# Patient Record
Sex: Male | Born: 1955 | Race: White | Hispanic: No | Smoking: Never smoker
Health system: Southern US, Community
[De-identification: ages and names within clinical notes are randomized; demographics above are authoritative.]

## PROBLEM LIST (undated history)

## (undated) DIAGNOSIS — E119 Type 2 diabetes mellitus without complications: Secondary | ICD-10-CM

## (undated) DIAGNOSIS — I10 Essential (primary) hypertension: Secondary | ICD-10-CM

## (undated) DIAGNOSIS — Z9989 Dependence on other enabling machines and devices: Secondary | ICD-10-CM

## (undated) DIAGNOSIS — G4733 Obstructive sleep apnea (adult) (pediatric): Secondary | ICD-10-CM

## (undated) DIAGNOSIS — E876 Hypokalemia: Secondary | ICD-10-CM

## (undated) DIAGNOSIS — N182 Chronic kidney disease, stage 2 (mild): Secondary | ICD-10-CM

## (undated) DIAGNOSIS — K219 Gastro-esophageal reflux disease without esophagitis: Secondary | ICD-10-CM

## (undated) DIAGNOSIS — E559 Vitamin D deficiency, unspecified: Secondary | ICD-10-CM

## (undated) DIAGNOSIS — E785 Hyperlipidemia, unspecified: Secondary | ICD-10-CM

## (undated) HISTORY — DX: Hypomagnesemia: E83.42

## (undated) HISTORY — DX: Chronic kidney disease, stage 2 (mild): N18.2

## (undated) HISTORY — DX: Type 2 diabetes mellitus without complications: E11.9

## (undated) HISTORY — DX: Dependence on other enabling machines and devices: Z99.89

## (undated) HISTORY — DX: Vitamin D deficiency, unspecified: E55.9

## (undated) HISTORY — DX: Hypokalemia: E87.6

## (undated) HISTORY — DX: Essential (primary) hypertension: I10

## (undated) HISTORY — PX: TONSILLECTOMY: SUR1361

## (undated) HISTORY — DX: Obstructive sleep apnea (adult) (pediatric): G47.33

## (undated) HISTORY — DX: Hyperlipidemia, unspecified: E78.5

## (undated) HISTORY — DX: Gastro-esophageal reflux disease without esophagitis: K21.9

---

## 1985-03-03 DIAGNOSIS — E119 Type 2 diabetes mellitus without complications: Secondary | ICD-10-CM

## 1985-03-03 HISTORY — DX: Type 2 diabetes mellitus without complications: E11.9

## 2000-05-28 ENCOUNTER — Ambulatory Visit (HOSPITAL_BASED_OUTPATIENT_CLINIC_OR_DEPARTMENT_OTHER): Admission: RE | Admit: 2000-05-28 | Discharge: 2000-05-28 | Payer: Self-pay | Admitting: Otolaryngology

## 2000-10-15 ENCOUNTER — Encounter: Payer: Self-pay | Admitting: General Surgery

## 2000-10-16 ENCOUNTER — Observation Stay (HOSPITAL_COMMUNITY): Admission: RE | Admit: 2000-10-16 | Discharge: 2000-10-17 | Payer: Self-pay | Admitting: General Surgery

## 2005-03-03 HISTORY — PX: ANAL FISSURE REPAIR: SHX2312

## 2005-03-10 ENCOUNTER — Encounter: Admission: RE | Admit: 2005-03-10 | Discharge: 2005-03-10 | Payer: Self-pay | Admitting: Endocrinology

## 2011-11-18 ENCOUNTER — Ambulatory Visit (HOSPITAL_COMMUNITY)
Admission: RE | Admit: 2011-11-18 | Discharge: 2011-11-18 | Disposition: A | Discharge status: Home / Self Care | Payer: PRIVATE HEALTH INSURANCE | Source: Ambulatory Visit | Attending: Internal Medicine | Admitting: Internal Medicine

## 2011-11-18 ENCOUNTER — Other Ambulatory Visit (HOSPITAL_COMMUNITY): Payer: Self-pay | Admitting: Internal Medicine

## 2011-11-18 DIAGNOSIS — IMO0002 Reserved for concepts with insufficient information to code with codable children: Secondary | ICD-10-CM

## 2011-11-18 DIAGNOSIS — R109 Unspecified abdominal pain: Secondary | ICD-10-CM | POA: Insufficient documentation

## 2011-11-18 DIAGNOSIS — M25559 Pain in unspecified hip: Secondary | ICD-10-CM | POA: Insufficient documentation

## 2011-12-29 ENCOUNTER — Ambulatory Visit (INDEPENDENT_AMBULATORY_CARE_PROVIDER_SITE_OTHER): Payer: PRIVATE HEALTH INSURANCE | Admitting: Surgery

## 2012-02-04 ENCOUNTER — Ambulatory Visit (INDEPENDENT_AMBULATORY_CARE_PROVIDER_SITE_OTHER): Payer: PRIVATE HEALTH INSURANCE | Admitting: Surgery

## 2012-02-04 ENCOUNTER — Encounter (INDEPENDENT_AMBULATORY_CARE_PROVIDER_SITE_OTHER): Payer: Self-pay | Admitting: Surgery

## 2012-02-04 VITALS — BP 124/82 | HR 61 | Temp 97.6°F | Ht 73.5 in | Wt 263.4 lb

## 2012-02-04 DIAGNOSIS — M6208 Separation of muscle (nontraumatic), other site: Secondary | ICD-10-CM

## 2012-02-04 DIAGNOSIS — E669 Obesity, unspecified: Secondary | ICD-10-CM

## 2012-02-04 DIAGNOSIS — K439 Ventral hernia without obstruction or gangrene: Secondary | ICD-10-CM

## 2012-02-04 NOTE — Patient Instructions (Signed)
See the Handout(s) we gave you.  Consider surgery.  Please call our office at [phone] if you wish to schedule surgery or if you have further questions / concerns.   Hernia A hernia occurs when an internal organ pushes out through a weak spot in the abdominal wall. Hernias most commonly occur in the groin and around the navel. Hernias often can be pushed back into place (reduced). Most hernias tend to get worse over time. Some abdominal hernias can get stuck in the opening (irreducible or incarcerated hernia) and cannot be reduced. An irreducible abdominal hernia which is tightly squeezed into the opening is at risk for impaired blood supply (strangulated hernia). A strangulated hernia is a medical emergency. Because of the risk for an irreducible or strangulated hernia, surgery may be recommended to repair a hernia. CAUSES   Heavy lifting.  Prolonged coughing.  Straining to have a bowel movement.  A cut (incision) made during an abdominal surgery. HOME CARE INSTRUCTIONS   Bed rest is not required. You may continue your normal activities.  Avoid lifting more than 10 pounds (4.5 kg) or straining.  Cough gently. If you are a smoker it is best to stop. Even the best hernia repair can break down with the continual strain of coughing. Even if you do not have your hernia repaired, a cough will continue to aggravate the problem.  Do not wear anything tight over your hernia. Do not try to keep it in with an outside bandage or truss. These can damage abdominal contents if they are trapped within the hernia sac.  Eat a normal diet.  Avoid constipation. Straining over long periods of time will increase hernia size and encourage breakdown of repairs. If you cannot do this with diet alone, stool softeners may be used. SEEK IMMEDIATE MEDICAL CARE IF:   You have a fever.  You develop increasing abdominal pain.  You feel nauseous or vomit.  Your hernia is stuck outside the abdomen, looks  discolored, feels hard, or is tender.  You have any changes in your bowel habits or in the hernia that are unusual for you.  You have increased pain or swelling around the hernia.  You cannot push the hernia back in place by applying gentle pressure while lying down. MAKE SURE YOU:   Understand these instructions.  Will watch your condition.  Will get help right away if you are not doing well or get worse. Document Released: 02/17/2005 Document Revised: 05/12/2011 Document Reviewed: 10/07/2007 [employer] Patient Information 2013 [employer].  

## 2012-02-04 NOTE — Progress Notes (Signed)
Subjective:     Patient ID: Harry [name], male   DOB: [birth year], 56 y.o.   MRN: [id number]  HPI  ILYAS LIPSITZ  03/01/1956 [id number]  Patient Care Team: Lucky Cowboy, MD as PCP - General (Internal Medicine)  This patient is a 56 y.o.male who presents today for surgical evaluation at the request of Dr. Oneta Rack.   Reason for visit: Periumbilical swelling.  Probable hernia.  Chronic left groin pain.  Pleasant obese insulin requiring diabetic male.  Has noticed a lump near his bellybutton for the past four years.  It has gradually gotten larger.  Occasionally uncomfortable and sensitive.  Normally has a bowel movement every day.  Can walk over a half-hour without much difficulty.  No prior abdominal surgeries are hernia surgeries.  Because it was getting larger and more symptomatic, he brought it up to his primary care physician.  Dr. Oneta Rack requested surgical evaluation.  Patient also has some left scrotal pain ever since his vasectomy 20 years ago.  Usually worse after sexual activity.  Then it calms down.  Not horribly severe.  Denies any right groin pain.  No personal nor family history of GI/colon cancer, inflammatory bowel disease, irritable bowel syndrome, allergy such as Celiac Sprue, dietary/dairy problems, colitis, ulcers nor gastritis.  No recent sick contacts/gastroenteritis.  No travel outside the country.  No changes in diet.    Patient Active Problem List  Diagnosis  . Ventral hernia - periumbilical 2-3cm  . Diastasis recti  . Obesity (BMI 30-39.9)  . Diabetes mellitus without complication    Past Medical History  Diagnosis Date  . Diabetes mellitus without complication     insulin requiring  . Hyperlipidemia   . Hypertension     Past Surgical History  Procedure Date  . Anal fissure repair 2007    History   Social History  . Marital Status: Married    Spouse Name: N/A    Number of Children: N/A  . Years of Education: N/A   Occupational History  .  Not on file.   Social History Main Topics  . Smoking status: Never Smoker   . Smokeless tobacco: Not on file  . Alcohol Use: 0.0 oz/week    2-5 Cans of beer per week     Comment: weekly  . Drug Use: No  . Sexually Active:    Other Topics Concern  . Not on file   Social History Narrative  . No narrative on file    Family History  Problem Relation Age of Onset  . Cancer Father     kidney    Current Outpatient Prescriptions  Medication Sig Dispense Refill  . aspirin 81 MG tablet Take 81 mg by mouth daily.      Marland Kitchen atorvastatin (LIPITOR) 40 MG tablet Take 40 mg by mouth daily.      . bisoprolol-hydrochlorothiazide (ZIAC) 5-6.25 MG per tablet Take 1 tablet by mouth daily.      . insulin glargine (LANTUS) 100 UNIT/ML injection Inject into the skin at bedtime.      . Insulin Lispro, Human, (HUMALOG PEN [place]) Inject into the skin.      Marland Kitchen losartan-hydrochlorothiazide (HYZAAR) 100-25 MG per tablet Take 1 tablet by mouth daily.      . meloxicam (MOBIC) 15 MG tablet Take 15 mg by mouth daily.      . metFORMIN (GLUCOPHAGE) 500 MG tablet Take 500 mg by mouth 3 (three) times daily.      Marland Kitchen omeprazole (PRILOSEC) 20  MG capsule Take 20 mg by mouth daily.      . tadalafil (CIALIS) 5 MG tablet Take 5 mg by mouth daily as needed.         No Known Allergies  BP 124/82  Pulse 61  Temp 97.6 F (36.4 C) (Temporal)  Ht 6' 1.5" (1.867 m)  Wt 263 lb 6.4 oz (119.477 kg)  BMI 34.28 kg/m2  SpO2 98%  No results found.   Review of Systems  Constitutional: Negative for fever, chills and diaphoresis.  HENT: Negative for nosebleeds, sore throat, facial swelling, mouth sores, trouble swallowing and ear discharge.   Eyes: Negative for photophobia, discharge and visual disturbance.  Respiratory: Negative for choking, chest tightness, shortness of breath and stridor.   Cardiovascular: Negative for chest pain and palpitations.  Gastrointestinal: Negative for nausea, vomiting, abdominal pain, diarrhea,  constipation, blood in stool, abdominal distention, anal bleeding and rectal pain.  Genitourinary: Negative for dysuria, urgency, difficulty urinating and testicular pain.  Musculoskeletal: Negative for myalgias, back pain, arthralgias and gait problem.  Skin: Negative for color change, pallor, rash and wound.  Neurological: Negative for dizziness, speech difficulty, weakness, numbness and headaches.  Hematological: Negative for adenopathy. Does not bruise/bleed easily.  Psychiatric/Behavioral: Negative for hallucinations, confusion and agitation.       Objective:   Physical Exam  Constitutional: He is oriented to person, place, and time. He appears well-developed and well-nourished. No distress.  HENT:  Head: Normocephalic.  Mouth/Throat: Oropharynx is clear and moist. No oropharyngeal exudate.  Eyes: Conjunctivae normal and EOM are normal. Pupils are equal, round, and reactive to light. No scleral icterus.  Neck: Normal range of motion. Neck supple. No tracheal deviation present.  Cardiovascular: Normal rate, regular rhythm and intact distal pulses.   Pulmonary/Chest: Effort normal and breath sounds normal. No respiratory distress.  Abdominal: Soft. He exhibits no distension. There is no tenderness. There is no rigidity, no guarding, no CVA tenderness, no tenderness at McBurney's point and negative Murphy's sign. A hernia is present. Hernia confirmed positive in the ventral area. Hernia confirmed negative in the right inguinal area and confirmed negative in the left inguinal area.         OBESE  Genitourinary:     Musculoskeletal: Normal range of motion. He exhibits no tenderness.  Lymphadenopathy:    He has no cervical adenopathy.       Right: No inguinal adenopathy present.       Left: No inguinal adenopathy present.  Neurological: He is alert and oriented to person, place, and time. No cranial nerve deficit. He exhibits normal muscle tone. Coordination normal.  Skin: Skin is  warm and dry. No rash noted. He is not diaphoretic. No erythema. No pallor.  Psychiatric: He has a normal mood and affect. His behavior is normal. Judgment and thought content normal.       Assessment:     Periumbilical VWH in setting of diastasis recti    Plan:     I think given his obesity, diabetes, and diastases recti, he requires mesh reinforcement of this repair.  Prefer to do a laparoscopic technique to better evaluate and allow the mesh to lay well.  I doubt a large sheet will be needed.  Small enough that can be done in an outpatient setting.  He is interested in proceeding.  I discussed the procedure with him:  The anatomy & physiology of the abdominal wall was discussed.  The pathophysiology of hernias was discussed.  Natural history risks without  surgery including progeressive enlargement, pain, incarceration & strangulation was discussed.   Contributors to complications such as smoking, obesity, diabetes, prior surgery, etc were discussed.   I feel the risks of no intervention will lead to serious problems that outweigh the operative risks; therefore, I recommended surgery to reduce and repair the hernia.  I explained laparoscopic techniques with possible need for an open approach.  I noted the probable use of mesh to patch and/or buttress the hernia repair  Risks such as bleeding, infection, abscess, need for further treatment, heart attack, death, and other risks were discussed.  I noted a good likelihood this will help address the problem.   Goals of post-operative recovery were discussed as well.  Possibility that this will not correct all symptoms was explained.  I stressed the importance of low-impact activity, aggressive pain control, avoiding constipation, & not pushing through pain to minimize risk of post-operative chronic pain or injury. Possibility of reherniation especially with smoking, obesity, diabetes, immunosuppression, and other health conditions was discussed.  We  will work to minimize complications.     An educational handout further explaining the pathology & treatment options was given as well.  Questions were answered.  The patient expresses understanding & wishes to proceed with surgery.

## 2012-02-23 ENCOUNTER — Encounter (INDEPENDENT_AMBULATORY_CARE_PROVIDER_SITE_OTHER): Payer: Self-pay

## 2012-03-22 ENCOUNTER — Encounter (INDEPENDENT_AMBULATORY_CARE_PROVIDER_SITE_OTHER): Payer: Self-pay

## 2012-04-13 DIAGNOSIS — K429 Umbilical hernia without obstruction or gangrene: Secondary | ICD-10-CM

## 2012-04-13 HISTORY — PX: VENTRAL HERNIA REPAIR: SHX424

## 2012-04-14 ENCOUNTER — Telehealth (INDEPENDENT_AMBULATORY_CARE_PROVIDER_SITE_OTHER): Payer: Self-pay | Admitting: General Surgery

## 2012-04-14 NOTE — Telephone Encounter (Signed)
Spoke with Mrs Chick Cousins was asleep i gave his follow up appt for 05/11/12 . Wife states he is doing great .

## 2012-04-20 ENCOUNTER — Telehealth (INDEPENDENT_AMBULATORY_CARE_PROVIDER_SITE_OTHER): Payer: Self-pay | Admitting: *Deleted

## 2012-04-20 NOTE — Telephone Encounter (Signed)
Wife called to ask for refill of pain medication.  This RN called [facility] @ [phone]; Norco 5/325mg  1-2tabs every 4-6 hours as needed for pain #30 no refills.  Wife made aware prescription was called in and will pick up.

## 2012-05-11 ENCOUNTER — Encounter (INDEPENDENT_AMBULATORY_CARE_PROVIDER_SITE_OTHER): Payer: Self-pay | Admitting: Surgery

## 2012-05-11 ENCOUNTER — Ambulatory Visit (INDEPENDENT_AMBULATORY_CARE_PROVIDER_SITE_OTHER): Payer: PRIVATE HEALTH INSURANCE | Admitting: Surgery

## 2012-05-11 VITALS — BP 106/70 | HR 60 | Temp 97.2°F | Resp 18 | Ht 74.0 in | Wt 262.6 lb

## 2012-05-11 DIAGNOSIS — K439 Ventral hernia without obstruction or gangrene: Secondary | ICD-10-CM

## 2012-05-11 DIAGNOSIS — M6200 Separation of muscle (nontraumatic), unspecified site: Secondary | ICD-10-CM

## 2012-05-11 DIAGNOSIS — M6208 Separation of muscle (nontraumatic), other site: Secondary | ICD-10-CM

## 2012-05-11 DIAGNOSIS — E669 Obesity, unspecified: Secondary | ICD-10-CM

## 2012-05-11 NOTE — Progress Notes (Signed)
Subjective:     Patient ID: Harry [name], male   DOB: 1956/01/13, 57 y.o.   MRN: [id number]  HPI  Harry [name]  1955-04-21 [id number]  Patient Care Team: Lucky Cowboy, MD as PCP - General (Internal Medicine)  This patient is a 57 y.o.male who presents today for surgical evaluation Status post laparoscopic repair periumbilical ventral hernia 04/22/2012  The patient comes in today feeling well.  Off pain medications.  Little sensitivity around his bellybutton but not severe.  In good spirits.  No fevers or chills.  Getting out and doing more.  Patient Active Problem List  Diagnosis  . Ventral hernia - periumbilical 2-3cm  . Diastasis recti  . Obesity (BMI 30-39.9)  . Diabetes mellitus without complication    Past Medical History  Diagnosis Date  . Diabetes mellitus without complication     insulin requiring  . Hyperlipidemia   . Hypertension     Past Surgical History  Procedure Laterality Date  . Anal fissure repair  2007  . Ventral hernia repair  04/13/12    Dr. Michaell Cowing, ventral hernia repair w/ mesh    History   Social History  . Marital Status: Married    Spouse Name: N/A    Number of Children: N/A  . Years of Education: N/A   Occupational History  . Not on file.   Social History Main Topics  . Smoking status: Never Smoker   . Smokeless tobacco: Not on file  . Alcohol Use: 0.0 oz/week    2-5 Cans of beer per week     Comment: weekly  . Drug Use: No  . Sexually Active: Not on file   Other Topics Concern  . Not on file   Social History Narrative  . No narrative on file    Family History  Problem Relation Age of Onset  . Cancer Father     kidney    Current Outpatient Prescriptions  Medication Sig Dispense Refill  . aspirin 81 MG tablet Take 81 mg by mouth daily.      Marland Kitchen atorvastatin (LIPITOR) 40 MG tablet Take 40 mg by mouth daily.      . B-D INS SYRINGE 0.5CC/31GX5/16 31G X 5/16" 0.5 ML MISC       . bisoprolol-hydrochlorothiazide (ZIAC)  5-6.25 MG per tablet Take 1 tablet by mouth daily.      . insulin glargine (LANTUS) 100 UNIT/ML injection Inject into the skin at bedtime.      . Insulin Lispro, Human, (HUMALOG PEN [place]) Inject into the skin.      Marland Kitchen losartan-hydrochlorothiazide (HYZAAR) 100-25 MG per tablet Take 1 tablet by mouth daily.      . meloxicam (MOBIC) 15 MG tablet Take 15 mg by mouth daily.      . metFORMIN (GLUCOPHAGE) 500 MG tablet Take 500 mg by mouth 3 (three) times daily.      Marland Kitchen omeprazole (PRILOSEC) 20 MG capsule Take 20 mg by mouth daily.      . ONE TOUCH ULTRA TEST test strip       . tadalafil (CIALIS) 5 MG tablet Take 5 mg by mouth daily as needed.       No current facility-administered medications for this visit.     No Known Allergies  BP 106/70  Pulse 60  Temp(Src) 97.2 F (36.2 C) (Oral)  Resp 18  Ht 6\' 2"  (1.88 m)  Wt 262 lb 9.6 oz (119.115 kg)  BMI 33.7 kg/m2  No results found.  Review of Systems  Constitutional: Negative for fever, chills and diaphoresis.  HENT: Negative for sore throat, trouble swallowing and neck pain.   Eyes: Negative for photophobia and visual disturbance.  Respiratory: Negative for choking and shortness of breath.   Cardiovascular: Negative for chest pain and palpitations.  Gastrointestinal: Negative for nausea, vomiting, abdominal distention, anal bleeding and rectal pain.  Genitourinary: Negative for dysuria, urgency, difficulty urinating and testicular pain.  Musculoskeletal: Negative for myalgias, arthralgias and gait problem.  Skin: Negative for color change and rash.  Neurological: Negative for dizziness, speech difficulty, weakness and numbness.  Hematological: Negative for adenopathy.  Psychiatric/Behavioral: Negative for hallucinations, confusion and agitation.       Objective:   Physical Exam  Constitutional: He is oriented to person, place, and time. He appears well-developed and well-nourished. No distress.  HENT:  Head: Normocephalic.   Mouth/Throat: Oropharynx is clear and moist. No oropharyngeal exudate.  Eyes: Conjunctivae and EOM are normal. Pupils are equal, round, and reactive to light. No scleral icterus.  Neck: Normal range of motion. No tracheal deviation present.  Cardiovascular: Normal rate, normal heart sounds and intact distal pulses.   Pulmonary/Chest: Effort normal. No respiratory distress.  Abdominal: Soft. He exhibits no distension. There is no tenderness. Hernia confirmed negative in the right inguinal area and confirmed negative in the left inguinal area.  Incisions clean with normal healing ridges.  No hernias  Musculoskeletal: Normal range of motion. He exhibits no tenderness.  Neurological: He is alert and oriented to person, place, and time. No cranial nerve deficit. He exhibits normal muscle tone. Coordination normal.  Skin: Skin is warm and dry. No rash noted. He is not diaphoretic.  Psychiatric: He has a normal mood and affect. His behavior is normal.       Assessment:     Three weeks status post repair of ventral hernia recovering well.     Plan:     Increase activity as tolerated to regular activity.  Do not push through pain.  Diet as tolerated. Bowel regimen to avoid problems.  Return to clinic p.r.n.   Instructions discussed.  Followup with primary care physician for other health issues as would normally be done.  Questions answered.  The patient expressed understanding and appreciation

## 2012-05-11 NOTE — Patient Instructions (Addendum)
HERNIA REPAIR: POST OP INSTRUCTIONS  1. DIET: Follow a light bland diet the first 24 hours after arrival home, such as soup, liquids, crackers, etc.  Be sure to include lots of fluids daily.  Avoid fast food or heavy meals as your are more likely to get nauseated.  Eat a low fat the next few days after surgery. 2. Take your usually prescribed home medications unless otherwise directed. 3. PAIN CONTROL: a. Pain is best controlled by a usual combination of three different methods TOGETHER: i. Ice/Heat ii. Over the counter pain medication iii. Prescription pain medication b. Most patients will experience some swelling and bruising around the hernia(s) such as the bellybutton, groins, or old incisions.  Ice packs or heating pads (30-60 minutes up to 6 times a day) will help. Use ice for the first few days to help decrease swelling and bruising, then switch to heat to help relax tight/sore spots and speed recovery.  Some people prefer to use ice alone, heat alone, alternating between ice & heat.  Experiment to what works for you.  Swelling and bruising can take several weeks to resolve.   c. It is helpful to take an over-the-counter pain medication regularly for the first few weeks.  Choose one of the following that works best for you: i. Naproxen (Aleve, etc)  Two 220mg tabs twice a day ii. Ibuprofen (Advil, etc) Three 200mg tabs four times a day (every meal & bedtime) iii. Acetaminophen (Tylenol, etc) 325-650mg four times a day (every meal & bedtime) d. A  prescription for pain medication should be given to you upon discharge.  Take your pain medication as prescribed.  i. If you are having problems/concerns with the prescription medicine (does not control pain, nausea, vomiting, rash, itching, etc), please call us [phone] to see if we need to switch you to a different pain medicine that will work better for you and/or control your side effect better. ii. If you need a refill on your pain  medication, please contact your pharmacy.  They will contact our office to request authorization. Prescriptions will not be filled after 5 pm or on week-ends. 4. Avoid getting constipated.  Between the surgery and the pain medications, it is common to experience some constipation.  Increasing fluid intake and taking a fiber supplement (such as Metamucil, Citrucel, FiberCon, MiraLax, etc) 1-2 times a day regularly will usually help prevent this problem from occurring.  A mild laxative (prune juice, Milk of Magnesia, MiraLax, etc) should be taken according to package directions if there are no bowel movements after 48 hours.   5. Wash / shower every day.  You may shower over the dressings as they are waterproof.   6. Remove your waterproof bandages 5 days after surgery.  You may leave the incision open to air.  You may replace a dressing/Band-Aid to cover the incision for comfort if you wish.  Continue to shower over incision(s) after the dressing is off.    7. ACTIVITIES as tolerated:   a. You may resume regular (light) daily activities beginning the next day-such as daily self-care, walking, climbing stairs-gradually increasing activities as tolerated.  If you can walk 30 minutes without difficulty, it is safe to try more intense activity such as jogging, treadmill, bicycling, low-impact aerobics, swimming, etc. b. Save the most intensive and strenuous activity for last such as sit-ups, heavy lifting, contact sports, etc  Refrain from any heavy lifting or straining until you are off narcotics for pain control.     c. DO NOT PUSH THROUGH PAIN.  Let pain be your guide: If it hurts to do something, don't do it.  Pain is your body warning you to avoid that activity for another week until the pain goes down. d. You may drive when you are no longer taking prescription pain medication, you can comfortably wear a seatbelt, and you can safely maneuver your car and apply brakes. e. You may have sexual intercourse  when it is comfortable.  8. FOLLOW UP in our office a. Please call [facility] at [phone] to set up an appointment to see your surgeon in the office for a follow-up appointment approximately 2-3 weeks after your surgery. b. Make sure that you call for this appointment the day you arrive home to insure a convenient appointment time. 9.  IF YOU HAVE DISABILITY OR FAMILY LEAVE FORMS, BRING THEM TO THE OFFICE FOR PROCESSING.  DO NOT GIVE THEM TO YOUR DOCTOR.  WHEN TO CALL US [phone]: 1. Poor pain control 2. Reactions / problems with new medications (rash/itching, nausea, etc)  3. Fever over 101.5 F (38.5 C) 4. Inability to urinate 5. Nausea and/or vomiting 6. Worsening swelling or bruising 7. Continued bleeding from incision. 8. Increased pain, redness, or drainage from the incision   The clinic staff is available to answer your questions during regular business hours (8:30am-5pm).  Please don't hesitate to call and ask to speak to one of our nurses for clinical concerns.   If you have a medical emergency, go to the nearest emergency room or call 911.  A surgeon from [facility] is always on call at the hospitals in [place]  [facility] [address], [address], [place], [place]  [address] ?  [address] [address], [place], [place]   [address] MAIN: [phone] ? TOLL FREE: [phone] ? FAX: [phone] [email or web address]  Managing Pain  Pain after surgery or related to activity is often due to strain/injury to muscle, tendon, nerves and/or incisions.  This pain is usually short-term and will improve in a few months.   Many people find it helpful to do the following things TOGETHER to help speed the process of healing and to get back to regular activity more quickly:  1. Avoid heavy physical activity a.  no lifting greater than 20 pounds b. Do not "push through" the pain.  Listen to your body and avoid positions and maneuvers than  reproduce the pain c. Walking is okay as tolerated, but go slowly and stop when getting sore.  d. Remember: If it hurts to do it, then don't do it! 2. Take Anti-inflammatory medication  a. Take with food/snack around the clock for 1-2 weeks i. This helps the muscle and nerve tissues become less irritable and calm down faster b. Choose ONE of the following over-the-counter medications: i. Naproxen 220mg tabs (ex. Aleve) 1-2 pills twice a day  ii. Ibuprofen 200mg tabs (ex. Advil, Motrin) 3-4 pills with every meal and just before bedtime iii. Acetaminophen 500mg tabs (Tylenol) 1-2 pills with every meal and just before bedtime 3. Use a Heating pad or Ice/Cold Pack a. 4-6 times a day b. May use warm bath/hottub  or showers 4. Try Gentle Massage and/or Stretching  a. at the area of pain many times a day b. stop if you feel pain - do not overdo it  Try these steps together to help you body heal faster and avoid making things get worse.  Doing just one of these   things may not be enough.    If you are not getting better after two weeks or are noticing you are getting worse, contact our office for further advice; we may need to re-evaluate you & see what other things we can do to help.   

## 2012-12-29 ENCOUNTER — Encounter: Payer: Self-pay | Admitting: Internal Medicine

## 2013-01-06 ENCOUNTER — Encounter (INDEPENDENT_AMBULATORY_CARE_PROVIDER_SITE_OTHER): Payer: 59 | Admitting: General Surgery

## 2013-01-07 ENCOUNTER — Ambulatory Visit: Payer: 59 | Admitting: Internal Medicine

## 2013-01-07 ENCOUNTER — Encounter: Payer: Self-pay | Admitting: Internal Medicine

## 2013-01-07 VITALS — BP 124/76 | HR 64 | Temp 98.2°F | Resp 18 | Ht 74.0 in | Wt 255.2 lb

## 2013-01-07 DIAGNOSIS — E109 Type 1 diabetes mellitus without complications: Secondary | ICD-10-CM

## 2013-01-07 DIAGNOSIS — I10 Essential (primary) hypertension: Secondary | ICD-10-CM

## 2013-01-07 DIAGNOSIS — E782 Mixed hyperlipidemia: Secondary | ICD-10-CM

## 2013-01-07 DIAGNOSIS — Z79899 Other long term (current) drug therapy: Secondary | ICD-10-CM

## 2013-01-07 DIAGNOSIS — E559 Vitamin D deficiency, unspecified: Secondary | ICD-10-CM

## 2013-01-07 LAB — CBC WITH DIFFERENTIAL/PLATELET
Eosinophils Absolute: 0.1 10*3/uL (ref 0.0–0.7)
Eosinophils Relative: 3 % (ref 0–5)
HCT: 44.2 % (ref 39.0–52.0)
Hemoglobin: 15.5 g/dL (ref 13.0–17.0)
Lymphocytes Relative: 24 % (ref 12–46)
Lymphs Abs: 1.3 10*3/uL (ref 0.7–4.0)
MCH: 28.4 pg (ref 26.0–34.0)
MCV: 81.1 fL (ref 78.0–100.0)
Monocytes Absolute: 0.5 10*3/uL (ref 0.1–1.0)
Monocytes Relative: 10 % (ref 3–12)
RBC: 5.45 MIL/uL (ref 4.22–5.81)

## 2013-01-07 LAB — LIPID PANEL
Cholesterol: 166 mg/dL (ref 0–200)
LDL Cholesterol: 94 mg/dL (ref 0–99)
VLDL: 27 mg/dL (ref 0–40)

## 2013-01-07 LAB — BASIC METABOLIC PANEL WITH GFR
BUN: 16 mg/dL (ref 6–23)
CO2: 36 mEq/L — ABNORMAL HIGH (ref 19–32)
Calcium: 9.6 mg/dL (ref 8.4–10.5)
Creat: 1.01 mg/dL (ref 0.50–1.35)

## 2013-01-07 LAB — HEMOGLOBIN A1C
Hgb A1c MFr Bld: 8.9 % — ABNORMAL HIGH (ref <5.7)
Mean Plasma Glucose: 209 mg/dL — ABNORMAL HIGH (ref <117)

## 2013-01-07 LAB — HEPATIC FUNCTION PANEL
ALT: 28 U/L (ref 0–53)
Bilirubin, Direct: 0.2 mg/dL (ref 0.0–0.3)
Indirect Bilirubin: 0.7 mg/dL (ref 0.0–0.9)
Total Bilirubin: 0.9 mg/dL (ref 0.3–1.2)

## 2013-01-07 LAB — TSH: TSH: 2.474 u[IU]/mL (ref 0.350–4.500)

## 2013-01-07 MED ORDER — TAMSULOSIN HCL 0.4 MG PO CAPS: 0.4000 mg | ORAL_CAPSULE | Freq: Every day | ORAL | Status: DC

## 2013-01-07 NOTE — Progress Notes (Signed)
Patient ID: Harry [name], male   DOB: 1955/12/04, 57 y.o.   MRN: [id number] HPI: Patient presents for 3 month follow up with hypertension, hyperlipidemia, pre-diabetes and vitamin D deficiency.   BP has been controlled at home. Today's BP is 124/76 and home BP's have all been normal. Patient denies any cardiac type chest pain, palpitations, dyspnea/orthopnea/PND, dizziness, claudication, or dependent edema.  Hyperlipidemia is controlled with diet & meds. Patient denies myalgias or other med SE's. Last cholesterol was  158, trig 158, and LDL 82 @ goal.  Also, patient has history of IDDM with last A1c of  7.9%.  FBG's range 125-175 mg% and afternoon glucoses range 90-200 mg%.Patient denies any visual blurring, hypoglycemia, diabetic polys or paresthesias.   Further, Patient has history of vitamin D deficiency (last Vit D = 52) with pt supplementing same at 6-8,000 u daily   Lastly, patient reports symptoms of prostatism especially in early am about 4-5 am with hesistancy and desires to try medication for benefit.    Current outpatient prescriptions:aspirin 81 MG tablet, Take 81 mg by mouth daily., Disp: , Rfl: ;  atorvastatin (LIPITOR) 40 MG tablet, Take 40 mg by mouth daily., Disp: , Rfl: ;  B-D INS SYRINGE 0.5CC/31GX5/16 31G X 5/16" 0.5 ML MISC, , Disp: , Rfl: ;  bisoprolol-hydrochlorothiazide (ZIAC) 5-6.25 MG per tablet, Take 1 tablet by mouth daily., Disp: , Rfl:  insulin glargine (LANTUS) 100 UNIT/ML injection, Inject 60 Units into the skin at bedtime. , Disp: , Rfl: ;  Insulin Lispro, Human, (HUMALOG PEN [place]), Inject 50 Units into the skin daily. , Disp: , Rfl: ;  losartan-hydrochlorothiazide (HYZAAR) 100-25 MG per tablet, Take 1 tablet by mouth daily., Disp: , Rfl: ;  meloxicam (MOBIC) 15 MG tablet, Take 15 mg by mouth daily., Disp: , Rfl:  metFORMIN (GLUCOPHAGE) 500 MG tablet, Take 500 mg by mouth 3 (three) times daily., Disp: , Rfl: ;  omeprazole (PRILOSEC) 20 MG capsule, Take 20 mg by mouth  daily., Disp: , Rfl: ;  ONE TOUCH ULTRA TEST test strip, , Disp: , Rfl: ;  tadalafil (CIALIS) 5 MG tablet, Take 5 mg by mouth daily as needed., Disp: , Rfl: ;  tamsulosin (FLOMAX) 0.4 MG CAPS capsule, Take 1 capsule (0.4 mg total) by mouth daily. For prostate, Disp: 30 capsule, Rfl: 99  Crestor Systems Review: Constitutional: Denies fever, chills, wt changes, headaches, insomnia, fatigue, night sweats, change in appetite. Eyes: Denies redness, blurred vision, diplopia, discharge, itchy, watery eyes.  ENT: Denies discharge, congestion, post nasal drip, epistaxis, sore throat, earache, hearing loss, dental pain, Tinnitus, Vertigo, Sinus pain, snoring.  CV: Denies chest pain, palpitations, irregular heartbeat, syncope, dyspnea, diaphoresis, orthopnea, PND, claudication, edema Respiratory: denies cough, dyspnea, DOE, pleurisy, hoarseness, laryngitis, wheezing.  Gastrointestinal: Denies dysphagia, odynophagia, heartburn, reflux, water brash, pain, cramps, nausea, vomiting, bloating, diarrhea, constipation, hematemesis, melena, hematochezia, jaundice, hemorrhoids Genitourinary: Denies dysuria, frequency, urgency, nocturia, hesitancy, discharge, hematuria, flank pain Musculoskeletal: Denies arthralgias, myalgias, stiffness, Jt. Swelling, pain, limp, strain/sprain.  Skin: Denies pruritus, rash, hives, warts, acne, eczema, change in skin lesion(s) Neuro: No weakness, tremor, incoordination, spasms, paresthesia, pain Psychiatric: Denies confusion, memory loss, sensory loss Endo: Denies change in weight, skin, hair change, nocturia, diabetic polys, paresthesias, visual blurring, hyper/hypo-glycemic episodes.  Heme/Lymph: Excessive bleeding, bruising, enlarged lymph nodes PMHx: FHx: Reviewed / unchanged SHx: Reviewed / unchanged Filed Vitals:   01/07/13 0908  BP: 124/76  Pulse: 64  Temp: 98.2 F (36.8 C)  Resp: 18    On  Exam: Appears well nourished - in NAD. Eyes: PERRLA, EOMs, conjunctiva no  swelling or erythema, normal fundi and vessels. Sinuses: No Frontal/maxillary tenderness ENT/Mouth: EAC's clear, TM's nl w/o erythema, bulging. Nares clear w/o erythema, swelling, exudates. Oropharynx clear. No ulcers, cracking, on lips. Dentition - unremarkable. No erythema. Tongue normal, non obstructing. Hearing intact.  Neck: Supple. Car 2+/2+. Thyroid nl. No LN, bruits or JVD. Chest: Respirations nl, clear  equal BS w/o  rales, rhonchi, wheezing or stridor.  Cardio: Heart sounds normal w/ regular rate and rhythm without sig. murmurs, gallops,clicks, or rubs. Peripheral pulses normal and equal  without edema.  Abdomen: Soft, flat & bowel sounds normal. Non-tender w/o guarding, rebound, hernias, masses, or organomegaly. (last month Dr Matthias Hughs did DRE and advise prostate was OK) Musculoskeletal: Full ROM all peripheral extremities, joint stability, 5/5 strength, and normal gait.  Skin: Warm, dry without rashes, lesions, ecchymosis.  Neuro: Cranial nerves intact, reflexes equal bilaterally. Sensory-motor testing grossly intact. Tendon reflexes grossly intact.  Pysch: Alert & oriented x 3. Insight and judgement nl & appropriate. No ideations.  Assessment and Plan:  1. Hypertension - Continue medication, monitor blood pressure at home. Continue diet/meds.  2. Hyperlipidemia - Continue diet/meds, exercise,& lifestyle modifications. Continue monitor cholesterol/LFT's.   3. Pre-diabetes/Insulin Resistance - Continue diet, exercise, lifestyle modifications. Monitor appropriate labs.   4. Vitamin D Deficiency - Continue supplementation  5. Prostatism

## 2013-01-07 NOTE — Patient Instructions (Signed)
Diet and meds as discussed  Vitamin D Deficiency Vitamin D is an important vitamin that your body needs. Having too little of it in your body is called a deficiency. A very bad deficiency can make your bones soft and can cause a condition called rickets.  Vitamin D is important to your body for different reasons, such as:   It helps your body absorb 2 minerals called calcium and phosphorus.  It helps make your bones healthy.  It may prevent some diseases, such as diabetes and multiple sclerosis.  It helps your muscles and heart. You can get vitamin D in several ways. It is a natural part of some foods. The vitamin is also added to some dairy products and cereals. Some people take vitamin D supplements. Also, your body makes vitamin D when you are in the sun. It changes the sun's rays into a form of the vitamin that your body can use. CAUSES   Not eating enough foods that contain vitamin D.  Not getting enough sunlight.  Having certain digestive system diseases that make it hard to absorb vitamin D. These diseases include Crohn's disease, chronic pancreatitis, and cystic fibrosis.  Having a surgery in which part of the stomach or small intestine is removed.  Being obese. Fat cells pull vitamin D out of your blood. That means that obese people may not have enough vitamin D left in their blood and in other body tissues.  Having chronic kidney or liver disease. RISK FACTORS Risk factors are things that make you more likely to develop a vitamin D deficiency. They include:  Being older.  Not being able to get outside very much.  Living in a nursing home.  Having had broken bones.  Having weak or thin bones (osteoporosis).  Having a disease or condition that changes how your body absorbs vitamin D.  Having dark skin.  Some medicines such as seizure medicines or steroids.  Being overweight or obese. SYMPTOMS Mild cases of vitamin D deficiency may not have any symptoms. If you  have a very bad case, symptoms may include:  Bone pain.  Muscle pain.  Falling often.  Broken bones caused by a minor injury, due to osteoporosis. DIAGNOSIS A blood test is the best way to tell if you have a vitamin D deficiency. TREATMENT Vitamin D deficiency can be treated in different ways. Treatment for vitamin D deficiency depends on what is causing it. Options include:  Taking vitamin D supplements.  Taking a calcium supplement. Your caregiver will suggest what dose is best for you. HOME CARE INSTRUCTIONS  Take any supplements that your caregiver prescribes. Follow the directions carefully. Take only the suggested amount.  Have your blood tested 2 months after you start taking supplements.  Eat foods that contain vitamin D. Healthy choices include:  Fortified dairy products, cereals, or juices. Fortified means vitamin D has been added to the food. Check the label on the package to be sure.  Fatty fish like salmon or trout.  Eggs.  Oysters.  Do not use a tanning bed.  Keep your weight at a healthy level. Lose weight if you need to.  Keep all follow-up appointments. Your caregiver will need to perform blood tests to make sure your vitamin D deficiency is going away. SEEK MEDICAL CARE IF:  You have any questions about your treatment.  You continue to have symptoms of vitamin D deficiency.  You have nausea or vomiting.  You are constipated.  You feel confused.  You have  severe abdominal or back pain. MAKE SURE YOU:  Understand these instructions.  Will watch your condition.  Will get help right away if you are not doing well or get worse. Document Released: 05/12/2011 Document Revised: 06/14/2012 Document Reviewed: 05/12/2011 [facility] Patient Information 2014 [place], [place]. Hypertension As your heart beats, it forces blood through your arteries. This force is your blood pressure. If the pressure is too high, it is called hypertension (HTN) or high  blood pressure. HTN is dangerous because you may have it and not know it. High blood pressure may mean that your heart has to work harder to pump blood. Your arteries may be narrow or stiff. The extra work puts you at risk for heart disease, stroke, and other problems.  Blood pressure consists of two numbers, a higher number over a lower, 110/72, for example. It is stated as "110 over 72." The ideal is below 120 for the top number (systolic) and under 80 for the bottom (diastolic). Write down your blood pressure today. You should pay close attention to your blood pressure if you have certain conditions such as:  Heart failure.  Prior heart attack.  Diabetes  Chronic kidney disease.  Prior stroke.  Multiple risk factors for heart disease. To see if you have HTN, your blood pressure should be measured while you are seated with your arm held at the level of the heart. It should be measured at least twice. A one-time elevated blood pressure reading (especially in the Emergency Department) does not mean that you need treatment. There may be conditions in which the blood pressure is different between your right and left arms. It is important to see your caregiver soon for a recheck. Most people have essential hypertension which means that there is not a specific cause. This type of high blood pressure may be lowered by changing lifestyle factors such as:  Stress.  Smoking.  Lack of exercise.  Excessive weight.  Drug/tobacco/alcohol use.  Eating less salt. Most people do not have symptoms from high blood pressure until it has caused damage to the body. Effective treatment can often prevent, delay or reduce that damage. TREATMENT  When a cause has been identified, treatment for high blood pressure is directed at the cause. There are a large number of medications to treat HTN. These fall into several categories, and your caregiver will help you select the medicines that are best for you.  Medications may have side effects. You should review side effects with your caregiver. If your blood pressure stays high after you have made lifestyle changes or started on medicines,   Your medication(s) may need to be changed.  Other problems may need to be addressed.  Be certain you understand your prescriptions, and know how and when to take your medicine.  Be sure to follow up with your caregiver within the time frame advised (usually within two weeks) to have your blood pressure rechecked and to review your medications.  If you are taking more than one medicine to lower your blood pressure, make sure you know how and at what times they should be taken. Taking two medicines at the same time can result in blood pressure that is too low. SEEK IMMEDIATE MEDICAL CARE IF:  You develop a severe headache, blurred or changing vision, or confusion.  You have unusual weakness or numbness, or a faint feeling.  You have severe chest or abdominal pain, vomiting, or breathing problems. MAKE SURE YOU:   Understand these instructions.  Will watch  your condition.  Will get help right away if you are not doing well or get worse. Document Released: 02/17/2005 Document Revised: 05/12/2011 Document Reviewed: 10/08/2007 [facility] Patient Information 2014 [place], [place]. Diabetes and Exercise Exercising regularly is important. It is not just about losing weight. It has many health benefits, such as:  Improving your overall fitness, flexibility, and endurance.  Increasing your bone density.  Helping with weight control.  Decreasing your body fat.  Increasing your muscle strength.  Reducing stress and tension.  Improving your overall health. People with diabetes who exercise gain additional benefits because exercise:  Reduces appetite.  Improves the body's use of blood sugar (glucose).  Helps lower or control blood glucose.  Decreases blood pressure.  Helps control blood lipids  (such as cholesterol and triglycerides).  Improves the body's use of the hormone insulin by:  Increasing the body's insulin sensitivity.  Reducing the body's insulin needs.  Decreases the risk for heart disease because exercising:  Lowers cholesterol and triglycerides levels.  Increases the levels of good cholesterol (such as high-density lipoproteins [HDL]) in the body.  Lowers blood glucose levels. YOUR ACTIVITY PLAN  Choose an activity that you enjoy and set realistic goals. Your health care provider or diabetes educator can help you make an activity plan that works for you. You can break activities into 2 or 3 sessions throughout the day. Doing so is as good as one long session. Exercise ideas include:  Taking the dog for a walk.  Taking the stairs instead of the elevator.  Dancing to your favorite song.  Doing your favorite exercise with a friend. RECOMMENDATIONS FOR EXERCISING WITH TYPE 1 OR TYPE 2 DIABETES   Check your blood glucose before exercising. If blood glucose levels are greater than 240 mg/dL, check for urine ketones. Do not exercise if ketones are present.  Avoid injecting insulin into areas of the body that are going to be exercised. For example, avoid injecting insulin into:  The arms when playing tennis.  The legs when jogging.  Keep a record of:  Food intake before and after you exercise.  Expected peak times of insulin action.  Blood glucose levels before and after you exercise.  The type and amount of exercise you have done.  Review your records with your health care provider. Your health care provider will help you to develop guidelines for adjusting food intake and insulin amounts before and after exercising.  If you take insulin or oral hypoglycemic agents, watch for signs and symptoms of hypoglycemia. They include:  Dizziness.  Shaking.  Sweating.  Chills.  Confusion.  Drink plenty of water while you exercise to prevent dehydration  or heat stroke. Body water is lost during exercise and must be replaced.  Talk to your health care provider before starting an exercise program to make sure it is safe for you. Remember, almost any type of activity is better than none. Document Released: 05/10/2003 Document Revised: 10/20/2012 Document Reviewed: 07/27/2012 [facility] Patient Information 2014 [place], [place]. Cholesterol Cholesterol is a white, waxy, fat-like protein needed by your body in small amounts. The liver makes all the cholesterol you need. It is carried from the liver by the blood through the blood vessels. Deposits (plaque) may build up on blood vessel walls. This makes the arteries narrower and stiffer. Plaque increases the risk for heart attack and stroke. You cannot feel your cholesterol level even if it is very high. The only way to know is by a blood test to check your  lipid (fats) levels. Once you know your cholesterol levels, you should keep a record of the test results. Work with your caregiver to to keep your levels in the desired range. WHAT THE RESULTS MEAN:  Total cholesterol is a rough measure of all the cholesterol in your blood.  LDL is the so-called bad cholesterol. This is the type that deposits cholesterol in the walls of the arteries. You want this level to be low.  HDL is the good cholesterol because it cleans the arteries and carries the LDL away. You want this level to be high.  Triglycerides are fat that the body can either burn for energy or store. High levels are closely linked to heart disease. DESIRED LEVELS:  Total cholesterol below 200.  LDL below 100 for people at risk, below 70 for very high risk.  HDL above 50 is good, above 60 is best.  Triglycerides below 150. HOW TO LOWER YOUR CHOLESTEROL:  Diet.  Choose fish or white meat chicken and [place], roasted or baked. Limit fatty cuts of red meat, fried foods, and processed meats, such as sausage and lunch meat.  Eat lots of fresh  fruits and vegetables. Choose whole grains, beans, pasta, potatoes and cereals.  Use only small amounts of olive, corn or canola oils. Avoid butter, mayonnaise, shortening or palm kernel oils. Avoid foods with trans-fats.  Use skim/nonfat milk and low-fat/nonfat yogurt and cheeses. Avoid whole milk, cream, ice cream, egg yolks and cheeses. Healthy desserts include angel food cake, ginger snaps, animal crackers, hard candy, popsicles, and low-fat/nonfat frozen yogurt. Avoid pastries, cakes, pies and cookies.  Exercise.  A regular program helps decrease LDL and raises HDL.  Helps with weight control.  Do things that increase your activity level like gardening, walking, or taking the stairs.  Medication.  May be prescribed by your caregiver to help lowering cholesterol and the risk for heart disease.  You may need medicine even if your levels are normal if you have several risk factors. HOME CARE INSTRUCTIONS   Follow your diet and exercise programs as suggested by your caregiver.  Take medications as directed.  Have blood work done when your caregiver feels it is necessary. MAKE SURE YOU:   Understand these instructions.  Will watch your condition.  Will get help right away if you are not doing well or get worse. Document Released: 11/12/2000 Document Revised: 05/12/2011 Document Reviewed: 05/05/2007 [facility] Patient Information 2014 [place], [place].

## 2013-01-09 MED ORDER — POTASSIUM CHLORIDE CRYS ER 20 MEQ PO TBCR: 20.0000 meq | EXTENDED_RELEASE_TABLET | Freq: Two times a day (BID) | ORAL | Status: DC

## 2013-01-09 NOTE — Addendum Note (Signed)
Addended by: Lucky Cowboy on: 01/09/2013 06:05 PM   Modules accepted: Orders

## 2013-01-10 ENCOUNTER — Telehealth: Payer: Self-pay | Admitting: *Deleted

## 2013-01-10 NOTE — Telephone Encounter (Signed)
Message copied by Reggy Eye on Mon Jan 10, 2013 12:35 PM ------      Message from: Lucky Cowboy      Created: Sun Jan 09, 2013  6:13 PM       Call Pt Chol 166 - LDL 94 OK - lower LDL would be better to reduce chance of Ht Attack - avoid meat esp red meat and dairy - esp cheese      A.c 8.9 % very high - need stricter diet and wt loss - avoid candy/sweets - white stuff and lose weight      Magnesium is low - recc OTC mag 500 mg 1-2 x/da      K+/ potassium also very low Rx sent to [facility] - Rx says bid - take bid x2 wks -  then cut back to qd      All else nl & OK ------

## 2013-01-14 ENCOUNTER — Encounter (INDEPENDENT_AMBULATORY_CARE_PROVIDER_SITE_OTHER): Payer: Self-pay | Admitting: General Surgery

## 2013-04-06 ENCOUNTER — Ambulatory Visit (HOSPITAL_COMMUNITY)
Admission: RE | Admit: 2013-04-06 | Discharge: 2013-04-06 | Disposition: A | Discharge status: Home / Self Care | Payer: 59 | Source: Ambulatory Visit | Attending: Physician Assistant | Admitting: Physician Assistant

## 2013-04-06 ENCOUNTER — Encounter: Payer: Self-pay | Admitting: Physician Assistant

## 2013-04-06 ENCOUNTER — Ambulatory Visit (INDEPENDENT_AMBULATORY_CARE_PROVIDER_SITE_OTHER): Payer: 59 | Admitting: Physician Assistant

## 2013-04-06 VITALS — BP 138/78 | HR 76 | Temp 98.1°F | Resp 16 | Ht 74.0 in | Wt 255.0 lb

## 2013-04-06 DIAGNOSIS — K219 Gastro-esophageal reflux disease without esophagitis: Secondary | ICD-10-CM | POA: Insufficient documentation

## 2013-04-06 DIAGNOSIS — Z79899 Other long term (current) drug therapy: Secondary | ICD-10-CM

## 2013-04-06 DIAGNOSIS — E559 Vitamin D deficiency, unspecified: Secondary | ICD-10-CM | POA: Insufficient documentation

## 2013-04-06 DIAGNOSIS — E1169 Type 2 diabetes mellitus with other specified complication: Secondary | ICD-10-CM | POA: Insufficient documentation

## 2013-04-06 DIAGNOSIS — E785 Hyperlipidemia, unspecified: Secondary | ICD-10-CM | POA: Insufficient documentation

## 2013-04-06 DIAGNOSIS — E119 Type 2 diabetes mellitus without complications: Secondary | ICD-10-CM | POA: Insufficient documentation

## 2013-04-06 DIAGNOSIS — I10 Essential (primary) hypertension: Secondary | ICD-10-CM

## 2013-04-06 DIAGNOSIS — E782 Mixed hyperlipidemia: Secondary | ICD-10-CM

## 2013-04-06 DIAGNOSIS — Z1159 Encounter for screening for other viral diseases: Secondary | ICD-10-CM

## 2013-04-06 DIAGNOSIS — Z0000 Encounter for general adult medical examination without abnormal findings: Secondary | ICD-10-CM

## 2013-04-06 DIAGNOSIS — IMO0002 Reserved for concepts with insufficient information to code with codable children: Secondary | ICD-10-CM | POA: Insufficient documentation

## 2013-04-06 DIAGNOSIS — Z125 Encounter for screening for malignant neoplasm of prostate: Secondary | ICD-10-CM

## 2013-04-06 LAB — LIPID PANEL
Cholesterol: 214 mg/dL — ABNORMAL HIGH (ref 0–200)
HDL: 46 mg/dL (ref >39)
LDL CALC: 135 mg/dL — AB (ref 0–99)
Total CHOL/HDL Ratio: 4.7 Ratio
Triglycerides: 166 mg/dL — ABNORMAL HIGH (ref <150)
VLDL: 33 mg/dL (ref 0–40)

## 2013-04-06 LAB — CBC WITH DIFFERENTIAL/PLATELET
Basophils Absolute: 0.1 10*3/uL (ref 0.0–0.1)
Basophils Relative: 2 % — ABNORMAL HIGH (ref 0–1)
Eosinophils Absolute: 0.1 10*3/uL (ref 0.0–0.7)
Eosinophils Relative: 2 % (ref 0–5)
HEMATOCRIT: 46.7 % (ref 39.0–52.0)
Hemoglobin: 16.3 g/dL (ref 13.0–17.0)
LYMPHS PCT: 23 % (ref 12–46)
Lymphs Abs: 1.1 10*3/uL (ref 0.7–4.0)
MCH: 28.2 pg (ref 26.0–34.0)
MCHC: 34.9 g/dL (ref 30.0–36.0)
MCV: 80.7 fL (ref 78.0–100.0)
MONOS PCT: 8 % (ref 3–12)
Monocytes Absolute: 0.4 10*3/uL (ref 0.1–1.0)
NEUTROS ABS: 3.2 10*3/uL (ref 1.7–7.7)
Neutrophils Relative %: 65 % (ref 43–77)
Platelets: 197 10*3/uL (ref 150–400)
RBC: 5.79 MIL/uL (ref 4.22–5.81)
RDW: 13.7 % (ref 11.5–15.5)
WBC: 4.9 10*3/uL (ref 4.0–10.5)

## 2013-04-06 LAB — VITAMIN B12: Vitamin B-12: 454 pg/mL (ref 211–911)

## 2013-04-06 LAB — IRON AND TIBC
%SAT: 28 % (ref 20–55)
Iron: 84 ug/dL (ref 42–165)
TIBC: 305 ug/dL (ref 215–435)
UIBC: 221 ug/dL (ref 125–400)

## 2013-04-06 LAB — BASIC METABOLIC PANEL WITH GFR
BUN: 17 mg/dL (ref 6–23)
CHLORIDE: 98 meq/L (ref 96–112)
CO2: 30 meq/L (ref 19–32)
CREATININE: 1.05 mg/dL (ref 0.50–1.35)
Calcium: 9.6 mg/dL (ref 8.4–10.5)
GFR, Est African American: 89 mL/min
GFR, Est Non African American: 77 mL/min
GLUCOSE: 146 mg/dL — AB (ref 70–99)
POTASSIUM: 3.4 meq/L — AB (ref 3.5–5.3)
Sodium: 139 mEq/L (ref 135–145)

## 2013-04-06 LAB — TSH: TSH: 1.94 u[IU]/mL (ref 0.350–4.500)

## 2013-04-06 LAB — HEPATIC FUNCTION PANEL
ALBUMIN: 4 g/dL (ref 3.5–5.2)
ALK PHOS: 73 U/L (ref 39–117)
ALT: 19 U/L (ref 0–53)
AST: 16 U/L (ref 0–37)
BILIRUBIN INDIRECT: 0.7 mg/dL (ref 0.2–1.2)
Bilirubin, Direct: 0.1 mg/dL (ref 0.0–0.3)
TOTAL PROTEIN: 6.6 g/dL (ref 6.0–8.3)
Total Bilirubin: 0.8 mg/dL (ref 0.2–1.2)

## 2013-04-06 LAB — URIC ACID: Uric Acid, Serum: 3 mg/dL — ABNORMAL LOW (ref 4.0–7.8)

## 2013-04-06 LAB — HEMOGLOBIN A1C
Hgb A1c MFr Bld: 8.3 % — ABNORMAL HIGH (ref <5.7)
MEAN PLASMA GLUCOSE: 192 mg/dL — AB (ref <117)

## 2013-04-06 LAB — MAGNESIUM: Magnesium: 1.8 mg/dL (ref 1.5–2.5)

## 2013-04-06 NOTE — Progress Notes (Signed)
Complete Physical HPI Patient presents for complete physical.   Patient's blood pressure has been controlled at home. Patient denies chest pain, shortness of breath, dizziness. BP: 138/78 mmHg  Patient's cholesterol is diet controlled. They are on Lipitor but has not taken it in 3 weeks and denies myalgias. His cholesterol is not at goal. The patient's cholesterol last visit was:  Lab Results  Component Value Date   CHOL 166 01/07/2013   HDL 45 01/07/2013   LDLCALC 94 01/07/2013   TRIG 137 01/07/2013   CHOLHDL 3.7 01/07/2013   The patient is insulin dependent, he has been working on diet and exercise for diabetes, denies changes in vision, polys, and paresthesias. His A1C is not at goal.  Last A1C in office was:  Hemoglobin A1C  Date Value Range Status  01/07/2013 8.9* <5.7 % Final   Last visit he had hypokalemia and was put on KCL but never picked up the prescription. Denies muscle cramping.  Lab Results  Component Value Date   CREATININE 1.01 01/07/2013   BUN 16 01/07/2013   NA 140 01/07/2013   K 3.2* 01/07/2013   CL 99 01/07/2013   CO2 36* 01/07/2013   No on flomax and states cialis is not helping much.   Current Medications:  Current Outpatient Prescriptions on File Prior to Visit  Medication Sig Dispense Refill  . aspirin 81 MG tablet Take 81 mg by mouth daily.      Marland Kitchen atorvastatin (LIPITOR) 40 MG tablet Take 40 mg by mouth daily.      . B-D INS SYRINGE 0.5CC/31GX5/16 31G X 5/16" 0.5 ML MISC       . bisoprolol-hydrochlorothiazide (ZIAC) 5-6.25 MG per tablet Take 1 tablet by mouth daily.      . insulin glargine (LANTUS) 100 UNIT/ML injection Inject 60 Units into the skin at bedtime.       . Insulin Lispro, Human, (HUMALOG PEN [place]) Inject 50 Units into the skin daily.       Marland Kitchen losartan-hydrochlorothiazide (HYZAAR) 100-25 MG per tablet Take 1 tablet by mouth daily.      . metFORMIN (GLUCOPHAGE) 500 MG tablet Take 500 mg by mouth 3 (three) times daily.      Marland Kitchen omeprazole (PRILOSEC) 20 MG  capsule Take 20 mg by mouth daily.      . ONE TOUCH ULTRA TEST test strip       . potassium chloride SA (K-DUR,KLOR-CON) 20 MEQ tablet Take 1 tablet (20 mEq total) by mouth 2 (two) times daily.  60 tablet  99  . tadalafil (CIALIS) 5 MG tablet Take 5 mg by mouth daily as needed.      . tamsulosin (FLOMAX) 0.4 MG CAPS capsule Take 1 capsule (0.4 mg total) by mouth daily. For prostate  30 capsule  99   No current facility-administered medications on file prior to visit.   Health Maintenance:  Tetanus: 2010 Pneumovax: 2008 Flu vaccine: 12/2012 Zostavax: 2012 DEXA: N/A Colonoscopy: 2010 due 2015 (Dr. Cristina Gong) Will get done in June EGD: N/A  Allergies:  Allergies  Allergen Reactions  . Crestor [Rosuvastatin]    Medical History:  Past Medical History  Diagnosis Date  . Diabetes mellitus without complication 9381    insulin requiring  . Hyperlipidemia   . Hypertension   . OSA on CPAP     10-12 years ago  . Vitamin D deficiency   . GERD (gastroesophageal reflux disease)    Surgical History:  Past Surgical History  Procedure Laterality Date  .  Anal fissure repair  2007  . Ventral hernia repair  04/13/12    Dr. Johney [place], ventral hernia repair w/ mesh   Family History:  Family History  Problem Relation Age of Onset  . Cancer Father     kidney   Social History:  History   Social History  . Marital Status: Married    Spouse Name: N/A    Number of Children: N/A  . Years of Education: N/A   Occupational History  . Not on file.   Social History Main Topics  . Smoking status: Never Smoker   . Smokeless tobacco: Not on file  . Alcohol Use: 0.0 oz/week    2-5 Cans of beer per week     Comment: weekly  . Drug Use: No  . Sexual Activity: Not on file   Other Topics Concern  . Not on file   Social History Narrative  . No narrative on file   ROS Constitutional: Denies weight loss/gain, headaches, insomnia, fatigue, night sweats, and change in appetite. Eyes: DEE 10+  years ago Denies redness, blurred vision, diplopia, discharge, itchy, watery eyes.  ENT: Denies discharge, congestion, post nasal drip, sore throat, earache, hearing loss, dental pain, Tinnitus, Vertigo, Sinus pain, snoring.  Cardio: Denies chest pain, palpitations, irregular heartbeat, dyspnea, diaphoresis, orthopnea, PND, claudication, edema Respiratory: denies cough, dyspnea, pleurisy, hoarseness, wheezing.  Gastrointestinal: (Buccini) Denies dysphagia, heartburn, pain, cramps, nausea, vomiting, bloating, diarrhea, constipation, hematemesis, melena, hematochezia, hemorrhoids Genitourinary: + ED Denies dysuria, frequency, urgency, nocturia, hesitancy, discharge, hematuria, flank pain Musculoskeletal: Denies arthralgia, myalgia, stiffness, Jt. Swelling, pain, Skin: Denies pruritis, rash, hives,  acne, eczema, changing in skin lesion Neuro: Denies Weakness, tremor, incoordination, spasms, paresthesia, pain Psychiatric: Denies confusion, memory loss, sensory loss Endocrine: Denies change in weight, skin, hair change, nocturia, and paresthesia, Diabetic Denies Polys, visual blurring, hyper /hypo glycemic episodes.  Heme/Lymph: Denies Excessive bleeding, bruising, enlarged lymph nodes  Physical Exam: Estimated body mass index is 32.73 kg/(m^2) as calculated from the following:   Height as of this encounter: 6\' 2"  (1.88 m).   Weight as of this encounter: 255 lb (115.667 kg). Filed Vitals:   04/06/13 0906  BP: 138/78  Pulse: 76  Temp: 98.1 F (36.7 C)  Resp: 16   General Appearance: Well nourished, in no apparent distress. Eyes: PERRLA, EOMs, conjunctiva no swelling or erythema, normal fundi and vessels. Sinuses: No Frontal/maxillary tenderness ENT/Mouth: Ext aud canals clear, normal light reflex with TMs without erythema, bulging. Good dentition. No erythema, swelling, or exudate on post pharynx. Tonsils not swollen or erythematous. Hearing normal.  Neck: Supple, thyroid normal. No  bruits Respiratory: Respiratory effort normal, BS equal bilaterally without rales, rhonchi, wheezing or stridor. Cardio: RRR without murmurs, rubs or gallops. Brisk peripheral pulses without edema.  Chest: symmetric, with normal excursions and percussion. Abdomen: Soft, obese +BS. Non tender, mild LLQ guarding from hernia repair but no pain, no rebound, hernias, masses, or organomegaly. .  Lymphatics: Non tender without lymphadenopathy.  Genitourinary: defer Musculoskeletal: Full ROM all peripheral extremities,5/5 strength, and normal gait. Skin: Warm, dry without rashes, lesions, ecchymosis.  Neuro: Cranial nerves intact, reflexes equal bilaterally. Normal muscle tone, no cerebellar symptoms. Sensation intact.  Psych: Awake and oriented X 3, normal affect, Insight and Judgment appropriate.   EKG: WNL no changes.  Assessment and Plan: Diabetes mellitus insulin dependent- continue meds, check A1C  Hyperlipidemia- get back on lipitor, and check lipids  Hypertension- continue meds, DASH diet and monitor at home  OSA on CPAP-  cont CPAP  Vitamin D deficiency- check level  GERD (gastroesophageal reflux disease)- controlled on med  CKD secondary DM- check GFR, patient counseled ED secondary DM- will try samples of Viagra given Eye appointment recommended Colonoscopy this year Health Maintenance  Discussed med's effects and SE's. Screening labs and tests as requested with regular follow-up as recommended.  Vicie Mutters 9:21 AM

## 2013-04-06 NOTE — Patient Instructions (Addendum)
Bad carbs also include fruit juice, alcohol, and sweet tea. These are empty calories that do not signal to your brain that you are full.   Please remember the good carbs are still carbs which convert into sugar. So please measure them out no more than 1/2-1 cup of rice, oatmeal, pasta, and beans.  Veggies are however free foods! Pile them on.   I like lean protein at every meal such as chicken, [place], pork chops, cottage cheese, etc. Just do not fry these meats and please center your meal around vegetable, the meats should be a side dish.   No all fruit is created equal. Please see the list below, the fruit at the bottom is higher in sugars than the fruit at the top   Please get the white salt substitute, 1/4 teaspoon of this is equivalent to 42meq potassium.   Potassium Content of Foods Potassium is a mineral found in many foods and drinks. It helps keep fluids and minerals balanced in your body and also affects how steadily your heart beats. The body needs potassium to control blood pressure and to keep the muscles and nervous system healthy. However, certain health conditions and medicine may require you to eat more or less potassium-rich foods and drinks. Your caregiver or dietitian will tell you how much potassium you should have each day. COMMON SERVING SIZES The list below tells you how big or small common portion sizes are:  1 oz.........4 stacked dice.  3 oz........Marland KitchenDeck of cards.  1 tsp.......Marland KitchenTip of little finger.  1 tbsp....Marland KitchenMarland KitchenThumb.  2 tbsp....Marland KitchenMarland KitchenGolf ball.   c..........Marland KitchenHalf of a fist.  1 c...........Marland KitchenA fist. FOODS AND DRINKS HIGH IN POTASSIUM More than 200 mg of potassium per serving. A serving size is  c (120 mL or noted gram weight) unless otherwise stated. While all the items on this list are high in potassium, some items are higher in potassium than others. Fruits  Avocado (cubed),  c / 50 g.  Banana (sliced), 75 g.  Cantaloupe (cubed), 80 g.  Guava, c /  55 g.  Honeydew, 1 wedge / 85 g.  Kiwi (sliced), 90 g.  Nectarine, 1 small / 129 g.  Orange, 1 medium / 131 g.  Pomegranate seeds, 87 g.  Pomegranate juice.  Prunes (pitted), 3 whole / 30 g.  Prune juice, 3 oz / 90 mL.  Seedless raisins, 3 tbsp / 27 g. Vegetables  Artichoke,  of a medium / 64 g.  Asparagus (boiled), 90 g.  Baked beans,  c / 63 g.  Bamboo shoots,  c / 38 g.  Beets (cooked slices), 85 g.  Broccoli (boiled), 78 g.  Brussels sprout (boiled), 78 g.  Butternut squash (baked), 103 g.  Chickpea (cooked), 82 g.  Green peas (cooked), 80 g.  Hubbard squash (baked cubes),  c / 68 g.  Kidney beans (cooked), 5 tbsp / 55 g.  Lima beans (cooked),  c / 43 g.  Navy beans (cooked),  c / 61 g.  Potato (baked), 61 g.  Potato (boiled), 78 g.  Pumpkin (boiled), 123 g.  Refried beans,  c / 79 g.  Spinach (cooked),  c / 45 g.  Split peas (cooked),  c / 65 g.  Sun-dried tomatoes, 2 tbsp / 7 g.  Sweet potato (baked),  c / 50 g.  Tomato (chopped or sliced), 90 g.  Tomato juice.  Tomato paste, 4 tsp / 21 g.  Tomato sauce,  c / 61 g.  Vegetable  juice.  White mushrooms (cooked), 78 g.  Yam (cooked or baked),  c / 34 g.  Zucchini squash (boiled), 90 g. Other Foods and Drinks  Almonds (whole),  c / 36 g.  Cashews (oil roasted),  c / 32 g.  Clams (steamed), 1.5 oz / 43 g.  Dark chocolate, 1.5 oz / 42 g.  Fish, 3 oz / 85 g.  King crab (steamed), 3 oz / 85 g.  Lobster (steamed), 4 oz / 113 g.  Milk (skim, 1%, 2%, whole), 1 c / 240 mL.  Nonfat fruit variety yogurt, 123 g.  Peanuts (oil roasted), 1 oz / 28 g.  Peanut butter, 2 tbsp / 32 g.  Pistachio nuts, 1 oz / 28 g.  Pumpkin seeds, 1 oz / 28 g.  Red meat (broiled, cooked, grilled), 3 oz / 85 g.  Scallops (steamed), 3 oz / 85 g.  Shredded wheat cereal (dry), 3 oblong biscuits / 75 g.  Spaghetti sauce,  c / 66 g.  Sunflower seeds (dry roasted), 1 oz / 28  g.  Veggie burger, 1 patty / 70 g. FOODS MODERATE IN POTASSIUM Between 150 mg and 200 mg per serving. A serving is  c (120 mL or noted gram weight) unless otherwise stated. Fruits  Pineapple juice.  Plums (sliced), 83 g.  Tangerine, 1 large / 120 g. Vegetables  Carrots (boiled), 78 g.  Carrots (sliced), 61 g.  Rhubarb (cooked with sugar), 120 g.  R[place]abaga (cooked), 120 g.  Yellow snap beans (cooked), 63 g. Other Foods and Drinks   Chicken breast (roasted and chopped),  c / 70 g.  Shrimp (steamed), 4 oz / 113 g.  Swiss cheese (diced), 70 g.   High potassium foods from natural food sources like beans, dark leafy greens, potatoes, squash, yogurt, fish, avocados, mushrooms, and bananas, are considered safe and healthy.  GET EYE APPOINTMENT!!!  Cholesterol Cholesterol is a white, waxy, fat-like protein needed by your body in small amounts. The liver makes all the cholesterol you need. It is carried from the liver by the blood through the blood vessels. Deposits (plaque) may build up on blood vessel walls. This makes the arteries narrower and stiffer. Plaque increases the risk for heart attack and stroke. You cannot feel your cholesterol level even if it is very high. The only way to know is by a blood test to check your lipid (fats) levels. Once you know your cholesterol levels, you should keep a record of the test results. Work with your caregiver to to keep your levels in the desired range. WHAT THE RESULTS MEAN:  Total cholesterol is a rough measure of all the cholesterol in your blood.  LDL is the so-called bad cholesterol. This is the type that deposits cholesterol in the walls of the arteries. You want this level to be low.  HDL is the good cholesterol because it cleans the arteries and carries the LDL away. You want this level to be high.  Triglycerides are fat that the body can either burn for energy or store. High levels are closely linked to heart  disease. DESIRED LEVELS:  Total cholesterol below 200.  LDL below 100 for people at risk, below 70 for very high risk.  HDL above 50 is good, above 60 is best.  Triglycerides below 150. HOW TO LOWER YOUR CHOLESTEROL:  Diet.  Choose fish or white meat chicken and [place], roasted or baked. Limit fatty cuts of red meat, fried foods, and processed meats, such  as sausage and lunch meat.  Eat lots of fresh fruits and vegetables. Choose whole grains, beans, pasta, potatoes and cereals.  Use only small amounts of olive, corn or canola oils. Avoid butter, mayonnaise, shortening or palm kernel oils. Avoid foods with trans-fats.  Use skim/nonfat milk and low-fat/nonfat yogurt and cheeses. Avoid whole milk, cream, ice cream, egg yolks and cheeses. Healthy desserts include angel food cake, ginger snaps, animal crackers, hard candy, popsicles, and low-fat/nonfat frozen yogurt. Avoid pastries, cakes, pies and cookies.  Exercise.  A regular program helps decrease LDL and raises HDL.  Helps with weight control.  Do things that increase your activity level like gardening, walking, or taking the stairs.  Medication.  May be prescribed by your caregiver to help lowering cholesterol and the risk for heart disease.  You may need medicine even if your levels are normal if you have several risk factors. HOME CARE INSTRUCTIONS   Follow your diet and exercise programs as suggested by your caregiver.  Take medications as directed.  Have blood work done when your caregiver feels it is necessary. MAKE SURE YOU:   Understand these instructions.  Will watch your condition.  Will get help right away if you are not doing well or get worse. Document Released: 11/12/2000 Document Revised: 05/12/2011 Document Reviewed: 12/01/2012 [facility] Patient Information 2014 [place], [place].

## 2013-04-07 LAB — HEPATITIS C ANTIBODY: HCV Ab: NEGATIVE

## 2013-04-07 LAB — URINALYSIS, ROUTINE W REFLEX MICROSCOPIC
Bilirubin Urine: NEGATIVE
Glucose, UA: 100 mg/dL — AB
Hgb urine dipstick: NEGATIVE
KETONES UR: NEGATIVE mg/dL
Leukocytes, UA: NEGATIVE
NITRITE: NEGATIVE
PH: 7 (ref 5.0–8.0)
PROTEIN: NEGATIVE mg/dL
Specific Gravity, Urine: 1.023 (ref 1.005–1.030)
Urobilinogen, UA: 0.2 mg/dL (ref 0.0–1.0)

## 2013-04-07 LAB — TESTOSTERONE: Testosterone: 435 ng/dL (ref 300–890)

## 2013-04-07 LAB — MICROALBUMIN / CREATININE URINE RATIO
Creatinine, Urine: 151.4 mg/dL
MICROALB/CREAT RATIO: 3.3 mg/g (ref 0.0–30.0)
Microalb, Ur: 0.5 mg/dL (ref 0.00–1.89)

## 2013-04-07 LAB — HEPATITIS B SURFACE ANTIBODY,QUALITATIVE: Hep B S Ab: NEGATIVE

## 2013-04-07 LAB — HEPATITIS B CORE ANTIBODY, TOTAL: HEP B C TOTAL AB: NONREACTIVE

## 2013-04-07 LAB — VITAMIN D 25 HYDROXY (VIT D DEFICIENCY, FRACTURES): Vit D, 25-Hydroxy: 59 ng/mL (ref 30–89)

## 2013-04-07 LAB — PSA: PSA: 2.18 ng/mL (ref <=4.00)

## 2013-04-07 LAB — FOLATE RBC: RBC FOLATE: 517 ng/mL (ref >280)

## 2013-04-07 LAB — HEPATITIS A ANTIBODY, TOTAL: HEP A TOTAL AB: NONREACTIVE

## 2013-04-07 LAB — HEPATITIS B E ANTIBODY: Hepatitis Be Antibody: NEGATIVE

## 2013-09-17 ENCOUNTER — Other Ambulatory Visit: Payer: Self-pay | Admitting: Internal Medicine

## 2013-09-17 ENCOUNTER — Other Ambulatory Visit: Payer: Self-pay | Admitting: Physician Assistant

## 2013-09-18 ENCOUNTER — Other Ambulatory Visit: Payer: Self-pay | Admitting: Internal Medicine

## 2013-10-11 ENCOUNTER — Other Ambulatory Visit: Payer: Self-pay | Admitting: Physician Assistant

## 2013-10-11 ENCOUNTER — Other Ambulatory Visit: Payer: Self-pay | Admitting: Internal Medicine

## 2013-11-12 ENCOUNTER — Other Ambulatory Visit: Payer: Self-pay | Admitting: Internal Medicine

## 2013-11-28 ENCOUNTER — Encounter: Payer: Self-pay | Admitting: Physician Assistant

## 2013-11-28 ENCOUNTER — Ambulatory Visit (INDEPENDENT_AMBULATORY_CARE_PROVIDER_SITE_OTHER): Payer: 59 | Admitting: Physician Assistant

## 2013-11-28 VITALS — BP 110/70 | HR 76 | Temp 97.7°F | Resp 16 | Ht 74.0 in | Wt 262.0 lb

## 2013-11-28 DIAGNOSIS — E876 Hypokalemia: Secondary | ICD-10-CM

## 2013-11-28 DIAGNOSIS — E559 Vitamin D deficiency, unspecified: Secondary | ICD-10-CM

## 2013-11-28 DIAGNOSIS — Z23 Encounter for immunization: Secondary | ICD-10-CM

## 2013-11-28 DIAGNOSIS — K2100 Gastro-esophageal reflux disease with esophagitis, without bleeding: Secondary | ICD-10-CM

## 2013-11-28 DIAGNOSIS — E785 Hyperlipidemia, unspecified: Secondary | ICD-10-CM

## 2013-11-28 DIAGNOSIS — I10 Essential (primary) hypertension: Secondary | ICD-10-CM

## 2013-11-28 DIAGNOSIS — N182 Chronic kidney disease, stage 2 (mild): Secondary | ICD-10-CM

## 2013-11-28 DIAGNOSIS — E1129 Type 2 diabetes mellitus with other diabetic kidney complication: Secondary | ICD-10-CM

## 2013-11-28 DIAGNOSIS — E669 Obesity, unspecified: Secondary | ICD-10-CM

## 2013-11-28 MED ORDER — INSULIN LISPRO 100 UNIT/ML ~~LOC~~ SOLN: SUBCUTANEOUS | Status: DC

## 2013-11-28 MED ORDER — LOSARTAN POTASSIUM-HCTZ 100-12.5 MG PO TABS: 1.0000 | ORAL_TABLET | Freq: Every day | ORAL | Status: DC

## 2013-11-28 NOTE — Patient Instructions (Addendum)
   Bad carbs also include fruit juice, alcohol, and sweet tea. These are empty calories that do not signal to your brain that you are full.   Please remember the good carbs are still carbs which convert into sugar. So please measure them out no more than 1/2-1 cup of rice, oatmeal, pasta, and beans.  Veggies are however free foods! Pile them on.   I like lean protein at every meal such as chicken, [place], pork chops, cottage cheese, etc. Just do not fry these meats and please center your meal around vegetable, the meats should be a side dish.   No all fruit is created equal. Please see the list below, the fruit at the bottom is higher in sugars than the fruit at the top   Your LDL is not in range. Your LDL is the bad cholesterol that can lead to heart attack and stroke. To lower your number you can decrease your fatty foods, red meat, cheese, milk and increase fiber like whole grains and veggies. You can also add a fiber supplement like Metamucil or Benefiber.  Benefiber is good for constipation/diarrhea/irritable bowel syndrome, it helps with weight loss and can help lower your bad cholesterol. Please do 1-2 TBSP in the morning in water, coffee, or tea. It can take up to a month before you can see a difference with your bowel movements. It is cheapest from [employer], [employer], [employer].

## 2013-11-28 NOTE — Progress Notes (Signed)
Assessment and Plan:  Essential hypertension - Plan: CBC with Differential, BASIC METABOLIC PANEL WITH GFR, Hepatic function panel, TSH - continue medications, DASH diet, exercise and monitor at home. Call if greater than 130/80.   CKD (chronic kidney disease) stage 2, GFR 60-89 ml/min - Plan: BASIC METABOLIC PANEL WITH GFR Control HTN, glucose, increase water, we will also decrease HCTZ to 12.5mg  due to orthostatic hypotension  Hyperlipidemia - Plan: Lipid panel -continue medications, check lipids, decrease fatty foods, increase activity.   Hypomagnesemia - Plan: Magnesium  Hypokalemia - Plan: BASIC METABOLIC PANEL WITH GFR  Obesity (BMI 30-39.9) Obesity with co morbidities- long discussion about weight loss, diet, and exercise  Vitamin D deficiency - Plan: Vit D  25 hydroxy (rtn osteoporosis monitoring)  Type II or unspecified type diabetes mellitus with renal manifestations, not stated as uncontrolled - Plan: Hemoglobin A1c Discussed general issues about diabetes pathophysiology and management., Educational material distributed., Suggested low cholesterol diet., Encouraged aerobic exercise., Discussed foot care., Reminded to get yearly retinal exam.  Gastroesophageal reflux disease with esophagitis- controlled with meds  Need for prophylactic vaccination and inoculation against influenza - Plan: Flu vaccine greater than or equal to 3yo with preservative IM, CANCELED: Flu Vaccine QUAD with presevative (Fluzone Quad)   Continue diet and meds as discussed. Further disposition pending results of labs. Discussed med's effects and SE's.    HPI 58 y.o. male  presents for 3 month follow up with hypertension, hyperlipidemia, diabetes and vitamin D. His blood pressure has been controlled at home, today their BP is BP: 110/70 mmHg He does not workout but [place] and is very [place]. He denies chest pain, shortness of breath. He does have dizziness with standing, no edema.  He is  on cholesterol medication and denies myalgias. His cholesterol is not at goal. The cholesterol last visit was:   Lab Results  Component Value Date   CHOL 214* 04/06/2013   HDL 46 04/06/2013   LDLCALC 135* 04/06/2013   TRIG 166* 04/06/2013   CHOLHDL 4.7 04/06/2013   He has been working on diet and exercise for Diabetes, he has CKD II, he is on lantus 60 units and humolog 60-80 units TID sliding scale, he is on ARB, and denies hypoglycemia , paresthesia of the feet, polydipsia, polyuria and visual disturbances. Up to 2-3 weeks ago his sugars were running 60-200's but it has been better since he increased his insuling to 25 units, he is also on the metformin tablet 4 a day which is helping. Last A1C in the office was:  Lab Results  Component Value Date   HGBA1C 8.3* 04/06/2013   Patient is on Vitamin D supplement. Lab Results  Component Value Date   VD25OH 59 04/06/2013     BMI is Body mass index is 33.62 kg/(m^2). Wt Readings from Last 3 Encounters:  11/28/13 262 lb (118.842 kg)  04/06/13 255 lb (115.667 kg)  01/07/13 255 lb 3.2 oz (115.758 kg)   Current Medications:    Medication List       This list is accurate as of: 11/28/13  9:32 AM.  Always use your most recent med list.               aspirin 81 MG tablet  Take 81 mg by mouth daily.     atorvastatin 40 MG tablet  Commonly known as:  LIPITOR  TAKE 1 TABLET DAILY FOR CHOLESTEROL     BD INSULIN SYRINGE ULTRAFINE 31G X 5/16" 0.5  ML Misc  Generic drug:  Insulin Syringe-Needle U-100  USE TWICE A DAY AS DIRECTED     bisoprolol-hydrochlorothiazide 5-6.25 MG per tablet  Commonly known as:  ZIAC  Take 1 tablet by mouth daily.     CIALIS 20 MG tablet  Generic drug:  tadalafil  TAKE ONE-HALF (1/2) TABLET TO 1 TABLET EVERY 2 TO 3 DAYS AS NEEDED     HUMALOG 100 UNIT/ML injection  Generic drug:  insulin lispro  INJECT 60 -80  UNITS THREE TIMES A DAY BEFORE MEALS OR AS DIRECTED     insulin glargine 100 UNIT/ML injection  Commonly  known as:  LANTUS  Inject 60 Units into the skin at bedtime.     losartan-hydrochlorothiazide 100-25 MG per tablet  Commonly known as:  HYZAAR  TAKE 1 TABLET EVERY MORNING FOR BLOOD PRESSURE     metFORMIN 500 MG tablet  Commonly known as:  GLUCOPHAGE  Take 500 mg by mouth 3 (three) times daily.     omeprazole 20 MG capsule  Commonly known as:  PRILOSEC  TAKE 1 CAPSULE DAILY FOR ACID REFLUX     ONE TOUCH ULTRA TEST test strip  Generic drug:  glucose blood  USE TO TAKE GLUCOSE THREE TO FOUR TIMES A DAY FOR FLUCTUATING BLOOD SUGARS     potassium chloride SA 20 MEQ tablet  Commonly known as:  K-DUR,KLOR-CON  Take 1 tablet (20 mEq total) by mouth 2 (two) times daily.        Medical History:  Past Medical History  Diagnosis Date  . Diabetes mellitus without complication 7169    insulin requiring  . Hyperlipidemia   . Hypertension   . OSA on CPAP     10-12 years ago  . Vitamin D deficiency   . GERD (gastroesophageal reflux disease)   . Hypokalemia   . 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 Hypomagnesemia    Allergies:  Allergies  Allergen Reactions  . Crestor [Rosuvastatin]      Review of Systems: [X]  = complains of  [ ]  = denies  General: Fatigue [ ]  Fever [ ]  Chills [ ]  Weakness [ ]   Insomnia [ ]  Eyes: Redness [ ]  Blurred vision [ ]  Diplopia [ ]   ENT: Congestion [ ]  Sinus Pain [ ]  Post Nasal Drip [ ]  Sore Throat [ ]  Earache [ ]   Cardiac: Chest pain/pressure [ ]  SOB [ ]  Orthopnea [ ]   Palpitations [ ]   Paroxysmal nocturnal dyspnea[ ]  Claudication [ ]  Edema [ ]   Pulmonary: Cough [ ]  Wheezing[ ]   SOB [ ]   Snoring [ ]   GI: Nausea [ ]  Vomiting[ ]  Dysphagia[ ]  Heartburn[ ]  Abdominal pain [ ]  Constipation [ ] ; Diarrhea [ ] ; BRBPR [ ]  Melena[ ]  GU: Hematuria[ ]  Dysuria [ ]  Nocturia[ ]  Urgency [ ]   Hesitancy [ ]  Discharge [ ]  Neuro: Headaches[ ]  Vertigo[ ]  Paresthesias[ ]  Spasm [ ]  Speech changes [ ]  Incoordination [ ]   Ortho:  Arthritis [ ]  Joint pain [ ]  Muscle pain [ ]  Joint swelling [ ]  Back Pain [ ]  Skin:  Rash [ ]   Pruritis [ ]  Change in skin lesion [ ]   Psych: Depression[ ]  Anxiety[ ]  Confusion [ ]  Memory loss [ ]   Heme/Lypmh: Bleeding [ ]  Bruising [ ]  Enlarged lymph nodes [ ]   Endocrine: Visual blurring [ ]  Paresthesia [ ]  Polyuria [ ]  Polydypsea [ ]    Heat/cold  intolerance [ ]  Hypoglycemia [ ]   Family history- Review and unchanged Social history- Review and unchanged Physical Exam: BP 110/70  Pulse 76  Temp(Src) 97.7 F (36.5 C)  Resp 16  Ht 6\' 2"  (1.88 m)  Wt 262 lb (118.842 kg)  BMI 33.62 kg/m2 Wt Readings from Last 3 Encounters:  11/28/13 262 lb (118.842 kg)  04/06/13 255 lb (115.667 kg)  01/07/13 255 lb 3.2 oz (115.758 kg)   General Appearance: Well nourished, in no apparent distress. Eyes: PERRLA, EOMs, conjunctiva no swelling or erythema Sinuses: No Frontal/maxillary tenderness ENT/Mouth: Ext aud canals clear, TMs without erythema, bulging. No erythema, swelling, or exudate on post pharynx.  Tonsils not swollen or erythematous. Hearing normal.  Neck: Supple, thyroid normal.  Respiratory: Respiratory effort normal, BS equal bilaterally without rales, rhonchi, wheezing or stridor.  Cardio: RRR with no MRGs. Brisk peripheral pulses without edema.  Abdomen: Soft, + BS.  Non tender, no guarding, rebound, hernias, masses. Lymphatics: Non tender without lymphadenopathy.  Musculoskeletal: Full ROM, 5/5 strength, normal gait.  Skin: Warm, dry without rashes, lesions, ecchymosis.  Neuro: Cranial nerves intact. No cerebellar symptoms. Sensation intact.  Psych: Awake and oriented X 3, normal affect, Insight and Judgment appropriate.    Vicie Mutters 9:30 AM

## 2013-11-29 LAB — BASIC METABOLIC PANEL WITH GFR
BUN: 14 mg/dL (ref 6–23)
CO2: 31 meq/L (ref 19–32)
Calcium: 9.9 mg/dL (ref 8.4–10.5)
Chloride: 96 mEq/L (ref 96–112)
Creat: 1.06 mg/dL (ref 0.50–1.35)
GFR, Est African American: 88 mL/min
GFR, Est Non African American: 76 mL/min
GLUCOSE: 62 mg/dL — AB (ref 70–99)
Potassium: 3.1 mEq/L — ABNORMAL LOW (ref 3.5–5.3)
SODIUM: 139 meq/L (ref 135–145)

## 2013-11-29 LAB — CBC WITH DIFFERENTIAL/PLATELET
Basophils Absolute: 0.1 10*3/uL (ref 0.0–0.1)
Basophils Relative: 1 % (ref 0–1)
Eosinophils Absolute: 0.2 10*3/uL (ref 0.0–0.7)
Eosinophils Relative: 2 % (ref 0–5)
HCT: 43.8 % (ref 39.0–52.0)
HEMOGLOBIN: 15.3 g/dL (ref 13.0–17.0)
LYMPHS ABS: 1.3 10*3/uL (ref 0.7–4.0)
LYMPHS PCT: 16 % (ref 12–46)
MCH: 28.1 pg (ref 26.0–34.0)
MCHC: 34.9 g/dL (ref 30.0–36.0)
MCV: 80.4 fL (ref 78.0–100.0)
MONOS PCT: 8 % (ref 3–12)
Monocytes Absolute: 0.6 10*3/uL (ref 0.1–1.0)
NEUTROS PCT: 73 % (ref 43–77)
Neutro Abs: 5.8 10*3/uL (ref 1.7–7.7)
PLATELETS: 221 10*3/uL (ref 150–400)
RBC: 5.45 MIL/uL (ref 4.22–5.81)
RDW: 14.3 % (ref 11.5–15.5)
WBC: 7.9 10*3/uL (ref 4.0–10.5)

## 2013-11-29 LAB — MAGNESIUM: Magnesium: 1.8 mg/dL (ref 1.5–2.5)

## 2013-11-29 LAB — VITAMIN D 25 HYDROXY (VIT D DEFICIENCY, FRACTURES): Vit D, 25-Hydroxy: 61 ng/mL (ref 30–89)

## 2013-11-29 LAB — HEMOGLOBIN A1C
HEMOGLOBIN A1C: 7.9 % — AB (ref <5.7)
Mean Plasma Glucose: 180 mg/dL — ABNORMAL HIGH (ref <117)

## 2013-11-29 LAB — TSH: TSH: 3.21 u[IU]/mL (ref 0.350–4.500)

## 2013-11-29 LAB — HEPATIC FUNCTION PANEL
ALK PHOS: 73 U/L (ref 39–117)
ALT: 21 U/L (ref 0–53)
AST: 22 U/L (ref 0–37)
Albumin: 4 g/dL (ref 3.5–5.2)
BILIRUBIN DIRECT: 0.2 mg/dL (ref 0.0–0.3)
BILIRUBIN TOTAL: 0.9 mg/dL (ref 0.2–1.2)
Indirect Bilirubin: 0.7 mg/dL (ref 0.2–1.2)
Total Protein: 6.4 g/dL (ref 6.0–8.3)

## 2013-11-29 LAB — LIPID PANEL
Cholesterol: 153 mg/dL (ref 0–200)
HDL: 43 mg/dL (ref >39)
LDL Cholesterol: 54 mg/dL (ref 0–99)
Total CHOL/HDL Ratio: 3.6 Ratio
Triglycerides: 280 mg/dL — ABNORMAL HIGH (ref <150)
VLDL: 56 mg/dL — AB (ref 0–40)

## 2013-12-05 ENCOUNTER — Other Ambulatory Visit: Payer: Self-pay | Admitting: Internal Medicine

## 2013-12-05 MED ORDER — POTASSIUM CHLORIDE CRYS ER 20 MEQ PO TBCR: 20.0000 meq | EXTENDED_RELEASE_TABLET | Freq: Two times a day (BID) | ORAL | Status: DC

## 2013-12-21 ENCOUNTER — Other Ambulatory Visit: Payer: Self-pay | Admitting: Internal Medicine

## 2013-12-30 ENCOUNTER — Other Ambulatory Visit: Payer: Self-pay

## 2014-01-02 ENCOUNTER — Other Ambulatory Visit: Payer: Self-pay | Admitting: Internal Medicine

## 2014-01-02 ENCOUNTER — Other Ambulatory Visit (INDEPENDENT_AMBULATORY_CARE_PROVIDER_SITE_OTHER): Payer: 59

## 2014-01-02 DIAGNOSIS — E876 Hypokalemia: Secondary | ICD-10-CM

## 2014-01-02 LAB — BASIC METABOLIC PANEL WITH GFR
BUN: 16 mg/dL (ref 6–23)
CHLORIDE: 98 meq/L (ref 96–112)
CO2: 30 mEq/L (ref 19–32)
Calcium: 8.9 mg/dL (ref 8.4–10.5)
Creat: 1.09 mg/dL (ref 0.50–1.35)
GFR, EST NON AFRICAN AMERICAN: 74 mL/min
GFR, Est African American: 85 mL/min
Glucose, Bld: 102 mg/dL — ABNORMAL HIGH (ref 70–99)
POTASSIUM: 3.8 meq/L (ref 3.5–5.3)
Sodium: 138 mEq/L (ref 135–145)

## 2014-01-19 ENCOUNTER — Other Ambulatory Visit: Payer: Self-pay

## 2014-01-19 MED ORDER — INSULIN GLARGINE 100 UNIT/ML ~~LOC~~ SOLN: 60.0000 [IU] | Freq: Every day | SUBCUTANEOUS | Status: DC

## 2014-02-17 ENCOUNTER — Ambulatory Visit (INDEPENDENT_AMBULATORY_CARE_PROVIDER_SITE_OTHER): Payer: 59 | Admitting: Internal Medicine

## 2014-02-17 ENCOUNTER — Encounter: Payer: Self-pay | Admitting: Internal Medicine

## 2014-02-17 VITALS — BP 126/74 | HR 64 | Temp 98.2°F | Resp 16 | Ht 74.0 in | Wt 257.4 lb

## 2014-02-17 DIAGNOSIS — R079 Chest pain, unspecified: Secondary | ICD-10-CM

## 2014-02-17 DIAGNOSIS — E1029 Type 1 diabetes mellitus with other diabetic kidney complication: Secondary | ICD-10-CM

## 2014-02-17 DIAGNOSIS — R9431 Abnormal electrocardiogram [ECG] [EKG]: Secondary | ICD-10-CM

## 2014-02-17 DIAGNOSIS — I10 Essential (primary) hypertension: Secondary | ICD-10-CM

## 2014-02-17 DIAGNOSIS — Z9989 Dependence on other enabling machines and devices: Secondary | ICD-10-CM

## 2014-02-17 DIAGNOSIS — E1122 Type 2 diabetes mellitus with diabetic chronic kidney disease: Secondary | ICD-10-CM | POA: Insufficient documentation

## 2014-02-17 DIAGNOSIS — G4733 Obstructive sleep apnea (adult) (pediatric): Secondary | ICD-10-CM

## 2014-02-17 DIAGNOSIS — N182 Chronic kidney disease, stage 2 (mild): Secondary | ICD-10-CM | POA: Insufficient documentation

## 2014-02-17 DIAGNOSIS — E662 Morbid (severe) obesity with alveolar hypoventilation: Secondary | ICD-10-CM | POA: Insufficient documentation

## 2014-02-17 NOTE — Progress Notes (Signed)
Patient ID: Harry [name], male   DOB: 1955/05/29, 58 y.o.   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 Patient has a sedentary non physical  job & admits he has ben advised 2-3 x by Vicie Mutters, PA-C to have a screening stress test. He does admit increased fatigue or more specifically increased sleepiness over the last 6-8 weeks. Does not exercise & does little to no strenuous physical activity.    Today's BP is  126/74 mmHg. Patient has had no complaints of any cardiac type chest pain, palpitations, dyspnea/orthopnea/PND, dizziness, claudication, or dependent edema.   Hyperlipidemia is controlled with diet & meds. Patient denies myalgias or other med SE's. Last Lipids were at goal - Total Chol 153; HDL 43; LDL  54; with elevated Triglycerides 280 on 11/28/2013.   Patient has Morbid Obesity (BMI 33.03) and consequent T1DM. Also, regarding patient's T1_IDDM control, he alleges FBG's average <100 mg% and he denies symptoms of reactive hypoglycemia, diabetic polys, paresthesias or visual blurring.  Last A1c was  7.9% (Ave 180 mg%) on 11/28/2013.   Further, the patient also has history of Vitamin D Deficiency  (Vit D 32 in 2012) and he supplements vitamin D without any suspected side-effects. Last Vitamin D was  61 on  11/28/2013.   Medication List   LIPITOR  /  atorvastatin 40 MG tablet  TAKE 1 TABLET DAILY FOR CHOLESTEROL      ZIAC  /  bisoprolol-hctz 5-6.25 MG   TAKE 1 TABLET DAILY FOR BLOOD PRESSURE     CIALIS 20 MG tablet  TAKE  1/2 - 1 TAB EVERY 2 TO 3 DAYS AS NEEDED     LANTUS  /  insulin glargine 100 UNIT/ML injection  Inject 0.6 mLs (60 Units total) at bedtime.     HUMALOG  /  insulin lispro 100 UNIT/ML injection  INJECT 60 -80  UNITS THREE TIMES A DAY BEFORE MEALS OR AS DIRECTED     losartan-hctz  100-25 MG  daily     metFORMIN 500 MG tablet  Take 500 mg  3  times daily.     K-DUR / potassium chloride SA 20 MEQ tablet  Take 1 tab  2 times daily.     Allergies  Allergen Reactions  . Crestor [Rosuvastatin]    PMHx:   Past Medical History  Diagnosis Date  . Diabetes mellitus without complication 4010    insulin requiring  . Hyperlipidemia   . Hypertension   . OSA on CPAP     10-12 years ago  . Vitamin D deficiency   . GERD (gastroesophageal reflux disease)   . Hypokalemia   . 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 Hypomagnesemia    Immunization History  Administered Date(s) Administered  . Influenza Split 11/28/2013  . Influenza-Unspecified 12/19/2011, 01/04/2013  . Pneumococcal Polysaccharide-23 03/03/2006  . Td 03/03/2008  . Zoster 02/07/2011   Past Surgical History  Procedure Laterality Date  . Anal fissure repair  2007  . Ventral hernia repair  04/13/12    Dr. Johney Maine, ventral hernia repair w/ mesh   FHx:    Reviewed / unchanged  SHx:    Reviewed / unchanged  Systems Review:  Constitutional: Denies fever, chills, wt changes, headaches, insomnia, fatigue, night sweats, change in appetite. Eyes: Denies  redness, blurred vision, diplopia, discharge, itchy, watery eyes.  ENT: Denies discharge, congestion, post nasal drip, epistaxis, sore throat, earache, hearing loss, dental pain, tinnitus, vertigo, sinus pain, snoring.  CV: Denies chest pain, palpitations, irregular heartbeat, syncope, dyspnea, diaphoresis, orthopnea, PND, claudication or edema. Respiratory: denies cough, dyspnea, DOE, pleurisy, hoarseness, laryngitis, wheezing.  Gastrointestinal: Denies dysphagia, odynophagia, heartburn, reflux, water brash, abdominal pain or cramps, nausea, vomiting, bloating, diarrhea, constipation, hematemesis, melena, hematochezia  or hemorrhoids. Genitourinary: Denies dysuria, frequency, urgency, nocturia, hesitancy, discharge,  hematuria or flank pain. Musculoskeletal: Denies arthralgias, myalgias, stiffness, jt. swelling, pain, limping or strain/sprain.  Skin: Denies pruritus, rash, hives, warts, acne, eczema or change in skin lesion(s). Neuro: No weakness, tremor, incoordination, spasms, paresthesia or pain. Psychiatric: Denies confusion, memory loss or sensory loss. Endo: Denies change in weight, skin or hair change.  Heme/Lymph: No excessive bleeding, bruising or enlarged lymph nodes.  Physical Exam  BP 126/74   Pulse 64  Temp 98.2 F   Resp 16  Ht 6\' 2"   Wt 257 lb 6.4 oz         BMI 33.03   Appears well nourished and in no distress. Eyes: PERRLA, EOMs, conjunctiva no swelling or erythema. Sinuses: No frontal/maxillary tenderness ENT/Mouth: EAC's clear, TM's nl w/o erythema, bulging. Nares clear w/o erythema, swelling, exudates. Oropharynx clear without erythema or exudates. Oral hygiene is good. Tongue normal, non obstructing. Hearing intact.  Neck: Supple. Thyroid nl. Car 2+/2+ without bruits, nodes or JVD. Chest: Respirations nl with BS clear & equal w/o rales, rhonchi, wheezing or stridor.  Cor: Heart sounds normal w/ regular rate and rhythm without sig. murmurs, gallops, clicks, or rubs. Peripheral pulses normal and equal  without edema.  Abdomen: Soft & bowel sounds normal. Non-tender w/o guarding, rebound, hernias, masses, or organomegaly.  Lymphatics: Unremarkable.  Musculoskeletal: Full ROM all peripheral extremities, joint stability, 5/5 strength, and normal gait.  Skin: Warm, dry without exposed rashes, lesions or ecchymosis apparent.  Neuro: Cranial nerves intact, reflexes equal bilaterally. Sensory-motor testing grossly intact. Tendon reflexes grossly intact.  Pysch: Alert & oriented x 3.  Insight and judgement nl & appropriate. No ideations.  Assessment and Plan:  1. Hypertension - Continue monitor blood pressure at home. Continue diet/meds same.  2. Hyperlipidemia - Continue diet/meds,  exercise,& lifestyle modifications. Continue monitor periodic cholesterol/liver & renal functions   3. 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 Monitor appropriate labs.  4. Vitamin D Deficiency - Continue supplementation.   Recommended regular exercise, BP monitoring, weight control, and discussed med and SE's. Recommended labs to assess and monitor clinical status. Further disposition pending results of labs.   EKG from work reviewed & compared to last 2 EKG's done in this office and show no significant change with the difference(s)  felt related to chest lead placement. Patient is agreeable to Cardiology consult for nuclear stress testing & EKG's were returned to patient to take to his appointment. Next appointment her is with Estill Bamberg for his annual exam.

## 2014-03-16 ENCOUNTER — Other Ambulatory Visit: Payer: Self-pay | Admitting: Physician Assistant

## 2014-04-06 ENCOUNTER — Ambulatory Visit (INDEPENDENT_AMBULATORY_CARE_PROVIDER_SITE_OTHER): Payer: 59 | Admitting: Physician Assistant

## 2014-04-06 ENCOUNTER — Encounter: Payer: Self-pay | Admitting: Physician Assistant

## 2014-04-06 ENCOUNTER — Other Ambulatory Visit: Payer: Self-pay | Admitting: Physician Assistant

## 2014-04-06 VITALS — BP 140/82 | HR 68 | Temp 97.7°F | Resp 16 | Ht 74.0 in | Wt 262.0 lb

## 2014-04-06 DIAGNOSIS — E1029 Type 1 diabetes mellitus with other diabetic kidney complication: Secondary | ICD-10-CM

## 2014-04-06 DIAGNOSIS — E876 Hypokalemia: Secondary | ICD-10-CM

## 2014-04-06 DIAGNOSIS — Z9989 Dependence on other enabling machines and devices: Secondary | ICD-10-CM

## 2014-04-06 DIAGNOSIS — K2100 Gastro-esophageal reflux disease with esophagitis, without bleeding: Secondary | ICD-10-CM

## 2014-04-06 DIAGNOSIS — N400 Enlarged prostate without lower urinary tract symptoms: Secondary | ICD-10-CM

## 2014-04-06 DIAGNOSIS — N182 Chronic kidney disease, stage 2 (mild): Secondary | ICD-10-CM

## 2014-04-06 DIAGNOSIS — R6889 Other general symptoms and signs: Secondary | ICD-10-CM

## 2014-04-06 DIAGNOSIS — E291 Testicular hypofunction: Secondary | ICD-10-CM

## 2014-04-06 DIAGNOSIS — G4733 Obstructive sleep apnea (adult) (pediatric): Secondary | ICD-10-CM

## 2014-04-06 DIAGNOSIS — N521 Erectile dysfunction due to diseases classified elsewhere: Secondary | ICD-10-CM

## 2014-04-06 DIAGNOSIS — Z125 Encounter for screening for malignant neoplasm of prostate: Secondary | ICD-10-CM

## 2014-04-06 DIAGNOSIS — E669 Obesity, unspecified: Secondary | ICD-10-CM

## 2014-04-06 DIAGNOSIS — K439 Ventral hernia without obstruction or gangrene: Secondary | ICD-10-CM

## 2014-04-06 DIAGNOSIS — E785 Hyperlipidemia, unspecified: Secondary | ICD-10-CM

## 2014-04-06 DIAGNOSIS — R9431 Abnormal electrocardiogram [ECG] [EKG]: Secondary | ICD-10-CM

## 2014-04-06 DIAGNOSIS — I10 Essential (primary) hypertension: Secondary | ICD-10-CM

## 2014-04-06 DIAGNOSIS — E559 Vitamin D deficiency, unspecified: Secondary | ICD-10-CM

## 2014-04-06 DIAGNOSIS — E1169 Type 2 diabetes mellitus with other specified complication: Secondary | ICD-10-CM | POA: Insufficient documentation

## 2014-04-06 DIAGNOSIS — Z0001 Encounter for general adult medical examination with abnormal findings: Secondary | ICD-10-CM

## 2014-04-06 LAB — CBC WITH DIFFERENTIAL/PLATELET
BASOS PCT: 1 % (ref 0–1)
Basophils Absolute: 0.1 10*3/uL (ref 0.0–0.1)
EOS PCT: 4 % (ref 0–5)
Eosinophils Absolute: 0.2 10*3/uL (ref 0.0–0.7)
HCT: 45.2 % (ref 39.0–52.0)
Hemoglobin: 15.7 g/dL (ref 13.0–17.0)
LYMPHS ABS: 1.3 10*3/uL (ref 0.7–4.0)
Lymphocytes Relative: 24 % (ref 12–46)
MCH: 28.5 pg (ref 26.0–34.0)
MCHC: 34.7 g/dL (ref 30.0–36.0)
MCV: 82.2 fL (ref 78.0–100.0)
MPV: 10.2 fL (ref 8.6–12.4)
Monocytes Absolute: 0.4 10*3/uL (ref 0.1–1.0)
Monocytes Relative: 8 % (ref 3–12)
NEUTROS ABS: 3.5 10*3/uL (ref 1.7–7.7)
NEUTROS PCT: 63 % (ref 43–77)
Platelets: 200 10*3/uL (ref 150–400)
RBC: 5.5 MIL/uL (ref 4.22–5.81)
RDW: 13.7 % (ref 11.5–15.5)
WBC: 5.5 10*3/uL (ref 4.0–10.5)

## 2014-04-06 LAB — HEMOGLOBIN A1C
HEMOGLOBIN A1C: 7.9 % — AB (ref <5.7)
Mean Plasma Glucose: 180 mg/dL — ABNORMAL HIGH (ref <117)

## 2014-04-06 NOTE — Patient Instructions (Addendum)
Ph[facility] [phone]- can get a prescription and call this number to see if you can get cheaper medications from here. Call about Cialis.    Diabetes is a very complicated disease...lets simplify it.  An easy way to look at it to understand the complications is if you think of the extra sugar floating in your blood stream as glass shards floating through your blood stream.    Diabetes affects your small vessels first: 1) The glass shards (sugar) scraps down the tiny blood vessels in your eyes and lead to diabetic retinopathy, the leading cause of blindness in the [place]. Diabetes is the leading cause of newly diagnosed adult (59 to 60 years of age) blindness in the [place].  2) The glass shards scratches down the tiny vessels of your legs leading to nerve damage called neuropathy and can lead to amputations of your feet. More than 60% of all non-traumatic amputations of lower limbs occur in people with diabetes.  3) Over time the small vessels in your brain are shredded and closed off, individually this does not cause any problems but over a long period of time many of the small vessels being blocked can lead to Vascular Dementia.   4) Your kidney's are a filter system and have a "net" that keeps certain things in the body and lets bad things out. Sugar shreds this net and leads to kidney damage and eventually failure. Decreasing the sugar that is destroying the net and certain blood pressure medications can help stop or decrease progression of kidney disease. Diabetes was the primary cause of kidney failure in 44 percent of all new cases in 2011.  5) Diabetes also destroys the small vessels in your penis that lead to erectile dysfunction. Eventually the vessels are so damaged that you may not be responsive to cialis or viagra.   Diabetes and your large vessels: Your larger vessels consist of your coronary arteries in your heart and the carotid vessels to your brain. Diabetes or even  increased sugars put you at 300% increased risk of heart attack and stroke and this is why.. The sugar scrapes down your large blood vessels and your body sees this as an internal injury and tries to repair itself. Just like you get a scab on your skin, your platelets will stick to the blood vessel wall trying to heal it. This is why we have diabetics on low dose aspirin daily, this prevents the platelets from sticking and can prevent plaque formation. In addition, your body takes cholesterol and tries to shove it into the open wound. This is why we want your LDL, or bad cholesterol, below 70.   The combination of platelets and cholesterol over 5-10 years forms plaque that can break off and cause a heart attack or stroke.   PLEASE REMEMBER:  Diabetes is preventable! Up to 9 percent of complications and morbidities among individuals with type 2 diabetes can be prevented, delayed, or effectively treated and minimized with regular visits to a health professional, appropriate monitoring and medication, and a healthy diet and lifestyle.  Before you even begin to attack a weight-loss plan, it pays to remember this: You are not fat. You have fat. Losing weight isn't about blame or shame; it's simply another achievement to accomplish. Dieting is like any other skill-you have to buckle down and work at it. As long as you act in a smart, reasonable way, you'll ultimately get where you want to be. Here are some weight loss pearls  for you.  1. It's Not a Diet. It's a Lifestyle Thinking of a diet as something you're on and suffering through only for the short term doesn't work. To shed weight and keep it off, you need to make permanent changes to the way you eat. It's OK to indulge occasionally, of course, but if you cut calories temporarily and then revert to your old way of eating, you'll gain back the weight quicker than you can say yo-yo. Use it to lose it. Research shows that one of the best predictors of  long-term weight loss is how many pounds you drop in the first month. For that reason, nutritionists often suggest being stricter for the first two weeks of your new eating strategy to build momentum. Cut out added sugar and alcohol and avoid unrefined carbs. After that, figure out how you can reincorporate them in a way that's healthy and maintainable.  2. There's a Right Way to Exercise Working out burns calories and fat and boosts your metabolism by building muscle. But those trying to lose weight are notorious for overestimating the number of calories they burn and underestimating the amount they take in. Unfortunately, your system is biologically programmed to hold on to extra pounds and that means when you start exercising, your body senses the deficit and ramps up its hunger signals. If you're not diligent, you'll eat everything you burn and then some. Use it to lose it. Cardio gets all the exercise glory, but strength and interval training are the real heroes. They help you build lean muscle, which in turn increases your metabolism and calorie-burning ability 3. Don't Overreact to Mild Hunger Some people have a hard time losing weight because of hunger anxiety. To them, being hungry is bad-something to be avoided at all costs-so they carry snacks with them and eat when they don't need to. Others eat because they're stressed out or bored. While you never want to get to the point of being ravenous (that's when bingeing is likely to happen), a hunger pang, a craving, or the fact that it's 3:00 p.m. should not send you racing for the vending machine or obsessing about the energy bar in your purse. Ideally, you should put off eating until your stomach is growling and it's difficult to concentrate.  Use it to lose it. When you feel the urge to eat, use the HALT method. Ask yourself, Am I really hungry? Or am I angry or anxious, lonely or bored, or tired? If you're still not certain, try the apple test. If  you're truly hungry, an apple should seem delicious; if it doesn't, something else is going on. Or you can try drinking water and making yourself busy, if you are still hungry try a healthy snack.  4. Not All Calories Are Created Equal The mechanics of weight loss are pretty simple: Take in fewer calories than you use for energy. But the kind of food you eat makes all the difference. Processed food that's high in saturated fat and refined starch or sugar can cause inflammation that disrupts the hormone signals that tell your brain you're full. The result: You eat a lot more.  Use it to lose it. Clean up your diet. Swap in whole, unprocessed foods, including vegetables, lean protein, and healthy fats that will fill you up and give you the biggest nutritional bang for your calorie buck. In a few weeks, as your brain starts receiving regular hunger and fullness signals once again, you'll notice that you feel less  hungry overall and naturally start cutting back on the amount you eat.  5. Protein, Produce, and Plant-Based Fats Are Your Weight-Loss Trinity Here's why eating the three Ps regularly will help you drop pounds. Protein fills you up. You need it to build lean muscle, which keeps your metabolism humming so that you can torch more fat. People in a weight-loss program who ate double the recommended daily allowance for protein (about 110 grams for a 150-pound woman) lost 70 percent of their weight from fat, while people who ate the RDA lost only about 40 percent, one study found. Produce is packed with filling fiber. "It's very difficult to consume too many calories if you're eating a lot of vegetables. Example: Three cups of broccoli is a lot of food, yet only 93 calories. (Fruit is another story. It can be easy to overeat and can contain a lot of calories from sugar, so be sure to monitor your intake.) Plant-based fats like olive oil and those in avocados and nuts are healthy and extra satiating.  Use  it to lose it. Aim to incorporate each of the three Ps into every meal and snack. People who eat protein throughout the day are able to keep weight off, according to a study in the [place] of Clinical Nutrition. In addition to meat, poultry and seafood, good sources are beans, lentils, eggs, tofu, and yogurt. As for fat, keep portion sizes in check by measuring out salad dressing, oil, and nut butters (shoot for one to two tablespoons). Finally, eat veggies or a little fruit at every meal. People who did that consumed 308 fewer calories but didn't feel any hungrier than when they didn't eat more produce.  7. How You Eat Is As Important As What You Eat In order for your brain to register that you're full, you need to focus on what you're eating. Sit down whenever you eat, preferably at a table. Turn off the TV or computer, put down your phone, and look at your food. Smell it. Chew slowly, and don't put another bite on your fork until you swallow. When women ate lunch this attentively, they consumed 30 percent less when snacking later than those who listened to an audiobook at lunchtime, according to a study in the [place] of Nutrition. 8. Weighing Yourself Really Works The scale provides the best evidence about whether your efforts are paying off. Seeing the numbers tick up or down or stagnate is motivation to keep going-or to rethink your approach. A 2015 study at [facility] found that daily weigh-ins helped people lose more weight, keep it off, and maintain that loss, even after two years. Use it to lose it. Step on the scale at the same time every day for the best results. If your weight shoots up several pounds from one weigh-in to the next, don't freak out. Eating a lot of salt the night before or having your period is the likely culprit. The number should return to normal in a day or two. It's a steady climb that you need to do something about. 9. Too Much Stress and Too Little  Sleep Are Your Enemies When you're tired and frazzled, your body cranks up the production of cortisol, the stress hormone that can cause carb cravings. Not getting enough sleep also boosts your levels of ghrelin, a hormone associated with hunger, while suppressing leptin, a hormone that signals fullness and satiety. People on a diet who slept only five and a half hours a night  for two weeks lost 55 percent less fat and were hungrier than those who slept eight and a half hours, according to a study in the [place]. Use it to lose it. Prioritize sleep, aiming for seven hours or more a night, which research shows helps lower stress. And make sure you're getting quality zzz's. If a snoring spouse or a fidgety cat wakes you up frequently throughout the night, you may end up getting the equivalent of just four hours of sleep, according to a study from [facility]. Keep pets out of the bedroom, and use a white-noise app to drown out snoring. 10. You Will Hit a plateau-And You Can Bust Through It As you slim down, your body releases much less leptin, the fullness hormone.  If you're not strength training, start right now. Building muscle can raise your metabolism to help you overcome a plateau. To keep your body challenged and burning calories, incorporate new moves and more intense intervals into your workouts or add another sweat session to your weekly routine. Alternatively, cut an extra 100 calories or so a day from your diet. Now that you've lost weight, your body simply doesn't need as much fuel.    If you are traveling you can take these medications to be more prepared. If you get chest pain, shortness of breath or abdominal pain please go to the hospital wherever you may be.   Ciprofloxacin is good for travelers diarrhea, you can take 2 pills a day for 7 days. Or it is also good for urinary tract infections, you can take 2 a day for 7 days.  Zpak- is good for sinus  infections please finish as prescribed Phenergran is for nausea but it sedating so plan on eating and sleeping.  Levsin is good for nausea, diarrhea, or abdominal cramping- it can constipate you so don't take too much. This dissolves under your tongue.  Prednisone is good for joint pain or rashes or spider bites- you can take it as prescribed but if you are feeling better you can stop it early.  Diflucan is for a yeast infection.

## 2014-04-06 NOTE — Progress Notes (Signed)
Complete Physical  Assessment and Plan: 1. Essential hypertension - continue medications, DASH diet, exercise and monitor at home. Call if greater than 130/80.  - CBC with Differential/Platelet - Hepatic function panel - TSH - Urinalysis, Routine w reflex microscopic - Microalbumin / creatinine urine ratio - EKG 12-Lead - [place], RETROPERITNL ABD,  LTD  2. 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 - Hemoglobin A1c - Insulin, fasting - HM DIABETES FOOT EXAM  3. CKD (chronic kidney disease) stage 2, GFR 60-89 ml/min Increase fluids, avoid NSAIDS, monitor sugars, will monitor - BASIC METABOLIC PANEL WITH GFR  4. Obesity (BMI 33) Obesity with co morbidities- long discussion about weight loss, diet, and exercise - Testosterone  5. Hyperlipidemia -continue medications, check lipids, decrease fatty foods, increase activity.  - Lipid panel  6. Vitamin D deficiency - Vit D  25 hydroxy (rtn osteoporosis monitoring)  7. Nonspecific abnormal electrocardiogram (ECG) (EKG) Nonspecific ST changes, high risk,  Some SOB with exertion but possible from obesity/deconditioning, getting stress test in 3 weeks, Will go to ER if any CP, SOB, nausea, dizziness, severe HA, changes vision/speech  8. Gastroesophageal reflux disease with esophagitis Continue PPI/H2 blocker, diet discussed  9. OSA on CPAP Sleep apnea- continue CPAP, weight loss advised.   10. Ventral hernia without obstruction or gangrene monitor  11. BPH (benign prostatic hypertrophy) Okay for now, will monitor  12. Erectile dysfunction associated with type 2 diabetes mellitus Cialis PRN, warned about this medication and heart risks  13. Hypokalemia Check potassium - Magnesium  14. Prostate cancer  screening - PSA  Discussed med's effects and SE's. Screening labs and tests as requested with regular follow-up as recommended.  HPI Patient presents for a complete physical.   His blood pressure has not been checked at home, today their BP is BP: 140/82 mmHg He does not workout, told not to workout so has been doing minimal activity. He denies chest pain, shortness of breath, dizziness.  States about 2-3 months ago, when on a site walk and walked down/up a hill without only SOB. He admits to more fatigue in the evenings for past 2-3 months. Denies any CP, nausea, dizziness, diaphoresis.  He had a CPE at work and had possible abnormal EKG, patient was sent here and evaluated and the EKG was thought to be normal. He is high risk for CAD due to DM. He is scheduled for a stress test in cardiology in 3 weeks, [name].  He is on cholesterol medication, lipitor 40mg  and denies myalgias. His cholesterol is at goal. The cholesterol last visit was:   Lab Results  Component Value Date   CHOL 153 11/28/2013   HDL 43 11/28/2013   LDLCALC 54 11/28/2013   TRIG 280* 11/28/2013   CHOLHDL 3.6 11/28/2013   He has been working on diet and exercise for insulin dependent diabetes with CKD stage 2, he has had DM x 30 years, he is on bASA, he is on ACE/ARB, he is checking his sugars at night, they run higher at night then in the morning, he will occ miss lunch/night time insulin and denies paresthesia of the feet, polydipsia, polyuria and visual disturbances. Last A1C in the office was:  Lab Results  Component Value Date   HGBA1C 7.9* 11/28/2013  Patient is on Vitamin D supplement.   Lab Results  Component  Value Date   VD25OH 103 11/28/2013    Last PSA was: Lab Results  Component Value Date   PSA 2.18 04/06/2013  He has BPH, but is not on anything for this.  He has ED,  and he is on cialis PRN.  He has GERD, is on prilosec.  Has history of hypokalemia, he is on Kdur 20 BID. Lab Results  Component  Value Date   CREATININE 1.09 01/02/2014   BUN 16 01/02/2014   NA 138 01/02/2014   K 3.8 01/02/2014   CL 98 01/02/2014   CO2 30 01/02/2014   BMI is Body mass index is 33.62 kg/(m^2)., he is working on diet and exercise. Due to his obesity he has OSA and is on CPAP.  Wt Readings from Last 3 Encounters:  04/06/14 262 lb (118.842 kg)  02/17/14 257 lb 6.4 oz (116.756 kg)  11/28/13 262 lb (118.842 kg)     Current Medications:  Current Outpatient Prescriptions on File Prior to Visit  Medication Sig Dispense Refill  . aspirin 81 MG tablet Take 81 mg by mouth daily.    Marland Kitchen atorvastatin (LIPITOR) 40 MG tablet TAKE 1 TABLET DAILY FOR CHOLESTEROL 90 tablet 0  . BD INSULIN SYRINGE ULTRAFINE 31G X 5/16" 0.5 ML MISC USE TWICE A DAY AS DIRECTED 200 each 2  . bisoprolol-hydrochlorothiazide (ZIAC) 5-6.25 MG per tablet TAKE 1 TABLET DAILY FOR BLOOD PRESSURE 90 tablet 1  . CIALIS 20 MG tablet TAKE ONE-HALF (1/2) TABLET TO 1 TABLET EVERY 2 TO 3 DAYS AS NEEDED 30 tablet 1  . insulin glargine (LANTUS) 100 UNIT/ML injection Inject 0.6 mLs (60 Units total) into the skin at bedtime. 7 vial 3  . insulin lispro (HUMALOG) 100 UNIT/ML injection INJECT 60 -80  UNITS THREE TIMES A DAY BEFORE MEALS OR AS DIRECTED 60 mL 3  . losartan-hydrochlorothiazide (HYZAAR) 100-25 MG per tablet     . metFORMIN (GLUCOPHAGE) 500 MG tablet Take 500 mg by mouth 3 (three) times daily.    Marland Kitchen omeprazole (PRILOSEC) 20 MG capsule TAKE 1 CAPSULE DAILY FOR ACID REFLUX 90 capsule 0  . ONE TOUCH ULTRA TEST test strip USE TO TAKE GLUCOSE THREE TO FOUR TIMES A DAY FOR FLUCTUATING BLOOD SUGARS 100 each 4  . potassium chloride SA (K-DUR,KLOR-CON) 20 MEQ tablet Take 1 tablet (20 mEq total) by mouth 2 (two) times daily. 180 tablet 1   No current facility-administered medications on file prior to visit.   Health Maintenance:  Immunization History  Administered Date(s) Administered  . Influenza Split 11/28/2013  . Influenza-Unspecified 12/19/2011,  01/04/2013  . Pneumococcal Polysaccharide-23 03/03/2006  . Td 03/03/2008  . Zoster 02/07/2011   Tetanus: 2010 Pneumovax: 2008 Flu vaccine: 11/2013 Zostavax: 2012 DEXA: N/A Colonoscopy: 12/2013 (Dr. Cristina Gong) EGD: N/A Eye Exam: Dr. Gershon Crane q yearly Dentist: Dr. Ronita Hipps, [place]  Patient Care Team: Unk Pinto, MD as PCP - General (Internal Medicine) Eulis Manly. Gershon Crane, MD as Consulting Physician (Ophthalmology) Cleotis Nipper, MD as Consulting Physician (Gastroenterology) Larey Dresser, MD as Consulting Physician (Cardiology)  Allergies:  Allergies  Allergen Reactions  . Crestor [Rosuvastatin]    Medical History:  Past Medical History  Diagnosis Date  . Diabetes mellitus without complication 3875    insulin requiring  . Hyperlipidemia   . Hypertension   . OSA on CPAP     10-12 years ago  . Vitamin D deficiency   . GERD (gastroesophageal reflux disease)   . Hypokalemia   . CKD (chronic kidney disease)  stage 2, GFR 60-89 ml/min     secondary to DM  . Hypomagnesemia    Surgical History:  Past Surgical History  Procedure Laterality Date  . Anal fissure repair  2007  . Ventral hernia repair  04/13/12    Dr. Johney Maine, ventral hernia repair w/ mesh   Family History:  Family History  Problem Relation Age of Onset  . Cancer Father     kidney   Social History:   History  Substance Use Topics  . Smoking status: Never Smoker   . Smokeless tobacco: Not on file  . Alcohol Use: 0.0 oz/week    2-5 Cans of beer per week     Comment: weekly   Review of Systems:  Review of Systems  Constitutional: Negative.   HENT: Negative.   Eyes: Negative.   Respiratory: Positive for shortness of breath (with exertion). Negative for cough, hemoptysis, sputum production and wheezing.   Cardiovascular: Negative.   Gastrointestinal: Negative.  Heartburn: better with PPI.  Genitourinary: Negative.   Musculoskeletal: Negative.   Skin: Negative.   Neurological: Negative.    Endo/Heme/Allergies: Negative.   Psychiatric/Behavioral: Negative.     Physical Exam: Estimated body mass index is 33.62 kg/(m^2) as calculated from the following:   Height as of this encounter: 6\' 2"  (1.88 m).   Weight as of this encounter: 262 lb (118.842 kg). BP 140/82 mmHg  Pulse 68  Temp(Src) 97.7 F (36.5 C)  Resp 16  Ht 6\' 2"  (1.88 m)  Wt 262 lb (118.842 kg)  BMI 33.62 kg/m2 General Appearance: Well nourished, in no apparent distress.  Eyes: PERRLA, EOMs, conjunctiva no swelling or erythema, normal fundi and vessels.  Sinuses: No Frontal/maxillary tenderness  ENT/Mouth: Ext aud canals clear, normal light reflex with TMs without erythema, bulging. Good dentition. No erythema, swelling, or exudate on post pharynx. Tonsils not swollen or erythematous. Hearing normal.  Neck: Supple, thyroid normal. No bruits  Respiratory: Respiratory effort normal, BS equal bilaterally without rales, rhonchi, wheezing or stridor.  Cardio: RRR without murmurs, rubs or gallops, decreased HS due to body habitus. Brisk peripheral pulses without edema.  Chest: symmetric, with normal excursions and percussion.  Abdomen: Soft, nontender, obese, + ventral hernia, no guarding, rebound, masses, or organomegaly. .  Lymphatics: Non tender without lymphadenopathy.  Genitourinary: defer Musculoskeletal: Full ROM all peripheral extremities,5/5 strength, and normal gait.  Skin: Warm, dry without rashes, lesions, ecchymosis. Neuro: Cranial nerves intact, reflexes equal bilaterally. Normal muscle tone, no cerebellar symptoms. Sensation intact.  Psych: Awake and oriented X 3, normal affect, Insight and Judgment appropriate.   EKG: IRBBB, PRWP, non specific T wave inversion V5,V6, questionable LAE, no concerning ST changes.  AORTA SCAN: WNL  Vicie Mutters 9:04 AM [facility]

## 2014-04-07 ENCOUNTER — Encounter: Payer: Self-pay | Admitting: Physician Assistant

## 2014-04-07 LAB — TSH: TSH: 2.292 u[IU]/mL (ref 0.350–4.500)

## 2014-04-07 LAB — LIPID PANEL
CHOL/HDL RATIO: 3 ratio
Cholesterol: 147 mg/dL (ref 0–200)
HDL: 49 mg/dL (ref >39)
LDL CALC: 75 mg/dL (ref 0–99)
Triglycerides: 114 mg/dL (ref <150)
VLDL: 23 mg/dL (ref 0–40)

## 2014-04-07 LAB — BASIC METABOLIC PANEL WITH GFR
BUN: 18 mg/dL (ref 6–23)
CHLORIDE: 99 meq/L (ref 96–112)
CO2: 24 mEq/L (ref 19–32)
CREATININE: 1.03 mg/dL (ref 0.50–1.35)
Calcium: 9.6 mg/dL (ref 8.4–10.5)
GFR, Est African American: >89 mL/min
GFR, Est Non African American: 79 mL/min
Glucose, Bld: 142 mg/dL — ABNORMAL HIGH (ref 70–99)
Potassium: 3.6 mEq/L (ref 3.5–5.3)
Sodium: 138 mEq/L (ref 135–145)

## 2014-04-07 LAB — HEPATIC FUNCTION PANEL
ALBUMIN: 3.9 g/dL (ref 3.5–5.2)
ALT: 17 U/L (ref 0–53)
AST: 16 U/L (ref 0–37)
Alkaline Phosphatase: 83 U/L (ref 39–117)
BILIRUBIN DIRECT: 0.2 mg/dL (ref 0.0–0.3)
BILIRUBIN INDIRECT: 0.6 mg/dL (ref 0.2–1.2)
BILIRUBIN TOTAL: 0.8 mg/dL (ref 0.2–1.2)
Total Protein: 6.5 g/dL (ref 6.0–8.3)

## 2014-04-07 LAB — URINALYSIS, ROUTINE W REFLEX MICROSCOPIC
Bilirubin Urine: NEGATIVE
GLUCOSE, UA: 100 mg/dL — AB
Hgb urine dipstick: NEGATIVE
Ketones, ur: NEGATIVE mg/dL
Leukocytes, UA: NEGATIVE
Nitrite: NEGATIVE
PH: 6 (ref 5.0–8.0)
Protein, ur: NEGATIVE mg/dL
SPECIFIC GRAVITY, URINE: 1.017 (ref 1.005–1.030)
Urobilinogen, UA: 0.2 mg/dL (ref 0.0–1.0)

## 2014-04-07 LAB — MICROALBUMIN / CREATININE URINE RATIO
CREATININE, URINE: 109.2 mg/dL
Microalb Creat Ratio: 2.7 mg/g (ref 0.0–30.0)
Microalb, Ur: 0.3 mg/dL (ref <2.0)

## 2014-04-07 LAB — PSA: PSA: 2.81 ng/mL (ref <=4.00)

## 2014-04-07 LAB — INSULIN, FASTING: Insulin fasting, serum: 60.2 u[IU]/mL — ABNORMAL HIGH (ref 2.0–19.6)

## 2014-04-07 LAB — MAGNESIUM: MAGNESIUM: 1.9 mg/dL (ref 1.5–2.5)

## 2014-04-07 LAB — TESTOSTERONE: TESTOSTERONE: 288 ng/dL — AB (ref 300–890)

## 2014-04-07 LAB — VITAMIN D 25 HYDROXY (VIT D DEFICIENCY, FRACTURES): Vit D, 25-Hydroxy: 46 ng/mL (ref 30–100)

## 2014-04-26 ENCOUNTER — Encounter: Payer: Self-pay | Admitting: *Deleted

## 2014-04-26 ENCOUNTER — Institutional Professional Consult (permissible substitution): Payer: PRIVATE HEALTH INSURANCE | Admitting: Cardiology

## 2014-04-26 ENCOUNTER — Ambulatory Visit (INDEPENDENT_AMBULATORY_CARE_PROVIDER_SITE_OTHER): Payer: 59 | Admitting: Cardiology

## 2014-04-26 ENCOUNTER — Encounter: Payer: Self-pay | Admitting: Cardiology

## 2014-04-26 VITALS — BP 112/62 | HR 56 | Ht 74.0 in | Wt 261.0 lb

## 2014-04-26 DIAGNOSIS — R0609 Other forms of dyspnea: Secondary | ICD-10-CM

## 2014-04-26 DIAGNOSIS — E785 Hyperlipidemia, unspecified: Secondary | ICD-10-CM

## 2014-04-26 DIAGNOSIS — R0602 Shortness of breath: Secondary | ICD-10-CM

## 2014-04-26 DIAGNOSIS — I10 Essential (primary) hypertension: Secondary | ICD-10-CM

## 2014-04-26 NOTE — Patient Instructions (Signed)
Your physician has requested that you have en exercise stress myoview. For further information please visit [email or web address]. Please follow instruction sheet, as given.  Your physician wants you to follow-up in: 1 year with Dr Aundra Dubin. (February 2017).  You will receive a reminder letter in the mail two months in advance. If you don't receive a letter, please call our office to schedule the follow-up appointment.

## 2014-04-26 NOTE — Progress Notes (Signed)
Patient ID: Harry [name], male   DOB: 23-Nov-1955, 59 y.o.   MRN: [id number] PCP: Dr. Melford Aase  59 y.o. with history of type II diabetes x years, HTN, and hyperlipidemia presents for cardiology evaluation.  At his company physical a couple of months ago, he was told that he had an abnormal ECG and needs a stress test.  He is symptomatically stable.  No chest pain.  He is short of breath walking up hills/inclines.  Mild dyspnea with stairs.  No problems walking on flat ground.  No orthopnea/PND.  No lightheadedness or palpitations.  Generally, he is more fatigued than in the past.  He has had no prior cardiac issues.    ECG: NSR, anterolateral nonspecific T wave flattening (similar to ECG at PCP's office recently).   Labs (2/16): K 3.6, creatinine 1.03, HCT 45.2, LDL 75, HDL 49, TSH normal  PMH: 1. HTN 2. Type II diabetes: x 25 yrs 3. CKD stage II: Mild diabetic nephropathy.  4. Obesity 5. Hyperlipidemia 6. GERD 7. OSA: On CPAP.  8. Ventral hernia 9. BPH 10. Erectile dysfunction .  SH: Lives in [place], married, works for [employer], no smoking.   FH: No heart disease.  Multiple family members with cancer.   ROS: All systems reviewed and negative except as per HPI.  Current Outpatient Prescriptions  Medication Sig Dispense Refill  . aspirin 81 MG tablet Take 81 mg by mouth daily.    Marland Kitchen atorvastatin (LIPITOR) 40 MG tablet TAKE 1 TABLET DAILY FOR CHOLESTEROL 90 tablet 1  . BD INSULIN SYRINGE ULTRAFINE 31G X 5/16" 0.5 ML MISC USE TWICE A DAY AS DIRECTED 200 each 2  . bisoprolol-hydrochlorothiazide (ZIAC) 5-6.25 MG per tablet TAKE 1 TABLET DAILY FOR BLOOD PRESSURE 90 tablet 1  . CIALIS 20 MG tablet TAKE ONE-HALF (1/2) TABLET TO 1 TABLET EVERY 2 TO 3 DAYS AS NEEDED 30 tablet 1  . insulin glargine (LANTUS) 100 UNIT/ML injection Inject 0.6 mLs (60 Units total) into the skin at bedtime. 7 vial 3  . insulin lispro (HUMALOG) 100 UNIT/ML injection INJECT 60 -80  UNITS THREE TIMES A DAY  BEFORE MEALS OR AS DIRECTED 60 mL 3  . losartan-hydrochlorothiazide (HYZAAR) 100-25 MG per tablet     . metFORMIN (GLUCOPHAGE) 500 MG tablet Take 500 mg by mouth 3 (three) times daily.    Marland Kitchen omeprazole (PRILOSEC) 20 MG capsule TAKE 1 CAPSULE DAILY FOR ACID REFLUX 90 capsule 1  . ONE TOUCH ULTRA TEST test strip USE TO TAKE GLUCOSE THREE TO FOUR TIMES A DAY FOR FLUCTUATING BLOOD SUGARS 100 each 4  . potassium chloride SA (K-DUR,KLOR-CON) 20 MEQ tablet Take 1 tablet (20 mEq total) by mouth 2 (two) times daily. 180 tablet 1   No current facility-administered medications for this visit.   BP 112/62 mmHg  Pulse 56  Ht 6\' 2"  (1.88 m)  Wt 261 lb (118.389 kg)  BMI 33.50 kg/m2 General: NAD Neck: No JVD, no thyromegaly or thyroid nodule.  Lungs: Clear to auscultation bilaterally with normal respiratory effort. CV: Nondisplaced PMI.  Heart regular S1/S2, no S3/S4, no murmur.  No peripheral edema.  No carotid bruit.  Normal pedal pulses.  Abdomen: Soft, nontender, no hepatosplenomegaly, no distention.  Skin: Intact without lesions or rashes.  Neurologic: Alert and oriented x 3.  Psych: Normal affect. Extremities: No clubbing or cyanosis.  HEENT: Normal.   Assessment/Plan: 1. CAD risk: Patient has a number of risk factors for CAD, including type II diabetes, HTN, and  hyperlipidemia.  His ECG is abnormal but not markedly so.  He has no chest pain but does have dyspnea with moderate exertion.  - He should continue ASA 81 and statin.  - I will arrange ETT-Cardiolite to assess for evidence of obstructive CAD.  2. Hyperlipidemia: Good lipids in 2/16.  Given diabetes, I would aim for LDL < 70 if possible.  3. HTN: BP controlled.   Loralie Champagne 04/26/2014

## 2014-05-05 ENCOUNTER — Institutional Professional Consult (permissible substitution): Payer: PRIVATE HEALTH INSURANCE | Admitting: Cardiology

## 2014-05-06 ENCOUNTER — Other Ambulatory Visit: Payer: Self-pay | Admitting: Internal Medicine

## 2014-05-08 ENCOUNTER — Ambulatory Visit (HOSPITAL_COMMUNITY): Payer: 59 | Attending: Cardiology | Admitting: Radiology

## 2014-05-08 VITALS — BP 124/72 | Ht 74.0 in | Wt 261.0 lb

## 2014-05-08 DIAGNOSIS — R0602 Shortness of breath: Secondary | ICD-10-CM | POA: Insufficient documentation

## 2014-05-08 DIAGNOSIS — I493 Ventricular premature depolarization: Secondary | ICD-10-CM | POA: Diagnosis present

## 2014-05-08 MED ORDER — TECHNETIUM TC 99M SESTAMIBI GENERIC - CARDIOLITE
30.0000 | Freq: Once | INTRAVENOUS | Status: AC | PRN
  Administered 2014-05-08: 30 via INTRAVENOUS

## 2014-05-08 MED ORDER — TECHNETIUM TC 99M SESTAMIBI GENERIC - CARDIOLITE
10.0000 | Freq: Once | INTRAVENOUS | Status: AC | PRN
  Administered 2014-05-08: 10 via INTRAVENOUS

## 2014-05-08 NOTE — Progress Notes (Addendum)
Harry [name] [address] [place], Harry [name] [address] [phone]    Cardiology Nuclear Med Study  Harry [name] is a 59 y.o. male     MRN : [id number]     DOB: 1955-04-28  Procedure Date: 05/08/2014  Nuclear Med Background Indication for Stress Test:  Evaluation for Ischemia and Abnormal EKG History:  n/a Cardiac Risk Factors: Hypertension and IDDM  Symptoms:  DOE and Fatigue   Nuclear Pre-Procedure Caffeine/Decaff Intake:  None> 12 hrs NPO After: 7:30am   Lungs:  clear O2 Sat: 97% on room air. IV 0.9% NS with Angio Cath:  20g  IV Site: L Antecubital x 1, tolerated well IV Started by:  Irven Baltimore, RN  Chest Size (in):  48 Cup Size: n/a  Height: 6\' 2"  (1.88 m)  Weight:  261 lb (118.389 kg)  BMI:  Body mass index is 33.5 kg/(m^2). Tech Comments:  1/2 dose Lantus Insulin  last night. Fasting CBG was 94 at 7:00am today.No insulin or metformin this am. Patient held Ziac x 36 hrs. Irven Baltimore, RN.    Nuclear Med Study 1 or 2 day study: 1 day  Stress Test Type:  Stress  Reading MD: N/A  Order Authorizing Provider:  Loralie Champagne, MD  Resting Radionuclide: Technetium 48m Sestamibi  Resting Radionuclide Dose: 10.8 mCi   Stress Radionuclide:  Technetium 84m Sestamibi  Stress Radionuclide Dose: 33.0 mCi           Stress Protocol Rest HR: 60 Stress HR: 139  Rest BP: 124/72 Stress BP: 200/76  Exercise Time (min): 9:16 METS: 10.10   Predicted Max HR: 160 bpm % Max HR: 86.88 bpm Rate Pressure Product: 27800   Dose of Adenosine (mg):  n/a Dose of Lexiscan: n/a mg  Dose of Atropine (mg): n/a Dose of Dobutamine: n/a mcg/kg/min (at max HR)  Stress Test Technologist: Perrin Maltese, EMT-P  Nuclear Technologist:  Earl Many, CNMT     Rest Procedure:  Myocardial perfusion imaging was performed at rest 45 minutes following the intravenous administration of Technetium 73m Sestamibi. Rest ECG: SR, negative T waves in the inferolateral  leads.  Stress Procedure:  The patient exercised on the treadmill utilizing the Bruce Protocol for 9:16 minutes. The patient stopped due to fatigue, sob, and chest tightness.  Technetium 64m Sestamibi was injected at peak exercise and myocardial perfusion imaging was performed after a brief delay. Stress ECG: 0.5 horizontal STD in the inferolateral leads - nondiagnostic due to baseline changes. Frequent PVCs and nsVTs with the longest run 5 beats.  QPS Raw Data Images:  Normal; no motion artifact; normal heart/lung ratio. Stress Images:  Normal homogeneous uptake in all areas of the myocardium. Rest Images:  Normal homogeneous uptake in all areas of the myocardium. Subtraction (SDS):  No evidence of ischemia. Transient Ischemic Dilatation (Normal <1.22):  0.91 Lung/Heart Ratio (Normal <0.45):  0.36  Quantitative Gated Spect Images QGS EDV:  121 ml QGS ESV:  42 ml  Impression Exercise Capacity:  Excellent exercise capacity. BP Response:  Hypertensive blood pressure response. Clinical Symptoms:  Chest pain and dyspnea. ECG Impression:  Nondiagnostic ST changes due to baseline abnormalities. Frequent PVCs and short runs of VT with the longest run lasting 5 beats. Comparison with Prior Nuclear Study: No images to compare  Overall Impression:  Low risk stress nuclear study with no evidence of prior scar or ischemia..  LV Ejection Fraction: 65%.  LV Wall Motion:  NL LV Function; NL Wall  Motion  Harry [name] 05/08/2014   No scar or ischemia.  Low risk study over all.  He does have PVCs and very short NSVT runs.  I do not think cath needed at this time, will see back in followup to discuss PVCs/NSVT.  Would like echocardiogram done to make sure no regional WMAs or structural abnormalities.   Loralie Champagne 05/09/2014 9:37 AM

## 2014-05-09 NOTE — Progress Notes (Signed)
Notified of stress test results.  Will set up Echo and then follow up with [name].  He verbalizes understanding.

## 2014-05-09 NOTE — Addendum Note (Signed)
Addended by: Derek Mound E on: 05/09/2014 10:19 AM   Modules accepted: Orders

## 2014-05-11 ENCOUNTER — Other Ambulatory Visit: Payer: Self-pay | Admitting: Internal Medicine

## 2014-05-19 ENCOUNTER — Ambulatory Visit (HOSPITAL_COMMUNITY): Payer: 59 | Attending: Cardiology | Admitting: Cardiology

## 2014-05-19 DIAGNOSIS — I493 Ventricular premature depolarization: Secondary | ICD-10-CM | POA: Insufficient documentation

## 2014-05-19 DIAGNOSIS — R0602 Shortness of breath: Secondary | ICD-10-CM

## 2014-05-19 NOTE — Progress Notes (Signed)
Echo performed. 

## 2014-05-22 ENCOUNTER — Encounter: Payer: Self-pay | Admitting: Cardiology

## 2014-05-22 ENCOUNTER — Ambulatory Visit (INDEPENDENT_AMBULATORY_CARE_PROVIDER_SITE_OTHER): Payer: 59 | Admitting: Cardiology

## 2014-05-22 VITALS — BP 124/68 | HR 63 | Ht 74.0 in | Wt 260.0 lb

## 2014-05-22 DIAGNOSIS — R0609 Other forms of dyspnea: Secondary | ICD-10-CM

## 2014-05-22 DIAGNOSIS — I493 Ventricular premature depolarization: Secondary | ICD-10-CM

## 2014-05-22 DIAGNOSIS — I7121 Aneurysm of the ascending aorta, without rupture: Secondary | ICD-10-CM | POA: Insufficient documentation

## 2014-05-22 DIAGNOSIS — I77810 Thoracic aortic ectasia: Secondary | ICD-10-CM

## 2014-05-22 DIAGNOSIS — I712 Thoracic aortic aneurysm, without rupture: Secondary | ICD-10-CM

## 2014-05-22 LAB — BASIC METABOLIC PANEL
BUN: 19 mg/dL (ref 6–23)
CO2: 29 meq/L (ref 19–32)
Calcium: 9.4 mg/dL (ref 8.4–10.5)
Chloride: 101 mEq/L (ref 96–112)
Creatinine, Ser: 1.14 mg/dL (ref 0.40–1.50)
GFR: 69.6 mL/min (ref >60.00)
Glucose, Bld: 139 mg/dL — ABNORMAL HIGH (ref 70–99)
Potassium: 3.8 mEq/L (ref 3.5–5.1)
Sodium: 138 mEq/L (ref 135–145)

## 2014-05-22 NOTE — Progress Notes (Signed)
Patient ID: Harry [name], male   DOB: 1955/08/16, 59 y.o.   MRN: [id number] PCP: Dr. Melford Aase  59 yo with history of type II diabetes x years, HTN, and hyperlipidemia returns for cardiology followup.  At his company physical a couple of months ago, he was told that he had an abnormal ECG and needs a stress test.  He is symptomatically stable.  No chest pain.  He is short of breath walking up hills/inclines.  Mild dyspnea with stairs.  No problems walking on flat ground.  No orthopnea/PND.  No lightheadedness or palpitations.  Generally, he is more fatigued than in the past.  He has had no prior cardiac issues.    ETT-Cardiolite was done in 3/16.  This showed no ischemia or infarction but he had multiple PVCs and short runs NSVT.  Good exercise tolerance.  Given the PVCs on Cardiolite, echo was done to make sure heart was structurally normal.  This showed normal LV and RV systolic function, no significant valvular abnormalities.  The ascending aorta was mildly dilated to 4.2 cm.  Labs (2/16): K 3.6, creatinine 1.03, HCT 45.2, LDL 75, HDL 49, TSH normal  PMH: 1. HTN 2. Type II diabetes: x 25 yrs 3. CKD stage II: Mild diabetic nephropathy.  4. Obesity 5. Hyperlipidemia 6. GERD 7. OSA: On CPAP.  8. Ventral hernia 9. BPH 10. Erectile dysfunction.  11. PVCs: Echo (3/16) with EF 60-65%, normal RV size and systolic function, trileaflet aortic valve, ascending aorta 4.2 cm.   12. ETT-Cardiolite (3/16) with 9:16 exercise, PVCs/short run NSVT, nondiagnostic ST changes due to baseline abnormalities, normal perfusion images (no ischemic or infarction).   SH: Lives in [place], married, works for [employer], no smoking.   FH: No heart disease.  Multiple family members with cancer.   ROS: All systems reviewed and negative except as per HPI.  Current Outpatient Prescriptions  Medication Sig Dispense Refill  . aspirin 81 MG tablet Take 81 mg by mouth daily.    Marland Kitchen atorvastatin (LIPITOR) 40 MG  tablet TAKE 1 TABLET DAILY FOR CHOLESTEROL 90 tablet 1  . BD INSULIN SYRINGE ULTRAFINE 31G X 5/16" 0.5 ML MISC USE TWICE A DAY AS DIRECTED 20 each 1  . bisoprolol-hydrochlorothiazide (ZIAC) 5-6.25 MG per tablet TAKE 1 TABLET DAILY FOR BLOOD PRESSURE 90 tablet 0  . CIALIS 20 MG tablet TAKE ONE-HALF (1/2) TABLET TO 1 TABLET EVERY 2 TO 3 DAYS AS NEEDED 30 tablet 1  . insulin glargine (LANTUS) 100 UNIT/ML injection Inject 0.6 mLs (60 Units total) into the skin at bedtime. 7 vial 3  . insulin lispro (HUMALOG) 100 UNIT/ML injection INJECT 60 -80  UNITS THREE TIMES A DAY BEFORE MEALS OR AS DIRECTED 60 mL 3  . losartan-hydrochlorothiazide (HYZAAR) 100-25 MG per tablet     . metFORMIN (GLUCOPHAGE) 500 MG tablet Take 500 mg by mouth 3 (three) times daily.    Marland Kitchen omeprazole (PRILOSEC) 20 MG capsule TAKE 1 CAPSULE DAILY FOR ACID REFLUX 90 capsule 1  . ONE TOUCH ULTRA TEST test strip USE TO TAKE GLUCOSE THREE TO FOUR TIMES A DAY FOR FLUCTUATING BLOOD SUGARS 100 each 4  . potassium chloride SA (K-DUR,KLOR-CON) 20 MEQ tablet Take 1 tablet (20 mEq total) by mouth 2 (two) times daily. 180 tablet 1   No current facility-administered medications for this visit.   BP 124/68 mmHg  Pulse 63  Ht 6\' 2"  (1.88 m)  Wt 260 lb (117.935 kg)  BMI 33.37 kg/m2  SpO2 97%  General: NAD Neck: No JVD, no thyromegaly or thyroid nodule.  Lungs: Clear to auscultation bilaterally with normal respiratory effort. CV: Nondisplaced PMI.  Heart regular S1/S2, no S3/S4, no murmur.  No peripheral edema.  No carotid bruit.  Normal pedal pulses.  Abdomen: Soft, nontender, no hepatosplenomegaly, no distention.  Skin: Intact without lesions or rashes.  Neurologic: Alert and oriented x 3.  Psych: Normal affect. Extremities: No clubbing or cyanosis.   Assessment/Plan: 1. CAD risk: Patient has a number of risk factors for CAD, including type II diabetes, HTN, and hyperlipidemia.  His ECG is abnormal but not markedly so.  He has no chest pain  but does have dyspnea with moderate exertion.  ETT-Cardiolite showed no evidence for ischemia or infarction and exercise tolerance was good.  He did have PVCs and a short NSVT run.  - He should continue ASA 81 and statin.   2. Hyperlipidemia: Good lipids in 2/16.  Given diabetes, I would aim for LDL < 70 if possible.  3. HTN: BP controlled.  4. PVCs/short run NSVT: He says that he has been told in the past that he has PVCs.  He really does not feel them. He had an echo done that showed normal LV and RV size/systolic function with no evidence for valvular disease.   5. Dilated ascending aorta: The visualized portion of ascending aorta by echo was 4.2 cm. I will get MRA chest to assess the full thoracic aorta.  I suspect the dilation is related to HTN, he has a trileaflet aortic valve.   Loralie Champagne 05/22/2014

## 2014-05-22 NOTE — Patient Instructions (Signed)
Your physician recommends that you have  lab work today--BMET.  Schedule an appointment for an MRA of your chest.   Your physician wants you to follow-up in: 1 year with Dr Aundra Dubin. (March 2017). You will receive a reminder letter in the mail two months in advance. If you don't receive a letter, please call our office to schedule the follow-up appointment.

## 2014-05-24 ENCOUNTER — Other Ambulatory Visit: Payer: Self-pay

## 2014-05-24 MED ORDER — INSULIN SYRINGE-NEEDLE U-100 31G X 5/16" 0.5 ML MISC: Status: DC

## 2014-05-24 MED ORDER — "INSULIN SYRINGE-NEEDLE U-100 31G X 5/16"" 0.5 ML MISC": Status: DC

## 2014-06-14 ENCOUNTER — Inpatient Hospital Stay: Admission: RE | Admit: 2014-06-14 | Payer: 59 | Source: Ambulatory Visit

## 2014-06-14 ENCOUNTER — Other Ambulatory Visit: Payer: Self-pay | Admitting: Internal Medicine

## 2014-06-26 ENCOUNTER — Other Ambulatory Visit: Payer: Self-pay | Admitting: Physician Assistant

## 2014-06-26 ENCOUNTER — Encounter: Payer: Self-pay | Admitting: Physician Assistant

## 2014-06-26 MED ORDER — AZITHROMYCIN 250 MG PO TABS: ORAL_TABLET | ORAL | Status: DC

## 2014-06-26 MED ORDER — HYOSCYAMINE SULFATE 0.125 MG SL SUBL: 0.1250 mg | SUBLINGUAL_TABLET | SUBLINGUAL | Status: DC | PRN

## 2014-06-26 MED ORDER — PROMETHAZINE HCL 25 MG PO TABS: 25.0000 mg | ORAL_TABLET | Freq: Four times a day (QID) | ORAL | Status: DC | PRN

## 2014-06-26 MED ORDER — CIPROFLOXACIN HCL 500 MG PO TABS: 500.0000 mg | ORAL_TABLET | Freq: Two times a day (BID) | ORAL | Status: DC

## 2014-07-14 ENCOUNTER — Ambulatory Visit
Admission: RE | Admit: 2014-07-14 | Discharge: 2014-07-14 | Disposition: A | Discharge status: Home / Self Care | Payer: 59 | Source: Ambulatory Visit | Attending: Cardiology | Admitting: Cardiology

## 2014-07-14 DIAGNOSIS — I77810 Thoracic aortic ectasia: Secondary | ICD-10-CM

## 2014-07-14 MED ORDER — GADOBENATE DIMEGLUMINE 529 MG/ML IV SOLN
20.0000 mL | Freq: Once | INTRAVENOUS | Status: AC | PRN
  Administered 2014-07-14: 20 mL via INTRAVENOUS

## 2014-07-18 ENCOUNTER — Telehealth: Payer: Self-pay | Admitting: Cardiology

## 2014-07-18 NOTE — Telephone Encounter (Signed)
New problem    Pt returning your call and stating to call [phone] and ask for Harry [name] that's his nickname.

## 2014-07-18 NOTE — Telephone Encounter (Signed)
Spoke with patient about results of recent MRA chest.

## 2014-09-05 ENCOUNTER — Other Ambulatory Visit: Payer: Self-pay | Admitting: Internal Medicine

## 2014-09-29 ENCOUNTER — Other Ambulatory Visit: Payer: Self-pay | Admitting: Internal Medicine

## 2014-09-29 ENCOUNTER — Other Ambulatory Visit: Payer: Self-pay | Admitting: Physician Assistant

## 2014-10-26 ENCOUNTER — Other Ambulatory Visit: Payer: Self-pay | Admitting: *Deleted

## 2014-10-26 MED ORDER — INSULIN LISPRO 100 UNIT/ML ~~LOC~~ SOLN: SUBCUTANEOUS | Status: DC

## 2014-10-26 MED ORDER — INSULIN GLARGINE 100 UNIT/ML ~~LOC~~ SOLN: 60.0000 [IU] | Freq: Every day | SUBCUTANEOUS | Status: DC

## 2014-11-01 ENCOUNTER — Other Ambulatory Visit: Payer: Self-pay | Admitting: Physician Assistant

## 2014-11-02 ENCOUNTER — Other Ambulatory Visit: Payer: Self-pay | Admitting: Physician Assistant

## 2014-11-02 MED ORDER — INSULIN LISPRO 100 UNIT/ML ~~LOC~~ SOLN: SUBCUTANEOUS | Status: DC

## 2014-11-02 MED ORDER — INSULIN GLARGINE 100 UNIT/ML ~~LOC~~ SOLN: 60.0000 [IU] | Freq: Every day | SUBCUTANEOUS | Status: DC

## 2014-11-09 ENCOUNTER — Other Ambulatory Visit: Payer: Self-pay | Admitting: Physician Assistant

## 2014-11-13 ENCOUNTER — Other Ambulatory Visit: Payer: Self-pay | Admitting: *Deleted

## 2014-11-21 ENCOUNTER — Other Ambulatory Visit: Payer: Self-pay | Admitting: Internal Medicine

## 2014-12-06 ENCOUNTER — Ambulatory Visit (INDEPENDENT_AMBULATORY_CARE_PROVIDER_SITE_OTHER): Payer: 59 | Admitting: Physician Assistant

## 2014-12-06 ENCOUNTER — Encounter: Payer: Self-pay | Admitting: Physician Assistant

## 2014-12-06 VITALS — BP 140/60 | HR 65 | Temp 97.7°F | Resp 14 | Ht 74.0 in | Wt 264.0 lb

## 2014-12-06 DIAGNOSIS — Z79899 Other long term (current) drug therapy: Secondary | ICD-10-CM

## 2014-12-06 DIAGNOSIS — N521 Erectile dysfunction due to diseases classified elsewhere: Secondary | ICD-10-CM

## 2014-12-06 DIAGNOSIS — E876 Hypokalemia: Secondary | ICD-10-CM | POA: Diagnosis not present

## 2014-12-06 DIAGNOSIS — E1169 Type 2 diabetes mellitus with other specified complication: Secondary | ICD-10-CM

## 2014-12-06 DIAGNOSIS — Z23 Encounter for immunization: Secondary | ICD-10-CM

## 2014-12-06 DIAGNOSIS — E785 Hyperlipidemia, unspecified: Secondary | ICD-10-CM

## 2014-12-06 DIAGNOSIS — E669 Obesity, unspecified: Secondary | ICD-10-CM | POA: Diagnosis not present

## 2014-12-06 DIAGNOSIS — I10 Essential (primary) hypertension: Secondary | ICD-10-CM | POA: Diagnosis not present

## 2014-12-06 DIAGNOSIS — N182 Chronic kidney disease, stage 2 (mild): Secondary | ICD-10-CM

## 2014-12-06 DIAGNOSIS — E662 Morbid (severe) obesity with alveolar hypoventilation: Secondary | ICD-10-CM

## 2014-12-06 DIAGNOSIS — E1022 Type 1 diabetes mellitus with diabetic chronic kidney disease: Secondary | ICD-10-CM | POA: Diagnosis not present

## 2014-12-06 DIAGNOSIS — I712 Thoracic aortic aneurysm, without rupture: Secondary | ICD-10-CM | POA: Diagnosis not present

## 2014-12-06 DIAGNOSIS — E559 Vitamin D deficiency, unspecified: Secondary | ICD-10-CM | POA: Diagnosis not present

## 2014-12-06 DIAGNOSIS — I7121 Aneurysm of the ascending aorta, without rupture: Secondary | ICD-10-CM

## 2014-12-06 LAB — CBC WITH DIFFERENTIAL/PLATELET
Basophils Absolute: 0.1 10*3/uL (ref 0.0–0.1)
Basophils Relative: 1 % (ref 0–1)
Eosinophils Absolute: 0.1 10*3/uL (ref 0.0–0.7)
Eosinophils Relative: 2 % (ref 0–5)
HCT: 45.4 % (ref 39.0–52.0)
HEMOGLOBIN: 16 g/dL (ref 13.0–17.0)
Lymphocytes Relative: 16 % (ref 12–46)
Lymphs Abs: 1.2 10*3/uL (ref 0.7–4.0)
MCH: 28.7 pg (ref 26.0–34.0)
MCHC: 35.2 g/dL (ref 30.0–36.0)
MCV: 81.5 fL (ref 78.0–100.0)
MONO ABS: 0.4 10*3/uL (ref 0.1–1.0)
MONOS PCT: 6 % (ref 3–12)
MPV: 10.7 fL (ref 8.6–12.4)
NEUTROS ABS: 5.5 10*3/uL (ref 1.7–7.7)
Neutrophils Relative %: 75 % (ref 43–77)
Platelets: 215 10*3/uL (ref 150–400)
RBC: 5.57 MIL/uL (ref 4.22–5.81)
RDW: 13.7 % (ref 11.5–15.5)
WBC: 7.3 10*3/uL (ref 4.0–10.5)

## 2014-12-06 LAB — HEPATIC FUNCTION PANEL
ALK PHOS: 79 U/L (ref 40–115)
ALT: 18 U/L (ref 9–46)
AST: 16 U/L (ref 10–35)
Albumin: 4.1 g/dL (ref 3.6–5.1)
BILIRUBIN INDIRECT: 0.8 mg/dL (ref 0.2–1.2)
Bilirubin, Direct: 0.2 mg/dL (ref <=0.2)
TOTAL PROTEIN: 6.3 g/dL (ref 6.1–8.1)
Total Bilirubin: 1 mg/dL (ref 0.2–1.2)

## 2014-12-06 LAB — BASIC METABOLIC PANEL WITH GFR
BUN: 17 mg/dL (ref 7–25)
CALCIUM: 8.9 mg/dL (ref 8.6–10.3)
CO2: 30 mmol/L (ref 20–31)
CREATININE: 1.2 mg/dL (ref 0.70–1.25)
Chloride: 99 mmol/L (ref 98–110)
GFR, EST AFRICAN AMERICAN: 76 mL/min (ref >=60)
GFR, Est Non African American: 65 mL/min (ref >=60)
GLUCOSE: 159 mg/dL — AB (ref 65–99)
Potassium: 3.6 mmol/L (ref 3.5–5.3)
SODIUM: 138 mmol/L (ref 135–146)

## 2014-12-06 LAB — LIPID PANEL
Cholesterol: 153 mg/dL (ref 125–200)
HDL: 50 mg/dL (ref >=40)
LDL CALC: 72 mg/dL (ref <130)
Total CHOL/HDL Ratio: 3.1 Ratio (ref <=5.0)
Triglycerides: 154 mg/dL — ABNORMAL HIGH (ref <150)
VLDL: 31 mg/dL — AB (ref <30)

## 2014-12-06 LAB — TSH: TSH: 2.07 u[IU]/mL (ref 0.350–4.500)

## 2014-12-06 LAB — MAGNESIUM: MAGNESIUM: 1.8 mg/dL (ref 1.5–2.5)

## 2014-12-06 NOTE — Patient Instructions (Addendum)
Somogyi effect The brain needs two things: oxygen and sugar. If the blood sugar level drops too low in the early morning hours, hormones (such as growth hormone, cortisol, and catecholamines) are released to make sure you brain can still function. These help reverse the low blood sugar level but may lead to blood sugar levels that are higher than normal in the morning. This is common for patient that take insulin at night or do not ear regular snacks.  Please schedule to get up in the middle of the night to check your blood sugar.   This may be happening to you. Please eat a high protein night time snack and we will be decreasing your night time insulin as follows:   Diabetes is a very complicated disease...lets simplify it.  An easy way to look at it to understand the complications is if you think of the extra sugar floating in your blood stream as glass shards floating through your blood stream.    Diabetes affects your small vessels first: 1) The glass shards (sugar) scraps down the tiny blood vessels in your eyes and lead to diabetic retinopathy, the leading cause of blindness in the [place]. Diabetes is the leading cause of newly diagnosed adult (74 to 60 years of age) blindness in the [place].  2) The glass shards scratches down the tiny vessels of your legs leading to nerve damage called neuropathy and can lead to amputations of your feet. More than 60% of all non-traumatic amputations of lower limbs occur in people with diabetes.  3) Over time the small vessels in your brain are shredded and closed off, individually this does not cause any problems but over a long period of time many of the small vessels being blocked can lead to Vascular Dementia.   4) Your kidney's are a filter system and have a "net" that keeps certain things in the body and lets bad things out. Sugar shreds this net and leads to kidney damage and eventually failure. Decreasing the sugar that is destroying the net and  certain blood pressure medications can help stop or decrease progression of kidney disease. Diabetes was the primary cause of kidney failure in 44 percent of all new cases in 2011.  5) Diabetes also destroys the small vessels in your penis that lead to erectile dysfunction. Eventually the vessels are so damaged that you may not be responsive to cialis or viagra.   Diabetes and your large vessels: Your larger vessels consist of your coronary arteries in your heart and the carotid vessels to your brain. Diabetes or even increased sugars put you at 300% increased risk of heart attack and stroke and this is why.. The sugar scrapes down your large blood vessels and your body sees this as an internal injury and tries to repair itself. Just like you get a scab on your skin, your platelets will stick to the blood vessel wall trying to heal it. This is why we have diabetics on low dose aspirin daily, this prevents the platelets from sticking and can prevent plaque formation. In addition, your body takes cholesterol and tries to shove it into the open wound. This is why we want your LDL, or bad cholesterol, below 70.   The combination of platelets and cholesterol over 5-10 years forms plaque that can break off and cause a heart attack or stroke.   PLEASE REMEMBER:  Diabetes is preventable! Up to 89 percent of complications and morbidities among individuals with type 2 diabetes can  be prevented, delayed, or effectively treated and minimized with regular visits to a health professional, appropriate monitoring and medication, and a healthy diet and lifestyle.   Your A1C is a measure of your sugar over the past 3 months and is not affected by what you have eaten over the past few days. Diabetes increases your chances of stroke and heart attack over 300 % and is the leading cause of blindness and kidney failure in the [place]. Please make sure you decrease bad carbs like white bread, white rice, potatoes, corn,  soft drinks, pasta, cereals, refined sugars, sweet tea, dried fruits, and fruit juice. Good carbs are okay to eat in moderation like sweet potatoes, brown rice, whole grain pasta/bread, most fruit (except dried fruit) and you can eat as many veggies as you want.   Greater than 6.5 is considered diabetic. Between 6.4 and 5.7 is prediabetic If your A1C is less than 5.7 you are NOT diabetic.  Targets for Glucose Readings: Time of Check Target for patients WITHOUT Diabetes Target for DIABETICS  Before Meals Less than 100  less than 150  Two hours after meals Less than 200  Less than 250   Recommendations For Diabetic/Prediabetic Patients:   -  Take medications as prescribed  -  Recommend Dr Fara Olden Fuhrman's book "The End of Diabetes "  And "The End of Dieting"- Can get at  [email or web address] and encourage also get the Audio CD book  - AVOID Animal products, ie. Meat - red/white, Poultry and Dairy/especially cheese - Exercise at least 5 times a week for 30 minutes or preferably daily.  - No Smoking - Drink less than 2 drinks a day.  - Monitor your feet for sores - Have yearly Eye Exams - Recommend annual Flu vaccine  - Recommend Pneumovax and Prevnar vaccines - Shingles Vaccine (Zostavax) if over 66 y.o.  Goals:   - BMI less than 24 - Fasting sugar less than 130 or less than 150 if tapering medicines to lose weight  - Systolic BP less than 188  - Diastolic BP less than 80 - Bad LDL Cholesterol less than 70 - Triglycerides less than 150

## 2014-12-06 NOTE — Progress Notes (Signed)
Assessment and Plan:  1. Hypertension -Continue medication, monitor blood pressure at home. Continue DASH diet.  Reminder to go to the ER if any CP, SOB, nausea, dizziness, severe HA, changes vision/speech, left arm numbness and tingling and jaw pain.  2. Cholesterol -Continue diet and exercise. Check cholesterol.   3. Diabetes with diabetic chronic kidney disease -Continue diet and exercise. Check A1C  4. Vitamin D Def - check level and continue medications.   5. Obesity with co morbidities-  long discussion about weight loss, diet, and exercise  Continue diet and meds as discussed. Further disposition pending results of labs. Discussed med's effects and SE's.    Over 30 minutes of exam, counseling, chart review, and critical decision making was performed   HPI 59 y.o. male  presents for 3 month follow up on hypertension, cholesterol, diabetes and vitamin D deficiency.   His blood pressure has not been controlled at home, today his BP is BP: 140/60 mmHg.  He does not workout. He denies chest pain, shortness of breath, dizziness.  He is on cholesterol medication and denies myalgias. His cholesterol is at goal. 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 Last A1C was:  Lab Results  Component Value Date   HGBA1C 7.9* 04/06/2014   Lab Results  Component Value Date   GFRNONAA 79 04/06/2014    Patient is on Vitamin D supplement. Lab Results  Component Value Date   VD25OH 46 04/06/2014   BMI is Body mass  index is 33.88 kg/(m^2)., he is working on diet and exercise. Wt Readings from Last 3 Encounters:  12/06/14 264 lb (119.75 kg)  05/22/14 260 lb (117.935 kg)  05/08/14 261 lb (118.389 kg)    Current Medications:  Current Outpatient Prescriptions on File Prior to Visit  Medication Sig Dispense Refill  . aspirin 81 MG tablet Take 81 mg by mouth daily.    Marland Kitchen atorvastatin (LIPITOR) 40 MG tablet TAKE 1 TABLET DAILY FOR CHOLESTEROL 90 tablet 0  . bisoprolol-hydrochlorothiazide (ZIAC) 5-6.25 MG per tablet TAKE 1 TABLET DAILY FOR BLOOD PRESSURE 90 tablet 0  . CIALIS 20 MG tablet TAKE ONE-HALF (1/2) TABLET TO ONE TABLET EVERY 2 TO 3 DAYS AS NEEDED. 30 tablet 99  . HUMALOG 100 UNIT/ML injection USE AS DIRECTED 10 mL 1  . hyoscyamine (LEVSIN/SL) 0.125 MG SL tablet Place 1 tablet (0.125 mg total) under the tongue every 4 (four) hours as needed. 50 tablet 0  . Insulin Syringe-Needle U-100 (BD INSULIN SYRINGE ULTRAFINE) 31G X 5/16" 0.5 ML MISC USE TWICE A DAY AS DIRECTED 200 each PRN  . LANTUS 100 UNIT/ML injection USE AS DIRECTED 10 mL 1  . losartan-hydrochlorothiazide (HYZAAR) 100-12.5 MG per tablet TAKE 1 TABLET DAILY 90 tablet 0  . losartan-hydrochlorothiazide (HYZAAR) 100-25 MG per tablet     . metFORMIN (GLUCOPHAGE) 500 MG tablet Take 500 mg by mouth 3 (three) times daily.    Marland Kitchen omeprazole (PRILOSEC) 20 MG capsule TAKE  1 CAPSULE DAILY FOR ACID REFLUX 90 capsule 0  . ONE TOUCH ULTRA TEST test strip USE TO TAKE GLUCOSE THREE TO FOUR TIMES DAILY FOR FLUCTUATING BLOOD SUGARS 400 each 3  . potassium chloride SA (K-DUR,KLOR-CON) 20 MEQ tablet TAKE 1 TABLET TWICE A DAY 180 tablet 1  . promethazine (PHENERGAN) 25 MG tablet Take 1 tablet (25 mg total) by mouth every 6 (six) hours as needed for nausea or vomiting. Max: 4 tablets per day 30 tablet 0   No current facility-administered medications on file prior to visit.   Medical History:  Past Medical History  Diagnosis Date  . Diabetes mellitus without  complication ([place]) 3790    insulin requiring  . Hyperlipidemia   . Hypertension   . OSA on CPAP     10-12 years ago  . Vitamin D deficiency   . GERD (gastroesophageal reflux disease)   . Hypokalemia   . 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 Hypomagnesemia    Allergies:  Allergies  Allergen Reactions  . Crestor [Rosuvastatin]      Review of Systems:  Review of Systems  Constitutional: Negative.   HENT: Negative.   Eyes: Negative.   Respiratory: Positive for shortness of breath (with exertion). Negative for cough, hemoptysis, sputum production and wheezing.   Cardiovascular: Negative.   Gastrointestinal: Negative.  Heartburn: better with PPI.  Genitourinary: Negative.   Musculoskeletal: Negative.   Skin: Negative.   Neurological: Negative.   Endo/Heme/Allergies: Negative.   Psychiatric/Behavioral: Negative.     Family history- Review and unchanged Social history- Review and unchanged Physical Exam: BP 140/60 mmHg  Pulse 65  Temp(Src) 97.7 F (36.5 C) (Temporal)  Resp 14  Ht 6\' 2"  (1.88 m)  Wt 264 lb (119.75 kg)  BMI 33.88 kg/m2  SpO2 96% Wt Readings from Last 3 Encounters:  12/06/14 264 lb (119.75 kg)  05/22/14 260 lb (117.935 kg)  05/08/14 261 lb (118.389 kg)   General Appearance: Well nourished, in no apparent distress. Eyes: PERRLA, EOMs, conjunctiva no swelling or erythema Sinuses: No Frontal/maxillary tenderness ENT/Mouth: Ext aud canals clear, TMs without erythema, bulging. No erythema, swelling, or exudate on post pharynx.  Tonsils not swollen or erythematous. Hearing normal.  Neck: Supple, thyroid normal.  Respiratory: Respiratory effort normal, BS equal bilaterally without rales, rhonchi, wheezing or stridor.  Cardio: RRR with no MRGs. Brisk peripheral pulses without edema.  Abdomen: Soft, + BS.  Non tender, no guarding, rebound, hernias, masses. Lymphatics: Non tender without lymphadenopathy.   Musculoskeletal: Full ROM, 5/5 strength, Normal gait Skin: Warm, dry without rashes, lesions, ecchymosis.  Neuro: Cranial nerves intact. No cerebellar symptoms.  Psych: Awake and oriented X 3, normal affect, Insight and Judgment appropriate.    Vicie Mutters, PA-C 2:45 PM [facility]

## 2014-12-07 LAB — HEMOGLOBIN A1C
Hgb A1c MFr Bld: 8 % — ABNORMAL HIGH (ref <5.7)
MEAN PLASMA GLUCOSE: 183 mg/dL — AB (ref <117)

## 2014-12-07 LAB — VITAMIN D 25 HYDROXY (VIT D DEFICIENCY, FRACTURES): Vit D, 25-Hydroxy: 43 ng/mL (ref 30–100)

## 2014-12-11 ENCOUNTER — Other Ambulatory Visit: Payer: Self-pay | Admitting: Internal Medicine

## 2014-12-16 ENCOUNTER — Other Ambulatory Visit: Payer: Self-pay | Admitting: Internal Medicine

## 2014-12-31 ENCOUNTER — Other Ambulatory Visit: Payer: Self-pay | Admitting: Internal Medicine

## 2015-01-05 ENCOUNTER — Other Ambulatory Visit: Payer: Self-pay | Admitting: Internal Medicine

## 2015-01-08 ENCOUNTER — Other Ambulatory Visit: Payer: Self-pay | Admitting: Internal Medicine

## 2015-01-23 ENCOUNTER — Other Ambulatory Visit: Payer: Self-pay | Admitting: Internal Medicine

## 2015-04-10 ENCOUNTER — Encounter: Payer: Self-pay | Admitting: Physician Assistant

## 2015-04-10 ENCOUNTER — Other Ambulatory Visit: Payer: Self-pay | Admitting: Internal Medicine

## 2015-04-18 ENCOUNTER — Encounter: Payer: Self-pay | Admitting: Physician Assistant

## 2015-04-18 ENCOUNTER — Ambulatory Visit (INDEPENDENT_AMBULATORY_CARE_PROVIDER_SITE_OTHER): Payer: 59 | Admitting: Physician Assistant

## 2015-04-18 VITALS — BP 114/80 | HR 60 | Temp 97.9°F | Resp 16 | Ht 74.5 in | Wt 263.0 lb

## 2015-04-18 DIAGNOSIS — Z125 Encounter for screening for malignant neoplasm of prostate: Secondary | ICD-10-CM

## 2015-04-18 DIAGNOSIS — E785 Hyperlipidemia, unspecified: Secondary | ICD-10-CM

## 2015-04-18 DIAGNOSIS — E559 Vitamin D deficiency, unspecified: Secondary | ICD-10-CM | POA: Diagnosis not present

## 2015-04-18 DIAGNOSIS — K439 Ventral hernia without obstruction or gangrene: Secondary | ICD-10-CM | POA: Diagnosis not present

## 2015-04-18 DIAGNOSIS — N400 Enlarged prostate without lower urinary tract symptoms: Secondary | ICD-10-CM

## 2015-04-18 DIAGNOSIS — I712 Thoracic aortic aneurysm, without rupture: Secondary | ICD-10-CM | POA: Diagnosis not present

## 2015-04-18 DIAGNOSIS — K2100 Gastro-esophageal reflux disease with esophagitis, without bleeding: Secondary | ICD-10-CM

## 2015-04-18 DIAGNOSIS — N521 Erectile dysfunction due to diseases classified elsewhere: Secondary | ICD-10-CM

## 2015-04-18 DIAGNOSIS — Z79899 Other long term (current) drug therapy: Secondary | ICD-10-CM | POA: Diagnosis not present

## 2015-04-18 DIAGNOSIS — E1122 Type 2 diabetes mellitus with diabetic chronic kidney disease: Secondary | ICD-10-CM

## 2015-04-18 DIAGNOSIS — E1169 Type 2 diabetes mellitus with other specified complication: Secondary | ICD-10-CM

## 2015-04-18 DIAGNOSIS — I493 Ventricular premature depolarization: Secondary | ICD-10-CM

## 2015-04-18 DIAGNOSIS — R6889 Other general symptoms and signs: Secondary | ICD-10-CM

## 2015-04-18 DIAGNOSIS — N182 Chronic kidney disease, stage 2 (mild): Secondary | ICD-10-CM | POA: Diagnosis not present

## 2015-04-18 DIAGNOSIS — E876 Hypokalemia: Secondary | ICD-10-CM

## 2015-04-18 DIAGNOSIS — E662 Morbid (severe) obesity with alveolar hypoventilation: Secondary | ICD-10-CM | POA: Diagnosis not present

## 2015-04-18 DIAGNOSIS — E669 Obesity, unspecified: Secondary | ICD-10-CM | POA: Diagnosis not present

## 2015-04-18 DIAGNOSIS — Z0001 Encounter for general adult medical examination with abnormal findings: Secondary | ICD-10-CM

## 2015-04-18 DIAGNOSIS — I7121 Aneurysm of the ascending aorta, without rupture: Secondary | ICD-10-CM

## 2015-04-18 DIAGNOSIS — I10 Essential (primary) hypertension: Secondary | ICD-10-CM

## 2015-04-18 LAB — URINALYSIS, ROUTINE W REFLEX MICROSCOPIC
Bilirubin Urine: NEGATIVE
HGB URINE DIPSTICK: NEGATIVE
KETONES UR: NEGATIVE
LEUKOCYTES UA: NEGATIVE
Nitrite: NEGATIVE
PROTEIN: NEGATIVE
SPECIFIC GRAVITY, URINE: 1.023 (ref 1.001–1.035)
pH: 7.5 (ref 5.0–8.0)

## 2015-04-18 LAB — HEPATIC FUNCTION PANEL
ALT: 19 U/L (ref 9–46)
AST: 17 U/L (ref 10–35)
Albumin: 3.8 g/dL (ref 3.6–5.1)
Alkaline Phosphatase: 87 U/L (ref 40–115)
Bilirubin, Direct: 0.2 mg/dL (ref <=0.2)
Indirect Bilirubin: 0.9 mg/dL (ref 0.2–1.2)
TOTAL PROTEIN: 6.7 g/dL (ref 6.1–8.1)
Total Bilirubin: 1.1 mg/dL (ref 0.2–1.2)

## 2015-04-18 LAB — BASIC METABOLIC PANEL WITH GFR
BUN: 17 mg/dL (ref 7–25)
CALCIUM: 9.6 mg/dL (ref 8.6–10.3)
CHLORIDE: 100 mmol/L (ref 98–110)
CO2: 28 mmol/L (ref 20–31)
CREATININE: 0.97 mg/dL (ref 0.70–1.25)
GFR, Est African American: >89 mL/min (ref >=60)
GFR, Est Non African American: 84 mL/min (ref >=60)
GLUCOSE: 71 mg/dL (ref 65–99)
Potassium: 3.5 mmol/L (ref 3.5–5.3)
Sodium: 138 mmol/L (ref 135–146)

## 2015-04-18 LAB — CBC WITH DIFFERENTIAL/PLATELET
BASOS PCT: 2 % — AB (ref 0–1)
Basophils Absolute: 0.1 10*3/uL (ref 0.0–0.1)
Eosinophils Absolute: 0.1 10*3/uL (ref 0.0–0.7)
Eosinophils Relative: 3 % (ref 0–5)
HEMATOCRIT: 47.2 % (ref 39.0–52.0)
HEMOGLOBIN: 15.7 g/dL (ref 13.0–17.0)
LYMPHS ABS: 1.2 10*3/uL (ref 0.7–4.0)
LYMPHS PCT: 24 % (ref 12–46)
MCH: 27.7 pg (ref 26.0–34.0)
MCHC: 33.3 g/dL (ref 30.0–36.0)
MCV: 83.4 fL (ref 78.0–100.0)
MONO ABS: 0.4 10*3/uL (ref 0.1–1.0)
MONOS PCT: 8 % (ref 3–12)
MPV: 10.4 fL (ref 8.6–12.4)
NEUTROS ABS: 3 10*3/uL (ref 1.7–7.7)
Neutrophils Relative %: 63 % (ref 43–77)
Platelets: 203 10*3/uL (ref 150–400)
RBC: 5.66 MIL/uL (ref 4.22–5.81)
RDW: 14.2 % (ref 11.5–15.5)
WBC: 4.8 10*3/uL (ref 4.0–10.5)

## 2015-04-18 LAB — LIPID PANEL
CHOLESTEROL: 162 mg/dL (ref 125–200)
HDL: 49 mg/dL (ref >=40)
LDL Cholesterol: 97 mg/dL (ref <130)
Total CHOL/HDL Ratio: 3.3 Ratio (ref <=5.0)
Triglycerides: 81 mg/dL (ref <150)
VLDL: 16 mg/dL (ref <30)

## 2015-04-18 LAB — URIC ACID: URIC ACID, SERUM: 2.9 mg/dL — AB (ref 4.0–7.8)

## 2015-04-18 LAB — MAGNESIUM: MAGNESIUM: 2 mg/dL (ref 1.5–2.5)

## 2015-04-18 LAB — HEMOGLOBIN A1C
HEMOGLOBIN A1C: 8.1 % — AB (ref <5.7)
MEAN PLASMA GLUCOSE: 186 mg/dL — AB (ref <117)

## 2015-04-18 LAB — TSH: TSH: 1.63 m[IU]/L (ref 0.40–4.50)

## 2015-04-18 MED ORDER — TADALAFIL 20 MG PO TABS: 20.0000 mg | ORAL_TABLET | Freq: Every day | ORAL | Status: DC | PRN

## 2015-04-18 NOTE — Patient Instructions (Addendum)
Check with your insurance about which GLP it will cover Examples are Victoza once a day, has more weight loss with it.  Bydrueon, tanzeum, and trulicity are once a week injections.   Diabetes is a very complicated disease...lets simplify it.  An easy way to look at it to understand the complications is if you think of the extra sugar floating in your blood stream as glass shards floating through your blood stream.    Diabetes affects your small vessels first: 1) The glass shards (sugar) scraps down the tiny blood vessels in your eyes and lead to diabetic retinopathy, the leading cause of blindness in the [place]. Diabetes is the leading cause of newly diagnosed adult (20 to 61 years of age) blindness in the [place].  2) The glass shards scratches down the tiny vessels of your legs leading to nerve damage called neuropathy and can lead to amputations of your feet. More than 60% of all non-traumatic amputations of lower limbs occur in people with diabetes.  3) Over time the small vessels in your brain are shredded and closed off, individually this does not cause any problems but over a long period of time many of the small vessels being blocked can lead to Vascular Dementia.   4) Your kidney's are a filter system and have a "net" that keeps certain things in the body and lets bad things out. Sugar shreds this net and leads to kidney damage and eventually failure. Decreasing the sugar that is destroying the net and certain blood pressure medications can help stop or decrease progression of kidney disease. Diabetes was the primary cause of kidney failure in 44 percent of all new cases in 2011.  5) Diabetes also destroys the small vessels in your penis that lead to erectile dysfunction. Eventually the vessels are so damaged that you may not be responsive to cialis or viagra.   Diabetes and your large vessels: Your larger vessels consist of your coronary arteries in your heart and the carotid vessels  to your brain. Diabetes or even increased sugars put you at 300% increased risk of heart attack and stroke and this is why.. The sugar scrapes down your large blood vessels and your body sees this as an internal injury and tries to repair itself. Just like you get a scab on your skin, your platelets will stick to the blood vessel wall trying to heal it. This is why we have diabetics on low dose aspirin daily, this prevents the platelets from sticking and can prevent plaque formation. In addition, your body takes cholesterol and tries to shove it into the open wound. This is why we want your LDL, or bad cholesterol, below 70.   The combination of platelets and cholesterol over 5-10 years forms plaque that can break off and cause a heart attack or stroke.   PLEASE REMEMBER:  Diabetes is preventable! Up to 43 percent of complications and morbidities among individuals with type 2 diabetes can be prevented, delayed, or effectively treated and minimized with regular visits to a health professional, appropriate monitoring and medication, and a healthy diet and lifestyle.     Bad carbs also include fruit juice, alcohol, and sweet tea. These are empty calories that do not signal to your brain that you are full.   Please remember the good carbs are still carbs which convert into sugar. So please measure them out no more than 1/2-1 cup of rice, oatmeal, pasta, and beans  Veggies are however free foods! Pile them  on.   Not all fruit is created equal. Please see the list below, the fruit at the bottom is higher in sugars than the fruit at the top. Please avoid all dried fruits.

## 2015-04-18 NOTE — Progress Notes (Signed)
Complete Physical  Assessment and Plan: 1. Essential hypertension - continue medications, DASH diet, exercise and monitor at home. Call if greater than 130/80.  - CBC with Differential/Platelet - Hepatic function panel - TSH - Urinalysis, Routine w reflex microscopic - Microalbumin / creatinine urine ratio  2. 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 - Hemoglobin A1c - Insulin, fasting - HM DIABETES FOOT EXAM  3. CKD (chronic kidney disease) stage 2, GFR 60-89 ml/min Increase fluids, avoid NSAIDS, monitor sugars, will monitor - BASIC METABOLIC PANEL WITH GFR  4. Obesity (BMI 33) Obesity with co morbidities- long discussion about weight loss, diet, and exercise - Testosterone  5. Hyperlipidemia -continue medications, check lipids, decrease fatty foods, increase activity.  - Lipid panel  6. Vitamin D deficiency - Vit D  25 hydroxy (rtn osteoporosis monitoring)  7.Prostate cancer screening - PSA  8. Gastroesophageal reflux disease with esophagitis Continue PPI/H2 blocker, diet discussed  9. OSA on CPAP Sleep apnea- continue CPAP, weight loss advised.   10. Ventral hernia without obstruction or gangrene monitor  11. BPH (benign prostatic hypertrophy) cialis 20 mg prescribed, normal DRE, check urine  12. Erectile dysfunction associated with type 2 diabetes mellitus Cialis PRN  13. Hypokalemia Check potassium - Magnesium  14. Ascending aorta aneurysm Control blood pressure, cholesterol, glucose, increase exercise.  Continue cardio follow up  Discussed med's effects and SE's. Screening labs and tests as requested with regular follow-up as recommended.  HPI Patient presents for a complete physical.   His blood pressure has not been checked  at home, today their BP is BP: 114/80 mmHg He does not workout. He denies chest pain, shortness of breath, dizziness, shortness breath.  Had normal stress test and echo with Dr. 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   He is on cholesterol medication, lipitor 40mg  and denies myalgias. His cholesterol is at goal. The cholesterol last visit was:   Lab Results  Component Value Date   CHOL 153 12/06/2014   HDL 50 12/06/2014   LDLCALC 72 12/06/2014   TRIG 154* 12/06/2014   CHOLHDL 3.1 12/06/2014   He has been working on diet and exercise for insulin dependent diabetes with CKD stage 2, he has had DM x 30 years, he is on bASA, he is on ACE/ARB, he is checking his sugars at night, he is lantus and humulog TID but admits to forgetting lunch time dose, and denies paresthesia of the feet, polydipsia, polyuria and visual disturbances. Last A1C in the office was:  Lab Results  Component Value Date   HGBA1C 8.0* 12/06/2014  Patient is on Vitamin D supplement.   Lab Results  Component Value Date   VD25OH 42 12/06/2014    Last PSA was: Lab Results  Component Value Date   PSA 2.81 04/06/2014  He has BPH, but is not on anything for this but states the last few months it has gotten worse.   He has ED,  and he is on cialis PRN 20mg .  He has GERD, is on prilosec.  Has history of hypokalemia, he is on Kdur 20 BID. Lab Results  Component Value Date   CREATININE 1.20 12/06/2014   BUN 17 12/06/2014   NA 138 12/06/2014   K 3.6 12/06/2014   CL 99 12/06/2014   CO2 30 12/06/2014   BMI  is Body mass index is 33.33 kg/(m^2)., he is working on diet and exercise. Due to his obesity he has OSA and is on CPAP.  Wt Readings from Last 3 Encounters:  04/18/15 263 lb (119.296 kg)  12/06/14 264 lb (119.75 kg)  05/22/14 260 lb (117.935 kg)     Current Medications:  Current Outpatient Prescriptions on File Prior to Visit  Medication Sig Dispense Refill  . aspirin 81 MG tablet Take  81 mg by mouth daily.    Marland Kitchen atorvastatin (LIPITOR) 40 MG tablet TAKE 1 TABLET DAILY FOR CHOLESTEROL 90 tablet 1  . bisoprolol-hydrochlorothiazide (ZIAC) 5-6.25 MG tablet TAKE 1 TABLET DAILY FOR BLOOD PRESSURE (NEED OFFICE VISIT BEFORE ANY ADDITIONAL REFILLS AUTHORIZED) 90 tablet 1  . CIALIS 20 MG tablet TAKE ONE-HALF (1/2) TABLET TO ONE TABLET EVERY 2 TO 3 DAYS AS NEEDED. 30 tablet 99  . HUMALOG 100 UNIT/ML injection USE AS DIRECTED 10 mL 99  . Insulin Syringe-Needle U-100 (BD INSULIN SYRINGE ULTRAFINE) 31G X 5/16" 0.5 ML MISC USE TWICE A DAY AS DIRECTED 200 each PRN  . LANTUS 100 UNIT/ML injection USE AS DIRECTED 10 mL 1  . losartan-hydrochlorothiazide (HYZAAR) 100-12.5 MG tablet TAKE 1 TABLET DAILY 90 tablet 1  . losartan-hydrochlorothiazide (HYZAAR) 100-25 MG per tablet     . metFORMIN (GLUCOPHAGE) 500 MG tablet Take 500 mg by mouth 3 (three) times daily.    Marland Kitchen omeprazole (PRILOSEC) 20 MG capsule TAKE 1 CAPSULE DAILY FOR ACID REFLUX 90 capsule 1  . ONE TOUCH ULTRA TEST test strip USE TO TAKE GLUCOSE THREE TO FOUR TIMES DAILY FOR FLUCTUATING BLOOD SUGARS 400 each 3  . potassium chloride SA (K-DUR,KLOR-CON) 20 MEQ tablet TAKE 1 TABLET TWICE A DAY 180 tablet 1   No current facility-administered medications on file prior to visit.   Health Maintenance:  Immunization History  Administered Date(s) Administered  . Influenza Split 11/28/2013, 12/06/2014  . Influenza-Unspecified 12/19/2011, 01/04/2013  . Pneumococcal Polysaccharide-23 03/03/2006  . Td 03/03/2008  . Zoster 02/07/2011   Tetanus: 2010 Pneumovax: 2008 Prevnar 13: N/A Flu vaccine: 12/2014 Zostavax: 2012 DEXA: N/A Colonoscopy: 12/2013 (Dr. Cristina Gong) EGD: N/A Eye Exam: Dr. Gershon Crane q yearly Dentist: Dr. Ronita Hipps, [place]  Patient Care Team: Unk Pinto, MD as PCP - General (Internal Medicine) Rutherford Guys, MD as Consulting Physician (Ophthalmology) Ronald Lobo, MD as Consulting Physician (Gastroenterology) Larey Dresser, MD as Consulting Physician (Cardiology)  Allergies:  Allergies  Allergen Reactions  . Crestor [Rosuvastatin]    Medical History:  Past Medical History  Diagnosis Date  . Diabetes mellitus without complication ([place]) [id number]    insulin requiring  . Hyperlipidemia   . Hypertension   . OSA on CPAP     10-12 years ago  . Vitamin D deficiency   . GERD (gastroesophageal reflux disease)   . Hypokalemia   . 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 Hypomagnesemia    Surgical History:  Past Surgical History  Procedure Laterality Date  . Anal fissure repair  2007  . Ventral hernia repair  04/13/12    Dr. Johney Maine, ventral hernia repair w/ mesh   Family History:  Family History  Problem Relation Age of Onset  . Cancer Father     kidney   Social History:   Social History  Substance Use Topics  . Smoking status: Never Smoker   . Smokeless tobacco: None  . Alcohol Use: 0.0 oz/week    2-5  Cans of beer per week     Comment: weekly   Review of Systems:  Review of Systems  Constitutional: Negative.   HENT: Negative.   Eyes: Negative.   Respiratory: Positive for shortness of breath (with exertion). Negative for cough, hemoptysis, sputum production and wheezing.   Cardiovascular: Negative.   Gastrointestinal: Negative.  Heartburn: better with PPI.  Genitourinary: Negative.   Musculoskeletal: Negative.   Skin: Negative.   Neurological: Negative.   Endo/Heme/Allergies: Negative.   Psychiatric/Behavioral: Negative.     Physical Exam: Estimated body mass index is 33.33 kg/(m^2) as calculated from the following:   Height as of this encounter: 6' 2.5" (1.892 m).   Weight as of this encounter: 263 lb (119.296 kg). BP 114/80 mmHg  Pulse 60  Temp(Src) 97.9 F (36.6 C)  Resp 16  Ht 6' 2.5" (1.892 m)  Wt 263 lb (119.296 kg)  BMI 33.33 kg/m2  SpO2 95% General Appearance: Well nourished, in no apparent distress.  Eyes: PERRLA, EOMs,  conjunctiva no swelling or erythema, normal fundi and vessels.  Sinuses: No Frontal/maxillary tenderness  ENT/Mouth: Ext aud canals clear, normal light reflex with TMs without erythema, bulging. Good dentition. No erythema, swelling, or exudate on post pharynx. Tonsils not swollen or erythematous. Hearing normal.  Neck: Supple, thyroid normal. No bruits  Respiratory: Respiratory effort normal, BS equal bilaterally without rales, rhonchi, wheezing or stridor.  Cardio: RRR without murmurs, rubs or gallops, decreased HS due to body habitus. Brisk peripheral pulses without edema.  Chest: symmetric, with normal excursions and percussion.  Abdomen: Soft, nontender, obese, + ventral hernia, no guarding, rebound, masses, or organomegaly. .  Lymphatics: Non tender without lymphadenopathy.  Genitourinary: prostate smooth, no masses, nontender, neg hemoccult Musculoskeletal: Full ROM all peripheral extremities,5/5 strength, and normal gait.  Skin: Warm, dry without rashes, lesions, ecchymosis. Neuro: Cranial nerves intact, reflexes equal bilaterally. Normal muscle tone, no cerebellar symptoms. Sensation intact.  Psych: Awake and oriented X 3, normal affect, Insight and Judgment appropriate.   EKG: defer cardio AORTA SCAN: defer cardio  Vicie Mutters 9:35 AM [facility]

## 2015-04-19 LAB — PSA: PSA: 2.64 ng/mL (ref <=4.00)

## 2015-04-19 LAB — TESTOSTERONE: Testosterone: 397 ng/dL (ref 250–827)

## 2015-04-19 LAB — VITAMIN D 25 HYDROXY (VIT D DEFICIENCY, FRACTURES): Vit D, 25-Hydroxy: 44 ng/mL (ref 30–100)

## 2015-04-19 LAB — MICROALBUMIN / CREATININE URINE RATIO
CREATININE, URINE: 142 mg/dL (ref 20–370)
MICROALB UR: 0.4 mg/dL
MICROALB/CREAT RATIO: 3 ug/mg{creat} (ref <30)

## 2015-04-25 ENCOUNTER — Telehealth: Payer: Self-pay | Admitting: Physician Assistant

## 2015-04-25 MED ORDER — HYOSCYAMINE SULFATE 0.125 MG PO TABS: 0.1250 mg | ORAL_TABLET | ORAL | Status: DC | PRN

## 2015-04-25 MED ORDER — LEVOFLOXACIN 500 MG PO TABS
500.0000 mg | ORAL_TABLET | Freq: Every day | ORAL | Status: DC
Start: 2015-04-25 — End: 2015-05-30

## 2015-04-25 MED ORDER — PREDNISONE 20 MG PO TABS: ORAL_TABLET | ORAL | Status: DC

## 2015-04-25 NOTE — Telephone Encounter (Signed)
Patient calling and complaining about diarrhea x 1 week, normal colonoscopy 2015, has had flu symptoms with fever, cough, mucus x 1 week.  Will send in prednisone, levaquin as well as levsin, needs follow up appointment in the office if not getting better.  Needs to go to ER if any AB pain, worsening SOB, can't keep food down, dizziness, CP.

## 2015-04-25 NOTE — Telephone Encounter (Signed)
Spoke with pt about about Rx that he could pick up & pt voiced understanding & stated that if he was not getting any better he would come in for an OV.

## 2015-05-30 ENCOUNTER — Ambulatory Visit (INDEPENDENT_AMBULATORY_CARE_PROVIDER_SITE_OTHER): Payer: 59 | Admitting: Internal Medicine

## 2015-05-30 ENCOUNTER — Encounter: Payer: Self-pay | Admitting: Internal Medicine

## 2015-05-30 VITALS — BP 132/64 | HR 72 | Temp 98.2°F | Resp 18 | Ht 74.5 in | Wt 262.0 lb

## 2015-05-30 DIAGNOSIS — J069 Acute upper respiratory infection, unspecified: Secondary | ICD-10-CM

## 2015-05-30 MED ORDER — AZITHROMYCIN 250 MG PO TABS: ORAL_TABLET | ORAL | Status: DC

## 2015-05-30 MED ORDER — PROMETHAZINE-DM 6.25-15 MG/5ML PO SYRP: ORAL_SOLUTION | ORAL | Status: DC

## 2015-05-30 MED ORDER — PSEUDOEPH-BROMPHEN-DM 30-2-10 MG/5ML PO SYRP: 5.0000 mL | ORAL_SOLUTION | Freq: Three times a day (TID) | ORAL | Status: DC | PRN

## 2015-05-30 MED ORDER — PREDNISONE 20 MG PO TABS: ORAL_TABLET | ORAL | Status: DC

## 2015-05-30 MED ORDER — FLUTICASONE PROPIONATE 50 MCG/ACT NA SUSP: 2.0000 | Freq: Every day | NASAL | Status: DC

## 2015-05-30 NOTE — Progress Notes (Signed)
HPI  Patient presents to the office for evaluation of cough.  It has been going on for 1 weeks.  Patient reports night > day, dry, worse with lying down.  They also endorse change in voice, chills, night sweats, postnasal drip, sputum production and nasal congestion, sneezing, itchy watery eyes.  .  They have tried airborne.  They report that nothing has worked.  They admits to other sick contacts.  Review of Systems  Constitutional: Positive for fever, chills and malaise/fatigue.  HENT: Positive for congestion and sore throat. Negative for ear pain.   Respiratory: Positive for cough and sputum production. Negative for shortness of breath and wheezing.   Cardiovascular: Negative for chest pain, palpitations, orthopnea and leg swelling.  Neurological: Negative for headaches.    PE:  Filed Vitals:   05/30/15 1507  BP: 132/64  Pulse: 72  Temp: 98.2 F (36.8 C)  Resp: 18    General:  Alert and non-toxic, WDWN, NAD HEENT: NCAT, PERLA, EOM normal, no occular discharge or erythema.  Nasal mucosal edema with sinus tenderness to palpation.  Oropharynx clear with minimal oropharyngeal edema and erythema.  Mucous membranes moist and pink. Neck:  Cervical adenopathy Chest:  RRR no MRGs.  Lungs clear to auscultation A&P with no wheezes rhonchi or rales.   Abdomen: +BS x 4 quadrants, soft, non-tender, no guarding, rigidity, or rebound. Skin: warm and dry no rash Neuro: A&Ox4, CN II-XII grossly intact  Assessment and Plan:   1. Acute URI  - predniSONE (DELTASONE) 20 MG tablet; 3 tabs po day one, then 2 tabs daily x 4 days  Dispense: 11 tablet; Refill: 0 - fluticasone (FLONASE) 50 MCG/ACT nasal spray; Place 2 sprays into both nostrils daily.  Dispense: 16 g; Refill: 0 - promethazine-dextromethorphan (PROMETHAZINE-DM) 6.25-15 MG/5ML syrup; Take 5-10 mL PO q8hrs prn for cough symptoms  Dispense: 360 mL; Refill: 1 - azithromycin (ZITHROMAX Z-PAK) 250 MG tablet; 2 po day one, then 1 daily x 4 days   Dispense: 6 tablet; Refill: 0

## 2015-05-30 NOTE — Patient Instructions (Signed)
Please use saline in your nose as often as you can tolerate.  Please use flonase 2 sprays per nostril right before you go to bed.  Please take claritin, allegra, or zyrtec daily until this resolves.  Please take prednisone until it is gone.  Use cough syrup up to three times daily as needed for cough and congestion.  Please hold on to zpak.  Take only if you have fevers greater than 100.5, colored mucous from your nose or your cough, or any other concerning symptoms start.

## 2015-06-21 ENCOUNTER — Other Ambulatory Visit: Payer: Self-pay | Admitting: Internal Medicine

## 2015-07-16 ENCOUNTER — Other Ambulatory Visit: Payer: Self-pay | Admitting: Internal Medicine

## 2015-07-23 ENCOUNTER — Other Ambulatory Visit: Payer: Self-pay | Admitting: Internal Medicine

## 2015-08-08 ENCOUNTER — Encounter: Payer: Self-pay | Admitting: Physician Assistant

## 2015-08-08 ENCOUNTER — Ambulatory Visit (INDEPENDENT_AMBULATORY_CARE_PROVIDER_SITE_OTHER): Payer: 59 | Admitting: Physician Assistant

## 2015-08-08 VITALS — BP 120/70 | HR 58 | Temp 97.3°F | Resp 16 | Ht 74.5 in | Wt 264.0 lb

## 2015-08-08 DIAGNOSIS — N182 Chronic kidney disease, stage 2 (mild): Secondary | ICD-10-CM | POA: Diagnosis not present

## 2015-08-08 DIAGNOSIS — I712 Thoracic aortic aneurysm, without rupture: Secondary | ICD-10-CM

## 2015-08-08 DIAGNOSIS — E559 Vitamin D deficiency, unspecified: Secondary | ICD-10-CM

## 2015-08-08 DIAGNOSIS — I7121 Aneurysm of the ascending aorta, without rupture: Secondary | ICD-10-CM

## 2015-08-08 DIAGNOSIS — E1122 Type 2 diabetes mellitus with diabetic chronic kidney disease: Secondary | ICD-10-CM | POA: Diagnosis not present

## 2015-08-08 DIAGNOSIS — I10 Essential (primary) hypertension: Secondary | ICD-10-CM | POA: Diagnosis not present

## 2015-08-08 DIAGNOSIS — E669 Obesity, unspecified: Secondary | ICD-10-CM

## 2015-08-08 DIAGNOSIS — Z79899 Other long term (current) drug therapy: Secondary | ICD-10-CM | POA: Diagnosis not present

## 2015-08-08 DIAGNOSIS — E785 Hyperlipidemia, unspecified: Secondary | ICD-10-CM

## 2015-08-08 LAB — HEPATIC FUNCTION PANEL
ALT: 17 U/L (ref 9–46)
AST: 17 U/L (ref 10–35)
Albumin: 4 g/dL (ref 3.6–5.1)
Alkaline Phosphatase: 70 U/L (ref 40–115)
BILIRUBIN DIRECT: 0.2 mg/dL (ref <=0.2)
BILIRUBIN INDIRECT: 0.8 mg/dL (ref 0.2–1.2)
Total Bilirubin: 1 mg/dL (ref 0.2–1.2)
Total Protein: 6.4 g/dL (ref 6.1–8.1)

## 2015-08-08 LAB — LIPID PANEL
CHOLESTEROL: 139 mg/dL (ref 125–200)
HDL: 57 mg/dL (ref >=40)
LDL Cholesterol: 68 mg/dL (ref <130)
TRIGLYCERIDES: 69 mg/dL (ref <150)
Total CHOL/HDL Ratio: 2.4 Ratio (ref <=5.0)
VLDL: 14 mg/dL (ref <30)

## 2015-08-08 LAB — CBC WITH DIFFERENTIAL/PLATELET
BASOS PCT: 2 %
Basophils Absolute: 102 cells/uL (ref 0–200)
EOS PCT: 2 %
Eosinophils Absolute: 102 cells/uL (ref 15–500)
HCT: 46.2 % (ref 38.5–50.0)
HEMOGLOBIN: 15.7 g/dL (ref 13.2–17.1)
LYMPHS ABS: 1326 {cells}/uL (ref 850–3900)
Lymphocytes Relative: 26 %
MCH: 28.2 pg (ref 27.0–33.0)
MCHC: 34 g/dL (ref 32.0–36.0)
MCV: 82.9 fL (ref 80.0–100.0)
MPV: 10.6 fL (ref 7.5–12.5)
Monocytes Absolute: 561 cells/uL (ref 200–950)
Monocytes Relative: 11 %
NEUTROS PCT: 59 %
Neutro Abs: 3009 cells/uL (ref 1500–7800)
Platelets: 197 10*3/uL (ref 140–400)
RBC: 5.57 MIL/uL (ref 4.20–5.80)
RDW: 13.8 % (ref 11.0–15.0)
WBC: 5.1 10*3/uL (ref 3.8–10.8)

## 2015-08-08 LAB — HEMOGLOBIN A1C
HEMOGLOBIN A1C: 7.7 % — AB (ref <5.7)
Mean Plasma Glucose: 174 mg/dL

## 2015-08-08 LAB — BASIC METABOLIC PANEL WITH GFR
BUN: 12 mg/dL (ref 7–25)
CALCIUM: 9.1 mg/dL (ref 8.6–10.3)
CO2: 29 mmol/L (ref 20–31)
Chloride: 100 mmol/L (ref 98–110)
Creat: 1 mg/dL (ref 0.70–1.25)
GFR, EST NON AFRICAN AMERICAN: 81 mL/min (ref >=60)
Glucose, Bld: 44 mg/dL — ABNORMAL LOW (ref 65–99)
POTASSIUM: 3.7 mmol/L (ref 3.5–5.3)
SODIUM: 140 mmol/L (ref 135–146)

## 2015-08-08 LAB — MAGNESIUM: Magnesium: 1.8 mg/dL (ref 1.5–2.5)

## 2015-08-08 LAB — TSH: TSH: 2.57 m[IU]/L (ref 0.40–4.50)

## 2015-08-08 MED ORDER — AZITHROMYCIN 250 MG PO TABS
ORAL_TABLET | ORAL | Status: DC
Start: 2015-08-08 — End: 2015-09-26

## 2015-08-08 MED ORDER — HYOSCYAMINE SULFATE 0.125 MG SL SUBL: 0.1250 mg | SUBLINGUAL_TABLET | SUBLINGUAL | Status: DC | PRN

## 2015-08-08 MED ORDER — PROMETHAZINE HCL 25 MG PO TABS: 25.0000 mg | ORAL_TABLET | Freq: Four times a day (QID) | ORAL | Status: DC | PRN

## 2015-08-08 NOTE — Patient Instructions (Signed)
Diabetes is a very complicated disease...lets simplify it.  An easy way to look at it to understand the complications is if you think of the extra sugar floating in your blood stream as glass shards floating through your blood stream.    Diabetes affects your small vessels first: 1) The glass shards (sugar) scraps down the tiny blood vessels in your eyes and lead to diabetic retinopathy, the leading cause of blindness in the [place]. Diabetes is the leading cause of newly diagnosed adult (20 to 61 years of age) blindness in the [place].  2) The glass shards scratches down the tiny vessels of your legs leading to nerve damage called neuropathy and can lead to amputations of your feet. More than 60% of all non-traumatic amputations of lower limbs occur in people with diabetes.  3) Over time the small vessels in your brain are shredded and closed off, individually this does not cause any problems but over a long period of time many of the small vessels being blocked can lead to Vascular Dementia.   4) Your kidney's are a filter system and have a "net" that keeps certain things in the body and lets bad things out. Sugar shreds this net and leads to kidney damage and eventually failure. Decreasing the sugar that is destroying the net and certain blood pressure medications can help stop or decrease progression of kidney disease. Diabetes was the primary cause of kidney failure in 44 percent of all new cases in 2011.  5) Diabetes also destroys the small vessels in your penis that lead to erectile dysfunction. Eventually the vessels are so damaged that you may not be responsive to cialis or viagra.   Diabetes and your large vessels: Your larger vessels consist of your coronary arteries in your heart and the carotid vessels to your brain. Diabetes or even increased sugars put you at 300% increased risk of heart attack and stroke and this is why.. The sugar scrapes down your large blood vessels and your body  sees this as an internal injury and tries to repair itself. Just like you get a scab on your skin, your platelets will stick to the blood vessel wall trying to heal it. This is why we have diabetics on low dose aspirin daily, this prevents the platelets from sticking and can prevent plaque formation. In addition, your body takes cholesterol and tries to shove it into the open wound. This is why we want your LDL, or bad cholesterol, below 70.   The combination of platelets and cholesterol over 5-10 years forms plaque that can break off and cause a heart attack or stroke.   PLEASE REMEMBER:  Diabetes is preventable! Up to 85 percent of complications and morbidities among individuals with type 2 diabetes can be prevented, delayed, or effectively treated and minimized with regular visits to a health professional, appropriate monitoring and medication, and a healthy diet and lifestyle.     Bad carbs also include fruit juice, alcohol, and sweet tea. These are empty calories that do not signal to your brain that you are full.   Please remember the good carbs are still carbs which convert into sugar. So please measure them out no more than 1/2-1 cup of rice, oatmeal, pasta, and beans  Veggies are however free foods! Pile them on.   Not all fruit is created equal. Please see the list below, the fruit at the bottom is higher in sugars than the fruit at the top. Please avoid all dried fruits.     

## 2015-08-08 NOTE — Progress Notes (Signed)
3 MONTH FOLLOW UP   Assessment and Plan: 1. Essential hypertension - continue medications, DASH diet, exercise and monitor at home. Call if greater than 130/80.  - CBC with Differential/Platelet - Hepatic function panel - TSH - Urinalysis, Routine w reflex microscopic - Microalbumin / creatinine urine ratio  2. 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 - GIVEN SAMPLES OF HUMOLOG PEN TO HELP WITH LUNCH TIME INSULIN - Hemoglobin A1c - Insulin, fasting  3. CKD (chronic kidney disease) stage 2, GFR 60-89 ml/min Increase fluids, avoid NSAIDS, monitor sugars, will monitor - BASIC METABOLIC PANEL WITH GFR  4. Obesity (BMI 33) Obesity with co morbidities- long discussion about weight loss, diet, and exercise - Testosterone  5. Hyperlipidemia -continue medications, check lipids, decrease fatty foods, increase activity.  - Lipid panel  6. Vitamin D deficiency - Vit D  25 hydroxy (rtn osteoporosis monitoring)  7. Ascending aorta aneurysm Control blood pressure, cholesterol, glucose, increase exercise.  Continue cardio follow up  8. Travel medicine Going to [place] for 10 days and would like some medications.   Discussed med's effects and SE's. Screening labs and tests as requested with regular follow-up as recommended.  HPI Patient presents for 3 month follow up for HTN, chol, DM with CKD, AAA.   His blood pressure has not been checked at home, today their BP is BP: 120/70 mmHg He does not workout, but has been building a fence that is strenuous work. He denies chest pain, shortness of breath, dizziness, shortness breath.  Had normal stress test and echo with Dr. 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   He is on cholesterol  medication, lipitor 40mg  and denies myalgias. His cholesterol is at goal. The cholesterol last visit was:   Lab Results  Component Value Date   CHOL 162 04/18/2015   HDL 49 04/18/2015   LDLCALC 97 04/18/2015   TRIG 81 04/18/2015   CHOLHDL 3.3 04/18/2015   He has been working on diet and exercise for insulin dependent diabetes with CKD stage 2, he has had DM x 30 + years, he is on bASA, he is on ACE/ARB, he is checking his sugars at night, he is lantus 35 units AM and 25 PM and humulog 12-20 units TID but admits to forgetting lunch time dose, and denies paresthesia of the feet, polydipsia, polyuria and visual disturbances. Last A1C in the office was:  Lab Results  Component Value Date   HGBA1C 8.1* 04/18/2015  Patient is on Vitamin D supplement.   Lab Results  Component Value Date   VD25OH 44 04/18/2015   BMI is Body mass index is 33.45 kg/(m^2)., he is working on diet and exercise. Due to his obesity he has OSA and is on CPAP.  Wt Readings from Last 3 Encounters:  08/08/15 264 lb (119.75 kg)  05/30/15 262 lb (118.842 kg)  04/18/15 263 lb (119.296 kg)     Current Medications:  Current Outpatient Prescriptions on File Prior to Visit  Medication Sig Dispense Refill  . aspirin 81 MG tablet Take 81 mg by mouth daily.    Marland Kitchen atorvastatin (LIPITOR) 40 MG tablet TAKE 1 TABLET DAILY FOR CHOLESTEROL 90 tablet 0  . BD INSULIN SYRINGE ULTRAFINE 31G X 5/16" 0.5 ML MISC USE TWICE A DAY AS DIRECTED 200 each 49  . bisoprolol-hydrochlorothiazide (ZIAC)  5-6.25 MG tablet TAKE 1 TABLET DAILY FOR BLOOD PRESSURE (NEED OFFICE VISIT BEFORE ANY ADDITIONAL REFILLS AUTHORIZED) 90 tablet 1  . HUMALOG 100 UNIT/ML injection USE AS DIRECTED 10 mL 99  . LANTUS 100 UNIT/ML injection USE AS DIRECTED 10 mL 1  . losartan-hydrochlorothiazide (HYZAAR) 100-12.5 MG tablet TAKE 1 TABLET DAILY 90 tablet 1  . metFORMIN (GLUCOPHAGE) 500 MG tablet Take 500 mg by mouth 3 (three) times daily.    Marland Kitchen omeprazole (PRILOSEC) 20 MG  capsule TAKE 1 CAPSULE DAILY FOR ACID REFLUX 90 capsule 0  . ONE TOUCH ULTRA TEST test strip USE TO TAKE GLUCOSE THREE TO FOUR TIMES DAILY FOR FLUCTUATING BLOOD SUGARS 400 each 3  . potassium chloride SA (K-DUR,KLOR-CON) 20 MEQ tablet TAKE 1 TABLET TWICE A DAY 180 tablet 1  . tadalafil (CIALIS) 20 MG tablet Take 1 tablet (20 mg total) by mouth daily as needed for erectile dysfunction. 60 tablet 3   No current facility-administered medications on file prior to visit.    Allergies:  Allergies  Allergen Reactions  . Crestor [Rosuvastatin]    Medical History:  Past Medical History  Diagnosis Date  . Diabetes mellitus without complication ([place]) A999333    insulin requiring  . Hyperlipidemia   . Hypertension   . OSA on CPAP     10-12 years ago  . Vitamin D deficiency   . GERD (gastroesophageal reflux disease)   . Hypokalemia   . 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 Hypomagnesemia    Surgical History:  Past Surgical History  Procedure Laterality Date  . Anal fissure repair  2007  . Ventral hernia repair  04/13/12    Dr. Johney Maine, ventral hernia repair w/ mesh   Family History:  Family History  Problem Relation Age of Onset  . Cancer Father     kidney   Social History:   Social History  Substance Use Topics  . Smoking status: Never Smoker   . Smokeless tobacco: None  . Alcohol Use: 0.0 oz/week    2-5 Cans of beer per week     Comment: weekly   Review of Systems:  Review of Systems  Constitutional: Negative.   HENT: Negative.   Eyes: Negative.   Respiratory: Positive for shortness of breath (with exertion). Negative for cough, hemoptysis, sputum production and wheezing.   Cardiovascular: Negative.   Gastrointestinal: Negative.  Heartburn: better with PPI.  Genitourinary: Negative.   Musculoskeletal: Negative.   Skin: Negative.   Neurological: Negative.   Endo/Heme/Allergies: Negative.   Psychiatric/Behavioral: Negative.      Physical Exam: Estimated body mass index is 33.45 kg/(m^2) as calculated from the following:   Height as of this encounter: 6' 2.5" (1.892 m).   Weight as of this encounter: 264 lb (119.75 kg). BP 120/70 mmHg  Pulse 58  Temp(Src) 97.3 F (36.3 C) (Temporal)  Resp 16  Ht 6' 2.5" (1.892 m)  Wt 264 lb (119.75 kg)  BMI 33.45 kg/m2  SpO2 97% General Appearance: Well nourished, in no apparent distress.  Eyes: PERRLA, EOMs, conjunctiva no swelling or erythema, normal fundi and vessels.  Sinuses: No Frontal/maxillary tenderness  ENT/Mouth: Ext aud canals clear, normal light reflex with TMs without erythema, bulging. Good dentition. No erythema, swelling, or exudate on post pharynx. Tonsils not swollen or erythematous. Hearing normal.  Neck: Supple, thyroid normal. No bruits  Respiratory: Respiratory effort normal, BS equal bilaterally without rales, rhonchi, wheezing or stridor.  Cardio: RRR without murmurs, rubs or gallops, decreased HS due to body habitus. Brisk peripheral pulses without edema.  Chest: symmetric, with normal excursions and percussion.  Abdomen: Soft, nontender, obese, + ventral hernia, no guarding, rebound, masses, or organomegaly. .  Lymphatics: Non tender without lymphadenopathy.  Musculoskeletal: Full ROM all peripheral extremities,5/5 strength, and normal gait.  Skin: Warm, dry without rashes, lesions, ecchymosis. Neuro: Cranial nerves intact, reflexes equal bilaterally. Normal muscle tone, no cerebellar symptoms. Sensation intact.  Psych: Awake and oriented X 3, normal affect, Insight and Judgment appropriate.   Vicie Mutters 11:32 AM [facility]

## 2015-08-09 ENCOUNTER — Other Ambulatory Visit: Payer: Self-pay | Admitting: Physician Assistant

## 2015-08-09 LAB — VITAMIN D 25 HYDROXY (VIT D DEFICIENCY, FRACTURES): VIT D 25 HYDROXY: 44 ng/mL (ref 30–100)

## 2015-08-09 MED ORDER — GLUCOSE 4 G PO CHEW: 1.0000 | CHEWABLE_TABLET | ORAL | Status: AC | PRN

## 2015-09-26 ENCOUNTER — Ambulatory Visit (INDEPENDENT_AMBULATORY_CARE_PROVIDER_SITE_OTHER): Payer: 59 | Admitting: Physician Assistant

## 2015-09-26 VITALS — BP 118/60 | HR 56 | Temp 97.5°F | Resp 14 | Ht 74.5 in | Wt 267.2 lb

## 2015-09-26 DIAGNOSIS — I712 Thoracic aortic aneurysm, without rupture: Secondary | ICD-10-CM

## 2015-09-26 DIAGNOSIS — E669 Obesity, unspecified: Secondary | ICD-10-CM

## 2015-09-26 DIAGNOSIS — N182 Chronic kidney disease, stage 2 (mild): Secondary | ICD-10-CM | POA: Diagnosis not present

## 2015-09-26 DIAGNOSIS — I10 Essential (primary) hypertension: Secondary | ICD-10-CM | POA: Diagnosis not present

## 2015-09-26 DIAGNOSIS — I7121 Aneurysm of the ascending aorta, without rupture: Secondary | ICD-10-CM

## 2015-09-26 DIAGNOSIS — N521 Erectile dysfunction due to diseases classified elsewhere: Secondary | ICD-10-CM

## 2015-09-26 DIAGNOSIS — E1169 Type 2 diabetes mellitus with other specified complication: Secondary | ICD-10-CM | POA: Diagnosis not present

## 2015-09-26 DIAGNOSIS — E1122 Type 2 diabetes mellitus with diabetic chronic kidney disease: Secondary | ICD-10-CM | POA: Diagnosis not present

## 2015-09-26 DIAGNOSIS — E785 Hyperlipidemia, unspecified: Secondary | ICD-10-CM

## 2015-09-26 NOTE — Progress Notes (Signed)
Diabetes Education and Follow-Up Visit  60 y.o.male presents for diabetic education, he is insulin dependent DM with CKD stage 2, obesity, chol, AAA. He is on bASA, ARB, lipitor.   His A1C improved last visit but his sugar was 44 at the visit and he did not have any hypoglycemic episodes.   Will take 10 units if sugar is at 100, none if below 100 and then eat glucerna shake and banana  Always takes 30 lantus in the AM and night Only checks sugar AM and night time.  Will not check sugar at lunch but will give 10-20 dose depending on what he is eating but admits that he is bad at remembering the dose. Was given a Humalog pen to try to help but states that he is still not remember that.   Lowest sugars are in the morning when he first wakes up was 45 and he did have symptoms at that time, around 3 months ago.   Problem List has Ventral hernia - periumbilical A999333; Obesity with body mass index of 30.0 - 39.9; Hyperlipidemia; Hypertension; Vitamin D deficiency; GERD (gastroesophageal reflux disease); CKD (chronic kidney disease) stage 2, GFR 60-89 ml/min; CKD stage 2 due to type 2 diabetes mellitus ([facility]); Obesity hypoventilation syndrome (HCC); BPH (benign prostatic hypertrophy); Erectile dysfunction associated with type 2 diabetes mellitus ([place]); Hypokalemia; PVC's (premature ventricular contractions); and Aneurysm, ascending aorta (HCC) on his problem list.  Medications Mr. Grinder had no medications administered during this visit.  ROS: no polyuria or polydipsia, no chest pain, dyspnea or TIA's, no numbness, tingling or pain in extremities  Physical Exam: Blood pressure 118/60, pulse (!) 56, temperature 97.5 F (36.4 C), temperature source Temporal, resp. rate 14, height 6' 2.5" (1.892 m), weight 267 lb 3.2 oz (121.2 kg), SpO2 98 %. Body mass index is 33.85 kg/m. General Appearance: Well nourished, in no apparent distress.  Eyes: PERRLA, EOMs, conjunctiva no swelling or erythema, normal fundi  and vessels.  Sinuses: No Frontal/maxillary tenderness  ENT/Mouth: Ext aud canals clear, normal light reflex with TMs without erythema, bulging. Good dentition. No erythema, swelling, or exudate on post pharynx. Tonsils not swollen or erythematous. Hearing normal.  Neck: Supple, thyroid normal. No bruits  Respiratory: Respiratory effort normal, BS equal bilaterally without rales, rhonchi, wheezing or stridor.  Cardio: RRR without murmurs, rubs or gallops, decreased HS due to body habitus. Brisk peripheral pulses without edema.  Chest: symmetric, with normal excursions and percussion.  Abdomen: Soft, nontender, obese, + ventral hernia, no guarding, rebound, masses, or organomegaly. .  Lymphatics: Non tender without lymphadenopathy.  Musculoskeletal: Full ROM all peripheral extremities,5/5 strength, and normal gait.  Skin: Warm, dry without rashes, lesions, ecchymosis. Neuro: Cranial nerves intact, reflexes equal bilaterally. Normal muscle tone, no cerebellar symptoms. Sensation intact.  Psych: Awake and oriented X 3, normal affect, Insight and Judgment appropriate.   Labs: Lab Results  Component Value Date   HGBA1C 7.7 (H) 08/08/2015    Lab Results  Component Value Date   MICROALBUR 0.4 04/18/2015    Lab Results  Component Value Date   CHOL 139 08/08/2015   HDL 57 08/08/2015   LDLCALC 68 08/08/2015   TRIG 69 08/08/2015   CHOLHDL 2.4 08/08/2015     Plan and Assessment: Diabetes Mellitus type 2:  with CKD stage 2 GFR 60-89 Education: Reviewed 'ABCs' of diabetes management (respective goals in parentheses):  A1C (<7), blood pressure (<130/80), and cholesterol (LDL <100) Eye Exam yearly and Dental Exam every 6 months. Dietary recommendations  Physical Activity recommendations He does have glucose tabs and keeps those with him Discussed hypoglycemic symptoms Will refer to endocrinology for possible insulin pump or to discuss this Poorly controlled due to forgetting lunch time  insulin may benefit from pump Start checking lunch time insulin Last eye 12/2014   Blood pressure: normal blood pressure.   An ACE/ARB is currently part of their treatment regimen.  LDL goal of < 100, HDL > 40 and TG < 150. Dyslipidemia under good control. A statin is currently part of their treatment regimen.  Future Appointments Date Time Provider [place]  11/09/2015 10:30 AM Vicie Mutters, PA-C [facility] None  04/17/2016 9:00 AM Vicie Mutters, PA-C [facility] None

## 2015-09-26 NOTE — Patient Instructions (Addendum)
Check sugar 3 x a day, keep record Set alarm on your phone    Bad carbs also include fruit juice, alcohol, and sweet tea. These are empty calories that do not signal to your brain that you are full.   Please remember the good carbs are still carbs which convert into sugar. So please measure them out no more than 1/2-1 cup of rice, oatmeal, pasta, and beans  Veggies are however free foods! Pile them on.   Not all fruit is created equal. Please see the list below, the fruit at the bottom is higher in sugars than the fruit at the top. Please avoid all dried fruits.      Blood Glucose Monitoring, Adult Monitoring your blood glucose (also know as blood sugar) helps you to manage your diabetes. It also helps you and your health care provider monitor your diabetes and determine how well your treatment plan is working. WHY SHOULD YOU MONITOR YOUR BLOOD GLUCOSE?  It can help you understand how food, exercise, and medicine affect your blood glucose.  It allows you to know what your blood glucose is at any given moment. You can quickly tell if you are having low blood glucose (hypoglycemia) or high blood glucose (hyperglycemia).  It can help you and your health care provider know how to adjust your medicines.  It can help you understand how to manage an illness or adjust medicine for exercise. WHEN SHOULD YOU TEST? Your health care provider will help you decide how often you should check your blood glucose. This may depend on the type of diabetes you have, your diabetes control, or the types of medicines you are taking. Be sure to write down all of your blood glucose readings so that this information can be reviewed with your health care provider. See below for examples of testing times that your health care provider may suggest. Type 1 Diabetes  Test at least 2 times per day if your diabetes is well controlled, if you are using an insulin pump, or if you perform multiple daily injections.  If  your diabetes is not well controlled or if you are sick, you may need to test more often.  It is a good idea to also test:  Before every insulin injection.  Before and after exercise.  Between meals and 2 hours after a meal.  Occasionally between 2:00 a.m. and 3:00 a.m. Type 2 Diabetes  If you are taking insulin, test at least 2 times per day. However, it is best to test before every insulin injection.  If you take medicines by mouth (orally), test 2 times a day.  If you are on a controlled diet, test once a day.  If your diabetes is not well controlled or if you are sick, you may need to monitor more often. HOW TO MONITOR YOUR BLOOD GLUCOSE Supplies Needed  Blood glucose meter.  Test strips for your meter. Each meter has its own strips. You must use the strips that go with your own meter.  A pricking needle (lancet).  A device that holds the lancet (lancing device).  A journal or log book to write down your results. Procedure  Wash your hands with soap and water. Alcohol is not preferred.  Prick the side of your finger (not the tip) with the lancet.  Gently milk the finger until a small drop of blood appears.  Follow the instructions that come with your meter for inserting the test strip, applying blood to the strip, and using  your blood glucose meter. Other Areas to Get Blood for Testing Some meters allow you to use other areas of your body (other than your finger) to test your blood. These areas are called alternative sites. The most common alternative sites are:  The forearm.  The thigh.  The back area of the lower leg.  The palm of the hand. The blood flow in these areas is slower. Therefore, the blood glucose values you get may be delayed, and the numbers are different from what you would get from your fingers. Do not use alternative sites if you think you are having hypoglycemia. Your reading will not be accurate. Always use a finger if you are having  hypoglycemia. Also, if you cannot feel your lows (hypoglycemia unawareness), always use your fingers for your blood glucose checks. ADDITIONAL TIPS FOR GLUCOSE MONITORING  Do not reuse lancets.  Always carry your supplies with you.  All blood glucose meters have a 24-hour "hotline" number to call if you have questions or need help.  Adjust (calibrate) your blood glucose meter with a control solution after finishing a few boxes of strips. BLOOD GLUCOSE RECORD KEEPING It is a good idea to keep a daily record or log of your blood glucose readings. Most glucose meters, if not all, keep your glucose records stored in the meter. Some meters come with the ability to download your records to your home computer. Keeping a record of your blood glucose readings is especially helpful if you are wanting to look for patterns. Make notes to go along with the blood glucose readings because you might forget what happened at that exact time. Keeping good records helps you and your health care provider to work together to achieve good diabetes management.    This information is not intended to replace advice given to you by your health care provider. Make sure you discuss any questions you have with your health care provider.   Document Released: 02/20/2003 Document Revised: 03/10/2014 Document Reviewed: 07/12/2012 Elsevier Interactive Patient Education [employer].  Diabetes is a very complicated disease...lets simplify it.  An easy way to look at it to understand the complications is if you think of the extra sugar floating in your blood stream as glass shards floating through your blood stream.    Diabetes affects your small vessels first: 1) The glass shards (sugar) scraps down the tiny blood vessels in your eyes and lead to diabetic retinopathy, the leading cause of blindness in the [place]. Diabetes is the leading cause of newly diagnosed adult (85 to 61 years of age) blindness in the [place].  2) The glass shards scratches down the tiny vessels of your legs leading to nerve damage called neuropathy and can lead to amputations of your feet. More than 60% of all non-traumatic amputations of lower limbs occur in people with diabetes.  3) Over time the small vessels in your brain are shredded and closed off, individually this does not cause any problems but over a long period of time many of the small vessels being blocked can lead to Vascular Dementia.   4) Your kidney's are a filter system and have a "net" that keeps certain things in the body and lets bad things out. Sugar shreds this net and leads to kidney damage and eventually failure. Decreasing the sugar that is destroying the net and certain blood pressure medications can help stop or decrease progression of kidney disease. Diabetes was the primary cause of kidney failure in 64  percent of all new cases in 2011.  5) Diabetes also destroys the small vessels in your penis that lead to erectile dysfunction. Eventually the vessels are so damaged that you may not be responsive to cialis or viagra.   Diabetes and your large vessels: Your larger vessels consist of your coronary arteries in your heart and the carotid vessels to your brain. Diabetes or even increased sugars put you at 300% increased risk of heart attack and stroke and this is why.. The sugar scrapes down your large blood vessels and your body sees this as an internal injury and tries to repair itself. Just like you get a scab on your skin, your platelets will stick to the blood vessel wall trying to heal it. This is why we have diabetics on low dose aspirin daily, this prevents the platelets from sticking and can prevent plaque formation. In addition, your body takes cholesterol and tries to shove it into the open wound. This is why we want your LDL, or bad cholesterol, below 70.   The combination of platelets and cholesterol over 5-10 years forms plaque that can break  off and cause a heart attack or stroke.   PLEASE REMEMBER:  Diabetes is preventable! Up to 33 percent of complications and morbidities among individuals with type 2 diabetes can be prevented, delayed, or effectively treated and minimized with regular visits to a health professional, appropriate monitoring and medication, and a healthy diet and lifestyle.

## 2015-10-15 ENCOUNTER — Other Ambulatory Visit: Payer: Self-pay | Admitting: Internal Medicine

## 2015-10-16 ENCOUNTER — Other Ambulatory Visit: Payer: Self-pay | Admitting: *Deleted

## 2015-10-16 MED ORDER — OMEPRAZOLE 20 MG PO CPDR: DELAYED_RELEASE_CAPSULE | ORAL | 1 refills | Status: DC

## 2015-10-16 MED ORDER — ATORVASTATIN CALCIUM 40 MG PO TABS: 40.0000 mg | ORAL_TABLET | Freq: Every day | ORAL | 1 refills | Status: DC

## 2015-10-29 ENCOUNTER — Other Ambulatory Visit: Payer: Self-pay | Admitting: Internal Medicine

## 2015-11-09 ENCOUNTER — Ambulatory Visit: Payer: Self-pay | Admitting: Physician Assistant

## 2015-11-12 ENCOUNTER — Other Ambulatory Visit: Payer: Self-pay | Admitting: *Deleted

## 2015-11-12 ENCOUNTER — Ambulatory Visit (INDEPENDENT_AMBULATORY_CARE_PROVIDER_SITE_OTHER): Payer: 59 | Admitting: Internal Medicine

## 2015-11-12 ENCOUNTER — Telehealth: Payer: Self-pay | Admitting: Internal Medicine

## 2015-11-12 ENCOUNTER — Encounter: Payer: Self-pay | Admitting: Internal Medicine

## 2015-11-12 DIAGNOSIS — E1122 Type 2 diabetes mellitus with diabetic chronic kidney disease: Secondary | ICD-10-CM

## 2015-11-12 DIAGNOSIS — E1121 Type 2 diabetes mellitus with diabetic nephropathy: Secondary | ICD-10-CM

## 2015-11-12 DIAGNOSIS — E139 Other specified diabetes mellitus without complications: Secondary | ICD-10-CM | POA: Insufficient documentation

## 2015-11-12 DIAGNOSIS — N182 Chronic kidney disease, stage 2 (mild): Secondary | ICD-10-CM

## 2015-11-12 DIAGNOSIS — E1165 Type 2 diabetes mellitus with hyperglycemia: Secondary | ICD-10-CM | POA: Diagnosis not present

## 2015-11-12 DIAGNOSIS — Z794 Long term (current) use of insulin: Secondary | ICD-10-CM

## 2015-11-12 DIAGNOSIS — IMO0002 Reserved for concepts with insufficient information to code with codable children: Secondary | ICD-10-CM

## 2015-11-12 LAB — POCT GLYCOSYLATED HEMOGLOBIN (HGB A1C): HEMOGLOBIN A1C: 7.1

## 2015-11-12 MED ORDER — METFORMIN HCL 500 MG PO TABS: 1000.0000 mg | ORAL_TABLET | Freq: Two times a day (BID) | ORAL | 3 refills | Status: DC

## 2015-11-12 MED ORDER — INSULIN GLARGINE 100 UNIT/ML ~~LOC~~ SOLN: SUBCUTANEOUS | 2 refills | Status: DC

## 2015-11-12 MED ORDER — INSULIN LISPRO 100 UNIT/ML ~~LOC~~ SOLN: SUBCUTANEOUS | 5 refills | Status: DC

## 2015-11-12 NOTE — Telephone Encounter (Signed)
error 

## 2015-11-12 NOTE — Progress Notes (Signed)
Patient ID: Harry [name], male   DOB: 1955-08-18, 60 y.o.   MRN: [id number]   HPI: Harry [name] is a 60 y.o.-year-old male, referred by his PCP, Vicie Mutters PA (MCKEOWN,WILLIAM DAVID, MD), for management of DM2, dx in early 1990s, insulin-dependent since 1 year after dx, uncontrolled, with complications (CKD stage 2, ED). He saw Dr. Electa Sniff before   Last hemoglobin A1c was: Lab Results  Component Value Date   HGBA1C 7.7 (H) 08/08/2015   HGBA1C 8.1 (H) 04/18/2015   HGBA1C 8.0 (H) 12/06/2014   Pt is on a regimen of: - Metformin 500 mg 3x a day, with meals - Lantus 35 units 2x a day. - Humalog   - before b'fast: 10-25 units - before lunch: 15 units (almost never)  - before dinner: never  - before bedtime: 25-40 units (!) - if if <200: takes ~20 units; if >300: 35-40 units. He was on Metformin 500 mg 3x a day, with meals >> stopped.  Pt checks his sugars 2x a day and they are: - am: 47, 72-265 - 2h after b'fast: 180-313 - before lunch: n/c - 2h after lunch: n/c - before dinner: 268 - 2h after dinner: 117, 169-440 - bedtime: 122-423 - nighttime: n/c + rarely lows. Lowest sugar was 39 (at night; after exercise); he has hypoglycemia awareness at 60.  Highest sugar was in the 400.   Glucometer: one touch ultra 2  Pt's meals are: - Breakfast: Glucerna, banana - Lunch: eats out - salad with chicken; chicken burrito; sandwich; [employer] - Dinner: meat + veggie + starch - Snacks: potato chips, fruit, icecream No sodas.  - no CKD, last BUN/creatinine:  Lab Results  Component Value Date   BUN 12 08/08/2015   BUN 17 04/18/2015   CREATININE 1.00 08/08/2015   CREATININE 0.97 04/18/2015  On Losartan. - last set of lipids: Lab Results  Component Value Date   CHOL 139 08/08/2015   HDL 57 08/08/2015   LDLCALC 68 08/08/2015   TRIG 69 08/08/2015   CHOLHDL 2.4 08/08/2015  On Lipitor.  - last eye exam was 1 year - Dr. Gershon Crane. No DR.  - no numbness and tingling in his  feet.  Pt has no FH of DM.  He also has a history of HTN, HL, GERD.  ROS: Constitutional: no weight gain/loss, + fatigue, no subjective hyperthermia/hypothermia Eyes: no blurry vision, no xerophthalmia ENT: no sore throat, no nodules palpated in throat, no dysphagia/odynophagia, no hoarseness Cardiovascular: no CP/SOB/palpitations/leg swelling Respiratory: no cough/SOB Gastrointestinal: no N/V/D/C/+ heartburn Musculoskeletal: no muscle/joint aches Skin: no rashes Neurological: no tremors/numbness/tingling/dizziness Psychiatric: no depression/anxiety + Difficulty with erections.   Past Medical History:  Diagnosis Date  . CKD (chronic kidney disease) stage 2, GFR 60-89 ml/min    secondary to DM  . Diabetes mellitus without complication ([place]) A999333   insulin requiring  . GERD (gastroesophageal reflux disease)   . Hyperlipidemia   . Hypertension   . Hypokalemia   . Hypomagnesemia   . OSA on CPAP    10-12 years ago  . Vitamin D deficiency    Past Surgical History:  Procedure Laterality Date  . ANAL FISSURE REPAIR  2007  . VENTRAL HERNIA REPAIR  04/13/12   Dr. Johney Maine, ventral hernia repair w/ mesh   Social History   Social History  . Marital status: Married    Spouse name: N/A  . Number of children: 2   Occupational History  . estimator   Social History Main Topics  .  Smoking status: Never Smoker  . Smokeless tobacco: Not on file  . Alcohol use 0.0 oz/week    2 - 5 Cans of beer per week   Current Outpatient Prescriptions on File Prior to Visit  Medication Sig Dispense Refill  . aspirin 81 MG tablet Take 81 mg by mouth daily.    Marland Kitchen atorvastatin (LIPITOR) 40 MG tablet Take 1 tablet (40 mg total) by mouth daily. for cholesterol 90 tablet 1  . BD INSULIN SYRINGE ULTRAFINE 31G X 5/16" 0.5 ML MISC USE TWICE A DAY AS DIRECTED 200 each 49  . bisoprolol-hydrochlorothiazide (ZIAC) 5-6.25 MG tablet TAKE 1 TABLET DAILY FOR BLOOD PRESSURE (NEED OFFICE VISIT BEFORE ANY ADDITIONAL  REFILLS AUTHORIZED) 90 tablet 1  . glucose 4 GM chewable tablet Chew 1 tablet (4 g total) by mouth as needed for low blood sugar. 30 tablet 12  . losartan-hydrochlorothiazide (HYZAAR) 100-12.5 MG tablet TAKE 1 TABLET DAILY 90 tablet 1  . omeprazole (PRILOSEC) 20 MG capsule TAKE 1 CAPSULE DAILY FOR ACID REFLUX 90 capsule 1  . ONE TOUCH ULTRA TEST test strip USE TO TAKE GLUCOSE THREE TO FOUR TIMES DAILY FOR FLUCTUATING BLOOD SUGARS 400 each 3  . potassium chloride SA (K-DUR,KLOR-CON) 20 MEQ tablet TAKE 1 TABLET TWICE A DAY 180 tablet 1  . tadalafil (CIALIS) 20 MG tablet Take 1 tablet (20 mg total) by mouth daily as needed for erectile dysfunction. 60 tablet 3   No current facility-administered medications on file prior to visit.    Allergies  Allergen Reactions  . Crestor [Rosuvastatin]    Family History  Problem Relation Age of Onset  . Cancer Father     kidney    PE: BP 134/71   Pulse 84   Temp 98 F (36.7 C)   Resp 16   Ht 6\' 2"  (1.88 m)   Wt 269 lb 3.2 oz (122.1 kg)   SpO2 98%   BMI 34.56 kg/m  Wt Readings from Last 3 Encounters:  11/12/15 269 lb 3.2 oz (122.1 kg)  09/26/15 267 lb 3.2 oz (121.2 kg)  08/08/15 264 lb (119.7 kg)   Constitutional: overweight, in NAD Eyes: PERRLA, EOMI, no exophthalmos ENT: moist mucous membranes, no thyromegaly, no cervical lymphadenopathy Cardiovascular: RRR, + 1 SEM, noRG Respiratory: CTA B Gastrointestinal: abdomen soft, NT, ND, BS+ Musculoskeletal: no deformities, strength intact in all 4 Skin: moist, warm, no rashes Neurological: no tremor with outstretched hands, DTR normal in all 4  ASSESSMENT: 1. DM2, insulin-dependent, uncontrolled, with complications - CKD stage 2 - ED  PLAN:  1. Patient with long-standing, uncontrolled diabetes, on oral antidiabetic regimen, which became insufficient. His sugars are lower in a.m. and increase progressively throughout the day. Surprisingly, his HbA1c is only 7.1% today. - He is taking a  large amount of basal compared to rapid acting insulin. He is only taking Humalog in a.m., which renders his sugars to increase in a stepwise fashion. Because sugars at night are usually between 200-450, he takes a large amount of Humalog at bedtime. Subsequently, he may develop lows at night (lowest 39) and has variable blood sugars in a.m. he does not remember why his stop metformin. - We discussed about restarting metformin and buildup to target dose of 1000 mg twice a day. He agrees to start this. His GFR is over 80. We we'll continue Lantus at the current dose, but I gave him a consistent mealtime insulin regimen. He will inject this before every meal, depending on the size of  the meal. No sliding scale for now, but we can add this in the future.  - also tells me that he would be interested in insulin pump, and we briefly discussed about this. At next visit, we can check a C-peptide to see if his insulin deficient, but I highly suspect that he does have insulin deficiency. - We also discussed about diet and he will let me know when he is ready for a nutrition appointment - I suggested to start a consistent exercise regimen. He has an elliptical machine at home, which he is thinking about restarting using. - Iadvised him to  to:  Patient Instructions  Please start Metformin 500 mg with dinner x 4 days. If you tolerate this well, add another Metformin tablet (500 mg) with breakfast x 4 days. If you tolerate this well, add another metformin tablet with dinner (total 1000 mg) x 4 days. If you tolerate this well, add another metformin tablet with breakfast (total 1000 mg). Continue with 1000 mg of metformin 2x a day with breakfast and dinner.  Continue Lantus 35 units 2x a day.  Change Humalog: - 12 units before a smaller meal - 15 units before a regular meal - 20 units before a larger meal or if you have dessert.  Do not take any Humalog at bedtime.  Please return in 1.5 months with your sugar  log.   - Continue  checking sugars at different times of the day - check 3 times a day, rotating checks - given sugar log and advised how to fill it and to bring it at next appt  - given foot care handout and explained the principles  - given instructions for hypoglycemia management "15-15 rule"  - advised for yearly eye exams >>  he needs to schedule another appointment with Dr. Gershon Crane.  - Return to clinic in 1.5 mo with sugar log   [name], MD PhD [facility] Endocrinology

## 2015-11-12 NOTE — Patient Instructions (Addendum)
Please start Metformin 500 mg with dinner x 4 days. If you tolerate this well, add another Metformin tablet (500 mg) with breakfast x 4 days. If you tolerate this well, add another metformin tablet with dinner (total 1000 mg) x 4 days. If you tolerate this well, add another metformin tablet with breakfast (total 1000 mg). Continue with 1000 mg of metformin 2x a day with breakfast and dinner.  Continue Lantus 35 units 2x a day.  Change Humalog: - 12 units before a smaller meal - 15 units before a regular meal - 20 units before a larger meal or if you have dessert.  Do not take any Humalog at bedtime.  Please return in 1.5 months with your sugar log.   PATIENT INSTRUCTIONS FOR TYPE 2 DIABETES:  DIET AND EXERCISE Diet and exercise is an important part of diabetic treatment.  We recommended aerobic exercise in the form of brisk walking (working between 40-60% of maximal aerobic capacity, similar to brisk walking) for 150 minutes per week (such as 30 minutes five days per week) along with 3 times per week performing 'resistance' training (using various gauge rubber tubes with handles) 5-10 exercises involving the major muscle groups (upper body, lower body and core) performing 10-15 repetitions (or near fatigue) each exercise. Start at half the above goal but build slowly to reach the above goals. If limited by weight, joint pain, or disability, we recommend daily walking in a swimming pool with water up to waist to reduce pressure from joints while allow for adequate exercise.    BLOOD GLUCOSES Monitoring your blood glucoses is important for continued management of your diabetes. Please check your blood glucoses 2-4 times a day: fasting, before meals and at bedtime (you can rotate these measurements - e.g. one day check before the 3 meals, the next day check before 2 of the meals and before bedtime, etc.).   HYPOGLYCEMIA (low blood sugar) Hypoglycemia is usually a reaction to not eating,  exercising, or taking too much insulin/ other diabetes drugs.  Symptoms include tremors, sweating, hunger, confusion, headache, etc. Treat IMMEDIATELY with 15 grams of Carbs: . 4 glucose tablets .  cup regular juice/soda . 2 tablespoons raisins . 4 teaspoons sugar . 1 tablespoon honey Recheck blood glucose in 15 mins and repeat above if still symptomatic/blood glucose <100.  RECOMMENDATIONS TO REDUCE YOUR RISK OF DIABETIC COMPLICATIONS: * Take your prescribed MEDICATION(S) * Follow a DIABETIC diet: Complex carbs, fiber rich foods, (monounsaturated and polyunsaturated) fats * AVOID saturated/trans fats, high fat foods, >2,300 mg salt per day. * EXERCISE at least 5 times a week for 30 minutes or preferably daily.  * DO NOT SMOKE OR DRINK more than 1 drink a day. * Check your FEET every day. Do not wear tightfitting shoes. Contact us if you develop an ulcer * See your EYE doctor once a year or more if needed * Get a FLU shot once a year * Get a PNEUMONIA vaccine once before and once after age 39 years  GOALS:  * Your Hemoglobin A1c of <7%  * fasting sugars need to be <130 * after meals sugars need to be <180 (2h after you start eating) * Your Systolic BP should be [id number] or lower  * Your Diastolic BP should be 80 or lower  * Your HDL (Good Cholesterol) should be 40 or higher  * Your LDL (Bad Cholesterol) should be 100 or lower. * Your Triglycerides should be 150 or lower  * Your Urine microalbumin (  kidney function) should be <30 * Your Body Mass Index should be 25 or lower    Please consider the following ways to cut down carbs and fat and increase fiber and micronutrients in your diet: - substitute whole grain for white bread or pasta - substitute brown rice for white rice - substitute 90-calorie flat bread pieces for slices of bread when possible - substitute sweet potatoes or yams for white potatoes - substitute humus for margarine - substitute tofu for cheese when possible -  substitute almond or rice milk for regular milk (would not drink soy milk daily due to concern for soy estrogen influence on breast cancer risk) - substitute dark chocolate for other sweets when possible - substitute water - can add lemon or orange slices for taste - for diet sodas (artificial sweeteners will trick your body that you can eat sweets without getting calories and will lead you to overeating and weight gain in the long run) - do not skip breakfast or other meals (this will slow down the metabolism and will result in more weight gain over time)  - can try smoothies made from fruit and almond/rice milk in am instead of regular breakfast - can also try old-fashioned (not instant) oatmeal made with almond/rice milk in am - order the dressing on the side when eating salad at a restaurant (pour less than half of the dressing on the salad) - eat as little meat as possible - can try juicing, but should not forget that juicing will get rid of the fiber, so would alternate with eating raw veg./fruits or drinking smoothies - use as little oil as possible, even when using olive oil - can dress a salad with a mix of balsamic vinegar and lemon juice, for e.g. - use agave nectar, stevia sugar, or regular sugar rather than artificial sweateners - steam or broil/roast veggies  - snack on veggies/fruit/nuts (unsalted, preferably) when possible, rather than processed foods - reduce or eliminate aspartame in diet (it is in diet sodas, chewing gum, etc) Read the labels!  Try to read Dr. Janene Harvey book: "Program for Reversing Diabetes" for other ideas for healthy eating.

## 2015-11-13 ENCOUNTER — Other Ambulatory Visit: Payer: Self-pay | Admitting: Physician Assistant

## 2015-11-26 ENCOUNTER — Ambulatory Visit (INDEPENDENT_AMBULATORY_CARE_PROVIDER_SITE_OTHER): Payer: 59 | Admitting: Physician Assistant

## 2015-11-26 ENCOUNTER — Encounter: Payer: Self-pay | Admitting: Physician Assistant

## 2015-11-26 VITALS — BP 130/80 | HR 65 | Temp 97.5°F | Resp 16 | Ht 74.0 in | Wt 269.0 lb

## 2015-11-26 DIAGNOSIS — E1121 Type 2 diabetes mellitus with diabetic nephropathy: Secondary | ICD-10-CM

## 2015-11-26 DIAGNOSIS — E559 Vitamin D deficiency, unspecified: Secondary | ICD-10-CM | POA: Diagnosis not present

## 2015-11-26 DIAGNOSIS — I712 Thoracic aortic aneurysm, without rupture: Secondary | ICD-10-CM

## 2015-11-26 DIAGNOSIS — E1122 Type 2 diabetes mellitus with diabetic chronic kidney disease: Secondary | ICD-10-CM | POA: Diagnosis not present

## 2015-11-26 DIAGNOSIS — N182 Chronic kidney disease, stage 2 (mild): Secondary | ICD-10-CM | POA: Diagnosis not present

## 2015-11-26 DIAGNOSIS — I10 Essential (primary) hypertension: Secondary | ICD-10-CM | POA: Diagnosis not present

## 2015-11-26 DIAGNOSIS — Z79899 Other long term (current) drug therapy: Secondary | ICD-10-CM | POA: Diagnosis not present

## 2015-11-26 DIAGNOSIS — E1165 Type 2 diabetes mellitus with hyperglycemia: Secondary | ICD-10-CM

## 2015-11-26 DIAGNOSIS — N521 Erectile dysfunction due to diseases classified elsewhere: Secondary | ICD-10-CM | POA: Diagnosis not present

## 2015-11-26 DIAGNOSIS — I7121 Aneurysm of the ascending aorta, without rupture: Secondary | ICD-10-CM

## 2015-11-26 DIAGNOSIS — E1169 Type 2 diabetes mellitus with other specified complication: Secondary | ICD-10-CM | POA: Diagnosis not present

## 2015-11-26 DIAGNOSIS — E785 Hyperlipidemia, unspecified: Secondary | ICD-10-CM | POA: Diagnosis not present

## 2015-11-26 DIAGNOSIS — Z794 Long term (current) use of insulin: Secondary | ICD-10-CM

## 2015-11-26 DIAGNOSIS — IMO0002 Reserved for concepts with insufficient information to code with codable children: Secondary | ICD-10-CM

## 2015-11-26 LAB — BASIC METABOLIC PANEL WITH GFR
BUN: 19 mg/dL (ref 7–25)
CALCIUM: 9.3 mg/dL (ref 8.6–10.3)
CHLORIDE: 100 mmol/L (ref 98–110)
CO2: 30 mmol/L (ref 20–31)
Creat: 1.17 mg/dL (ref 0.70–1.25)
GFR, EST NON AFRICAN AMERICAN: 67 mL/min (ref >=60)
GFR, Est African American: 77 mL/min (ref >=60)
GLUCOSE: 80 mg/dL (ref 65–99)
Potassium: 3.5 mmol/L (ref 3.5–5.3)
Sodium: 138 mmol/L (ref 135–146)

## 2015-11-26 LAB — CBC WITH DIFFERENTIAL/PLATELET
Basophils Absolute: 66 cells/uL (ref 0–200)
Basophils Relative: 1 %
Eosinophils Absolute: 132 cells/uL (ref 15–500)
Eosinophils Relative: 2 %
HEMATOCRIT: 45.9 % (ref 38.5–50.0)
HEMOGLOBIN: 15.4 g/dL (ref 13.2–17.1)
LYMPHS PCT: 23 %
Lymphs Abs: 1518 cells/uL (ref 850–3900)
MCH: 28.1 pg (ref 27.0–33.0)
MCHC: 33.6 g/dL (ref 32.0–36.0)
MCV: 83.6 fL (ref 80.0–100.0)
MPV: 10.5 fL (ref 7.5–12.5)
Monocytes Absolute: 726 cells/uL (ref 200–950)
Monocytes Relative: 11 %
NEUTROS PCT: 63 %
Neutro Abs: 4158 cells/uL (ref 1500–7800)
Platelets: 205 10*3/uL (ref 140–400)
RBC: 5.49 MIL/uL (ref 4.20–5.80)
RDW: 13.9 % (ref 11.0–15.0)
WBC: 6.6 10*3/uL (ref 3.8–10.8)

## 2015-11-26 LAB — HEPATIC FUNCTION PANEL
ALT: 18 U/L (ref 9–46)
AST: 20 U/L (ref 10–35)
Albumin: 4 g/dL (ref 3.6–5.1)
Alkaline Phosphatase: 67 U/L (ref 40–115)
BILIRUBIN INDIRECT: 0.7 mg/dL (ref 0.2–1.2)
Bilirubin, Direct: 0.2 mg/dL (ref <=0.2)
TOTAL PROTEIN: 6.5 g/dL (ref 6.1–8.1)
Total Bilirubin: 0.9 mg/dL (ref 0.2–1.2)

## 2015-11-26 LAB — LIPID PANEL
CHOLESTEROL: 165 mg/dL (ref 125–200)
HDL: 48 mg/dL (ref >=40)
LDL CALC: 70 mg/dL (ref <130)
Total CHOL/HDL Ratio: 3.4 Ratio (ref <=5.0)
Triglycerides: 235 mg/dL — ABNORMAL HIGH (ref <150)
VLDL: 47 mg/dL — AB (ref <30)

## 2015-11-26 MED ORDER — BISOPROLOL-HYDROCHLOROTHIAZIDE 5-6.25 MG PO TABS: ORAL_TABLET | ORAL | 1 refills | Status: DC

## 2015-11-26 MED ORDER — INSULIN GLARGINE 100 UNIT/ML ~~LOC~~ SOLN: SUBCUTANEOUS | 2 refills | Status: DC

## 2015-11-26 MED ORDER — INSULIN LISPRO 100 UNIT/ML ~~LOC~~ SOLN: SUBCUTANEOUS | 4 refills | Status: DC

## 2015-11-26 NOTE — Patient Instructions (Signed)
We are starting you on Metformin to prevent or treat diabetes. Metformin does not cause low blood sugars. In order to create energy your cells need insulin and sugar but sometime your cells do not accept the insulin and this can cause increased sugars and decreased energy. The Metformin helps your cells accept insulin and the sugar to give you more energy.   The two most common side effects are nausea and diarrhea, follow these rules to avoid it! You can take imodium per box instructions when starting metformin if needed.   Rules of metformin: 1) start out slow with only one pill daily. Our goal for you is 4 pills a day or 2000mg  total.  2) take with your largest meal. 3) Take with least amount of carbs.   Call if you have any problems.    Your A1C is a measure of your sugar over the past 3 months and is not affected by what you have eaten over the past few days. Diabetes increases your chances of stroke and heart attack over 300 % and is the leading cause of blindness and kidney failure in the [place]. Please make sure you decrease bad carbs like white bread, white rice, potatoes, corn, soft drinks, pasta, cereals, refined sugars, sweet tea, dried fruits, and fruit juice. Good carbs are okay to eat in moderation like sweet potatoes, brown rice, whole grain pasta/bread, most fruit (except dried fruit) and you can eat as many veggies as you want.   Greater than 6.5 is considered diabetic. Between 6.4 and 5.7 is prediabetic If your A1C is less than 5.7 you are NOT diabetic.  Targets for Glucose Readings: Time of Check Target for patients WITHOUT Diabetes Target for DIABETICS  Before Meals Less than 100  less than 150  Two hours after meals Less than 200  Less than 250

## 2015-11-26 NOTE — Progress Notes (Signed)
3 MONTH FOLLOW UP   Assessment and Plan:  Essential hypertension - continue medications, DASH diet, exercise and monitor at home. Call if greater than 130/80.  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   CKD (chronic kidney disease) stage 2, GFR 60-89 ml/min Increase fluids, avoid NSAIDS, monitor sugars, will monitor - BASIC METABOLIC PANEL WITH GFR  Obesity (BMI 33) Obesity with co morbidities- long discussion about weight loss, diet, and exercise - Testosterone  Hyperlipidemia -continue medications, check lipids, decrease fatty foods, increase activity.  - Lipid panel   Vitamin D deficiency - Vit D  25 hydroxy (rtn osteoporosis monitoring)  Ascending aorta aneurysm Control blood pressure, cholesterol, glucose, increase exercise.  Continue cardio follow up   Discussed med's effects and SE's. Screening labs and tests as requested with regular follow-up as recommended. Future Appointments Date Time Provider [place]  01/04/2016 9:30 AM [name], MD [id number] None  04/17/2016 9:00 AM Vicie Mutters, PA-C [id number] None    HPI Patient presents for 3 month follow up for HTN, chol, DM with CKD, AAA.   His blood pressure has not been checked at home, today their BP is BP: 130/80 He does not workout, but has been building a fence that is strenuous work. He denies chest pain, shortness of breath, dizziness, shortness breath.  Had normal stress test and echo with Dr. 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   He is on cholesterol medication, lipitor 40mg  and denies  myalgias. His cholesterol is at goal. The cholesterol last visit was:   Lab Results  Component Value Date   CHOL 139 08/08/2015   HDL 57 08/08/2015   LDLCALC 68 08/08/2015   TRIG 69 08/08/2015   CHOLHDL 2.4 08/08/2015   He has been working on diet and exercise for insulin dependent diabetes with CKD stage 2, he has had DM x 30 + years, he is on bASA, he is on ACE/ARB, he is checking his sugars at night, he is now following with [name], he has started back on his metformin 2 a day but having diarrhea, and is still on latus 35 units BID, and on humolog 12-20 units before meals depending on size, denies any low blood sugars, lowest is 60, normally not taking any humolog at bed time and denies paresthesia of the feet, polydipsia, polyuria and visual disturbances. Last A1C in the office was:  Lab Results  Component Value Date   HGBA1C 7.1 11/12/2015  Patient is on Vitamin D supplement.   Lab Results  Component Value Date   VD25OH 44 08/08/2015   BMI is Body mass index is 34.54 kg/m., he is working on diet and exercise. Due to his obesity he has OSA and is on CPAP.  Wt Readings from Last 3 Encounters:  11/26/15 269 lb (122 kg)  11/12/15 269 lb 3.2 oz (122.1 kg)  09/26/15 267 lb 3.2 oz (121.2 kg)     Current Medications:  Current Outpatient Prescriptions on File Prior to Visit  Medication Sig Dispense Refill  . aspirin 81 MG tablet Take 81 mg by mouth daily.    Marland Kitchen atorvastatin (LIPITOR) 40 MG tablet Take 1 tablet (40 mg total) by mouth  daily. for cholesterol 90 tablet 1  . BD INSULIN SYRINGE ULTRAFINE 31G X 5/16" 0.5 ML MISC USE TWICE A DAY AS DIRECTED 200 each 49  . bisoprolol-hydrochlorothiazide (ZIAC) 5-6.25 MG tablet TAKE 1 TABLET DAILY FOR BLOOD PRESSURE (NEED OFFICE VISIT BEFORE ANY ADDITIONAL REFILLS AUTHORIZED) 90 tablet 1  . glucose 4 GM chewable tablet Chew 1 tablet (4 g total) by mouth as needed for low blood sugar. 30 tablet 12  . HUMALOG 100 UNIT/ML injection INJECT 60 TO  80 UNITS UNDER THE SKIN BEFORE EACH MEAL DAILY 210 mL 4  . insulin glargine (LANTUS) 100 UNIT/ML injection Inject 30-35 units under skin every day 20 mL 2  . losartan-hydrochlorothiazide (HYZAAR) 100-12.5 MG tablet TAKE 1 TABLET DAILY 90 tablet 1  . metFORMIN (GLUCOPHAGE) 500 MG tablet Take 2 tablets (1,000 mg total) by mouth 2 (two) times daily with a meal. 360 tablet 3  . omeprazole (PRILOSEC) 20 MG capsule TAKE 1 CAPSULE DAILY FOR ACID REFLUX 90 capsule 1  . ONE TOUCH ULTRA TEST test strip USE TO TAKE GLUCOSE THREE TO FOUR TIMES DAILY FOR FLUCTUATING BLOOD SUGARS 400 each 3  . potassium chloride SA (K-DUR,KLOR-CON) 20 MEQ tablet TAKE 1 TABLET TWICE A DAY 180 tablet 1  . tadalafil (CIALIS) 20 MG tablet Take 1 tablet (20 mg total) by mouth daily as needed for erectile dysfunction. 60 tablet 3   No current facility-administered medications on file prior to visit.     Allergies:  Allergies  Allergen Reactions  . Crestor [Rosuvastatin]    Medical History:  Past Medical History:  Diagnosis Date  . CKD (chronic kidney disease) stage 2, GFR 60-89 ml/min    secondary to DM  . Diabetes mellitus without complication ([place]) A999333   insulin requiring  . GERD (gastroesophageal reflux disease)   . Hyperlipidemia   . Hypertension   . Hypokalemia   . Hypomagnesemia   . OSA on CPAP    10-12 years ago  . Vitamin D deficiency    Surgical History:  Past Surgical History:  Procedure Laterality Date  . ANAL FISSURE REPAIR  2007  . VENTRAL HERNIA REPAIR  04/13/12   Dr. Johney Maine, ventral hernia repair w/ mesh   Family History:  Family History  Problem Relation Age of Onset  . Cancer Father     kidney   Social History:   Social History  Substance Use Topics  . Smoking status: Never Smoker  . Smokeless tobacco: Not on file  . Alcohol use 0.0 oz/week    2 - 5 Cans of beer per week     Comment: weekly   Review of Systems:  Review of Systems  Constitutional: Negative.   HENT: Negative.    Eyes: Negative.   Respiratory: Positive for shortness of breath (with exertion). Negative for cough, hemoptysis, sputum production and wheezing.   Cardiovascular: Negative.   Gastrointestinal: Negative.  Heartburn: better with PPI.  Genitourinary: Negative.   Musculoskeletal: Negative.   Skin: Negative.   Neurological: Negative.   Endo/Heme/Allergies: Negative.   Psychiatric/Behavioral: Negative.     Physical Exam: Estimated body mass index is 34.54 kg/m as calculated from the following:   Height as of this encounter: 6\' 2"  (1.88 m).   Weight as of this encounter: 269 lb (122 kg). BP 130/80   Pulse 65   Temp 97.5 F (36.4 C)   Resp 16   Ht 6\' 2"  (1.88 m)   Wt 269 lb (122 kg)   SpO2 97%  BMI 34.54 kg/m  General Appearance: Well nourished, in no apparent distress.  Eyes: PERRLA, EOMs, conjunctiva no swelling or erythema, normal fundi and vessels.  Sinuses: No Frontal/maxillary tenderness  ENT/Mouth: Ext aud canals clear, normal light reflex with TMs without erythema, bulging. Good dentition. No erythema, swelling, or exudate on post pharynx. Tonsils not swollen or erythematous. Hearing normal.  Neck: Supple, thyroid normal. No bruits  Respiratory: Respiratory effort normal, BS equal bilaterally without rales, rhonchi, wheezing or stridor.  Cardio: RRR without murmurs, rubs or gallops, decreased HS due to body habitus. Brisk peripheral pulses without edema.  Chest: symmetric, with normal excursions and percussion.  Abdomen: Soft, nontender, obese, + ventral hernia, no guarding, rebound, masses, or organomegaly.  Lymphatics: Non tender without lymphadenopathy.  Musculoskeletal: Full ROM all peripheral extremities,5/5 strength, and normal gait.  Skin: Warm, dry without rashes, lesions, ecchymosis. Neuro: Cranial nerves intact, reflexes equal bilaterally. Normal muscle tone, no cerebellar symptoms. Sensation intact.  Psych: Awake and oriented X 3, normal affect, Insight and  Judgment appropriate.   Vicie Mutters 4:44 PM [facility]

## 2015-11-27 LAB — TSH: TSH: 2.18 mIU/L (ref 0.40–4.50)

## 2016-01-04 ENCOUNTER — Ambulatory Visit: Payer: 59 | Admitting: Internal Medicine

## 2016-01-10 ENCOUNTER — Other Ambulatory Visit: Payer: Self-pay | Admitting: Physician Assistant

## 2016-01-11 ENCOUNTER — Ambulatory Visit (INDEPENDENT_AMBULATORY_CARE_PROVIDER_SITE_OTHER): Payer: 59 | Admitting: Internal Medicine

## 2016-01-11 ENCOUNTER — Encounter: Payer: Self-pay | Admitting: Internal Medicine

## 2016-01-11 VITALS — BP 136/78 | HR 58 | Ht 74.0 in | Wt 274.6 lb

## 2016-01-11 DIAGNOSIS — E1165 Type 2 diabetes mellitus with hyperglycemia: Secondary | ICD-10-CM

## 2016-01-11 DIAGNOSIS — E1121 Type 2 diabetes mellitus with diabetic nephropathy: Secondary | ICD-10-CM | POA: Diagnosis not present

## 2016-01-11 DIAGNOSIS — Z794 Long term (current) use of insulin: Secondary | ICD-10-CM | POA: Diagnosis not present

## 2016-01-11 DIAGNOSIS — IMO0002 Reserved for concepts with insufficient information to code with codable children: Secondary | ICD-10-CM

## 2016-01-11 NOTE — Progress Notes (Signed)
Patient ID: Harry [name], male   DOB: [birth year], 60 y.o.   MRN: [id number]   HPI: Harry [name] is a 60 y.o.-year-old male,  management of DM2, dx in early 1990s, insulin-dependent since 1 year after dx, uncontrolled, with complications (CKD stage 2, ED). Last visit 2 mo ago. He saw Dr. Electa Sniff before   Last hemoglobin A1c was: Lab Results  Component Value Date   HGBA1C 7.1 11/12/2015   HGBA1C 7.7 (H) 08/08/2015   HGBA1C 8.1 (H) 04/18/2015   Pt was on a regimen of: - Metformin 500 mg 3x a day, with meals - Lantus 35 units 2x a day. - Humalog   - before b'fast: 10-25 units - before lunch: 15 units (almost never)  - before dinner: never  - before bedtime: 25-40 units (!) - if if <200: takes ~20 units; if >300: 35-40 units. He was on Metformin 500 mg 3x a day, with meals >> stopped.  Now on:  Metformin1000 mg 2x a day with lunch and dinner.  Lantus 35 units 2x a day.  Humalog: - 12 units before a smaller meal - 15 units before a regular meal - 20 units before a larger meal or if you have dessert.  Pt checks his sugars 2x a day and they are: - am: 47, 72-265 >> 68, 91-181, 232, 254 - 2h after b'fast: 180-313 >> n/c - before lunch: n/c  - 2h after lunch: n/c - before dinner: 268 >> n/c - 2h after dinner: 117, 169-440 >>> n/c - bedtime: 122-423 >> 90, 122-338 - nighttime: n/c + rarely lows. Lowest sugar was 39 (at night; after exercise) >> 30s at night (took the Humalog dose for dinner, then 10 units at bedtime for a CBG 280s!!!!) ; he has hypoglycemia awareness at 60.  Highest sugar was in the 400 >> 400 - in [place].   Glucometer: one touch ultra 2  Pt's meals are: - Breakfast: Glucerna, banana - Lunch: eats out - salad with chicken; chicken burrito; sandwich; chick-fil-a - Dinner: meat + veggie + starch - Snacks: potato chips, fruit, icecream No sodas.  - no CKD, last BUN/creatinine:  Lab Results  Component Value Date   BUN 19 11/26/2015   BUN 12 08/08/2015    CREATININE 1.17 11/26/2015   CREATININE 1.00 08/08/2015  On Losartan. - last set of lipids: Lab Results  Component Value Date   CHOL 165 11/26/2015   HDL 48 11/26/2015   LDLCALC 70 11/26/2015   TRIG 235 (H) 11/26/2015   CHOLHDL 3.4 11/26/2015  On Lipitor.  - last eye exam was 06/2013 - Dr. Gershon Crane. No DR. Scheduled for this mo. - no numbness and tingling in his feet.  Pt has no FH of DM.  He also has a history of HTN, HL, GERD.  ROS: Constitutional: no weight gain/loss, + fatigue, no subjective hyperthermia/hypothermia Eyes: no blurry vision, no xerophthalmia ENT: + sore throat, no nodules palpated in throat, no dysphagia/odynophagia, no hoarseness Cardiovascular: no CP/SOB/palpitations/leg swelling Respiratory: + cough/no SOB Gastrointestinal: no N/V/D/C/+ heartburn Musculoskeletal: no muscle/joint aches Skin: no rashes Neurological: no tremors/numbness/tingling/dizziness  I reviewed pt's medications, allergies, PMH, social hx, family hx, and changes were documented in the history of present illness. Otherwise, unchanged from my initial visit note.   Past Medical History:  Diagnosis Date  . CKD (chronic kidney disease) stage 2, GFR 60-89 ml/min    secondary to DM  . Diabetes mellitus without complication ([place]) A999333   insulin requiring  . GERD (  gastroesophageal reflux disease)   . Hyperlipidemia   . Hypertension   . Hypokalemia   . Hypomagnesemia   . OSA on CPAP    10-12 years ago  . Vitamin D deficiency    Past Surgical History:  Procedure Laterality Date  . ANAL FISSURE REPAIR  2007  . VENTRAL HERNIA REPAIR  04/13/12   Dr. Johney Maine, ventral hernia repair w/ mesh   Social History   Social History  . Marital status: Married    Spouse name: N/A  . Number of children: 2   Occupational History  . estimator   Social History Main Topics  . Smoking status: Never Smoker  . Smokeless tobacco: Not on file  . Alcohol use 0.0 oz/week    2 - 5 Cans of beer per week    Current Outpatient Prescriptions on File Prior to Visit  Medication Sig Dispense Refill  . aspirin 81 MG tablet Take 81 mg by mouth daily.    Marland Kitchen atorvastatin (LIPITOR) 40 MG tablet Take 1 tablet (40 mg total) by mouth daily. for cholesterol 90 tablet 1  . BD INSULIN SYRINGE ULTRAFINE 31G X 5/16" 0.5 ML MISC USE TWICE A DAY AS DIRECTED 200 each 49  . bisoprolol-hydrochlorothiazide (ZIAC) 5-6.25 MG tablet TAKE 1 TABLET DAILY FOR BLOOD PRESSURE 90 tablet 1  . glucose 4 GM chewable tablet Chew 1 tablet (4 g total) by mouth as needed for low blood sugar. 30 tablet 12  . insulin lispro (HUMALOG) 100 UNIT/ML injection INJECT 60 TO 80 UNITS UNDER THE SKIN PER DAY WITH MEALS DEPENDING ON MEALS 210 mL 4  . LANTUS 100 UNIT/ML injection INJECT 60 UNITS UNDER THE SKIN AT BEDTIME 60 mL 4  . losartan-hydrochlorothiazide (HYZAAR) 100-12.5 MG tablet TAKE 1 TABLET DAILY 90 tablet 1  . metFORMIN (GLUCOPHAGE) 500 MG tablet Take 2 tablets (1,000 mg total) by mouth 2 (two) times daily with a meal. 360 tablet 3  . omeprazole (PRILOSEC) 20 MG capsule TAKE 1 CAPSULE DAILY FOR ACID REFLUX 90 capsule 1  . ONE TOUCH ULTRA TEST test strip USE TO TAKE GLUCOSE THREE TO FOUR TIMES DAILY FOR FLUCTUATING BLOOD SUGARS 400 each 3  . potassium chloride SA (K-DUR,KLOR-CON) 20 MEQ tablet TAKE 1 TABLET TWICE A DAY 180 tablet 1  . tadalafil (CIALIS) 20 MG tablet Take 1 tablet (20 mg total) by mouth daily as needed for erectile dysfunction. 60 tablet 3   No current facility-administered medications on file prior to visit.    Allergies  Allergen Reactions  . Crestor [Rosuvastatin]    Family History  Problem Relation Age of Onset  . Cancer Father     kidney    PE: BP 136/78 (BP Location: Left Arm, Patient Position: Sitting, Cuff Size: Normal)   Pulse (!) 58   Ht 6\' 2"  (1.88 m)   Wt 274 lb 9.6 oz (124.6 kg)   SpO2 96%   BMI 35.26 kg/m  Wt Readings from Last 3 Encounters:  01/11/16 274 lb 9.6 oz (124.6 kg)  11/26/15 269  lb (122 kg)  11/12/15 269 lb 3.2 oz (122.1 kg)   Constitutional: overweight, in NAD Eyes: PERRLA, EOMI, no exophthalmos ENT: moist mucous membranes, no thyromegaly, no cervical lymphadenopathy Cardiovascular: RRR, + 1 SEM, noRG Respiratory: CTA B Gastrointestinal: abdomen soft, NT, ND, BS+ Musculoskeletal: no deformities, strength intact in all 4 Skin: moist, warm, no rashes Neurological: no tremor with outstretched hands, DTR normal in all 4  ASSESSMENT: 1. DM2, insulin-dependent, uncontrolled, with complications -  CKD stage 2 - ED  - at last visit >> he told me he would be interested in insulin pump >> we can check a C-peptide to see if his insulin deficient, but I highly suspect that he does have insulin deficiency.  - I suggested to start a consistent exercise regimen. He has an elliptical machine at home, which he is thinking about restarting using.  PLAN:  1. Patient with long-standing, uncontrolled diabetes, on oral antidiabetic regimen + basal-bolus insulin regimen, which we adjusted at last visit. Last HbA1c was 7.1%. - He is doing well on Metformin and the higher Humalog doses, but sugars still high at night and he still takes a large correction at bedtime despite advise at last visit not to do so. He had a CBG in the 30s since last visit after taking such a dose! - will increase his Humalog for now  - I advised him to  to:  Patient Instructions  Please continue:  Metformin 1000 mg 2x a day with lunch and dinner.  Lantus 35 units 2x a day.  Please increase:  Humalog: - 10-15 units before a smaller meal - 20-25 units before a regular meal - 30-35 units before a larger meal or if you have dessert.  Do not take any Humalog at bedtime unless sugars > 300 (+ 5 units), or > 400 (+ 10 units).  Please return in 2 months with your sugar log.   - Continue  checking sugars at different times of the day - check 3 times a day, rotating checks - advised for yearly eye exams  >>  he scheduled another appointment with Dr. Gershon Crane.  - Return to clinic in 2 mo with sugar log   [name], MD PhD [facility]

## 2016-01-11 NOTE — Patient Instructions (Addendum)
Please continue:  Metformin 1000 mg 2x a day with lunch and dinner.  Lantus 35 units 2x a day.  Please increase:  Humalog: - 10-15 units before a smaller meal - 20-25 units before a regular meal - 30-35 units before a larger meal or if you have dessert.  Do not take any Humalog at bedtime unless sugars > 300 (+ 5 units), or > 400 (+ 10 units).  Please return in 2 months with your sugar log.

## 2016-01-27 ENCOUNTER — Other Ambulatory Visit: Payer: Self-pay | Admitting: Internal Medicine

## 2016-02-13 ENCOUNTER — Other Ambulatory Visit: Payer: Self-pay | Admitting: Internal Medicine

## 2016-02-18 LAB — HM DIABETES EYE EXAM

## 2016-03-13 ENCOUNTER — Encounter: Payer: Self-pay | Admitting: Internal Medicine

## 2016-03-13 ENCOUNTER — Ambulatory Visit (INDEPENDENT_AMBULATORY_CARE_PROVIDER_SITE_OTHER): Payer: 59 | Admitting: Internal Medicine

## 2016-03-13 VITALS — BP 140/90 | HR 71 | Ht 73.5 in | Wt 272.0 lb

## 2016-03-13 DIAGNOSIS — E1121 Type 2 diabetes mellitus with diabetic nephropathy: Secondary | ICD-10-CM

## 2016-03-13 DIAGNOSIS — E1165 Type 2 diabetes mellitus with hyperglycemia: Secondary | ICD-10-CM

## 2016-03-13 DIAGNOSIS — Z794 Long term (current) use of insulin: Secondary | ICD-10-CM

## 2016-03-13 DIAGNOSIS — IMO0002 Reserved for concepts with insufficient information to code with codable children: Secondary | ICD-10-CM

## 2016-03-13 LAB — POCT GLYCOSYLATED HEMOGLOBIN (HGB A1C): Hemoglobin A1C: 6.8

## 2016-03-13 MED ORDER — INSULIN GLARGINE 100 UNIT/ML ~~LOC~~ SOLN: SUBCUTANEOUS | 4 refills | Status: DC

## 2016-03-13 MED ORDER — DULAGLUTIDE 0.75 MG/0.5ML ~~LOC~~ SOAJ: SUBCUTANEOUS | 1 refills | Status: DC

## 2016-03-13 NOTE — Progress Notes (Signed)
Patient ID: Harry [name], male   DOB: [birth year], 61 y.o.   MRN: [id number]   HPI: Harry [name] is a 61 y.o.-year-old male,  management of DM2, dx in early 1990s, insulin-dependent since 1 year after dx, uncontrolled, with complications (CKD stage 2, ED). Last visit 2 mo ago. He saw Dr. Electa Sniff before   Last hemoglobin A1c was: Lab Results  Component Value Date   HGBA1C 7.1 11/12/2015   HGBA1C 7.7 (H) 08/08/2015   HGBA1C 8.1 (H) 04/18/2015   Now on:  Metformin 1000 mg 2x a day with lunch and dinner. - skips 3/7 days !  Lantus 35 units 2x a day.  Humalog: - 10-15 units before a smaller meal - 20-25 units before a regular meal - 30-35 units before a larger meal or if you have dessert. Do not take any Humalog at bedtime unless sugars > 300 (+ 5 units), or > 400 (+ 10 units).  Pt checks his sugars 2x a day and they are: - am: 47, 72-265 >> 68, 91-181, 232, 254 (B: 15-20 units) >> 68-255, 280 - 2h after b'fast: 180-313 >> n/c - before lunch: n/c  (L: 25 units) >> 164-325 - 2h after lunch: n/c >> 354, 362 - before dinner: 268 >> n/c (D: 25 units)  - 2h after dinner: 117, 169-440 >>> n/c  - bedtime: 122-423 >> 90, 122-338 >> 61-401 (higher if misses lunch Humalog) - nighttime: n/c + rarely lows. Lowest sugar was 39 (at night; after exercise) >> 30s at night (took the Humalog dose for dinner, then 10 units at bedtime for a CBG 280s!!!!) >> 52; he has hypoglycemia awareness at 60-70.  Highest sugar was in the 400 >> 400 - in [place]>> 401   Glucometer: one touch ultra 2  Pt's meals are: - Breakfast: Glucerna, banana - Lunch: eats out - salad with chicken; chicken burrito; sandwich; [employer] - Dinner: meat + veggie + starch - Snacks: potato chips, fruit, icecream No sodas.  - no CKD, last BUN/creatinine:  Lab Results  Component Value Date   BUN 19 11/26/2015   BUN 12 08/08/2015   CREATININE 1.17 11/26/2015   CREATININE 1.00 08/08/2015  On Losartan. - last set of  lipids: Lab Results  Component Value Date   CHOL 165 11/26/2015   HDL 48 11/26/2015   LDLCALC 70 11/26/2015   TRIG 235 (H) 11/26/2015   CHOLHDL 3.4 11/26/2015  On Lipitor.  - last eye exam was 02/2016 - Dr. Gershon Crane. No DR. - no numbness and tingling in his feet.  He also has a history of HTN, HL, GERD.  ROS: Constitutional: no weight gain/loss, no fatigue, no subjective hyperthermia/hypothermia Eyes: no blurry vision, no xerophthalmia ENT: no sore throat, no nodules palpated in throat, no dysphagia/odynophagia, no hoarseness Cardiovascular: no CP/SOB/palpitations/leg swelling Respiratory: no cough/no SOB Gastrointestinal: no N/V/D/C/heartburn Musculoskeletal: no muscle/joint aches Skin: no rashes Neurological: no tremors/numbness/tingling/dizziness  I reviewed pt's medications, allergies, PMH, social hx, family hx, and changes were documented in the history of present illness. Otherwise, unchanged from my initial visit note.   Past Medical History:  Diagnosis Date  . CKD (chronic kidney disease) stage 2, GFR 60-89 ml/min    secondary to DM  . Diabetes mellitus without complication ([place]) A999333   insulin requiring  . GERD (gastroesophageal reflux disease)   . Hyperlipidemia   . Hypertension   . Hypokalemia   . Hypomagnesemia   . OSA on CPAP    10-12 years ago  .  Vitamin D deficiency    Past Surgical History:  Procedure Laterality Date  . ANAL FISSURE REPAIR  2007  . VENTRAL HERNIA REPAIR  04/13/12   Dr. Johney Maine, ventral hernia repair w/ mesh   Social History   Social History  . Marital status: Married    Spouse name: N/A  . Number of children: 2   Occupational History  . estimator   Social History Main Topics  . Smoking status: Never Smoker  . Smokeless tobacco: Not on file  . Alcohol use 0.0 oz/week    2 - 5 Cans of beer per week   Current Outpatient Prescriptions on File Prior to Visit  Medication Sig Dispense Refill  . aspirin 81 MG tablet Take 81 mg by  mouth daily.    Marland Kitchen atorvastatin (LIPITOR) 40 MG tablet Take 1 tablet (40 mg total) by mouth daily. for cholesterol 90 tablet 1  . BD INSULIN SYRINGE ULTRAFINE 31G X 5/16" 0.5 ML MISC USE TWICE A DAY AS DIRECTED 200 each 49  . bisoprolol-hydrochlorothiazide (ZIAC) 5-6.25 MG tablet TAKE 1 TABLET DAILY FOR BLOOD PRESSURE 90 tablet 1  . FLUCELVAX QUADRIVALENT 0.5 ML SUSY inject 0.5 milliliter intramuscularly  0  . glucose 4 GM chewable tablet Chew 1 tablet (4 g total) by mouth as needed for low blood sugar. 30 tablet 12  . insulin lispro (HUMALOG) 100 UNIT/ML injection INJECT 60 TO 80 UNITS UNDER THE SKIN PER DAY WITH MEALS DEPENDING ON MEALS 210 mL 4  . LANTUS 100 UNIT/ML injection INJECT 60 UNITS UNDER THE SKIN AT BEDTIME 60 mL 4  . losartan-hydrochlorothiazide (HYZAAR) 100-12.5 MG tablet TAKE 1 TABLET DAILY 90 tablet 1  . metFORMIN (GLUCOPHAGE) 500 MG tablet Take 2 tablets (1,000 mg total) by mouth 2 (two) times daily with a meal. 360 tablet 3  . omeprazole (PRILOSEC) 20 MG capsule TAKE 1 CAPSULE DAILY FOR ACID REFLUX 90 capsule 1  . ONE TOUCH ULTRA TEST test strip USE 3 TO 4 TIMES DAILY FOR FLUCTUATING BLOOD SUGARS 400 each 3  . potassium chloride SA (K-DUR,KLOR-CON) 20 MEQ tablet TAKE 1 TABLET TWICE A DAY 180 tablet 1  . tadalafil (CIALIS) 20 MG tablet Take 1 tablet (20 mg total) by mouth daily as needed for erectile dysfunction. 60 tablet 3   No current facility-administered medications on file prior to visit.    Allergies  Allergen Reactions  . Crestor [Rosuvastatin]    Family History  Problem Relation Age of Onset  . Cancer Father     kidney    PE: BP 140/90 (BP Location: Left Arm, Patient Position: Sitting)   Pulse 71   Ht 6' 1.5" (1.867 m)   Wt 272 lb (123.4 kg)   SpO2 98%   BMI 35.40 kg/m  Wt Readings from Last 3 Encounters:  03/13/16 272 lb (123.4 kg)  01/11/16 274 lb 9.6 oz (124.6 kg)  11/26/15 269 lb (122 kg)   Constitutional: overweight, in NAD Eyes: PERRLA, EOMI,  no exophthalmos ENT: moist mucous membranes, no thyromegaly, no cervical lymphadenopathy Cardiovascular: RRR, + 1 SEM, noRG Respiratory: CTA B Gastrointestinal: abdomen soft, NT, ND, BS+ Musculoskeletal: no deformities, strength intact in all 4 Skin: moist, warm, no rashes Neurological: no tremor with outstretched hands, DTR normal in all 4  ASSESSMENT: 1. DM2, insulin-dependent, uncontrolled, with complications - CKD stage 2 - ED  - at last visit >> he told me he would be interested in insulin pump >> we can check a C-peptide to see if  his insulin deficient, but I highly suspect that he does have insulin deficiency.  - I suggested to start a consistent exercise regimen. He has an elliptical machine at home, which he is thinking about restarting using.  PLAN:  1. Patient with long-standing, uncontrolled diabetes, on oral antidiabetic regimen + basal-bolus insulin regimen, which we adjusted at last visits. Last HbA1c was 7.1%. Today: better, at 6.8%, but sugars are high erratically >> will start Trulicity. I demonstrated pen use. - will continue Lantus and Humalog doses for now; advised compliance with Metformin - I advised him to  to:  Patient Instructions  Please continue:  Metformin 1000 mg 2x a day with lunch and dinner.  Lantus 35 units 2x a day.  Humalog: - 10-15 units before a smaller meal - 20-25 units before a regular meal - 30-35 units before a larger meal or if you have dessert. Do not take any Humalog at bedtime unless sugars > 300 (+ 5 units), or > 400 (+ 10 units).  Please start Trulicity [id number] mg once a week in am. Let me know in 3 weeks if we need to increase.   Please return in 3 months with your sugar log.   - Continue  checking sugars at different times of the day - check 3 times a day, rotating checks - advised for yearly eye exams >>  UTD - Return to clinic in 3 mo with sugar log   Harry Kingdom, MD PhD [facility] Endocr[facility]

## 2016-03-13 NOTE — Patient Instructions (Addendum)
Please continue:  Metformin 1000 mg 2x a day with lunch and dinner.  Lantus 35 units 2x a day.  Humalog: - 10-15 units before a smaller meal - 20-25 units before a regular meal - 30-35 units before a larger meal or if you have dessert. Do not take any Humalog at bedtime unless sugars > 300 (+ 5 units), or > 400 (+ 10 units).  Please start Trulicity [id number] mg once a week in am. Let me know in 3 weeks if we need to increase.   Please return in 3 months with your sugar log.

## 2016-04-17 ENCOUNTER — Encounter: Payer: Self-pay | Admitting: Physician Assistant

## 2016-04-22 ENCOUNTER — Other Ambulatory Visit: Payer: Self-pay | Admitting: Internal Medicine

## 2016-04-22 MED ORDER — OSELTAMIVIR PHOSPHATE 75 MG PO CAPS: 75.0000 mg | ORAL_CAPSULE | Freq: Two times a day (BID) | ORAL | 0 refills | Status: AC

## 2016-04-29 ENCOUNTER — Other Ambulatory Visit: Payer: Self-pay

## 2016-04-29 MED ORDER — DULAGLUTIDE 0.75 MG/0.5ML ~~LOC~~ SOAJ: SUBCUTANEOUS | 1 refills | Status: DC

## 2016-04-30 ENCOUNTER — Other Ambulatory Visit: Payer: Self-pay

## 2016-04-30 MED ORDER — DULAGLUTIDE 0.75 MG/0.5ML ~~LOC~~ SOAJ: SUBCUTANEOUS | 1 refills | Status: DC

## 2016-05-08 ENCOUNTER — Encounter: Payer: Self-pay | Admitting: Physician Assistant

## 2016-05-08 NOTE — Progress Notes (Deleted)
Complete Physical  Assessment and Plan: Essential hypertension - continue medications, DASH diet, exercise and monitor at home. Call if greater than 130/80.  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 - Hemoglobin A1c - Insulin, fasting - HM DIABETES FOOT EXAM  CKD (chronic kidney disease) stage 2, GFR 60-89 ml/min Increase fluids, avoid NSAIDS, monitor sugars, will monitor - BASIC METABOLIC PANEL WITH GFR  Obesity (BMI 33) Obesity with co morbidities- long discussion about weight loss, diet, and exercise - Testosterone   Hyperlipidemia -continue medications, check lipids, decrease fatty foods, increase activity.  - Lipid panel  Vitamin D deficiency - Vit D  25 hydroxy (rtn osteoporosis monitoring)  Prostate cancer screening - PSA   Gastroesophageal reflux disease with esophagitis Continue PPI/H2 blocker, diet discussed   Hypoventilation syndrome Sleep apnea- continue CPAP, weight loss advised.    Ventral hernia without obstruction or gangrene monitor  BPH (benign prostatic hypertrophy) cialis 20 mg prescribed, normal DRE, check urine   Erectile dysfunction associated with type 2 diabetes mellitus Cialis PRN  Hypokalemia Check potassium - Magnesium   Ascending aorta aneurysm Control blood pressure, cholesterol, glucose, increase exercise.  Continue cardio follow up  Discussed med's effects and SE's. Screening labs and tests as requested with regular follow-up as recommended.  HPI Patient presents for a complete physical.   His blood pressure has not been checked at home, today their BP is    He does not workout. He denies chest pain, shortness of breath, dizziness, shortness breath.  Had normal stress test and echo with Dr. 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   He is on cholesterol medication, lipitor 40mg  and denies myalgias. His cholesterol is at goal. The cholesterol last visit was:   Lab Results  Component Value Date   CHOL 165 11/26/2015   HDL 48 11/26/2015   LDLCALC 70 11/26/2015   TRIG 235 (H) 11/26/2015   CHOLHDL 3.4 11/26/2015   He has been working on diet and exercise for insulin dependent diabetes with CKD stage 2, he has had DM x 30 years, he is on bASA, he is on ACE/ARB, he is checking his sugars at night, he is lantus and humulog TID but admits to forgetting lunch time dose, and denies paresthesia of the feet, polydipsia, polyuria and visual disturbances. Last A1C in the office was:  Lab Results  Component Value Date   HGBA1C 6.8 03/13/2016  Patient is on Vitamin D supplement.   Lab Results  Component Value Date   VD25OH 44 08/08/2015    Last PSA was: Lab Results  Component Value Date   PSA 2.64 04/18/2015  He has BPH, but is not on anything for this but states the last few months it has gotten worse.   He has ED,  and he is on cialis PRN 20mg .  He has GERD, is on prilosec.  Has history of hypokalemia, he is on Kdur 20 BID. Lab Results  Component Value Date   CREATININE 1.17 11/26/2015   BUN 19 11/26/2015   NA 138 11/26/2015   K 3.5 11/26/2015   CL 100 11/26/2015   CO2 30 11/26/2015   BMI is There is no height or weight on  file to calculate BMI., he is working on diet and exercise. Due to his obesity he has OSA and is on CPAP.  Wt Readings from Last 3 Encounters:  03/13/16 272 lb (123.4 kg)  01/11/16 274 lb 9.6 oz (124.6 kg)  11/26/15 269 lb (122 kg)     Current Medications:  Current Outpatient Prescriptions on File Prior to Visit  Medication Sig Dispense Refill  . aspirin 81 MG tablet Take 81 mg by mouth  daily.    Marland Kitchen atorvastatin (LIPITOR) 40 MG tablet Take 1 tablet (40 mg total) by mouth daily. for cholesterol 90 tablet 1  . BD INSULIN SYRINGE ULTRAFINE 31G X 5/16" 0.5 ML MISC USE TWICE A DAY AS DIRECTED 200 each 49  . bisoprolol-hydrochlorothiazide (ZIAC) 5-6.25 MG tablet TAKE 1 TABLET DAILY FOR BLOOD PRESSURE 90 tablet 1  . Dulaglutide (TRULICITY) 2.02 RK/2.7CW SOPN Inject 0.75 units under skin in am weekly 4 pen 1  . FLUCELVAX QUADRIVALENT 0.5 ML SUSY inject 0.5 milliliter intramuscularly  0  . glucose 4 GM chewable tablet Chew 1 tablet (4 g total) by mouth as needed for low blood sugar. 30 tablet 12  . insulin glargine (LANTUS) 100 UNIT/ML injection INJECT 35 UNITS UNDER THE SKIN 2x a day 60 mL 4  . insulin lispro (HUMALOG) 100 UNIT/ML injection INJECT 60 TO 80 UNITS UNDER THE SKIN PER DAY WITH MEALS DEPENDING ON MEALS 210 mL 4  . losartan-hydrochlorothiazide (HYZAAR) 100-12.5 MG tablet TAKE 1 TABLET DAILY 90 tablet 1  . metFORMIN (GLUCOPHAGE) 500 MG tablet Take 2 tablets (1,000 mg total) by mouth 2 (two) times daily with a meal. 360 tablet 3  . omeprazole (PRILOSEC) 20 MG capsule TAKE 1 CAPSULE DAILY FOR ACID REFLUX 90 capsule 1  . ONE TOUCH ULTRA TEST test strip USE 3 TO 4 TIMES DAILY FOR FLUCTUATING BLOOD SUGARS 400 each 3  . potassium chloride SA (K-DUR,KLOR-CON) 20 MEQ tablet TAKE 1 TABLET TWICE A DAY 180 tablet 1  . tadalafil (CIALIS) 20 MG tablet Take 1 tablet (20 mg total) by mouth daily as needed for erectile dysfunction. 60 tablet 3   No current facility-administered medications on file prior to visit.    Health Maintenance:  Immunization History  Administered Date(s) Administered  . Influenza Split 11/28/2013, 12/06/2014  . Influenza-Unspecified 12/19/2011, 01/04/2013, 01/01/2016  . Pneumococcal Polysaccharide-23 03/03/2006  . Td 03/03/2008  . Zoster 02/07/2011   Tetanus: 2010 Pneumovax: 2008 Prevnar 13: N/A Flu vaccine: 12/2014 Zostavax: 2012 DEXA: N/A Colonoscopy:  12/2013 (Dr. Cristina Gong) EGD: N/A Eye Exam: Dr. Gershon Crane q yearly Dentist: Dr. Ronita Hipps, [place]  Patient Care Team: Unk Pinto, MD as PCP - General (Internal Medicine) Rutherford Guys, MD as Consulting Physician (Ophthalmology) Ronald Lobo, MD as Consulting Physician (Gastroenterology) Larey Dresser, MD as Consulting Physician (Cardiology)  Medical History:  Past Medical History:  Diagnosis Date  . CKD (chronic kidney disease) stage 2, GFR 60-89 ml/min    secondary to DM  . Diabetes mellitus without complication ([place]) 2376   insulin requiring  . GERD (gastroesophageal reflux disease)   . Hyperlipidemia   . Hypertension   . Hypokalemia   . Hypomagnesemia   . OSA on CPAP    10-12 years ago  . Vitamin D deficiency    Allergies Allergies  Allergen Reactions  . Crestor [Rosuvastatin]     SURGICAL HISTORY He  has a past surgical history that includes Anal fissure repair (2007) and Ventral hernia repair (04/13/12). FAMILY HISTORY His family history includes Cancer  in his father. SOCIAL HISTORY He  reports that he has never smoked. He has never used smokeless tobacco. He reports that he drinks alcohol. He reports that he does not use drugs.  Review of Systems:  Review of Systems  Constitutional: Negative.   HENT: Negative.   Eyes: Negative.   Respiratory: Positive for shortness of breath (with exertion). Negative for cough, hemoptysis, sputum production and wheezing.   Cardiovascular: Negative.   Gastrointestinal: Negative.  Heartburn: better with PPI.  Genitourinary: Negative.   Musculoskeletal: Negative.   Skin: Negative.   Neurological: Negative.   Endo/Heme/Allergies: Negative.   Psychiatric/Behavioral: Negative.     Physical Exam: Estimated body mass index is 35.4 kg/m as calculated from the following:   Height as of 03/13/16: 6' 1.5" (1.867 m).   Weight as of 03/13/16: 272 lb (123.4 kg). There were no vitals taken for this visit. General Appearance: Well  nourished, in no apparent distress.  Eyes: PERRLA, EOMs, conjunctiva no swelling or erythema, normal fundi and vessels.  Sinuses: No Frontal/maxillary tenderness  ENT/Mouth: Ext aud canals clear, normal light reflex with TMs without erythema, bulging. Good dentition. No erythema, swelling, or exudate on post pharynx. Tonsils not swollen or erythematous. Hearing normal.  Neck: Supple, thyroid normal. No bruits  Respiratory: Respiratory effort normal, BS equal bilaterally without rales, rhonchi, wheezing or stridor.  Cardio: RRR without murmurs, rubs or gallops, decreased HS due to body habitus. Brisk peripheral pulses without edema.  Chest: symmetric, with normal excursions and percussion.  Abdomen: Soft, nontender, obese, + ventral hernia, no guarding, rebound, masses, or organomegaly. .  Lymphatics: Non tender without lymphadenopathy.  Genitourinary: prostate smooth, no masses, nontender, neg hemoccult Musculoskeletal: Full ROM all peripheral extremities,5/5 strength, and normal gait.  Skin: Warm, dry without rashes, lesions, ecchymosis. Neuro: Cranial nerves intact, reflexes equal bilaterally. Normal muscle tone, no cerebellar symptoms. Sensation intact.  Psych: Awake and oriented X 3, normal affect, Insight and Judgment appropriate.   EKG: defer cardio AORTA SCAN: defer cardio  Vicie Mutters 7:40 AM [facility]

## 2016-05-15 ENCOUNTER — Other Ambulatory Visit: Payer: Self-pay

## 2016-05-15 MED ORDER — DULAGLUTIDE 1.5 MG/0.5ML ~~LOC~~ SOAJ: SUBCUTANEOUS | 2 refills | Status: DC

## 2016-05-20 ENCOUNTER — Other Ambulatory Visit: Payer: Self-pay

## 2016-05-20 MED ORDER — DULAGLUTIDE 1.5 MG/0.5ML ~~LOC~~ SOAJ: SUBCUTANEOUS | 0 refills | Status: DC

## 2016-05-21 ENCOUNTER — Other Ambulatory Visit: Payer: Self-pay

## 2016-05-21 MED ORDER — DULAGLUTIDE 1.5 MG/0.5ML ~~LOC~~ SOAJ: SUBCUTANEOUS | 0 refills | Status: DC

## 2016-05-26 ENCOUNTER — Other Ambulatory Visit: Payer: Self-pay | Admitting: Physician Assistant

## 2016-06-02 ENCOUNTER — Ambulatory Visit (INDEPENDENT_AMBULATORY_CARE_PROVIDER_SITE_OTHER): Payer: 59 | Admitting: Physician Assistant

## 2016-06-02 ENCOUNTER — Encounter: Payer: Self-pay | Admitting: Physician Assistant

## 2016-06-02 VITALS — BP 124/80 | HR 70 | Temp 97.3°F | Resp 16 | Ht 74.5 in | Wt 270.0 lb

## 2016-06-02 DIAGNOSIS — E1165 Type 2 diabetes mellitus with hyperglycemia: Secondary | ICD-10-CM

## 2016-06-02 DIAGNOSIS — R351 Nocturia: Secondary | ICD-10-CM

## 2016-06-02 DIAGNOSIS — Z0001 Encounter for general adult medical examination with abnormal findings: Secondary | ICD-10-CM

## 2016-06-02 DIAGNOSIS — I493 Ventricular premature depolarization: Secondary | ICD-10-CM

## 2016-06-02 DIAGNOSIS — Z79899 Other long term (current) drug therapy: Secondary | ICD-10-CM

## 2016-06-02 DIAGNOSIS — K2100 Gastro-esophageal reflux disease with esophagitis, without bleeding: Secondary | ICD-10-CM

## 2016-06-02 DIAGNOSIS — I7121 Aneurysm of the ascending aorta, without rupture: Secondary | ICD-10-CM

## 2016-06-02 DIAGNOSIS — E1169 Type 2 diabetes mellitus with other specified complication: Secondary | ICD-10-CM

## 2016-06-02 DIAGNOSIS — N182 Chronic kidney disease, stage 2 (mild): Secondary | ICD-10-CM

## 2016-06-02 DIAGNOSIS — IMO0002 Reserved for concepts with insufficient information to code with codable children: Secondary | ICD-10-CM

## 2016-06-02 DIAGNOSIS — I712 Thoracic aortic aneurysm, without rupture: Secondary | ICD-10-CM

## 2016-06-02 DIAGNOSIS — E876 Hypokalemia: Secondary | ICD-10-CM

## 2016-06-02 DIAGNOSIS — Z0000 Encounter for general adult medical examination without abnormal findings: Secondary | ICD-10-CM | POA: Diagnosis not present

## 2016-06-02 DIAGNOSIS — N521 Erectile dysfunction due to diseases classified elsewhere: Secondary | ICD-10-CM

## 2016-06-02 DIAGNOSIS — E559 Vitamin D deficiency, unspecified: Secondary | ICD-10-CM

## 2016-06-02 DIAGNOSIS — E662 Morbid (severe) obesity with alveolar hypoventilation: Secondary | ICD-10-CM

## 2016-06-02 DIAGNOSIS — I10 Essential (primary) hypertension: Secondary | ICD-10-CM

## 2016-06-02 DIAGNOSIS — Z794 Long term (current) use of insulin: Secondary | ICD-10-CM

## 2016-06-02 DIAGNOSIS — N401 Enlarged prostate with lower urinary tract symptoms: Secondary | ICD-10-CM

## 2016-06-02 DIAGNOSIS — E785 Hyperlipidemia, unspecified: Secondary | ICD-10-CM

## 2016-06-02 DIAGNOSIS — E1122 Type 2 diabetes mellitus with diabetic chronic kidney disease: Secondary | ICD-10-CM

## 2016-06-02 DIAGNOSIS — E1121 Type 2 diabetes mellitus with diabetic nephropathy: Secondary | ICD-10-CM

## 2016-06-02 LAB — HEPATIC FUNCTION PANEL
ALT: 20 U/L (ref 9–46)
AST: 20 U/L (ref 10–35)
Albumin: 3.7 g/dL (ref 3.6–5.1)
Alkaline Phosphatase: 85 U/L (ref 40–115)
Bilirubin, Direct: 0.2 mg/dL
Indirect Bilirubin: 0.7 mg/dL (ref 0.2–1.2)
Total Bilirubin: 0.9 mg/dL (ref 0.2–1.2)
Total Protein: 6.3 g/dL (ref 6.1–8.1)

## 2016-06-02 LAB — BASIC METABOLIC PANEL WITHOUT GFR
BUN: 15 mg/dL (ref 7–25)
CO2: 30 mmol/L (ref 20–31)
Calcium: 9.4 mg/dL (ref 8.6–10.3)
Chloride: 100 mmol/L (ref 98–110)
Creat: 1.24 mg/dL (ref 0.70–1.25)
GFR, Est African American: 72 mL/min
GFR, Est Non African American: 62 mL/min
Glucose, Bld: 120 mg/dL — ABNORMAL HIGH (ref 65–99)
Potassium: 3.4 mmol/L — ABNORMAL LOW (ref 3.5–5.3)
Sodium: 139 mmol/L (ref 135–146)

## 2016-06-02 LAB — LIPID PANEL
CHOLESTEROL: 134 mg/dL (ref <200)
HDL: 44 mg/dL (ref >40)
LDL Cholesterol: 50 mg/dL (ref <100)
Total CHOL/HDL Ratio: 3 Ratio (ref <5.0)
Triglycerides: 198 mg/dL — ABNORMAL HIGH (ref <150)
VLDL: 40 mg/dL — AB (ref <30)

## 2016-06-02 LAB — CBC WITH DIFFERENTIAL/PLATELET
BASOS PCT: 2 %
Basophils Absolute: 120 cells/uL (ref 0–200)
Eosinophils Absolute: 180 cells/uL (ref 15–500)
Eosinophils Relative: 3 %
HEMATOCRIT: 46.1 % (ref 38.5–50.0)
HEMOGLOBIN: 15.4 g/dL (ref 13.2–17.1)
LYMPHS ABS: 1380 {cells}/uL (ref 850–3900)
Lymphocytes Relative: 23 %
MCH: 28.1 pg (ref 27.0–33.0)
MCHC: 33.4 g/dL (ref 32.0–36.0)
MCV: 84 fL (ref 80.0–100.0)
MONO ABS: 540 {cells}/uL (ref 200–950)
MPV: 10.2 fL (ref 7.5–12.5)
Monocytes Relative: 9 %
Neutro Abs: 3780 cells/uL (ref 1500–7800)
Neutrophils Relative %: 63 %
Platelets: 216 10*3/uL (ref 140–400)
RBC: 5.49 MIL/uL (ref 4.20–5.80)
RDW: 13.9 % (ref 11.0–15.0)
WBC: 6 10*3/uL (ref 3.8–10.8)

## 2016-06-02 NOTE — Progress Notes (Signed)
Complete Physical  Assessment and Plan: Essential hypertension - continue medications, DASH diet, exercise and monitor at home. Call if greater than 130/80.  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 WILL NOT GET A1C, too early, will see Dr. Darnell Level in 2 months - Hemoglobin A1c  CKD (chronic kidney disease) stage 2, GFR 60-89 ml/min Increase fluids, avoid NSAIDS, monitor sugars, will monitor - BASIC METABOLIC PANEL WITH GFR  Obesity (BMI 33) Obesity with co morbidities- long discussion about weight loss, diet, and exercise - Testosterone   Hyperlipidemia -continue medications, check lipids, decrease fatty foods, increase activity.  - Lipid panel  Vitamin D deficiency - Vit D  25 hydroxy (rtn osteoporosis monitoring)  Prostate cancer screening - PSA   Gastroesophageal reflux disease with esophagitis Continue PPI/H2 blocker, diet discussed   Hypoventilation syndrome Sleep apnea- continue CPAP, weight loss advised.    Ventral hernia without obstruction or gangrene monitor  BPH (benign prostatic hypertrophy) cialis 20 mg prescribed, normal DRE, check urine   Erectile dysfunction associated with type 2 diabetes mellitus Cialis PRN  Hypokalemia Check potassium - Magnesium   Ascending aorta aneurysm Control blood pressure, cholesterol, glucose, increase exercise.  Continue cardio follow up  Discussed med's effects and SE's. Screening labs and tests as requested with regular follow-up as recommended.  Future Appointments Date Time Provider [place]  08/04/2016 9:30 AM [name], MD  [facility] None  06/03/2017 3:00 PM Vicie Mutters, PA-C [facility] None     HPI Patient presents for a complete physical.   His blood pressure has not been checked at home, today their BP is BP: 124/80 He does not workout. He denies chest pain, shortness of breath, dizziness, shortness breath.  Had normal stress test and echo with Dr. 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   He is on cholesterol medication, lipitor 40mg  and denies myalgias. His cholesterol is at goal. The cholesterol last visit was:   Lab Results  Component Value Date   CHOL 165 11/26/2015   HDL 48 11/26/2015   LDLCALC 70 11/26/2015   TRIG 235 (H) 11/26/2015   CHOLHDL 3.4 11/26/2015   He has been working on diet and exercise for insulin dependent diabetes with CKD stage 2, he has had DM x 30 years, he is on bASA, he is on ACE/ARB, he is checking his sugars at night, he is following with [name], started on trulicity in Jan, still on 0.75, has not increased dose, lowest sugar is 60, and denies paresthesia of the feet, polydipsia, polyuria and visual disturbances. Last A1C in the office was:  Lab Results  Component Value Date   HGBA1C 6.8 03/13/2016  Patient is on Vitamin D supplement.   Lab Results  Component Value Date   VD25OH 44 08/08/2015    Last PSA was: Lab Results  Component Value Date   PSA 2.64 04/18/2015  He has BPH, but is not on anything for this but states the last few months it has gotten worse.   He has ED,  and he is on cialis PRN 20mg .  He has GERD, is on prilosec.  Has history of hypokalemia, he is on Kdur 20 BID. Lab Results  Component  Value Date   CREATININE 1.17 11/26/2015   BUN 19 11/26/2015   NA 138 11/26/2015   K 3.5 11/26/2015   CL 100 11/26/2015   CO2 30 11/26/2015   BMI is Body mass index is 34.2 kg/m., he is working on diet and exercise. Due to his obesity he has OSA and is on CPAP.  Wt Readings from Last 3 Encounters:  06/02/16 270 lb  (122.5 kg)  03/13/16 272 lb (123.4 kg)  01/11/16 274 lb 9.6 oz (124.6 kg)     Current Medications:  Current Outpatient Prescriptions on File Prior to Visit  Medication Sig Dispense Refill  . aspirin 81 MG tablet Take 81 mg by mouth daily.    Marland Kitchen atorvastatin (LIPITOR) 40 MG tablet Take 1 tablet (40 mg total) by mouth daily. for cholesterol 90 tablet 1  . BD INSULIN SYRINGE ULTRAFINE 31G X 5/16" 0.5 ML MISC USE TWICE A DAY AS DIRECTED 200 each 49  . bisoprolol-hydrochlorothiazide (ZIAC) 5-6.25 MG tablet TAKE 1 TABLET DAILY FOR BLOOD PRESSURE 90 tablet 1  . Dulaglutide (TRULICITY) 1.5 HD/6.2IW SOPN Inject 1.5 mg weekly. 12 pen 0  . FLUCELVAX QUADRIVALENT 0.5 ML SUSY inject 0.5 milliliter intramuscularly  0  . glucose 4 GM chewable tablet Chew 1 tablet (4 g total) by mouth as needed for low blood sugar. 30 tablet 12  . insulin glargine (LANTUS) 100 UNIT/ML injection INJECT 35 UNITS UNDER THE SKIN 2x a day 60 mL 4  . insulin lispro (HUMALOG) 100 UNIT/ML injection INJECT 60 TO 80 UNITS UNDER THE SKIN PER DAY WITH MEALS DEPENDING ON MEALS 210 mL 4  . losartan-hydrochlorothiazide (HYZAAR) 100-12.5 MG tablet TAKE 1 TABLET DAILY 90 tablet 1  . metFORMIN (GLUCOPHAGE) 500 MG tablet Take 2 tablets (1,000 mg total) by mouth 2 (two) times daily with a meal. 360 tablet 3  . omeprazole (PRILOSEC) 20 MG capsule TAKE 1 CAPSULE DAILY FOR ACID REFLUX 90 capsule 1  . ONE TOUCH ULTRA TEST test strip USE 3 TO 4 TIMES DAILY FOR FLUCTUATING BLOOD SUGARS 400 each 3  . potassium chloride SA (K-DUR,KLOR-CON) 20 MEQ tablet TAKE 1 TABLET TWICE A DAY 180 tablet 0  . tadalafil (CIALIS) 20 MG tablet Take 1 tablet (20 mg total) by mouth daily as needed for erectile dysfunction. 60 tablet 3   No current facility-administered medications on file prior to visit.    Health Maintenance:  Immunization History  Administered Date(s) Administered  . Influenza Split 11/28/2013, 12/06/2014  . Influenza-Unspecified 12/19/2011,  01/04/2013, 01/01/2016  . Pneumococcal Polysaccharide-23 03/03/2006  . Td 03/03/2008  . Zoster 02/07/2011   Tetanus: 2010 Pneumovax: 2008 Prevnar 13: N/A Flu vaccine: 2017 Zostavax: 2012 DEXA: N/A Colonoscopy: 12/2013 (Dr. Cristina Gong) Echo 2016 Stress test 2016 EGD: N/A Eye Exam: Dr. Gershon Crane q yearly Dentist: Dr. Ronita Hipps, [place]  Patient Care Team: Unk Pinto, MD as PCP - General (Internal Medicine) Rutherford Guys, MD as Consulting Physician (Ophthalmology) Ronald Lobo, MD as Consulting Physician (Gastroenterology) Larey Dresser, MD as Consulting Physician (Cardiology)  Medical History:  Past Medical History:  Diagnosis Date  . CKD (chronic kidney disease) stage 2, GFR 60-89 ml/min    secondary to DM  . Diabetes mellitus without complication ([place]) 9798   insulin requiring  . GERD (gastroesophageal reflux disease)   . Hyperlipidemia   . Hypertension   . Hypokalemia   . Hypomagnesemia   . OSA on CPAP    10-12 years ago  . Vitamin D deficiency    Allergies  Allergies  Allergen Reactions  . Crestor [Rosuvastatin]     SURGICAL HISTORY He  has a past surgical history that includes Anal fissure repair (2007) and Ventral hernia repair (04/13/12). FAMILY HISTORY His family history includes Cancer in his father. SOCIAL HISTORY He  reports that he has never smoked. He has never used smokeless tobacco. He reports that he drinks alcohol. He reports that he does not use drugs.  Review of Systems:  Review of Systems  Constitutional: Negative.   HENT: Negative.   Eyes: Negative.   Respiratory: Positive for shortness of breath (with exertion). Negative for cough, hemoptysis, sputum production and wheezing.   Cardiovascular: Negative.   Gastrointestinal: Negative.  Heartburn: better with PPI.  Genitourinary: Negative.   Musculoskeletal: Negative.   Skin: Negative.   Neurological: Negative.   Endo/Heme/Allergies: Negative.   Psychiatric/Behavioral: Negative.      Physical Exam: Estimated body mass index is 34.2 kg/m as calculated from the following:   Height as of this encounter: 6' 2.5" (1.892 m).   Weight as of this encounter: 270 lb (122.5 kg). BP 124/80   Pulse 70   Temp 97.3 F (36.3 C)   Resp 16   Ht 6' 2.5" (1.892 m)   Wt 270 lb (122.5 kg)   SpO2 98%   BMI 34.20 kg/m  General Appearance: Well nourished, in no apparent distress.  Eyes: PERRLA, EOMs, conjunctiva no swelling or erythema, normal fundi and vessels.  Sinuses: No Frontal/maxillary tenderness  ENT/Mouth: Ext aud canals clear, normal light reflex with TMs without erythema, bulging. Good dentition. No erythema, swelling, or exudate on post pharynx. Tonsils not swollen or erythematous. Hearing normal.  Neck: Supple, thyroid normal. No bruits  Respiratory: Respiratory effort normal, BS equal bilaterally without rales, rhonchi, wheezing or stridor.  Cardio: RRR without murmurs, rubs or gallops, decreased HS due to body habitus. Brisk peripheral pulses without edema.  Chest: symmetric, with normal excursions and percussion.  Abdomen: Soft, nontender, obese, + ventral hernia, no guarding, rebound, masses, or organomegaly. .  Lymphatics: Non tender without lymphadenopathy.  Genitourinary: prostate smooth, no masses, nontender, neg hemoccult Musculoskeletal: Full ROM all peripheral extremities,5/5 strength, and normal gait.  Skin: Left arm medial elbow with benign lipoma/fibroma, wants removed. Warm, dry without rashes, lesions, ecchymosis. Neuro: Cranial nerves intact, reflexes equal bilaterally. Normal muscle tone, no cerebellar symptoms. Sensation intact.  Psych: Awake and oriented X 3, normal affect, Insight and Judgment appropriate.   EKG: defer cardio AORTA SCAN: defer cardio  Vicie Mutters 3:19 PM [facility]

## 2016-06-02 NOTE — Patient Instructions (Signed)
Diabetes is a very complicated disease...lets simplify it.  An easy way to look at it to understand the complications is if you think of the extra sugar floating in your blood stream as glass shards floating through your blood stream.    Diabetes affects your small vessels first: 1) The glass shards (sugar) scraps down the tiny blood vessels in your eyes and lead to diabetic retinopathy, the leading cause of blindness in the [place]. Diabetes is the leading cause of newly diagnosed adult (70 to 62 years of age) blindness in the [place].  2) The glass shards scratches down the tiny vessels of your legs leading to nerve damage called neuropathy and can lead to amputations of your feet. More than 60% of all non-traumatic amputations of lower limbs occur in people with diabetes.  3) Over time the small vessels in your brain are shredded and closed off, individually this does not cause any problems but over a long period of time many of the small vessels being blocked can lead to Vascular Dementia.   4) Your kidney's are a filter system and have a "net" that keeps certain things in the body and lets bad things out. Sugar shreds this net and leads to kidney damage and eventually failure. Decreasing the sugar that is destroying the net and certain blood pressure medications can help stop or decrease progression of kidney disease. Diabetes was the primary cause of kidney failure in 44 percent of all new cases in 2011.  5) Diabetes also destroys the small vessels in your penis that lead to erectile dysfunction. Eventually the vessels are so damaged that you may not be responsive to cialis or viagra.   Diabetes and your large vessels: Your larger vessels consist of your coronary arteries in your heart and the carotid vessels to your brain. Diabetes or even increased sugars put you at 300% increased risk of heart attack and stroke and this is why.. The sugar scrapes down your large blood vessels and your body  sees this as an internal injury and tries to repair itself. Just like you get a scab on your skin, your platelets will stick to the blood vessel wall trying to heal it. This is why we have diabetics on low dose aspirin daily, this prevents the platelets from sticking and can prevent plaque formation. In addition, your body takes cholesterol and tries to shove it into the open wound. This is why we want your LDL, or bad cholesterol, below 70.   The combination of platelets and cholesterol over 5-10 years forms plaque that can break off and cause a heart attack or stroke.   PLEASE REMEMBER:  Diabetes is preventable! Up to 79 percent of complications and morbidities among individuals with type 2 diabetes can be prevented, delayed, or effectively treated and minimized with regular visits to a health professional, appropriate monitoring and medication, and a healthy diet and lifestyle.   Simple math prevails.    1st - exercise does not produce significant weight loss - at best one converts fat into muscle , "bulks up", loses inches, but usually stays "weight neutral"     2nd - think of your body weightas a check book: If you eat more calories than you burn up - you save money or gain weight .... Or if you spend more money than you put in the check book, ie burn up more calories than you eat, then you lose weight     3rd - if you walk or  run 1 mile, you burn up 100 calories - you have to burn up 3,500 calories to lose 1 pound, ie you have to walk/run 35 miles to lose 1 measly pound. So if you want to lose 10 #, then you have to walk/run 350 miles, so.... clearly exercise is not the solution.     4. So if you consume 1,500 calories, then you have to burn up the equivalent of 15 miles to stay weight neutral - It also stands to reason that if you consume 1,500 cal/day and don't lose weight, then you must be burning up about 1,500 cals/day to stay weight neutral.     5. If you really want to lose weight, you  must cut your calorie intake 300 calories /day and at that rate you should lose about 1 # every 3 days.   6. Please purchase Dr Fara Olden Fuhrman's book(s) "The End of Dieting" & "Eat to Live" . It has some great concepts and recipes.

## 2016-06-03 LAB — MICROALBUMIN / CREATININE URINE RATIO
CREATININE, URINE: 338 mg/dL (ref 20–370)
Microalb Creat Ratio: 3 mcg/mg creat (ref <30)
Microalb, Ur: 1.1 mg/dL

## 2016-06-03 LAB — MAGNESIUM: MAGNESIUM: 1.8 mg/dL (ref 1.5–2.5)

## 2016-06-03 LAB — URINALYSIS, ROUTINE W REFLEX MICROSCOPIC
BILIRUBIN URINE: NEGATIVE
Glucose, UA: NEGATIVE
HGB URINE DIPSTICK: NEGATIVE
Ketones, ur: NEGATIVE
LEUKOCYTES UA: NEGATIVE
Nitrite: NEGATIVE
PROTEIN: NEGATIVE
Specific Gravity, Urine: 1.026 (ref 1.001–1.035)
pH: 5.5 (ref 5.0–8.0)

## 2016-06-03 LAB — PSA: PSA: 3 ng/mL (ref <=4.0)

## 2016-06-03 LAB — VITAMIN D 25 HYDROXY (VIT D DEFICIENCY, FRACTURES): VIT D 25 HYDROXY: 36 ng/mL (ref 30–100)

## 2016-06-03 LAB — TSH: TSH: 1.73 m[IU]/L (ref 0.40–4.50)

## 2016-06-10 ENCOUNTER — Other Ambulatory Visit: Payer: Self-pay | Admitting: Physician Assistant

## 2016-06-12 ENCOUNTER — Ambulatory Visit: Payer: 59 | Admitting: Internal Medicine

## 2016-07-04 ENCOUNTER — Ambulatory Visit: Payer: Self-pay | Admitting: Internal Medicine

## 2016-07-04 NOTE — Progress Notes (Signed)
NO SHOW

## 2016-07-11 ENCOUNTER — Encounter: Payer: Self-pay | Admitting: Internal Medicine

## 2016-07-11 ENCOUNTER — Ambulatory Visit (INDEPENDENT_AMBULATORY_CARE_PROVIDER_SITE_OTHER): Payer: 59 | Admitting: Internal Medicine

## 2016-07-11 VITALS — BP 134/88 | HR 66 | Ht 74.0 in | Wt 265.0 lb

## 2016-07-11 DIAGNOSIS — E1165 Type 2 diabetes mellitus with hyperglycemia: Secondary | ICD-10-CM | POA: Diagnosis not present

## 2016-07-11 DIAGNOSIS — Z794 Long term (current) use of insulin: Secondary | ICD-10-CM

## 2016-07-11 DIAGNOSIS — IMO0002 Reserved for concepts with insufficient information to code with codable children: Secondary | ICD-10-CM

## 2016-07-11 DIAGNOSIS — E1121 Type 2 diabetes mellitus with diabetic nephropathy: Secondary | ICD-10-CM | POA: Diagnosis not present

## 2016-07-11 LAB — POCT GLYCOSYLATED HEMOGLOBIN (HGB A1C): Hemoglobin A1C: 6.5

## 2016-07-11 NOTE — Patient Instructions (Addendum)
Please continue:  Metformin 1000 mg 2x a day with lunch and dinner.  Trulicity 1.5 mg once a week in am   Lantus 35 units 2x a day.  Humalog: - 10-15 units before a smaller meal - 20-25 units before a regular meal - 30-35 units before a larger meal or if you have dessert.   Do not take any Humalog at bedtime unless sugars > 300 (+ 5 units), or > 400 (+ 10 units).  Please move Humalog BEFORE meals (15 min before).  Please schedule an appt with Antonieta Iba with nutrition.  Please call your insurance to see if they cover a pump and which pump (?Medtronic).  Please stop at the lab.  Please return in 3 months with your sugar log.

## 2016-07-11 NOTE — Progress Notes (Addendum)
Patient ID: Harry [name], male   DOB: 1955/07/30, 61 y.o.   MRN: [id number]   HPI: Harry [name] is a 61 y.o.-year-old male,  management of DM2, dx in early 1990s, insulin-dependent since 1 year after dx, uncontrolled, with complications (CKD stage 2, ED). Last visit 4 mo ago. He saw Dr. Electa Sniff before   Last hemoglobin A1c was: Lab Results  Component Value Date   HGBA1C 6.8 03/13/2016   HGBA1C 7.1 11/12/2015   HGBA1C 7.7 (H) 08/08/2015   Now on:  Metformin 1000 mg 2x a day with lunch and dinner- STILL FORGETS IT FREQUENTLY!  Trulicity 1.5 mg once a week in am  - started 03/2016  Lantus 35 units 2x a day.  Humalog: Still injects it after the meals - 10-15 units before a smaller meal - 20-25 units before a regular meal - 30-35 units before a larger meal or if you have dessert.   Do not take any Humalog at bedtime unless sugars > 300 (+ 5 units), or > 400 (+ 10 units).  Pt checks his sugars 2x a day and they are still very fluctuating: - am: 47, 72-265 >> 68, 91-181, 232, 254 (B: 15-20 units) >> 68-255, 280 >> 59-345 (high when forgets insulin) - 2h after b'fast: 180-313 >> n/c >> 165 - before lunch: n/c  (L: 25 units) >> 164-325 >> n/c - 2h after lunch: n/c >> 354, 362 >> 72 - before dinner: 268 >> n/c (D: 25 units) >> n/c - 2h after dinner: 117, 169-440 >>> n/c  - bedtime: 122-423 >> 90, 122-338 >> 61-401 (higher if misses lunch Humalog) >>55, 85-434 - nighttime: n/c >>> 66 + rarely lows. Lowest sugar was 39 (at night; after exercise) >> 30s at night (took the Humalog dose for dinner, then 10 units at bedtime for a CBG 280s!!!!) >> 52 >> 55; he has hypoglycemia awareness at 60-70.  Highest sugar was in the 400 >> 400 - in [place]>> 401 >> 400s  Glucometer: one touch ultra 2  Pt's meals are: - Breakfast: Glucerna, banana - Lunch: eats out - salad with chicken; chicken burrito; sandwich; chick-fil-a - Dinner: meat + veggie + starch - Snacks: potato chips, fruit,  icecream No sodas.  - no CKD, last BUN/creatinine:  Lab Results  Component Value Date   BUN 15 06/02/2016   BUN 19 11/26/2015   CREATININE 1.24 06/02/2016   CREATININE 1.17 11/26/2015  On Losartan. - last set of lipids: Lab Results  Component Value Date   CHOL 134 06/02/2016   HDL 44 06/02/2016   LDLCALC 50 06/02/2016   TRIG 198 (H) 06/02/2016   CHOLHDL 3.0 06/02/2016  On Lipitor.  - last eye exam was 02/2016 - Dr. Gershon Crane. No DR. - no numbness and tingling in his feet.  He also has a history of HTN, HL, GERD.  ROS: Constitutional: no weight gain/no weight loss, + fatigue, no subjective hyperthermia, no subjective hypothermia Eyes: no blurry vision, no xerophthalmia ENT: no sore throat, no nodules palpated in throat, no dysphagia, no odynophagia, no hoarseness Cardiovascular: no CP/no SOB/no palpitations/no leg swelling Respiratory: no cough/no SOB/no wheezing Gastrointestinal: no N/no V/no D/no C/no acid reflux Musculoskeletal: no muscle aches/no joint aches Skin: no rashes, no hair loss Neurological: no tremors/no numbness/no tingling/no dizziness  I reviewed pt's medications, allergies, PMH, social hx, family hx, and changes were documented in the history of present illness. Otherwise, unchanged from my initial visit note.   Past Medical History:  Diagnosis  Date  . CKD (chronic kidney disease) stage 2, GFR 60-89 ml/min    secondary to DM  . Diabetes mellitus without complication ([place]) 5009   insulin requiring  . GERD (gastroesophageal reflux disease)   . Hyperlipidemia   . Hypertension   . Hypokalemia   . Hypomagnesemia   . OSA on CPAP    10-12 years ago  . Vitamin D deficiency    Past Surgical History:  Procedure Laterality Date  . ANAL FISSURE REPAIR  2007  . VENTRAL HERNIA REPAIR  04/13/12   Dr. Johney Maine, ventral hernia repair w/ mesh   Social History   Social History  . Marital status: Married    Spouse name: N/A  . Number of children: 2    Occupational History  . estimator   Social History Main Topics  . Smoking status: Never Smoker  . Smokeless tobacco: Not on file  . Alcohol use 0.0 oz/week    2 - 5 Cans of beer per week   Current Outpatient Prescriptions on File Prior to Visit  Medication Sig Dispense Refill  . aspirin 81 MG tablet Take 81 mg by mouth daily.    Marland Kitchen atorvastatin (LIPITOR) 40 MG tablet Take 1 tablet (40 mg total) by mouth daily. for cholesterol 90 tablet 1  . BD INSULIN SYRINGE ULTRAFINE 31G X 5/16" 0.5 ML MISC USE TWICE A DAY AS DIRECTED 200 each 49  . bisoprolol-hydrochlorothiazide (ZIAC) 5-6.25 MG tablet TAKE 1 TABLET DAILY FOR BLOOD PRESSURE 90 tablet 1  . Dulaglutide (TRULICITY) 1.5 FG/1.8EX SOPN Inject 1.5 mg weekly. 12 pen 0  . FLUCELVAX QUADRIVALENT 0.5 ML SUSY inject 0.5 milliliter intramuscularly  0  . glucose 4 GM chewable tablet Chew 1 tablet (4 g total) by mouth as needed for low blood sugar. 30 tablet 12  . insulin glargine (LANTUS) 100 UNIT/ML injection INJECT 35 UNITS UNDER THE SKIN 2x a day 60 mL 4  . insulin lispro (HUMALOG) 100 UNIT/ML injection INJECT 60 TO 80 UNITS UNDER THE SKIN PER DAY WITH MEALS DEPENDING ON MEALS 210 mL 4  . losartan-hydrochlorothiazide (HYZAAR) 100-12.5 MG tablet TAKE 1 TABLET DAILY 90 tablet 1  . metFORMIN (GLUCOPHAGE) 500 MG tablet Take 2 tablets (1,000 mg total) by mouth 2 (two) times daily with a meal. 360 tablet 3  . omeprazole (PRILOSEC) 20 MG capsule TAKE 1 CAPSULE DAILY FOR ACID REFLUX 90 capsule 1  . ONE TOUCH ULTRA TEST test strip USE 3 TO 4 TIMES DAILY FOR FLUCTUATING BLOOD SUGARS 400 each 3  . potassium chloride SA (K-DUR,KLOR-CON) 20 MEQ tablet TAKE 1 TABLET TWICE A DAY 180 tablet 0  . tadalafil (CIALIS) 20 MG tablet Take 1 tablet (20 mg total) by mouth daily as needed for erectile dysfunction. 60 tablet 3   No current facility-administered medications on file prior to visit.    Allergies  Allergen Reactions  . Crestor [Rosuvastatin]    Family  History  Problem Relation Age of Onset  . Cancer Father        kidney    PE: BP 134/88 (BP Location: Left Arm, Patient Position: Sitting)   Pulse 66   Ht 6\' 2"  (1.88 m)   Wt 265 lb (120.2 kg)   SpO2 98%   BMI 34.02 kg/m  Wt Readings from Last 3 Encounters:  07/11/16 265 lb (120.2 kg)  06/02/16 270 lb (122.5 kg)  03/13/16 272 lb (123.4 kg)   Constitutional: overweight, in NAD Eyes: PERRLA, EOMI, no exophthalmos ENT: moist mucous  membranes, no thyromegaly, no cervical lymphadenopathy Cardiovascular: RRR, +1 SEM, No R,G Respiratory: CTA B Gastrointestinal: abdomen soft, NT, ND, BS+ Musculoskeletal: no deformities, strength intact in all 4 Skin: moist, warm, no rashes Neurological: no tremor with outstretched hands, DTR normal in all 4  ASSESSMENT: 1. DM2, insulin-dependent, uncontrolled, with complications - CKD stage 2 - ED  - he would be interested in insulin pump   - he has an elliptical machine at home  PLAN:  1. Patient with long-standing, uncontrolled diabetes, on oral antidiabetic regimen + basal-bolus insulin regimen + Trulicity added at last visit. Today, HbA1c is 6.5%, but sugars are too fluctuating to draw conclusions about trends. He skips doses of his meds frequently and also takes Humalog after meals, which is conducive to fluctuations >> discussed about how to take this and oral meds correctly. - we will check him for DM1 today - he is interested in a pump >> will call insurance to see if we can start - I advised him to  to:  Patient Instructions  Please continue:  Metformin 1000 mg 2x a day with lunch and dinner.  Trulicity 1.5 mg once a week in am   Lantus 35 units 2x a day.  Humalog: - 10-15 units before a smaller meal - 20-25 units before a regular meal - 30-35 units before a larger meal or if you have dessert.   Do not take any Humalog at bedtime unless sugars > 300 (+ 5 units), or > 400 (+ 10 units).  Please move Humalog BEFORE meals (15 min  before).  Please schedule an appt with Antonieta Iba with nutrition.  Please call your insurance to see if they cover a pump and which pump (?Medtronic).  Please stop at the lab.  Please return in 3 months with your sugar log.   - continue checking sugars at different times of the day - check 3-4x a day, rotating checks - advised for yearly eye exams >> he is UTD - advised for flu shot >> he is UTD - Return to clinic in 3 mo with sugar log   Component     Latest Ref Rng & Units 07/11/2016  Hemoglobin A1C      6.5  Glutamic Acid Decarb Ab     <5 IU/mL 47 (H)  Pancreatic Islet Cell Antibody     <5 JDF Units <5  C-Peptide     0.80 - 3.85 ng/mL 0.37 (L)  Glucose, Fasting     65 - 99 mg/dL 104 (H)   Elevated GAD antibodies and decreased insulin production. 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 I would not change the treatment regimen for now.  [name], MD PhD [facility] Endocrinology

## 2016-07-12 LAB — GLUCOSE, FASTING: GLUCOSE, FASTING: 104 mg/dL — AB (ref 65–99)

## 2016-07-12 LAB — C-PEPTIDE: C-Peptide: 0.37 ng/mL — ABNORMAL LOW (ref 0.80–3.85)

## 2016-07-13 ENCOUNTER — Other Ambulatory Visit: Payer: Self-pay | Admitting: Physician Assistant

## 2016-07-15 LAB — GLUTAMIC ACID DECARBOXYLASE AUTO ABS: Glutamic Acid Decarb Ab: 47 IU/mL — ABNORMAL HIGH (ref <5)

## 2016-07-18 LAB — ANTI-ISLET CELL ANTIBODY: Pancreatic Islet Cell Antibody: <5 JDF Units (ref <5)

## 2016-08-04 ENCOUNTER — Ambulatory Visit: Payer: 59 | Admitting: Internal Medicine

## 2016-08-08 ENCOUNTER — Encounter: Payer: 59 | Admitting: Dietician

## 2016-08-22 ENCOUNTER — Encounter: Payer: Self-pay | Admitting: Dietician

## 2016-08-22 ENCOUNTER — Encounter: Payer: 59 | Attending: Internal Medicine | Admitting: Dietician

## 2016-08-22 DIAGNOSIS — Z794 Long term (current) use of insulin: Secondary | ICD-10-CM | POA: Diagnosis not present

## 2016-08-22 DIAGNOSIS — IMO0002 Reserved for concepts with insufficient information to code with codable children: Secondary | ICD-10-CM

## 2016-08-22 DIAGNOSIS — Z713 Dietary counseling and surveillance: Secondary | ICD-10-CM | POA: Diagnosis not present

## 2016-08-22 DIAGNOSIS — E1121 Type 2 diabetes mellitus with diabetic nephropathy: Secondary | ICD-10-CM | POA: Diagnosis not present

## 2016-08-22 DIAGNOSIS — E1165 Type 2 diabetes mellitus with hyperglycemia: Secondary | ICD-10-CM | POA: Insufficient documentation

## 2016-08-22 NOTE — Patient Instructions (Signed)
Be active most days of the week for at least 30 minutes. Speak with insurance about what kind of a pump they cover (?Medtronic). Increase your water intake.  Be sure you stay hydrated. Consider CoQ10 supplement. Start carb counting.  Aim for 3-4 carbohydrate choices per meal.  What foods contain carbs?  What is the portion size? Small amounts of protein with each meal or snack. Continue to work to remember to take your lunch time insulin and Metformin.  Consider getting a bag to put them in so you can bring them with you but do not leave them in a hot car. Continue to check your blood sugar.

## 2016-08-22 NOTE — Progress Notes (Signed)
Diabetes Self-Management Education  Visit Type: First/Initial  Appt. Start Time: 1045 Appt. End Time: 1220  08/22/2016  [name], identified by name and date of birth, is a 61 y.o. male with a diagnosis of Diabetes: Type 2 (insulin requiring). Patient has been insulin requiring since one year after diagnosis.  Other hx includes CKD stage 2, ED, HTN, HLD, vitamin D deficiency, and GERD.  Weight is stable betwwn 265-275 lbs.   His C-peptide was 0.37 and he is considering an insulin pump.  (Briefly discussed pumps today).  Discussed carbohydrate counting and patient is able to do this well.  Medications include Metformin with lunch and dinner, lantus 35 units bid, and Humalog (sliding scale based on meal size and blood sugar), and vitamin D.  He has stopped the Trulicity as he felt that he was getting "congestion in the neck".    He was missing the Humalog and metformin frequently but patient states that this has improved.   Patient lives with his wife.  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  ASSESSMENT  Height 6\' 2"  (1.88 m), weight 268 lb (121.6 kg). Body mass index is 34.41 kg/m.      Diabetes Self-Management Education - 08/22/16 1115      Visit Information   Visit Type First/Initial     Initial Visit   Diabetes Type Type 2  insulin requiring   Are you currently following a meal plan? No   Are you taking your medications as prescribed? Yes  but forgets lunch time insulin and metformin frequently   Date Diagnosed early 1990's     Health Coping   How would you rate your overall health? Good     Psychosocial Assessment   Patient Belief/Attitude about Diabetes Motivated to manage diabetes   Self-care barriers None   Self-management support Doctor's office;Family   Patient Concerns Nutrition/Meal planning;Other (comment)  carbohydrate counting   Special Needs None   Preferred Learning Style No preference indicated   Learning Readiness  Ready   How often do you need to have someone help you when you read instructions, pamphlets, or other written materials from your doctor or pharmacy? 1 - Never   What is the last grade level you completed in school? 4 years college     Pre-Education Assessment   Patient understands the diabetes disease and treatment process. Needs Review   Patient understands incorporating nutritional management into lifestyle. Needs Review   Patient undertands incorporating physical activity into lifestyle. Needs Review   Patient understands using medications safely. Needs Review   Patient understands monitoring blood glucose, interpreting and using results Needs Review   Patient understands prevention, detection, and treatment of acute complications. Needs Review   Patient understands prevention, detection, and treatment of chronic complications. Needs Review   Patient understands how to develop strategies to address psychosocial issues. Needs Review   Patient understands how to develop strategies to promote health/change behavior. Needs Review     Complications   Last HgB A1C per patient/outside source 6.5 %  07/2016 which is decreased from 8.1% 04/18/15   How often do you check your blood sugar? 1-2 times/day   Fasting Blood glucose range (mg/dL) 130-179;70-129   Number of hypoglycemic episodes per month 1   Can you tell when your blood sugar is low? Yes   What do you do if your blood sugar is low? drink OJ or glucose tabs AND eat a large bowl of cereal after if it was  very low.   Number of hyperglycemic episodes per week 3   Can you tell when your blood sugar is high? Yes   What do you do if your blood sugar is high? take extra humalog   Have you had a dilated eye exam in the past 12 months? Yes   Have you had a dental exam in the past 12 months? Yes   Are you checking your feet? Yes   How many days per week are you checking your feet? 7     Dietary Intake   Breakfast Glucerna and banana, coffee  with half and half and 1 tsp sugar  7:15   Snack (morning) none   Lunch Out- Salad with chicken sometimes with bread OR [employer] grill: chicken, pita, salad  12   Snack (afternoon) cookie OR nuts OR potato chips OR crackers and 1-2 beer   Dinner grilled chicken, squash, rice  7:30-8:30   Snack (evening) occasional ice cream (2 scoops) or simular to afternoon snack OR crackers with cheese   Beverage(s) 1-2 beer, coffee with half and half and 1 tsp sugar, a little water     Exercise   Exercise Type Light (walking / raking leaves)  cleaning pool, gardening, takes dogs out     Patient Education   Previous Diabetes Education Yes (please comment)  when diagnosed   Disease state  Other (comment)  Review   Nutrition management  Role of diet in the treatment of diabetes and the relationship between the three main macronutrients and blood glucose level;Food label reading, portion sizes and measuring food.;Carbohydrate counting;Meal options for control of blood glucose level and chronic complications.;Effects of alcohol on blood glucose and safety factors with consumption of alcohol.   Physical activity and exercise  Role of exercise on diabetes management, blood pressure control and cardiac health.   Medications Reviewed patients medication for diabetes, action, purpose, timing of dose and side effects.   Monitoring Identified appropriate SMBG and/or A1C goals.   Acute complications Discussed and identified patients' treatment of hyperglycemia.   Chronic complications Relationship between chronic complications and blood glucose control;Assessed and discussed foot care and prevention of foot problems   Psychosocial adjustment Worked with patient to identify barriers to care and solutions;Role of stress on diabetes   Personal strategies to promote health Lifestyle issues that need to be addressed for better diabetes care     Individualized Goals (developed by patient)   Nutrition General guidelines  for healthy choices and portions discussed   Physical Activity Exercise 5-7 days per week;30 minutes per day   Medications take my medication as prescribed   Monitoring  test my blood glucose as discussed   Reducing Risk examine blood glucose patterns;do foot checks daily   Health Coping discuss diabetes with (comment)  MD/RD/CDE     Post-Education Assessment   Patient understands the diabetes disease and treatment process. Demonstrates understanding / competency   Patient understands incorporating nutritional management into lifestyle. Demonstrates understanding / competency   Patient undertands incorporating physical activity into lifestyle. Demonstrates understanding / competency   Patient understands using medications safely. Demonstrates understanding / competency   Patient understands monitoring blood glucose, interpreting and using results Demonstrates understanding / competency   Patient understands prevention, detection, and treatment of acute complications. Demonstrates understanding / competency   Patient understands prevention, detection, and treatment of chronic complications. Demonstrates understanding / competency   Patient understands how to develop strategies to address psychosocial issues. Demonstrates understanding / competency  Patient understands how to develop strategies to promote health/change behavior. Demonstrates understanding / competency     Outcomes   Expected Outcomes Demonstrated interest in learning. Expect positive outcomes   Future DMSE PRN   Program Status Completed      Individualized Plan for Diabetes Self-Management Training:   Learning Objective:  Patient will have a greater understanding of diabetes self-management. Patient education plan is to attend individual and/or group sessions per assessed needs and concerns.   Plan:   Patient Instructions  Be active most days of the week for at least 30 minutes. Speak with insurance about what kind  of a pump they cover (?Medtronic). Increase your water intake.  Be sure you stay hydrated. Consider CoQ10 supplement. Start carb counting.  Aim for 3-4 carbohydrate choices per meal.  What foods contain carbs?  What is the portion size? Small amounts of protein with each meal or snack. Continue to work to remember to take your lunch time insulin and Metformin.  Consider getting a bag to put them in so you can bring them with you but do not leave them in a hot car. Continue to check your blood sugar.   Expected Outcomes:  Demonstrated interest in learning. Expect positive outcomes  Education material provided: Living Well with Diabetes, Food label handouts, A1C conversion sheet, Meal plan card, My Plate and Snack sheet  If problems or questions, patient to contact team via:  Phone  Future DSME appointment: PRN

## 2016-09-01 ENCOUNTER — Other Ambulatory Visit: Payer: Self-pay | Admitting: Internal Medicine

## 2016-09-08 ENCOUNTER — Other Ambulatory Visit: Payer: Self-pay | Admitting: Internal Medicine

## 2016-09-10 ENCOUNTER — Encounter: Payer: Self-pay | Admitting: Internal Medicine

## 2016-09-10 ENCOUNTER — Ambulatory Visit (INDEPENDENT_AMBULATORY_CARE_PROVIDER_SITE_OTHER): Payer: 59 | Admitting: Internal Medicine

## 2016-09-10 VITALS — BP 124/64 | HR 66 | Temp 97.5°F | Resp 16 | Ht 74.0 in | Wt 267.4 lb

## 2016-09-10 DIAGNOSIS — I10 Essential (primary) hypertension: Secondary | ICD-10-CM | POA: Diagnosis not present

## 2016-09-10 DIAGNOSIS — D485 Neoplasm of uncertain behavior of skin: Secondary | ICD-10-CM

## 2016-09-10 MED ORDER — METFORMIN HCL ER 500 MG PO TB24: ORAL_TABLET | ORAL | 3 refills | Status: DC

## 2016-09-10 NOTE — Progress Notes (Signed)
  Subjective:    Patient ID: Harry [name], male    DOB: September 09, 1955, 61 y.o.   MRN: [id number]  HPI  This nice 61 yo MWM with HTN, HLD, T2_DM, presents for evaluation and has a negative CV systems review. He also has a 1 cm cystic subcutaneous lesion proximal to the Lt elbow which he is concerned about.  Medication Sig  . aspirin 81 MG Take  daily.  Marland Kitchen atorvastatin  40 MG  TAKE 1 TAB DAILY   . bisoprolol-hctz 5-6.25 MG TAKE 1 TAB DAILY   . VITAMIN D 1000 units tablet Take  daily.  . TRULICITY 1.5 HE/5.2DP  Inject 1.5 mg weekly.  Marland Kitchen glucose 4 GM  tab Chew 1 tab as needed for low blood sugar.  Marland Kitchen LANTUS 100 UNIT/ML injec INJECT 35 UNITS 2 x /day  . HUMALOG 100 UNIT/ML injec INJECT 60-80 UNITS  / DAY WITH MEALS   . losartan-hctz 100-12.5 MG  TAKE 1 TAB DAILY  . metFORMIN  500 MG tablet Take 2 tab  2 x daily with a meal.  . omeprazole  20 MG  TAKE 1 CAP DAILY FOR ACID REFLUX  . K-DUR 20 MEQ tab TAKE 1 TAB TWICE A DAY  . tadalafil  20 MG  Take 1 tab daily as needed for erectile dysfunction.   Allergies  Allergen Reactions  . Crestor [Rosuvastatin]    Past Medical History:  Diagnosis Date  . CKD (chronic kidney disease) stage 2, GFR 60-89 ml/min    secondary to DM  . Diabetes mellitus without complication ([place]) 8242   insulin requiring  . GERD (gastroesophageal reflux disease)   . Hyperlipidemia   . Hypertension   . Hypokalemia   . Hypomagnesemia   . OSA on CPAP    10-12 years ago  . Vitamin D deficiency    Review of Systems  Patient c/o poorly formed BM's & occas urgency & fecal soiling    Objective:   Physical Exam  BP 124/64   Pulse 66   Temp (!) 97.5 F (36.4 C)   Resp 16   Ht 6\' 2"  (1.88 m)   Wt 267 lb 6.4 oz (121.3 kg)   BMI 34.33 kg/m   HEENT - WNL. Neck - supple.  Chest - Clear equal BS. Cor - Nl HS. RRR w/o sig MGR. PP 1(+). No edema. MS- FROM w/o deformities.  Gait Nl. Neuro -  Nl w/o focal abnormalities. Skin - there is a 10-12 mm tender subcutaneous  lesion along the medial distal Left arm.   Procedure (CPT- 35361) - after informed consent and aseptic prep with alcohol the area was locally anesthetized with 1 ml of lidocaine 1%. Then with a #11 scalpel the lesion was excised full thickness in an elliptical fashion to remover the lesion in toto and it was sectioned and found to be a sebaceous cyst. Then the wound edges were approximated with  #2 vertical mattress sutures with 3-0 proline. Then the opposed skin edges were aligned, everted and secured with  #4 running sutures with Proline 3-0. 2" x 3 " sterile dressing with Tegaderm applied.  Patient was instructed in wound care.     Assessment & Plan:   1. Essential hypertension   2. Neoplasm of uncertain behavior of skin  - excised (CPT 11401)   - ROV 8-9 days for suture removal

## 2016-09-19 ENCOUNTER — Ambulatory Visit: Payer: 59 | Admitting: Internal Medicine

## 2016-09-19 VITALS — BP 124/82 | HR 54 | Temp 97.5°F | Resp 16 | Ht 74.0 in | Wt 269.4 lb

## 2016-09-19 DIAGNOSIS — L723 Sebaceous cyst: Secondary | ICD-10-CM

## 2016-09-19 NOTE — Progress Notes (Signed)
  Patient returns post excision of a sebaceous Cyst around the L elbow on 7/11.   Wound appears clean, well healed w/o signs of infection.   Sutures removed w/o difficulty

## 2016-10-22 ENCOUNTER — Encounter: Payer: Self-pay | Admitting: Internal Medicine

## 2016-10-22 ENCOUNTER — Ambulatory Visit (INDEPENDENT_AMBULATORY_CARE_PROVIDER_SITE_OTHER): Payer: 59 | Admitting: Internal Medicine

## 2016-10-22 VITALS — BP 128/80 | HR 52 | Ht 74.0 in | Wt 269.0 lb

## 2016-10-22 DIAGNOSIS — Z794 Long term (current) use of insulin: Secondary | ICD-10-CM

## 2016-10-22 DIAGNOSIS — IMO0002 Reserved for concepts with insufficient information to code with codable children: Secondary | ICD-10-CM

## 2016-10-22 DIAGNOSIS — E785 Hyperlipidemia, unspecified: Secondary | ICD-10-CM

## 2016-10-22 DIAGNOSIS — E1165 Type 2 diabetes mellitus with hyperglycemia: Secondary | ICD-10-CM | POA: Diagnosis not present

## 2016-10-22 DIAGNOSIS — E1121 Type 2 diabetes mellitus with diabetic nephropathy: Secondary | ICD-10-CM | POA: Diagnosis not present

## 2016-10-22 LAB — POCT GLYCOSYLATED HEMOGLOBIN (HGB A1C): Hemoglobin A1C: 6.8

## 2016-10-22 NOTE — Patient Instructions (Addendum)
Please restart:  Metformin ER 1000 mg 2x a day with lunch and dinner.  Trulicity 1.5 mg weekly  Please continue:  Lantus 35 units 2x a day.  Please decrease:  Humalog: - 10 units before a smaller meal - 20 units before a regular meal - 30 units before a larger meal or if you have dessert.   Do not take any Humalog at bedtime unless sugars > 300 (+ 5 units), or > 400 (+ 10 units).  Please return in 3 months with your sugar log.

## 2016-10-22 NOTE — Progress Notes (Signed)
Patient ID: Harry [name], male   DOB: Feb 07, 1956, 61 y.o.   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 Last visit 3 mo ago. He saw Dr. Electa Sniff before   Last hemoglobin A1c was: Lab Results  Component Value Date   HGBA1C 6.5 07/11/2016   HGBA1C 6.8 03/13/2016   HGBA1C 7.1 11/12/2015   Component     Latest Ref Rng & Units 07/11/2016  Hemoglobin A1C      6.5  Glutamic Acid Decarb Ab     <5 IU/mL 47 (H)  Pancreatic Islet Cell Antibody     <5 JDF Units <5  C-Peptide     0.80 - 3.85 ng/mL 0.37 (L)  Glucose, Fasting     65 - 99 mg/dL 104 (H)   Elevated GAD antibodies and decreased insulin production. 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  Now on:  Metformin 1000 mg 2x a day with lunch and dinner  Trulicity 1.5 mg once a week in am  - started 03/2016  Lantus 35 units 2x a day.  Humalog:  - 10-15 units before a smaller meal - 20-25 units before a regular meal - 30-35 units before a larger meal or if you have dessert.   Do not take any Humalog at bedtime unless sugars > 300 (+ 5 units), or > 400 (+ 10 units).  Pt checks his sugars 2x a day >> very fluctuating. - am:  68-255, 280 >> 59-345 (high when forgets insulin) >> 100-234 - 2h after b'fast: 180-313 >> n/c >> 165 - before lunch: n/c  (L: 25 units) >> 164-325 >> n/c - 2h after lunch: n/c >> 354, 362 >> 72 - before dinner: 268 >> n/c (D: 25 units) >> n/c - 2h after dinner: 117, 169-440 >>> n/c  - bedtime: 61-401 (higher if misses lunch Humalog) >>55, 85-434 >> 180-285 - nighttime: n/c >>> 66 + rarely lows. Lowest sugar was 39 (at night; after exercise) >> 30s  >> 52 >> 55 >> 60s x2 >> 70; he has hypoglycemia awareness at 60-70.  Highest sugar was in the 400s >> upper  200s  Glucometer: one touch ultra 2  Pt's meals are: - Breakfast: Glucerna, banana - Lunch: eats out - salad with chicken; chicken burrito; sandwich; [employer] - Dinner: meat + veggie + starch - Snacks: potato chips, fruit, icecream No sodas.  - + mild CKD, last BUN/creatinine:  Lab Results  Component Value Date   BUN 15 06/02/2016   BUN 19 11/26/2015   CREATININE 1.24 06/02/2016   CREATININE 1.17 11/26/2015  On Losartan - last set of lipids: Lab Results  Component Value Date   CHOL 134 06/02/2016   HDL 44 06/02/2016   LDLCALC 50 06/02/2016   TRIG 198 (H) 06/02/2016   CHOLHDL 3.0 06/02/2016  On Lipitor  - last eye exam was 02/2016 >> No DR- Dr. Gershon Crane - denies numbness and tingling in his feet.  He also has a history of HTN, HL, GERD.  ROS: Constitutional: no weight gain/no weight loss, + fatigue, no subjective hyperthermia, no subjective hypothermia Eyes: no blurry vision, no xerophthalmia ENT: no sore throat, no nodules palpated in throat, no dysphagia, no odynophagia, no hoarseness Cardiovascular: no CP/no SOB/no palpitations/no leg  swelling Respiratory: no cough/no SOB/no wheezing Gastrointestinal: no N/no V/+ D/no C/no acid reflux Musculoskeletal: no muscle aches/no joint aches Skin: no rashes, no hair loss Neurological: no tremors/no numbness/no tingling/no dizziness  I reviewed pt's medications, allergies, PMH, social hx, family hx, and changes were documented in the history of present illness. Otherwise, unchanged from my initial visit note.    Past Medical History:  Diagnosis Date  . CKD (chronic kidney disease) stage 2, GFR 60-89 ml/min    secondary to DM  . Diabetes mellitus without complication ([place]) 2878   insulin requiring  . GERD (gastroesophageal reflux disease)   . Hyperlipidemia   . Hypertension   . Hypokalemia   . Hypomagnesemia   . OSA on CPAP    10-12 years ago  . Vitamin D deficiency    Past Surgical History:  Procedure  Laterality Date  . ANAL FISSURE REPAIR  2007  . VENTRAL HERNIA REPAIR  04/13/12   Dr. Johney Maine, ventral hernia repair w/ mesh   Social History   Social History  . Marital status: Married    Spouse name: N/A  . Number of children: 2   Occupational History  . estimator   Social History Main Topics  . Smoking status: Never Smoker  . Smokeless tobacco: Not on file  . Alcohol use 0.0 oz/week    2 - 5 Cans of beer per week   Current Outpatient Prescriptions on File Prior to Visit  Medication Sig Dispense Refill  . aspirin 81 MG tablet Take 81 mg by mouth daily.    Marland Kitchen atorvastatin (LIPITOR) 40 MG tablet TAKE 1 TABLET DAILY FOR CHOLESTEROL 90 tablet 0  . BD INSULIN SYRINGE ULTRAFINE 31G X 5/16" 0.5 ML MISC USE TWICE A DAY AS DIRECTED 200 each 49  . bisoprolol-hydrochlorothiazide (ZIAC) 5-6.25 MG tablet TAKE 1 TABLET DAILY FOR BLOOD PRESSURE 90 tablet 1  . cholecalciferol (VITAMIN D) 1000 units tablet Take 1,000 Units by mouth daily.    . Dulaglutide (TRULICITY) 1.5 MV/6.7MC SOPN Inject 1.5 mg weekly. 12 pen 0  . FLUCELVAX QUADRIVALENT 0.5 ML SUSY inject 0.5 milliliter intramuscularly  0  . glucose 4 GM chewable tablet Chew 1 tablet (4 g total) by mouth as needed for low blood sugar. 30 tablet 12  . insulin glargine (LANTUS) 100 UNIT/ML injection INJECT 35 UNITS UNDER THE SKIN 2x a day 60 mL 4  . insulin lispro (HUMALOG) 100 UNIT/ML injection INJECT 60 TO 80 UNITS UNDER THE SKIN PER DAY WITH MEALS DEPENDING ON MEALS 210 mL 4  . losartan-hydrochlorothiazide (HYZAAR) 100-12.5 MG tablet TAKE 1 TABLET DAILY 90 tablet 1  . omeprazole (PRILOSEC) 20 MG capsule TAKE 1 CAPSULE DAILY FOR ACID REFLUX 90 capsule 0  . ONE TOUCH ULTRA TEST test strip USE 3 TO 4 TIMES DAILY FOR FLUCTUATING BLOOD SUGARS 400 each 3  . potassium chloride SA (K-DUR,KLOR-CON) 20 MEQ tablet TAKE 1 TABLET TWICE A DAY 180 tablet 0  . tadalafil (CIALIS) 20 MG tablet Take 1 tablet (20 mg total) by mouth daily as needed for erectile  dysfunction. 60 tablet 3  . metFORMIN (GLUCOPHAGE-XR) 500 MG 24 hr tablet Take 2 tablets 2 x / day with food for Diabetes (Patient not taking: Reported on 10/22/2016) 360 tablet 3   No current facility-administered medications on file prior to visit.    Allergies  Allergen Reactions  . Crestor [Rosuvastatin]    Family History  Problem Relation Age of Onset  . Cancer Father  kidney    PE: BP 128/80 (BP Location: Left Arm, Patient Position: Sitting)   Pulse (!) 52   Ht 6\' 2"  (1.88 m)   Wt 269 lb (122 kg)   SpO2 94%   BMI 34.54 kg/m   Wt Readings from Last 3 Encounters:  10/22/16 269 lb (122 kg)  09/19/16 269 lb 6.4 oz (122.2 kg)  09/10/16 267 lb 6.4 oz (121.3 kg)   Constitutional: overweight, in NAD Eyes: PERRLA, EOMI, no exophthalmos ENT: moist mucous membranes, no thyromegaly, no cervical lymphadenopathy Cardiovascular: RRR, No MRG Respiratory: CTA B Gastrointestinal: abdomen soft, NT, ND, BS+ Musculoskeletal: no deformities, strength intact in all 4 Skin: moist, warm, no rashes Neurological: no tremor with outstretched hands, DTR normal in all 4   ASSESSMENT: 1. LADA, insulin-dependent, uncontrolled, with complications - CKD stage 2 - ED  - he would be interested in an insulin pump  - he has an elliptical machine at home  2. HL  PLAN:  1. Patient with long-standing, uncontrolled DM, now with worse control after stopping Metformin (to see if his diarrhea improved >> it did not) >> he just refilled Metformin ER Rx given by PCP >> plans to restart Metformin at the previous dose. He also stopped Trulicity as he felt tightness in his throat. No nodules on palpation >> reassured the pt that the [place] associated with Trulicity has only been seen in rats. He is low risk for this >> advised to restart Trulicity as sugars increase throughout the day. He agrees. - he may have lows later in the day and occasionally in am >> will reduce his max Humalog bolus doses - he is  interested in a pump >> needs to call his insurance and see if covered - I advised him to  to:  Patient Instructions  Please restart:  Metformin ER 1000 mg 2x a day with lunch and dinner.  Trulicity 1.5 mg weekly  Please continue:  Lantus 35 units 2x a day.  Please decrease:  Humalog: - 10 units before a smaller meal - 20 units before a regular meal - 30 units before a larger meal or if you have dessert.   Do not take any Humalog at bedtime unless sugars > 300 (+ 5 units), or > 400 (+ 10 units).  Please return in 3 months with your sugar log.   - today, HbA1c is 6.8% (higher) - continue checking sugars at different times of the day - check 3x a day, rotating checks - advised for yearly eye exams >> he is UTD - Return to clinic in 3 mo with sugar log   2. HL - high TG on last lipid panel - continue Lipitor - improving his insulin resistance will help   [name], MD PhD [facility] Endocrinology

## 2016-11-03 NOTE — Progress Notes (Deleted)
3 MONTH FOLLOW UP   Assessment and Plan:  Essential hypertension - continue medications, DASH diet, exercise and monitor at home. Call if greater than 130/80.  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   CKD (chronic kidney disease) stage 2, GFR 60-89 ml/min Increase fluids, avoid NSAIDS, monitor sugars, will monitor - BASIC METABOLIC PANEL WITH GFR  Obesity (BMI 33) Obesity with co morbidities- long discussion about weight loss, diet, and exercise - Testosterone  Hyperlipidemia -continue medications, check lipids, decrease fatty foods, increase activity.  - Lipid panel   Vitamin D deficiency - Vit D  25 hydroxy (rtn osteoporosis monitoring)  Ascending aorta aneurysm Control blood pressure, cholesterol, glucose, increase exercise.  Continue cardio follow up   Discussed med's effects and SE's. Screening labs and tests as requested with regular follow-up as recommended. Future Appointments Date Time Provider [place]  11/04/2016 4:30 PM Vicie Mutters, PA-C [facility] None  01/26/2017 3:30 PM [name], MD [facility] None  06/03/2017 3:00 PM Vicie Mutters, PA-C [facility] None    HPI Patient presents for 3 month follow up for HTN, chol, DM with CKD, AAA.   His blood pressure has not been checked at home, today their BP is   He does not workout, but has been building a fence that is strenuous work. He denies chest pain, shortness of breath, dizziness, shortness breath.  Had normal stress test and echo with Dr. 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   He is  on cholesterol medication, lipitor 40mg  and denies myalgias. His cholesterol is at goal. 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, he has started back on his metformin 2 a day but having diarrhea, and is still on latus 35 units BID, and on humolog 12-20 units before meals depending on size, denies any low blood sugars, lowest is 60, normally not taking any humolog at bed time and denies paresthesia of the feet, polydipsia, polyuria and visual disturbances. Last A1C in the office was:  Lab Results  Component Value Date   HGBA1C 6.8 10/22/2016  Patient is on Vitamin D supplement.   Lab Results  Component Value Date   VD25OH 36 06/02/2016   BMI is There is no height or weight on file to calculate BMI., he is working on diet and exercise. Due to his obesity he has OSA and is on CPAP.  Wt Readings from Last 3 Encounters:  10/22/16 269 lb (122 kg)  09/19/16 269 lb 6.4 oz (122.2 kg)  09/10/16 267 lb 6.4 oz (121.3 kg)     Current Medications:  Current Outpatient Prescriptions on File Prior to Visit  Medication Sig Dispense Refill  . aspirin 81 MG tablet Take 81 mg by mouth daily.    Marland Kitchen  atorvastatin (LIPITOR) 40 MG tablet TAKE 1 TABLET DAILY FOR CHOLESTEROL 90 tablet 0  . BD INSULIN SYRINGE ULTRAFINE 31G X 5/16" 0.5 ML MISC USE TWICE A DAY AS DIRECTED 200 each 49  . bisoprolol-hydrochlorothiazide (ZIAC) 5-6.25 MG tablet TAKE 1 TABLET DAILY FOR BLOOD PRESSURE 90 tablet 1  . cholecalciferol (VITAMIN D) 1000 units tablet Take 1,000 Units by mouth daily.    . Dulaglutide (TRULICITY) 1.5 OE/4.2PN SOPN Inject 1.5 mg weekly. 12 pen 0  .  FLUCELVAX QUADRIVALENT 0.5 ML SUSY inject 0.5 milliliter intramuscularly  0  . glucose 4 GM chewable tablet Chew 1 tablet (4 g total) by mouth as needed for low blood sugar. 30 tablet 12  . insulin glargine (LANTUS) 100 UNIT/ML injection INJECT 35 UNITS UNDER THE SKIN 2x a day 60 mL 4  . insulin lispro (HUMALOG) 100 UNIT/ML injection INJECT 60 TO 80 UNITS UNDER THE SKIN PER DAY WITH MEALS DEPENDING ON MEALS 210 mL 4  . losartan-hydrochlorothiazide (HYZAAR) 100-12.5 MG tablet TAKE 1 TABLET DAILY 90 tablet 1  . metFORMIN (GLUCOPHAGE-XR) 500 MG 24 hr tablet Take 2 tablets 2 x / day with food for Diabetes (Patient not taking: Reported on 10/22/2016) 360 tablet 3  . omeprazole (PRILOSEC) 20 MG capsule TAKE 1 CAPSULE DAILY FOR ACID REFLUX 90 capsule 0  . ONE TOUCH ULTRA TEST test strip USE 3 TO 4 TIMES DAILY FOR FLUCTUATING BLOOD SUGARS 400 each 3  . potassium chloride SA (K-DUR,KLOR-CON) 20 MEQ tablet TAKE 1 TABLET TWICE A DAY 180 tablet 0  . tadalafil (CIALIS) 20 MG tablet Take 1 tablet (20 mg total) by mouth daily as needed for erectile dysfunction. 60 tablet 3   No current facility-administered medications on file prior to visit.     Allergies:  Allergies  Allergen Reactions  . Crestor [Rosuvastatin]    Medical History:  Past Medical History:  Diagnosis Date  . CKD (chronic kidney disease) stage 2, GFR 60-89 ml/min    secondary to DM  . Diabetes mellitus without complication ([place]) 3614   insulin requiring  . GERD (gastroesophageal reflux disease)   . Hyperlipidemia   . Hypertension   . Hypokalemia   . Hypomagnesemia   . OSA on CPAP    10-12 years ago  . Vitamin D deficiency    Surgical History:  Past Surgical History:  Procedure Laterality Date  . ANAL FISSURE REPAIR  2007  . VENTRAL HERNIA REPAIR  04/13/12   Dr. Johney Maine, ventral hernia repair w/ mesh   Family History:  Family History  Problem Relation Age of Onset  . Cancer Father        kidney   Social History:   Social  History  Substance Use Topics  . Smoking status: Never Smoker  . Smokeless tobacco: Never Used  . Alcohol use 0.0 oz/week    2 - 5 Cans of beer per week     Comment: weekly   Review of Systems:  Review of Systems  Constitutional: Negative.   HENT: Negative.   Eyes: Negative.   Respiratory: Negative for cough, hemoptysis, sputum production, shortness of breath and wheezing.   Cardiovascular: Negative.   Gastrointestinal: Negative.  Heartburn: better with PPI.  Genitourinary: Negative.   Musculoskeletal: Negative.   Skin: Negative.   Neurological: Negative.   Endo/Heme/Allergies: Negative.   Psychiatric/Behavioral: Negative.     Physical Exam: Estimated body mass index is 34.54 kg/m as calculated from the following:   Height as of 10/22/16: 6\' 2"  (1.88  m).   Weight as of 10/22/16: 269 lb (122 kg). There were no vitals taken for this visit. General Appearance: Well nourished, in no apparent distress.  Eyes: PERRLA, EOMs, conjunctiva no swelling or erythema, normal fundi and vessels.  Sinuses: No Frontal/maxillary tenderness  ENT/Mouth: Ext aud canals clear, normal light reflex with TMs without erythema, bulging. Good dentition. No erythema, swelling, or exudate on post pharynx. Tonsils not swollen or erythematous. Hearing normal.  Neck: Supple, thyroid normal. No bruits  Respiratory: Respiratory effort normal, BS equal bilaterally without rales, rhonchi, wheezing or stridor.  Cardio: RRR without murmurs, rubs or gallops, decreased HS due to body habitus. Brisk peripheral pulses without edema.  Chest: symmetric, with normal excursions and percussion.  Abdomen: Soft, nontender, obese, + ventral hernia, no guarding, rebound, masses, or organomegaly.  Lymphatics: Non tender without lymphadenopathy.  Musculoskeletal: Full ROM all peripheral extremities,5/5 strength, and normal gait.  Skin: Warm, dry without rashes, lesions, ecchymosis. Neuro: Cranial nerves intact, reflexes equal  bilaterally. Normal muscle tone, no cerebellar symptoms. Sensation intact.  Psych: Awake and oriented X 3, normal affect, Insight and Judgment appropriate.   Vicie Mutters 9:04 PM [facility]

## 2016-11-04 ENCOUNTER — Telehealth: Payer: Self-pay | Admitting: Internal Medicine

## 2016-11-04 ENCOUNTER — Ambulatory Visit: Payer: Self-pay | Admitting: Physician Assistant

## 2016-11-04 DIAGNOSIS — R197 Diarrhea, unspecified: Secondary | ICD-10-CM

## 2016-11-04 NOTE — Telephone Encounter (Signed)
Patient requests referral to Dr Cristina Gong for consistent diarrhea for months.

## 2016-11-05 NOTE — Addendum Note (Signed)
Addended by: Vicie Mutters R on: 11/05/2016 07:54 AM   Modules accepted: Orders

## 2016-12-02 ENCOUNTER — Other Ambulatory Visit: Payer: Self-pay | Admitting: Physician Assistant

## 2016-12-02 ENCOUNTER — Other Ambulatory Visit: Payer: Self-pay | Admitting: Internal Medicine

## 2016-12-07 ENCOUNTER — Other Ambulatory Visit: Payer: Self-pay | Admitting: Internal Medicine

## 2016-12-17 ENCOUNTER — Other Ambulatory Visit: Payer: Self-pay | Admitting: Physician Assistant

## 2016-12-17 ENCOUNTER — Encounter: Payer: Self-pay | Admitting: Internal Medicine

## 2016-12-17 DIAGNOSIS — R197 Diarrhea, unspecified: Secondary | ICD-10-CM

## 2016-12-17 DIAGNOSIS — R1032 Left lower quadrant pain: Secondary | ICD-10-CM

## 2016-12-19 ENCOUNTER — Ambulatory Visit
Admission: RE | Admit: 2016-12-19 | Discharge: 2016-12-19 | Disposition: A | Discharge status: Home / Self Care | Payer: 59 | Source: Ambulatory Visit | Attending: Physician Assistant | Admitting: Physician Assistant

## 2016-12-19 ENCOUNTER — Ambulatory Visit (INDEPENDENT_AMBULATORY_CARE_PROVIDER_SITE_OTHER): Payer: 59 | Admitting: Physician Assistant

## 2016-12-19 ENCOUNTER — Encounter: Payer: Self-pay | Admitting: Physician Assistant

## 2016-12-19 VITALS — BP 134/80 | HR 66 | Temp 97.3°F | Resp 14 | Ht 74.0 in | Wt 260.0 lb

## 2016-12-19 DIAGNOSIS — N182 Chronic kidney disease, stage 2 (mild): Secondary | ICD-10-CM | POA: Diagnosis not present

## 2016-12-19 DIAGNOSIS — E785 Hyperlipidemia, unspecified: Secondary | ICD-10-CM | POA: Diagnosis not present

## 2016-12-19 DIAGNOSIS — N521 Erectile dysfunction due to diseases classified elsewhere: Secondary | ICD-10-CM

## 2016-12-19 DIAGNOSIS — Z23 Encounter for immunization: Secondary | ICD-10-CM | POA: Diagnosis not present

## 2016-12-19 DIAGNOSIS — E1121 Type 2 diabetes mellitus with diabetic nephropathy: Secondary | ICD-10-CM

## 2016-12-19 DIAGNOSIS — E1169 Type 2 diabetes mellitus with other specified complication: Secondary | ICD-10-CM

## 2016-12-19 DIAGNOSIS — I7121 Aneurysm of the ascending aorta, without rupture: Secondary | ICD-10-CM

## 2016-12-19 DIAGNOSIS — I10 Essential (primary) hypertension: Secondary | ICD-10-CM | POA: Diagnosis not present

## 2016-12-19 DIAGNOSIS — E1122 Type 2 diabetes mellitus with diabetic chronic kidney disease: Secondary | ICD-10-CM | POA: Diagnosis not present

## 2016-12-19 DIAGNOSIS — Z79899 Other long term (current) drug therapy: Secondary | ICD-10-CM

## 2016-12-19 DIAGNOSIS — IMO0002 Reserved for concepts with insufficient information to code with codable children: Secondary | ICD-10-CM

## 2016-12-19 DIAGNOSIS — E1165 Type 2 diabetes mellitus with hyperglycemia: Secondary | ICD-10-CM | POA: Diagnosis not present

## 2016-12-19 DIAGNOSIS — R197 Diarrhea, unspecified: Secondary | ICD-10-CM

## 2016-12-19 DIAGNOSIS — R1032 Left lower quadrant pain: Secondary | ICD-10-CM

## 2016-12-19 DIAGNOSIS — E662 Morbid (severe) obesity with alveolar hypoventilation: Secondary | ICD-10-CM | POA: Diagnosis not present

## 2016-12-19 DIAGNOSIS — I712 Thoracic aortic aneurysm, without rupture: Secondary | ICD-10-CM | POA: Diagnosis not present

## 2016-12-19 LAB — HEPATIC FUNCTION PANEL
AG RATIO: 1.5 (calc) (ref 1.0–2.5)
ALBUMIN MSPROF: 4 g/dL (ref 3.6–5.1)
ALT: 14 U/L (ref 9–46)
AST: 16 U/L (ref 10–35)
Alkaline phosphatase (APISO): 88 U/L (ref 40–115)
BILIRUBIN INDIRECT: 0.9 mg/dL (ref 0.2–1.2)
Bilirubin, Direct: 0.2 mg/dL (ref 0.0–0.2)
GLOBULIN: 2.7 g/dL (ref 1.9–3.7)
TOTAL PROTEIN: 6.7 g/dL (ref 6.1–8.1)
Total Bilirubin: 1.1 mg/dL (ref 0.2–1.2)

## 2016-12-19 LAB — LIPID PANEL
CHOL/HDL RATIO: 3.1 (calc) (ref <5.0)
CHOLESTEROL: 144 mg/dL (ref <200)
HDL: 47 mg/dL (ref >40)
LDL CHOLESTEROL (CALC): 74 mg/dL
Non-HDL Cholesterol (Calc): 97 mg/dL (calc) (ref <130)
TRIGLYCERIDES: 151 mg/dL — AB (ref <150)

## 2016-12-19 LAB — CBC WITH DIFFERENTIAL/PLATELET
BASOS ABS: 83 {cells}/uL (ref 0–200)
Basophils Relative: 1.3 %
EOS ABS: 179 {cells}/uL (ref 15–500)
Eosinophils Relative: 2.8 %
HCT: 47.5 % (ref 38.5–50.0)
Hemoglobin: 16.3 g/dL (ref 13.2–17.1)
Lymphs Abs: 1395 cells/uL (ref 850–3900)
MCH: 28.2 pg (ref 27.0–33.0)
MCHC: 34.3 g/dL (ref 32.0–36.0)
MCV: 82.3 fL (ref 80.0–100.0)
MONOS PCT: 10 %
MPV: 10.5 fL (ref 7.5–12.5)
Neutro Abs: 4102 cells/uL (ref 1500–7800)
Neutrophils Relative %: 64.1 %
PLATELETS: 218 10*3/uL (ref 140–400)
RBC: 5.77 10*6/uL (ref 4.20–5.80)
RDW: 13 % (ref 11.0–15.0)
TOTAL LYMPHOCYTE: 21.8 %
WBC: 6.4 10*3/uL (ref 3.8–10.8)
WBCMIX: 640 {cells}/uL (ref 200–950)

## 2016-12-19 LAB — BASIC METABOLIC PANEL WITH GFR
BUN: 12 mg/dL (ref 7–25)
CO2: 32 mmol/L (ref 20–32)
CREATININE: 1.02 mg/dL (ref 0.70–1.25)
Calcium: 9.3 mg/dL (ref 8.6–10.3)
Chloride: 100 mmol/L (ref 98–110)
GFR, Est African American: 91 mL/min/{1.73_m2} (ref >=60)
GFR, Est Non African American: 78 mL/min/{1.73_m2} (ref >=60)
Glucose, Bld: 79 mg/dL (ref 65–99)
Potassium: 4 mmol/L (ref 3.5–5.3)
Sodium: 139 mmol/L (ref 135–146)

## 2016-12-19 LAB — TSH: TSH: 2.33 mIU/L (ref 0.40–4.50)

## 2016-12-19 LAB — MAGNESIUM: Magnesium: 2 mg/dL (ref 1.5–2.5)

## 2016-12-19 MED ORDER — IOPAMIDOL (ISOVUE-300) INJECTION 61%
125.0000 mL | Freq: Once | INTRAVENOUS | Status: AC | PRN
  Administered 2016-12-19: 125 mL via INTRAVENOUS

## 2016-12-19 NOTE — Patient Instructions (Signed)
Your A1C is a measure of your sugar over the past 3 months and is not affected by what you have eaten over the past few days. Diabetes increases your chances of stroke and heart attack over 300 % and is the leading cause of blindness and kidney failure in the [place]. Please make sure you decrease bad carbs like white bread, white rice, potatoes, corn, soft drinks, pasta, cereals, refined sugars, sweet tea, dried fruits, and fruit juice. Good carbs are okay to eat in moderation like sweet potatoes, brown rice, whole grain pasta/bread, most fruit (except dried fruit) and you can eat as many veggies as you want.   Greater than 6.5 is considered diabetic. Between 6.4 and 5.7 is prediabetic If your A1C is less than 5.7 you are NOT diabetic.  Targets for Glucose Readings: Time of Check Target for patients WITHOUT Diabetes Target for DIABETICS  Before Meals Less than 100  less than 150  Two hours after meals Less than 200  Less than 250       Bad carbs also include fruit juice, alcohol, and sweet tea. These are empty calories that do not signal to your brain that you are full.   Please remember the good carbs are still carbs which convert into sugar. So please measure them out no more than 1/2-1 cup of rice, oatmeal, pasta, and beans  Veggies are however free foods! Pile them on.   Not all fruit is created equal. Please see the list below, the fruit at the bottom is higher in sugars than the fruit at the top. Please avoid all dried fruits.     

## 2016-12-19 NOTE — Progress Notes (Signed)
3 MONTH FOLLOW UP   Assessment and Plan:     Discussed med's effects and SE's. Screening labs and tests as requested with regular follow-up as recommended. Future Appointments Date Time Provider [place]  12/19/2016 10:15 AM Vicie Mutters, PA-C [facility] None  12/19/2016 3:40 PM [facility] 1 [facility] [address] [facility]  01/26/2017 3:30 PM [name], MD [facility] None  06/03/2017 3:00 PM Vicie Mutters, PA-C [facility] None    HPI Patient presents for 3 month follow up for HTN, chol, DM with CKD, AAA.   His blood pressure has not been checked at home, today their BP is BP: 134/80 He does not workout. He denies chest pain, shortness of breath, dizziness, shortness breath.  Had normal stress test and echo with Dr. 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   He has been having diarrhea and following with Dr. Cristina Gong, having some LLQ pain as well and getting CT scan today.  He is on cholesterol medication, lipitor 40mg  and denies myalgias. His cholesterol is at goal. 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, Dr. Gershon Crane eye exam 02/18/2016, and is still on latus 35 units BID, and on humolog 12-20 units before meals depending on size, denies any low blood sugars, normally not taking any humolog at bed time and denies paresthesia of the feet, polydipsia, polyuria and visual disturbances. Last A1C in the office was:  Lab Results  Component Value Date   HGBA1C 6.8 10/22/2016   Patient is on Vitamin D supplement.   Lab Results  Component Value Date   VD25OH 36 06/02/2016   BMI is  Body mass index is 33.38 kg/m., he is working on diet and exercise. Due to his obesity he has OSA and is on CPAP.  Wt Readings from Last 3 Encounters:  12/19/16 260 lb (117.9 kg)  10/22/16 269 lb (122 kg)  09/19/16 269 lb 6.4 oz (122.2 kg)     Current Medications:  Current Outpatient Prescriptions on File Prior to Visit  Medication Sig Dispense Refill  . aspirin 81 MG tablet Take 81 mg by mouth daily.    Marland Kitchen atorvastatin (LIPITOR) 40 MG tablet TAKE 1 TABLET DAILY FOR CHOLESTEROL 90 tablet 0  . BD INSULIN SYRINGE ULTRAFINE 31G X 5/16" 0.5 ML MISC USE TWICE A DAY AS DIRECTED 200 each 49  . bisoprolol-hydrochlorothiazide (ZIAC) 5-6.25 MG tablet TAKE 1 TABLET DAILY FOR BLOOD PRESSURE 90 tablet 1  . cholecalciferol (VITAMIN D) 1000 units tablet Take 1,000 Units by mouth daily.    Marland Kitchen FLUCELVAX QUADRIVALENT 0.5 ML SUSY inject 0.5 milliliter intramuscularly  0  . glucose 4 GM chewable tablet Chew 1 tablet (4 g total) by mouth as needed for low blood sugar. 30 tablet 12  . insulin glargine (LANTUS) 100 UNIT/ML injection INJECT 35 UNITS UNDER THE SKIN 2x a day 60 mL 4  . insulin lispro (HUMALOG) 100 UNIT/ML injection INJECT 60 TO 80 UNITS UNDER THE SKIN PER DAY WITH MEALS DEPENDING ON MEALS 210 mL 4  . losartan-hydrochlorothiazide (  HYZAAR) 100-12.5 MG tablet TAKE 1 TABLET DAILY 90 tablet 1  . metFORMIN (GLUCOPHAGE-XR) 500 MG 24 hr tablet Take 2 tablets 2 x / day with food for Diabetes 360 tablet 3  . omeprazole (PRILOSEC) 20 MG capsule TAKE 1 CAPSULE DAILY FOR ACID REFLUX 90 capsule 0  . ONE TOUCH ULTRA TEST test strip USE 3 TO 4 TIMES DAILY FOR FLUCTUATING BLOOD SUGARS 400 each 3  . potassium chloride SA (K-DUR,KLOR-CON) 20 MEQ tablet TAKE 1 TABLET TWICE A DAY 180 tablet 1  . tadalafil (CIALIS) 20 MG tablet Take 1 tablet (20 mg total) by mouth daily as needed for erectile dysfunction. 60 tablet 3  . TRULICITY 1.5 ON/6.2XB SOPN INJECT 1.5 MG WEEKLY 6 mL 0   No current facility-administered medications  on file prior to visit.     Allergies:  Allergies  Allergen Reactions  . Crestor [Rosuvastatin]    Medical History:  Past Medical History:  Diagnosis Date  . CKD (chronic kidney disease) stage 2, GFR 60-89 ml/min    secondary to DM  . Diabetes mellitus without complication ([place]) 2841   insulin requiring  . GERD (gastroesophageal reflux disease)   . Hyperlipidemia   . Hypertension   . Hypokalemia   . Hypomagnesemia   . OSA on CPAP    10-12 years ago  . Vitamin D deficiency    Surgical History: reviewed and unchanged Family History: reviewed and unchanged Social History: reviewed and unchanged  Review of Systems:  Review of Systems  Constitutional: Negative.   HENT: Negative.   Eyes: Negative.   Respiratory: Negative for cough, hemoptysis, sputum production, shortness of breath and wheezing.   Cardiovascular: Negative.   Gastrointestinal: Positive for abdominal pain and diarrhea. Heartburn: better with PPI.  Genitourinary: Negative.   Musculoskeletal: Negative.   Skin: Negative.   Neurological: Negative.   Endo/Heme/Allergies: Negative.   Psychiatric/Behavioral: Negative.     Physical Exam: Estimated body mass index is 33.38 kg/m as calculated from the following:   Height as of this encounter: 6\' 2"  (1.88 m).   Weight as of this encounter: 260 lb (117.9 kg). BP 134/80   Pulse 66   Temp (!) 97.3 F (36.3 C)   Resp 14   Ht 6\' 2"  (1.88 m)   Wt 260 lb (117.9 kg)   SpO2 99%   BMI 33.38 kg/m  General Appearance: Well nourished, in no apparent distress.  Eyes: PERRLA, EOMs, conjunctiva no swelling or erythema, normal fundi and vessels.  Sinuses: No Frontal/maxillary tenderness  ENT/Mouth: Ext aud canals clear, normal light reflex with TMs without erythema, bulging. Good dentition. No erythema, swelling, or exudate on post pharynx. Tonsils not swollen or erythematous. Hearing normal.  Neck: Supple, thyroid normal. No bruits  Respiratory: Respiratory effort  normal, BS equal bilaterally without rales, rhonchi, wheezing or stridor.  Cardio: RRR without murmurs, rubs or gallops, decreased HS due to body habitus. Brisk peripheral pulses without edema.  Chest: symmetric, with normal excursions and percussion.  Abdomen: Soft, nontender, obese, + ventral hernia, no guarding, rebound, masses, or organomegaly.  Lymphatics: Non tender without lymphadenopathy.  Musculoskeletal: Full ROM all peripheral extremities,5/5 strength, and normal gait.  Skin: Warm, dry without rashes, lesions, ecchymosis. Neuro: Cranial nerves intact, reflexes equal bilaterally. Normal muscle tone, no cerebellar symptoms. Sensation intact.  Psych: Awake and oriented X 3, normal affect, Insight and Judgment appropriate.   Vicie Mutters 10:13 AM [facility]

## 2017-01-04 ENCOUNTER — Other Ambulatory Visit: Payer: Self-pay | Admitting: Internal Medicine

## 2017-01-15 ENCOUNTER — Encounter: Payer: Self-pay | Admitting: Internal Medicine

## 2017-01-22 ENCOUNTER — Other Ambulatory Visit: Payer: Self-pay | Admitting: Internal Medicine

## 2017-01-26 ENCOUNTER — Ambulatory Visit: Payer: 59 | Admitting: Internal Medicine

## 2017-01-26 ENCOUNTER — Encounter: Payer: Self-pay | Admitting: Internal Medicine

## 2017-01-26 VITALS — BP 106/70 | HR 75 | Wt 265.2 lb

## 2017-01-26 DIAGNOSIS — E669 Obesity, unspecified: Secondary | ICD-10-CM

## 2017-01-26 DIAGNOSIS — E1165 Type 2 diabetes mellitus with hyperglycemia: Secondary | ICD-10-CM | POA: Diagnosis not present

## 2017-01-26 DIAGNOSIS — E1121 Type 2 diabetes mellitus with diabetic nephropathy: Secondary | ICD-10-CM | POA: Diagnosis not present

## 2017-01-26 DIAGNOSIS — E785 Hyperlipidemia, unspecified: Secondary | ICD-10-CM | POA: Diagnosis not present

## 2017-01-26 DIAGNOSIS — IMO0002 Reserved for concepts with insufficient information to code with codable children: Secondary | ICD-10-CM

## 2017-01-26 LAB — POCT GLYCOSYLATED HEMOGLOBIN (HGB A1C): HEMOGLOBIN A1C: 6.7

## 2017-01-26 NOTE — Progress Notes (Signed)
Patient ID: Harry [name], male   DOB: 1955-07-07, 61 y.o.   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 Last visit 3 mo ago. He saw Dr. Electa Sniff before.  His diarrhea >> stopped after stopping Glucerna.  Last hemoglobin A1c was: Lab Results  Component Value Date   HGBA1C 6.8 10/22/2016   HGBA1C 6.5 07/11/2016   HGBA1C 6.8 03/13/2016   Component     Latest Ref Rng & Units 07/11/2016  Hemoglobin A1C      6.5  Glutamic Acid Decarb Ab     <5 IU/mL 47 (H)  Pancreatic Islet Cell Antibody     <5 JDF Units <5  C-Peptide     0.80 - 3.85 ng/mL 0.37 (L)  Glucose, Fasting     65 - 99 mg/dL 104 (H)   Elevated GAD antibodies and decreased insulin production. 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  Trulicity 1.5 mg weekly  Lantus 35 units 2x a day.  Humalog: - 10 units before a smaller meal - 20 units before a regular meal - 30 units before a larger meal or if you have dessert.   Do not take any Humalog at bedtime unless sugars > 300 (+ 5 units), or > 400 (+ 10 units).  Pt checks his sugars 2x a day >> better: - am: 59-345 (high when forgets insulin) >> 100-234 >> 125-150 - 2h after b'fast: 180-313 >> n/c >> 165 >> n/c - before lunch: (L: 25 units) >> 164-325 >> n/c - 2h after lunch: n/c >> 354, 362 >> 72 >> n/c - before dinner: 268 >> n/c (D: 25 units) >> n/c - 2h after dinner: 117, 169-440 >>> n/c  - bedtime: 61-401 >>55, 85-434 >> 180-285 >> 110-200 (snacks) - nighttime: n/c >>> 66 >> n/c Lowest sugar was 39 (at night; after exercise) >> 30s  ... >> 70 >> 65; he has hypoglycemia awareness at 60s-70s.  Highest sugar was in the 400s >> upper 200s >>  320.  Glucometer: one touch ultra 2  Pt's meals are: - Breakfast: Glucerna, banana - Lunch: eats out - salad with chicken; chicken burrito; sandwich; [employer] - Dinner: meat + veggie + starch - Snacks: potato chips, fruit, icecream No sodas.  - + mild CKD, last BUN/creatinine:  Lab Results  Component Value Date   BUN 12 12/19/2016   BUN 15 06/02/2016   CREATININE 1.02 12/19/2016   CREATININE 1.24 06/02/2016  On Losartan - + HL; last set of lipids: Lab Results  Component Value Date   CHOL 144 12/19/2016   HDL 47 12/19/2016   LDLCALC 50 06/02/2016   TRIG 151 (H) 12/19/2016   CHOLHDL 3.1 12/19/2016  On Lipitor - last eye exam was 02/2016 >> No DR - Dr. Gershon Crane - No numbness and tingling in his feet.  He also has a history of HTN, HL, GERD.  ROS: Constitutional: no weight gain/no weight loss, no fatigue, no subjective hyperthermia, no subjective hypothermia Eyes: no blurry vision, no xerophthalmia ENT: no sore throat, no nodules palpated in throat, no dysphagia, no odynophagia, no hoarseness Cardiovascular: no CP/no SOB/no palpitations/no leg swelling Respiratory:  no cough/no SOB/no wheezing Gastrointestinal: no N/no V/no D/no C/no acid reflux Musculoskeletal: no muscle aches/no joint aches Skin: no rashes, no hair loss Neurological: no tremors/no numbness/no tingling/no dizziness  I reviewed pt's medications, allergies, PMH, social hx, family hx, and changes were documented in the history of present illness. Otherwise, unchanged from my initial visit note.  Past Medical History:  Diagnosis Date  . CKD (chronic kidney disease) stage 2, GFR 60-89 ml/min    secondary to DM  . Diabetes mellitus without complication ([place]) 3976   insulin requiring  . GERD (gastroesophageal reflux disease)   . Hyperlipidemia   . Hypertension   . Hypokalemia   . Hypomagnesemia   . OSA on CPAP    10-12 years ago  . Vitamin D deficiency    Past Surgical History:  Procedure  Laterality Date  . ANAL FISSURE REPAIR  2007  . VENTRAL HERNIA REPAIR  04/13/12   Dr. Johney Maine, ventral hernia repair w/ mesh   Social History   Social History  . Marital status: Married    Spouse name: N/A  . Number of children: 2   Occupational History  . estimator   Social History Main Topics  . Smoking status: Never Smoker  . Smokeless tobacco: Not on file  . Alcohol use 0.0 oz/week    2 - 5 Cans of beer per week   Current Outpatient Medications on File Prior to Visit  Medication Sig Dispense Refill  . aspirin 81 MG tablet Take 81 mg by mouth daily.    Marland Kitchen atorvastatin (LIPITOR) 40 MG tablet TAKE 1 TABLET DAILY FOR CHOLESTEROL 90 tablet 0  . BD INSULIN SYRINGE ULTRAFINE 31G X 5/16" 0.5 ML MISC USE TWICE A DAY AS DIRECTED 200 each 49  . bisoprolol-hydrochlorothiazide (ZIAC) 5-6.25 MG tablet TAKE 1 TABLET DAILY FOR BLOOD PRESSURE 90 tablet 1  . cholecalciferol (VITAMIN D) 1000 units tablet Take 1,000 Units by mouth daily.    Marland Kitchen FLUCELVAX QUADRIVALENT 0.5 ML SUSY inject 0.5 milliliter intramuscularly  0  . glucose 4 GM chewable tablet Chew 1 tablet (4 g total) by mouth as needed for low blood sugar. 30 tablet 12  . insulin lispro (HUMALOG) 100 UNIT/ML injection INJECT 60 TO 80 UNITS UNDER THE SKIN PER DAY WITH MEALS DEPENDING ON MEALS 210 mL 4  . LANTUS 100 UNIT/ML injection INJECT 60 UNITS UNDER THE SKIN AT BEDTIME 60 mL 4  . losartan-hydrochlorothiazide (HYZAAR) 100-12.5 MG tablet TAKE 1 TABLET DAILY 90 tablet 1  . metFORMIN (GLUCOPHAGE-XR) 500 MG 24 hr tablet Take 2 tablets 2 x / day with food for Diabetes 360 tablet 3  . omeprazole (PRILOSEC) 20 MG capsule TAKE 1 CAPSULE DAILY FOR ACID REFLUX 90 capsule 0  . ONE TOUCH ULTRA TEST test strip USE 3 TO 4 TIMES DAILY FOR FLUCTUATING BLOOD SUGARS 400 each 3  . potassium chloride SA (K-DUR,KLOR-CON) 20 MEQ tablet TAKE 1 TABLET TWICE A DAY 180 tablet 1  . tadalafil (CIALIS) 20 MG tablet Take 1 tablet (20 mg total) by mouth daily as needed  for erectile dysfunction. 60 tablet 3  . TRULICITY 1.5 BH/4.1PF SOPN INJECT 1.5 MG WEEKLY 6 mL 0   No current facility-administered medications on file prior to visit.    Allergies  Allergen Reactions  . Crestor [Rosuvastatin]    Family History  Problem Relation Age of Onset  . Cancer Father        kidney    PE: BP 106/70   Pulse 75  Wt 265 lb 3.2 oz (120.3 kg)   SpO2 94%   BMI 34.05 kg/m  Wt Readings from Last 3 Encounters:  01/26/17 265 lb 3.2 oz (120.3 kg)  12/19/16 260 lb (117.9 kg)  10/22/16 269 lb (122 kg)   Constitutional: Obese, in NAD Eyes: PERRLA, EOMI, no exophthalmos ENT: moist mucous membranes, no thyromegaly, no cervical lymphadenopathy Cardiovascular: RRR, No MRG Respiratory: CTA B Gastrointestinal: abdomen soft, NT, ND, BS+ Musculoskeletal: no deformities, strength intact in all 4 Skin: moist, warm, no rashes Neurological: no tremor with outstretched hands, DTR normal in all 4  ASSESSMENT: 1. LADA, insulin-dependent, uncontrolled, with complications - CKD stage 2 - ED  - he is interested in an insulin pump  - he has an elliptical machine at home  2. HL  3.  Obesity class I BMI Classification:  < 18.5 underweight   18.5-24.9 normal weight   25.0-29.9 overweight   30.0-34.9 class I obesity   35.0-39.9 class II obesity   ? 40.0 class III obesity   PLAN:  1. Patient with long-standing, uncontrolled diabetes, with slightly better control at this visit compared to before.  He initially had diarrhea and he had stopped metformin to see if it helped.  It did not, so he continues on metformin.  Since last visit, he realized that his diarrhea was caused by Glucerna, which was stopped after last visit. - at last visit, he had some low CBGs later in the day >> we decreased his mealtime Humalog insulin doses - At this visit, his sugars are better, but they are still high at bedtime due to snacking after dinner.  He occasionally uses a low dose of  Humalog with snacks.  Also, occasionally boluses a low dose of Humalog at bedtime if sugars are higher than 300.  He did not have any significant lows since last visit on the above regimen long-standing, uncontrolled DM, now with worse control after stopping Metformin (to see if his diarrhea improved >> it did not) > - he is interested in a pump >> he did not yet call his insurance to see if covered - I advised him to  to:  Patient Instructions  Please continue:  Metformin ER 1000 mg 2x a day with lunch and dinner.  Trulicity 1.5 mg weekly  Lantus 35 units 2x a day.  Humalog: - 10 units before a smaller meal - 20 units before a regular meal - 30 units before a larger meal or if you have dessert.   Do not take any Humalog at bedtime unless sugars > 300 (+ 5 units), or > 400 (+ 10 units).  Please return in 3 months with your sugar log.   - today, HbA1c is 6.7% (slightly lower) - continue checking sugars at different times of the day - check 3x a day, rotating checks - advised for yearly eye exams >> he is UTD - Return to clinic in 3 mo with sugar log    2. HL -High triglycerides on the last lipid panel, but LDL at goal -continue Lipitor >> No SEs  3.  Obesity class I - He was snacking less in the 2 months after he saw me last and he lost 9 pounds.  However, in the last month, he started to snack more and he gained 5 back.  - Discussed about the importance of decrease the amount of snacking   Harry Kingdom, MD PhD [facility]

## 2017-01-26 NOTE — Patient Instructions (Addendum)
Please continue:  Metformin ER 1000 mg 2x a day with lunch and dinner.  Trulicity 1.5 mg weekly  Lantus 35 units 2x a day.  Humalog: - 10 units before a smaller meal - 20 units before a regular meal - 30 units before a larger meal or if you have dessert.   Do not take any Humalog at bedtime unless sugars > 300 (+ 5 units), or > 400 (+ 10 units).  Please return in 3-4 months with your sugar log.

## 2017-01-26 NOTE — Addendum Note (Signed)
Addended by: Caprice Beaver T on: 01/26/2017 04:45 PM   Modules accepted: Orders

## 2017-03-12 ENCOUNTER — Other Ambulatory Visit: Payer: Self-pay | Admitting: Physician Assistant

## 2017-03-12 MED ORDER — TADALAFIL 20 MG PO TABS: 20.0000 mg | ORAL_TABLET | Freq: Every day | ORAL | 3 refills | Status: DC | PRN

## 2017-03-13 ENCOUNTER — Other Ambulatory Visit: Payer: Self-pay | Admitting: Physician Assistant

## 2017-03-23 ENCOUNTER — Ambulatory Visit: Payer: Self-pay | Admitting: Physician Assistant

## 2017-04-04 NOTE — Progress Notes (Signed)
3 MONTH FOLLOW UP   Assessment and Plan:  Essential hypertension - continue medications, DASH diet, exercise and monitor at home. Call if greater than 130/80.  -     CBC with Differential/Platelet -     BASIC METABOLIC PANEL WITH GFR -     Hepatic function panel -     TSH  Aneurysm, ascending aorta (HCC) Control blood pressure, cholesterol, glucose, increase exercise.   Obesity hypoventilation syndrome ([place])  Uncontrolled type 2 diabetes mellitus with diabetic nephropathy ([place]) Discussed general issues about diabetes pathophysiology and management., Educational material distributed., Suggested low cholesterol diet., Encouraged aerobic exercise., Discussed foot care., Reminded to get yearly retinal exam.  CKD stage 2 due to type 2 diabetes mellitus ([place]) Discussed general issues about diabetes pathophysiology and management., Educational material distributed., Suggested low cholesterol diet., Encouraged aerobic exercise., Discussed foot care., Reminded to get yearly retinal exam.  Erectile dysfunction associated with type 2 diabetes mellitus ([place]) Sugars doing better  Hyperlipidemia, unspecified hyperlipidemia type -continue medications, check lipids, decrease fatty foods, increase activity.  -     Lipid panel  Obesity with body mass index (BMI) of 30.0 to 39.9 - follow up 3 months for progress monitoring - increase veggies, decrease carbs - long discussion about weight loss, diet, and exercise  Medication management -     Magnesium  Discussed med's effects and SE's. Screening labs and tests as requested with regular follow-up as recommended. Future Appointments  Date Time Provider [place]  05/26/2017  4:15 PM [name], MD [id number] None  06/03/2017  3:00 PM Vicie Mutters, PA-C [id number] None    HPI Patient presents for 3 month follow up for HTN, chol, DM with CKD, AAA.   Has tooth pulled yesterday, on tylenol and had ASA.   His blood pressure has  not been checked at home, today their BP is BP: 118/66 He does not workout. He denies chest pain, shortness of breath, dizziness, shortness breath.  Had normal stress test and echo with Dr. Marigene Ehlers, has ascending aorta dilitation that is being monitored.   He is on cholesterol medication, lipitor 40mg  and denies myalgias. His cholesterol is at goal. The cholesterol last visit was:   Lab Results  Component Value Date   CHOL 144 12/19/2016   HDL 47 12/19/2016   LDLCALC 50 06/02/2016   TRIG 151 (H) 12/19/2016   CHOLHDL 3.1 12/19/2016   He has been working on diet and exercise for insulin dependent diabetes with CKD stage 2, he has had DM x 30 + years, he is on bASA, he is on ACE/ARB, he is checking his sugars at night, he is now following with [name], Dr. Gershon Crane eye exam and is still on lantus 35 units BID, and on humolog 20-30 units before meals depending on size, denies any low blood sugars, normally not taking any humolog at bed time and denies paresthesia of the feet, polydipsia, polyuria and visual disturbances. Last A1C in the office was:  Lab Results  Component Value Date   HGBA1C 6.7 01/26/2017   Patient is on Vitamin D supplement.   Lab Results  Component Value Date   VD25OH 36 06/02/2016   BMI is Body mass index is 33.9 kg/m., he is working on diet and exercise. Due to his obesity he has OSA and is on CPAP.  Wt Readings from Last 3 Encounters:  04/07/17 264 lb (119.7 kg)  01/26/17 265 lb 3.2 oz (120.3 kg)  12/19/16 260 lb (117.9 kg)  Current Medications:  Current Outpatient Medications on File Prior to Visit  Medication Sig Dispense Refill  . aspirin 81 MG tablet Take 81 mg by mouth daily.    Marland Kitchen atorvastatin (LIPITOR) 40 MG tablet TAKE 1 TABLET DAILY FOR CHOLESTEROL 90 tablet 0  . BD INSULIN SYRINGE ULTRAFINE 31G X 5/16" 0.5 ML MISC USE TWICE A DAY AS DIRECTED 200 each 49  . bisoprolol-hydrochlorothiazide (ZIAC) 5-6.25 MG tablet TAKE 1 TABLET DAILY FOR BLOOD  PRESSURE 90 tablet 1  . cholecalciferol (VITAMIN D) 1000 units tablet Take 1,000 Units by mouth daily.    Marland Kitchen FLUCELVAX QUADRIVALENT 0.5 ML SUSY inject 0.5 milliliter intramuscularly  0  . glucose 4 GM chewable tablet Chew 1 tablet (4 g total) by mouth as needed for low blood sugar. 30 tablet 12  . insulin lispro (HUMALOG) 100 UNIT/ML injection INJECT 60 TO 80 UNITS UNDER THE SKIN PER DAY WITH MEALS DEPENDING ON MEALS 210 mL 4  . LANTUS 100 UNIT/ML injection INJECT 60 UNITS UNDER THE SKIN AT BEDTIME 60 mL 4  . losartan-hydrochlorothiazide (HYZAAR) 100-12.5 MG tablet TAKE 1 TABLET DAILY 90 tablet 1  . metFORMIN (GLUCOPHAGE-XR) 500 MG 24 hr tablet Take 2 tablets 2 x / day with food for Diabetes 360 tablet 3  . omeprazole (PRILOSEC) 20 MG capsule TAKE 1 CAPSULE DAILY FOR ACID REFLUX 90 capsule 0  . ONE TOUCH ULTRA TEST test strip USE 3 TO 4 TIMES A DAY FOR FLUCTUATING BLOOD SUGARS 400 each 3  . potassium chloride SA (K-DUR,KLOR-CON) 20 MEQ tablet TAKE 1 TABLET TWICE A DAY 180 tablet 1  . tadalafil (CIALIS) 20 MG tablet Take 1 tablet (20 mg total) by mouth daily as needed for erectile dysfunction. 60 tablet 3  . TRULICITY 1.5 XH/3.7JI SOPN INJECT 1.5 MG WEEKLY 6 mL 0   No current facility-administered medications on file prior to visit.     Allergies:  Allergies  Allergen Reactions  . Crestor [Rosuvastatin]    Medical History:  Past Medical History:  Diagnosis Date  . CKD (chronic kidney disease) stage 2, GFR 60-89 ml/min    secondary to DM  . Diabetes mellitus without complication ([place]) 9678   insulin requiring  . GERD (gastroesophageal reflux disease)   . Hyperlipidemia   . Hypertension   . Hypokalemia   . Hypomagnesemia   . OSA on CPAP    10-12 years ago  . Vitamin D deficiency    Surgical History: reviewed and unchanged Family History: reviewed and unchanged Social History: reviewed and unchanged  Review of Systems:  Review of Systems  Constitutional: Negative.   HENT:  Negative.   Eyes: Negative.   Respiratory: Negative for cough, hemoptysis, sputum production, shortness of breath and wheezing.   Cardiovascular: Negative.   Gastrointestinal: Negative for abdominal pain and diarrhea. Heartburn: better with PPI.  Genitourinary: Negative.   Musculoskeletal: Negative.   Skin: Negative.   Neurological: Negative.   Endo/Heme/Allergies: Negative.   Psychiatric/Behavioral: Negative.     Physical Exam: Estimated body mass index is 33.9 kg/m as calculated from the following:   Height as of this encounter: 6\' 2"  (1.88 m).   Weight as of this encounter: 264 lb (119.7 kg). BP 118/66   Pulse (!) 57   Temp (!) 97.5 F (36.4 C)   Ht 6\' 2"  (1.88 m)   Wt 264 lb (119.7 kg)   SpO2 97%   BMI 33.90 kg/m  General Appearance: Well nourished, in no apparent distress.  Eyes: PERRLA, EOMs,  conjunctiva no swelling or erythema, normal fundi and vessels.  Sinuses: No Frontal/maxillary tenderness  ENT/Mouth: Ext aud canals clear, normal light reflex with TMs without erythema, bulging. Good dentition. No erythema, swelling, or exudate on post pharynx. Tonsils not swollen or erythematous. Hearing normal.  Neck: Supple, thyroid normal. No bruits  Respiratory: Respiratory effort normal, BS equal bilaterally without rales, rhonchi, wheezing or stridor.  Cardio: RRR without murmurs, rubs or gallops, decreased HS due to body habitus. Brisk peripheral pulses without edema.  Chest: symmetric, with normal excursions and percussion.  Abdomen: Soft, nontender, obese, + ventral hernia, no guarding, rebound, masses, or organomegaly.  Lymphatics: Non tender without lymphadenopathy.  Musculoskeletal: Full ROM all peripheral extremities,5/5 strength, and normal gait.  Skin: Warm, dry without rashes, lesions, ecchymosis. Neuro: Cranial nerves intact, reflexes equal bilaterally. Normal muscle tone, no cerebellar symptoms. Sensation intact.  Psych: Awake and oriented X 3, normal affect,  Insight and Judgment appropriate.   Vicie Mutters 4:19 PM [facility]

## 2017-04-07 ENCOUNTER — Encounter: Payer: Self-pay | Admitting: Physician Assistant

## 2017-04-07 ENCOUNTER — Ambulatory Visit: Payer: 59 | Admitting: Physician Assistant

## 2017-04-07 VITALS — BP 118/66 | HR 57 | Temp 97.5°F | Ht 74.0 in | Wt 264.0 lb

## 2017-04-07 DIAGNOSIS — IMO0002 Reserved for concepts with insufficient information to code with codable children: Secondary | ICD-10-CM

## 2017-04-07 DIAGNOSIS — E1165 Type 2 diabetes mellitus with hyperglycemia: Secondary | ICD-10-CM

## 2017-04-07 DIAGNOSIS — I712 Thoracic aortic aneurysm, without rupture: Secondary | ICD-10-CM

## 2017-04-07 DIAGNOSIS — E662 Morbid (severe) obesity with alveolar hypoventilation: Secondary | ICD-10-CM

## 2017-04-07 DIAGNOSIS — E1121 Type 2 diabetes mellitus with diabetic nephropathy: Secondary | ICD-10-CM

## 2017-04-07 DIAGNOSIS — I10 Essential (primary) hypertension: Secondary | ICD-10-CM

## 2017-04-07 DIAGNOSIS — Z79899 Other long term (current) drug therapy: Secondary | ICD-10-CM

## 2017-04-07 DIAGNOSIS — E1122 Type 2 diabetes mellitus with diabetic chronic kidney disease: Secondary | ICD-10-CM

## 2017-04-07 DIAGNOSIS — N182 Chronic kidney disease, stage 2 (mild): Secondary | ICD-10-CM

## 2017-04-07 DIAGNOSIS — E785 Hyperlipidemia, unspecified: Secondary | ICD-10-CM | POA: Diagnosis not present

## 2017-04-07 DIAGNOSIS — E669 Obesity, unspecified: Secondary | ICD-10-CM

## 2017-04-07 DIAGNOSIS — N521 Erectile dysfunction due to diseases classified elsewhere: Secondary | ICD-10-CM

## 2017-04-07 DIAGNOSIS — I7121 Aneurysm of the ascending aorta, without rupture: Secondary | ICD-10-CM

## 2017-04-07 DIAGNOSIS — E1169 Type 2 diabetes mellitus with other specified complication: Secondary | ICD-10-CM | POA: Diagnosis not present

## 2017-04-07 NOTE — Patient Instructions (Addendum)
You can take tylenol (500mg ) or tylenol arthritis (650mg ) with the meloxicam/antiinflammatories. The max you can take of tylenol a day is 3000mg  daily, this is a max of 6 pills a day of the regular tyelnol (500mg ) or a max of 4 a day of the tylenol arthritis (650mg ) as long as no other medications you are taking contain tylenol.   GETTING OFF OF PPI's    Nexium/protonix/prilosec/Omeprazole/Dexilant/Aciphex are called PPI's, they are great at healing your stomach but should only be taken for a short period of time.     Recent studies have shown that taken for a long time they  can increase the risk of osteoporosis (weakening of your bones), pneumonia, low magnesium, restless legs, Cdiff (infection that causes diarrhea), DEMENTIA and most recently kidney damage / disease / insufficiency.     Due to this information we want to try to stop the PPI but if you try to stop it abruptly this can cause rebound acid and worsening symptoms.   So this is how we want you to get off the PPI: Generic is always fine!!  - Start taking the nexium/protonix/prilosec/PPI  every other day with  zantac (ranitidine) OR pepcid famotadine 2 x a day for 2-4 weeks - some people stay on this dosage and can not taper off further. Our main goal is to limit the dosage and amount you are taking so if you need to stay on this dose.   - then decrease the PPI to every 3 days while taking the zantac or pepcid 300mg  twice a day the other  days for 2-4  Weeks  - then you can try the zantac or pepcid 300mg  once at night or up to 2 x day as needed.  - you can continue on this once at night or stop all together  - Avoid alcohol, spicy foods, NSAIDS (aleve, ibuprofen) at this time. See foods below.   +++++++++++++++++++++++++++++++++++++++++++  Food Choices for Gastroesophageal Reflux Disease  When you have gastroesophageal reflux disease (GERD), the foods you eat and your eating habits are very important. Choosing the right foods  can help ease the discomfort of GERD. WHAT GENERAL GUIDELINES DO I NEED TO FOLLOW?  Choose fruits, vegetables, whole grains, low-fat dairy products, and low-fat meat, fish, and poultry.  Limit fats such as oils, salad dressings, butter, nuts, and avocado.  Keep a food diary to identify foods that cause symptoms.  Avoid foods that cause reflux. These may be different for different people.  Eat frequent small meals instead of three large meals each day.  Eat your meals slowly, in a relaxed setting.  Limit fried foods.  Cook foods using methods other than frying.  Avoid drinking alcohol.  Avoid drinking large amounts of liquids with your meals.  Avoid bending over or lying down until 2-3 hours after eating.   WHAT FOODS ARE NOT RECOMMENDED? The following are some foods and drinks that may worsen your symptoms:  Vegetables Tomatoes. Tomato juice. Tomato and spaghetti sauce. Chili peppers. Onion and garlic. Horseradish. Fruits Oranges, grapefruit, and lemon (fruit and juice). Meats High-fat meats, fish, and poultry. This includes hot dogs, ribs, ham, sausage, salami, and bacon. Dairy Whole milk and chocolate milk. Sour cream. Cream. Butter. Ice cream. Cream cheese.  Beverages Coffee and tea, with or without caffeine. Carbonated beverages or energy drinks. Condiments Hot sauce. Barbecue sauce.  Sweets/Desserts Chocolate and cocoa. Donuts. Peppermint and spearmint. Fats and Oils High-fat foods, including [place] fries and potato chips. Other Vinegar. Strong  spices, such as black pepper, white pepper, red pepper, cayenne, curry powder, cloves, ginger, and chili powder.  8 Critical Weight-Loss Tips That Aren't Diet and Exercise  1. STARVE THE DISTRACTIONS  All too often when we eat, we're also multitasking: watching TV, answering emails, scrolling through social media. These habits are detrimental to having a strong, clear, healthy relationship with food, and they can hinder  our ability to make dietary changes.  In order to truly focus on what you're eating, how much you're eating, why you're eating those specific foods and, most importantly, how those foods make you feel, you need to starve the distractions. That means when you eat, just eat. Focus on your food, the process it went through to end up on your plate, where it came from and how it nourishes you. With this technique, you're more likely to finish a meal feeling satiated.  2.  CONSIDER WHAT YOU'RE NOT WILLING TO DO  This might sound counterintuitive, but it can help provide a "why" when motivation is waning. Declare, in writing, what you are unwilling to do, for example "I am unwilling to be the old dad who cannot play sports with my children".  So consider what you're not willing to accept, write it down, and keep it at the ready.  3.  STOP LABELING FOOD "GOOD" AND "BAD"  You've probably heard someone say they ate something "bad." Maybe you've even said it yourself.  The trouble with 'bad' foods isn't that they'll send you to the grave after a bite or two. The trouble comes when we eat excessive portions of really calorie-dense foods meal after meal, day after day.  Instead of labeling foods as good or bad, think about which foods you can eat a lot of, and which ones you should just eat a little of. Then, plan ways to eat the foods you really like in portions that fit with your overall goals. A good example of this would be having a slice of pizza alongside a club salad with chicken breast, avocado and a bit of dressing. This is vastly different than 3 slices of pizza, 4 breadsticks with cheese sauce and half of a liter of regular soda.  4.  BRUSH YOUR TEETH AFTER YOU EAT  Getting your mindset in order is important, but sometimes small habits can make a big difference. After eating, you still have the taste of food in their mouth, which often causes people to eat more even if they are full or engage in a  nibble or two of dessert.  Brushing your teeth will remove the taste of food from your mouth, and the clean, minty freshness will serve as a cue that mealtime is over.  5.  FOCUS ON CROWDING NOT CUTTING  The most common first step during 'dieting' is to cut. We cut our portion sizes down, we cut out 'bad' foods, we cut out entire food groups. This act of cutting puts [place] and our minds into scarcity mode.  When something is off-limits, even if you're able to avoid it for a while, you could end up bingeing on it later because you've gone so long without it. So, instead of cutting, focus on crowding. If you crowd your plate and fill it up with more foods like veggies and protein, it simply allows less room for the other stuff. In other words, shift your focus away from what you can't eat, and celebrate the foods that will help you reach your goals.  6.  TAKE TRACKING A STEP FURTHER  Track what you eat, when you ate it, how much you ate and how that food made you feel. Being completely honest with yourself and writing down every single thing that passes through your lips will help you start to notice that maybe you actually do snack, possibly take in more sugar than you thought, eat when you're bored rather than just hungry or maybe that you have a habit of snacking before bed while watching TV.  The difference from simply tracking your food intake is you're taking into account how food makes you feel, as well as what you're doing while you're eating. This is about becoming more mindful of what, when and why you eat.  7.  PRIORITIZE GOOD SLEEP  One of the strongest risk factors for being overweight is poor sleep. When you're feeling tired, you're more likely to choose unhealthy comfort foods and to skip your workout. Additionally, sleep deprivation may slow down your metabolism. [employer]! Therefore, sleeping 7-8 hours per night can help with weight loss without having to change your diet or increase your  physical activity. And if you feel you snore and still wake up tired, talk with me about sleep apnea.  8.  SET ASIDE TIME TO DISCONNECT  Just get out there. Disconnect from the electronics and connect to the elements. Not only will this help reduce stress (a major factor in weight gain) by giving your mind a break from the constant stimulation we've all become so accustomed to, but it may also reprogram your brain to connect with yourself and what you're feeling.

## 2017-04-08 LAB — CBC WITH DIFFERENTIAL/PLATELET
Basophils Absolute: 78 cells/uL (ref 0–200)
Basophils Relative: 1.4 %
EOS PCT: 4.1 %
Eosinophils Absolute: 230 cells/uL (ref 15–500)
HCT: 43 % (ref 38.5–50.0)
HEMOGLOBIN: 14.8 g/dL (ref 13.2–17.1)
Lymphs Abs: 1070 cells/uL (ref 850–3900)
MCH: 28.5 pg (ref 27.0–33.0)
MCHC: 34.4 g/dL (ref 32.0–36.0)
MCV: 82.9 fL (ref 80.0–100.0)
MONOS PCT: 10.9 %
MPV: 11.2 fL (ref 7.5–12.5)
NEUTROS ABS: 3612 {cells}/uL (ref 1500–7800)
Neutrophils Relative %: 64.5 %
PLATELETS: 191 10*3/uL (ref 140–400)
RBC: 5.19 10*6/uL (ref 4.20–5.80)
RDW: 12.9 % (ref 11.0–15.0)
TOTAL LYMPHOCYTE: 19.1 %
WBC mixed population: 610 cells/uL (ref 200–950)
WBC: 5.6 10*3/uL (ref 3.8–10.8)

## 2017-04-08 LAB — TSH: TSH: 2.33 m[IU]/L (ref 0.40–4.50)

## 2017-04-08 LAB — HEPATIC FUNCTION PANEL
AG Ratio: 1.5 (calc) (ref 1.0–2.5)
ALKALINE PHOSPHATASE (APISO): 73 U/L (ref 40–115)
ALT: 17 U/L (ref 9–46)
AST: 19 U/L (ref 10–35)
Albumin: 3.7 g/dL (ref 3.6–5.1)
BILIRUBIN DIRECT: 0.1 mg/dL (ref 0.0–0.2)
BILIRUBIN INDIRECT: 0.5 mg/dL (ref 0.2–1.2)
BILIRUBIN TOTAL: 0.6 mg/dL (ref 0.2–1.2)
Globulin: 2.4 g/dL (calc) (ref 1.9–3.7)
Total Protein: 6.1 g/dL (ref 6.1–8.1)

## 2017-04-08 LAB — BASIC METABOLIC PANEL WITH GFR
BUN: 17 mg/dL (ref 7–25)
CO2: 26 mmol/L (ref 20–32)
Calcium: 9.1 mg/dL (ref 8.6–10.3)
Chloride: 105 mmol/L (ref 98–110)
Creat: 1.09 mg/dL (ref 0.70–1.25)
GFR, EST AFRICAN AMERICAN: 83 mL/min/{1.73_m2} (ref >=60)
GFR, EST NON AFRICAN AMERICAN: 72 mL/min/{1.73_m2} (ref >=60)
Glucose, Bld: 92 mg/dL (ref 65–99)
Potassium: 3.6 mmol/L (ref 3.5–5.3)
SODIUM: 140 mmol/L (ref 135–146)

## 2017-04-08 LAB — LIPID PANEL
CHOL/HDL RATIO: 3.4 (calc) (ref <5.0)
Cholesterol: 144 mg/dL (ref <200)
HDL: 42 mg/dL (ref >40)
LDL Cholesterol (Calc): 70 mg/dL (calc)
NON-HDL CHOLESTEROL (CALC): 102 mg/dL (ref <130)
Triglycerides: 230 mg/dL — ABNORMAL HIGH (ref <150)

## 2017-04-08 LAB — MAGNESIUM: MAGNESIUM: 1.9 mg/dL (ref 1.5–2.5)

## 2017-04-15 ENCOUNTER — Other Ambulatory Visit: Payer: Self-pay | Admitting: Physician Assistant

## 2017-05-01 LAB — HM DIABETES EYE EXAM

## 2017-05-13 ENCOUNTER — Encounter: Payer: Self-pay | Admitting: Physician Assistant

## 2017-05-18 ENCOUNTER — Other Ambulatory Visit: Payer: Self-pay | Admitting: Physician Assistant

## 2017-05-18 ENCOUNTER — Other Ambulatory Visit: Payer: Self-pay | Admitting: Internal Medicine

## 2017-05-26 ENCOUNTER — Ambulatory Visit (INDEPENDENT_AMBULATORY_CARE_PROVIDER_SITE_OTHER): Payer: 59 | Admitting: Internal Medicine

## 2017-05-26 ENCOUNTER — Encounter: Payer: Self-pay | Admitting: Internal Medicine

## 2017-05-26 VITALS — BP 132/68 | HR 72 | Ht 74.0 in | Wt 264.0 lb

## 2017-05-26 DIAGNOSIS — E109 Type 1 diabetes mellitus without complications: Secondary | ICD-10-CM

## 2017-05-26 DIAGNOSIS — E669 Obesity, unspecified: Secondary | ICD-10-CM

## 2017-05-26 DIAGNOSIS — E139 Other specified diabetes mellitus without complications: Secondary | ICD-10-CM

## 2017-05-26 LAB — POCT GLYCOSYLATED HEMOGLOBIN (HGB A1C): Hemoglobin A1C: 7

## 2017-05-26 NOTE — Patient Instructions (Addendum)
Please continue:  Metformin ER 1000 mg 2x a day along with Lantus dose  Trulicity 1.5 mg weekly  Lantus 35 units 2x a day.  Humalog: - 10 units before a smaller meal - 20 units before a regular meal - 30 units before a larger meal or if you have dessert.   Do not take any Humalog at bedtime unless sugars > 300 (+ 5 units), or > 400 (+ 10 units).   May need to take a Humalog dose of 5-7 units with a snack at night.  Please return in 3-4 months with your sugar log.

## 2017-05-26 NOTE — Progress Notes (Signed)
Patient ID: Harry [name], male   DOB: 1956/02/27, 62 y.o.   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 Last visit 4 months ago.  Last hemoglobin A1c was: Lab Results  Component Value Date   HGBA1C 6.7 01/26/2017   HGBA1C 6.8 10/22/2016   HGBA1C 6.5 07/11/2016   He is on:  Metformin ER [phone] mg 2x a day with lunch and dinner.  Trulicity 1.5 mg weekly  Lantus 35 units 2x a day.  Humalog: - 10 units before a smaller meal - 20 units before a regular meal - 30 units before a larger meal or if you have dessert.   Do not take any Humalog at bedtime unless sugars >300 (+ 5 units), or > 400 (+ 10 units).  Pt checks his sugars 2x a day: - am: 100-234 >> 125-150 >> 85-140 - 2h after b'fast: 180-313 >> n/c >> 165 >> n/c - before lunch: (L: 25 units) >> 164-325 >> n/c - 2h after lunch: n/c >> 354, 362 >> 72 >> n/c - before dinner: 268 >> n/c (D: 25 units) >> n/c - 2h after dinner: 117, 169-440 >>> n/c  - bedtime: 55, 85-434 >> 180-285 >> 110-200 (snacks) >> 185-220 - nighttime: n/c >>> 66 >> n/c Lowest sugar was 39 (at night; after exercise) >> 30s  ... >> 65 >> 60; he has hypoglycemia awareness a in the 60s-70s. Highest sugar was in the 320 >> 280.  Glucometer: one touch ultra 2  Pt's meals are: - Breakfast: Glucerna, banana - Lunch: eats out - salad with chicken; chicken burrito; sandwich; [employer] - Dinner: meat + veggie + starch - Snacks: potato chips, fruit, icecream No sodas.  -+ Mild CKD, last BUN/creatinine:  Lab Results  Component Value Date   BUN 17 04/07/2017   BUN 12 12/19/2016   CREATININE 1.09 04/07/2017   CREATININE 1.02 12/19/2016  On losartan. -+ HL; last set of lipids: Lab Results  Component Value Date   CHOL 144 04/07/2017   HDL 42 04/07/2017   LDLCALC 70 04/07/2017    TRIG 230 (H) 04/07/2017   CHOLHDL 3.4 04/07/2017  On Lipitor. - last eye exam was in 05/2017: No DR - Dr. Gershon Crane - no numbness and tingling in his feet.  He also has a history of HTN, HL, GERD.  ROS: Constitutional: no weight gain/no weight loss, + fatigue, no subjective hyperthermia, no subjective hypothermia Eyes: no blurry vision, no xerophthalmia ENT: no sore throat, no nodules palpated in throat, no dysphagia, no odynophagia, no hoarseness Cardiovascular: no CP/no SOB/no palpitations/no leg swelling Respiratory: no cough/no SOB/no wheezing Gastrointestinal: no N/no V/no D/no C/no acid reflux Musculoskeletal: no muscle aches/no joint aches Skin: no rashes, no hair loss Neurological: no tremors/no numbness/no tingling/no dizziness  I reviewed pt's medications, allergies, PMH, social hx, family hx, and changes were documented in the history of present illness. Otherwise, unchanged from my initial visit note.  Past Medical History:  Diagnosis Date  . CKD (chronic kidney disease) stage 2, GFR 60-89 ml/min    secondary to DM  . Diabetes mellitus without complication ([place]) 8841   insulin requiring  . GERD (gastroesophageal reflux disease)   . Hyperlipidemia   . Hypertension   . Hypokalemia   . Hypomagnesemia   . OSA on CPAP    10-12  years ago  . Vitamin D deficiency    Past Surgical History:  Procedure Laterality Date  . ANAL FISSURE REPAIR  2007  . VENTRAL HERNIA REPAIR  04/13/12   Dr. Johney Maine, ventral hernia repair w/ mesh   Social History   Social History  . Marital status: Married    Spouse name: N/A  . Number of children: 2   Occupational History  . estimator   Social History Main Topics  . Smoking status: Never Smoker  . Smokeless tobacco: Not on file  . Alcohol use 0.0 oz/week    2 - 5 Cans of beer per week   Current Outpatient Medications on File Prior to Visit  Medication Sig Dispense Refill  . aspirin 81 MG tablet Take 81 mg by mouth daily.    Marland Kitchen  atorvastatin (LIPITOR) 40 MG tablet TAKE 1 TABLET DAILY FOR CHOLESTEROL 90 tablet 0  . BD INSULIN SYRINGE ULTRAFINE 31G X 5/16" 0.5 ML MISC USE TWICE A DAY AS DIRECTED 200 each 49  . bisoprolol-hydrochlorothiazide (ZIAC) 5-6.25 MG tablet TAKE 1 TABLET DAILY FOR BLOOD PRESSURE 90 tablet 1  . cholecalciferol (VITAMIN D) 1000 units tablet Take 1,000 Units by mouth daily.    Marland Kitchen glucose 4 GM chewable tablet Chew 1 tablet (4 g total) by mouth as needed for low blood sugar. 30 tablet 12  . insulin lispro (HUMALOG) 100 UNIT/ML injection INJECT 60 TO 80 UNITS UNDER THE SKIN PER DAY WITH MEALS DEPENDING ON MEALS 210 mL 4  . LANTUS 100 UNIT/ML injection INJECT 60 UNITS UNDER THE SKIN AT BEDTIME 60 mL 4  . losartan-hydrochlorothiazide (HYZAAR) 100-12.5 MG tablet TAKE 1 TABLET DAILY 90 tablet 1  . metFORMIN (GLUCOPHAGE-XR) 500 MG 24 hr tablet Take 2 tablets 2 x / day with food for Diabetes 360 tablet 3  . omeprazole (PRILOSEC) 20 MG capsule TAKE 1 CAPSULE DAILY FOR ACID REFLUX 90 capsule 0  . ONE TOUCH ULTRA TEST test strip USE 3 TO 4 TIMES A DAY FOR FLUCTUATING BLOOD SUGARS 400 each 3  . potassium chloride SA (K-DUR,KLOR-CON) 20 MEQ tablet TAKE 1 TABLET TWICE A DAY 180 tablet 1  . tadalafil (CIALIS) 20 MG tablet Take 1 tablet (20 mg total) by mouth daily as needed for erectile dysfunction. 60 tablet 3  . TRULICITY 1.5 LN/9.8XQ SOPN INJECT 1.5 MG WEEKLY 6 mL 0   No current facility-administered medications on file prior to visit.    Allergies  Allergen Reactions  . Crestor [Rosuvastatin]    Family History  Problem Relation Age of Onset  . Cancer Father        kidney    PE: BP 132/68   Pulse 72   Ht 6\' 2"  (1.88 m)   Wt 264 lb (119.7 kg)   SpO2 96%   BMI 33.90 kg/m  Wt Readings from Last 3 Encounters:  05/26/17 264 lb (119.7 kg)  04/07/17 264 lb (119.7 kg)  01/26/17 265 lb 3.2 oz (120.3 kg)   Constitutional: overweight, in NAD Eyes: PERRLA, EOMI, no exophthalmos ENT: moist mucous membranes,  no thyromegaly, no cervical lymphadenopathy Cardiovascular: RRR, No MRG Respiratory: CTA B Gastrointestinal: abdomen soft, NT, ND, BS+ Musculoskeletal: no deformities, strength intact in all 4 Skin: moist, warm, no rashes Neurological: no tremor with outstretched hands, DTR normal in all 4  ASSESSMENT: 1. LADA, insulin-dependent, uncontrolled, with complications - CKD stage 2 - ED  - he is interested in an insulin pump  - he has an elliptical machine at  home  Component     Latest Ref Rng & Units 07/11/2016  Hemoglobin A1C      6.5  Glutamic Acid Decarb Ab     <5 IU/mL 47 (H)  Pancreatic Islet Cell Antibody     <5 JDF Units <5  C-Peptide     0.80 - 3.85 ng/mL 0.37 (L)  Glucose, Fasting     65 - 99 mg/dL 104 (H)   Elevated GAD antibodies and decreased insulin production. 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 HL  3.  Obesity class I BMI Classification:  < 18.5 underweight   18.5-24.9 normal weight   25.0-29.9 overweight   30.0-34.9 class I obesity   35.0-39.9 class II obesity   ? 40.0 class III obesity   PLAN:  1. Patient with long-standing, uncontrolled, diabetes, with slightly better control at last visit compared to before.  His sugars were still high at bedtime at last visit due to snacking after dinner.  He occasionally uses a low dose of Humalog with snacks.  Also, occasionally boluses a low dose of Humalog at bedtime if sugars are higher than 200s, but I did advise him in the past only bolus if the sugars are higher than 300s.  I reinforced this today, to avoid low blood sugars overnight and in a.m. - We discussed that the best way to deal with high blood sugars at bedtime is to prevent them, rather than correcting them.  I advised him to try to bolus for a snack since he almost always have snacks after dinner.  I advised him to use 5-7 units in that case.   - He is  also telling me that he frequently misses the metformin and occasionally takes 500 mg instead of 1000 mg.  I advised him to try to take the metformin at the same time he takes Lantus since he does not forget this.  He will definitely try this. - he is interested in a pump >> he did not yet call his insurance to see if this is covered - I advised him to  to:  Patient Instructions  Please continue:  Metformin ER 1000 mg 2x a day with lunch and dinner.  Trulicity 1.5 mg weekly  Lantus 35 units 2x a day.  Humalog: - 10 units before a smaller meal - 20 units before a regular meal - 30 units before a larger meal or if you have dessert.   Do not take any Humalog at bedtime unless sugars > 300 (+ 5 units), or > 400 (+ 10 units).  Please return in 3-4 months with your sugar log.   - today, HbA1c is 7.0% (higher) - continue checking sugars at different times of the day - check 3-4x a day, rotating checks - advised for yearly eye exams >> he is UTD - Return to clinic in 3-4 mo with sugar log    2. HL -Reviewed latest lipid panel from 04/2017: LDL at goal, but high triglycerides  -continues Lipitor without side effects  3.  Obesity class I -In the past, he started to snack less and lost approximately 9 pounds in 2 months.  Since then, he restarted his snacking and did not lose weight since last visit -Again discussed about reducing snacking, but he tells me that this is not feasible for him   [name], MD PhD [facility]

## 2017-06-03 ENCOUNTER — Encounter: Payer: Self-pay | Admitting: Physician Assistant

## 2017-06-30 ENCOUNTER — Other Ambulatory Visit: Payer: Self-pay | Admitting: Internal Medicine

## 2017-06-30 ENCOUNTER — Other Ambulatory Visit: Payer: Self-pay | Admitting: Adult Health

## 2017-06-30 ENCOUNTER — Encounter: Payer: Self-pay | Admitting: Adult Health

## 2017-06-30 MED ORDER — PREDNISONE 20 MG PO TABS: ORAL_TABLET | ORAL | 0 refills | Status: DC

## 2017-06-30 MED ORDER — AZITHROMYCIN 250 MG PO TABS: ORAL_TABLET | ORAL | 1 refills | Status: AC

## 2017-07-08 ENCOUNTER — Other Ambulatory Visit: Payer: Self-pay | Admitting: Physician Assistant

## 2017-07-14 NOTE — Progress Notes (Deleted)
Complete Physical  Assessment and Plan:  Essential hypertension - continue medications, DASH diet, exercise and monitor at home. Call if greater than 130/80.  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 WILL NOT GET A1C, too early, will see Dr. Darnell Level in 2 months - Hemoglobin A1c  CKD (chronic kidney disease) stage 2, GFR 60-89 ml/min Increase fluids, avoid NSAIDS, monitor sugars, will monitor - BASIC METABOLIC PANEL WITH GFR  Obesity (BMI 33) Obesity with co morbidities- long discussion about weight loss, diet, and exercise - Testosterone   Hyperlipidemia -continue medications, check lipids, decrease fatty foods, increase activity.  - Lipid panel  Vitamin D deficiency - Vit D  25 hydroxy (rtn osteoporosis monitoring)  Prostate cancer screening - PSA   Gastroesophageal reflux disease with esophagitis Continue PPI/H2 blocker, diet discussed   Hypoventilation syndrome Sleep apnea- continue CPAP, weight loss advised.    Ventral hernia without obstruction or gangrene monitor  BPH (benign prostatic hypertrophy) cialis 20 mg prescribed, normal DRE, check urine   Erectile dysfunction associated with type 2 diabetes mellitus Cialis PRN   Ascending aorta aneurysm Control blood pressure, cholesterol, glucose, increase exercise.  Continue cardio follow up   Discussed med's effects and SE's. Screening labs and tests as requested with regular follow-up as recommended. Over 40 minutes of exam, counseling, chart review and critical decision making was performed  Future  Appointments  Date Time Provider [place]  07/15/2017  3:00 PM Liane Comber, NP [facility] None  09/28/2017  3:00 PM [name], MD [facility] None  07/19/2018  3:00 PM Liane Comber, NP [facility] None     HPI Patient presents for a complete physical. has Ventral hernia - periumbilical 4-6FK; Obesity (BMI 30.0-34.9); Hyperlipidemia; Hypertension; Vitamin D deficiency; GERD (gastroesophageal reflux disease); CKD stage 2 due to type 2 diabetes mellitus ([place]); Obesity hypoventilation syndrome (HCC); BPH (benign prostatic hyperplasia); Erectile dysfunction associated with type 2 diabetes mellitus ([place]); PVC's (premature ventricular contractions); Aneurysm, ascending aorta (HCC); and LADA (latent autoimmune diabetes in adults), managed as type 2 ([place]) on their problem list. Had normal stress test and echo with Dr. 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    BMI is There is no height or weight on file to calculate BMI., he {HAS HAS CLE:75170} been working on diet and exercise. Wt Readings from Last 3 Encounters:  05/26/17 264 lb (119.7 kg)  04/07/17 264 lb (119.7 kg)  01/26/17 265 lb 3.2 oz (120.3 kg)   His blood pressure {HAS HAS NOT:18834} been controlled at home, today their BP is   He {DOES_DOES YFV:49449} workout. He denies chest pain, shortness of breath, dizziness.   He {ACTION; IS/IS QPR:[id number]} on cholesterol medication and denies myalgias. His cholesterol {ACTION; IS/IS NOT:[id number]} at goal. The cholesterol last visit was:   Lab Results  Component Value Date   CHOL 144 04/07/2017   HDL 42 04/07/2017   LDLCALC 70 04/07/2017   TRIG 230 (H) 04/07/2017   CHOLHDL 3.4 04/07/2017   He {Has/has not:18111} been working on diet and exercise for T2 diabetes, he is on bASA, he is on ACE/ARB and denies {Symptoms; diabetes w/o none:19199}. Last A1C in the office was:  Lab Results  Component Value Date   HGBA1C 7.0 05/26/2017   Last GFR: Lab  Results  Component Value Date   GFRNONAA 72 04/07/2017    Patient is on Vitamin D supplement.   Lab Results  Component Value Date   VD25OH 36 06/02/2016     Last PSA was: Lab Results  Component Value Date   PSA 3.0 06/02/2016    Current Medications:  Current Outpatient Medications on File Prior to Visit  Medication Sig Dispense Refill  . aspirin 81 MG tablet Take 81 mg by mouth daily.    Marland Kitchen atorvastatin (LIPITOR) 40 MG tablet TAKE 1 TABLET DAILY FOR CHOLESTEROL 90 tablet 0  . BD INSULIN SYRINGE ULTRAFINE 31G X 5/16" 0.5 ML MISC USE TWICE A DAY AS DIRECTED 200 each 49  . bisoprolol-hydrochlorothiazide (ZIAC) 5-6.25 MG tablet TAKE 1 TABLET DAILY FOR BLOOD PRESSURE 90 tablet 1  . cholecalciferol (VITAMIN D) 1000 units tablet Take 1,000 Units by mouth daily.    Marland Kitchen glucose 4 GM chewable tablet Chew 1 tablet (4 g total) by mouth as needed for low blood sugar. 30 tablet 12  . insulin lispro (HUMALOG) 100 UNIT/ML injection INJECT 60 TO 80 UNITS UNDER THE SKIN PER DAY WITH MEALS DEPENDING ON MEALS 210 mL 4  . LANTUS 100 UNIT/ML injection INJECT 60 UNITS UNDER THE SKIN AT BEDTIME 60 mL 4  . losartan-hydrochlorothiazide (HYZAAR) 100-12.5 MG tablet TAKE 1 TABLET DAILY 90 tablet 1  . metFORMIN (GLUCOPHAGE-XR) 500 MG 24 hr tablet Take 2 tablets 2 x / day with food for Diabetes 360 tablet 3  . omeprazole (PRILOSEC) 20 MG capsule TAKE 1 CAPSULE DAILY FOR ACID REFLUX 90 capsule 0  . ONE TOUCH ULTRA TEST test strip USE 3 TO 4 TIMES A DAY FOR FLUCTUATING BLOOD SUGARS 400 each 3  . potassium chloride SA (K-DUR,KLOR-CON) 20 MEQ tablet TAKE 1 TABLET TWICE A DAY 180 tablet 1  . predniSONE (DELTASONE) 20 MG tablet 2 tablets daily for 3 days, 1 tablet daily for 4 days. 10 tablet 0  . tadalafil (CIALIS) 20 MG tablet Take 1 tablet (20 mg total) by mouth daily as needed for erectile dysfunction. 60 tablet 3  . TRULICITY 1.5 VQ/2.5ZD SOPN INJECT 1.5 MG WEEKLY 6 mL 0   No current facility-administered medications  on file prior to visit.    Allergies:  Allergies  Allergen Reactions  . Crestor [Rosuvastatin]    Health Maintenance:  Immunization History  Administered Date(s) Administered  . Influenza Inj Mdck Quad With Preservative 12/19/2016  . Influenza Split 11/28/2013, 12/06/2014  . Influenza-Unspecified 12/19/2011, 01/04/2013, 01/01/2016  . Pneumococcal Polysaccharide-23 03/03/2006  . Td 03/03/2008  . Zoster 02/07/2011   Tetanus: 2010 Pneumovax: 2008 Prevnar 13: N/A Flu vaccine: 2018 Zostavax: 2012  DEXA: N/A Colonoscopy: 12/2013 (Dr. Cristina Gong) Echo 2016 Stress test 2016 EGD: N/A  Eye Exam: Dr. Gershon Crane q yearly Dentist: Dr. Ronita Hipps, [place]  Patient Care Team: Unk Pinto, MD as PCP - General (Internal Medicine) Rutherford Guys, MD as Consulting Physician (Ophthalmology) Ronald Lobo, MD as Consulting Physician (Gastroenterology) Larey Dresser, MD as Consulting Physician (Cardiology)  Medical History:  has Ventral hernia - periumbilical 6-3OV; Obesity (BMI 30.0-34.9); Hyperlipidemia; Hypertension; Vitamin D deficiency; GERD (gastroesophageal reflux disease); CKD stage 2 due to type 2 diabetes mellitus ([place]); Obesity hypoventilation syndrome (HCC); BPH (benign prostatic hyperplasia); Erectile dysfunction associated with type 2 diabetes mellitus ([place]); PVC's (premature ventricular contractions); Aneurysm, ascending aorta (HCC); and LADA (latent autoimmune diabetes in adults), managed  as type 2 ([place]) on their problem list. Surgical History:  He  has a past surgical history that includes Anal fissure repair (2007) and Ventral hernia repair (04/13/12). Family History:  His family history includes Cancer in his father. Social History:   reports that he has never smoked. He has never used smokeless tobacco. He reports that he drinks alcohol. He reports that he does not use drugs. Review of Systems:  Review of Systems  Constitutional: Negative for malaise/fatigue and weight  loss.  HENT: Negative for hearing loss and tinnitus.   Eyes: Negative for blurred vision and double vision.  Respiratory: Negative for cough, sputum production, shortness of breath and wheezing.   Cardiovascular: Negative for chest pain, palpitations, orthopnea, claudication, leg swelling and PND.  Gastrointestinal: Negative for abdominal pain, blood in stool, constipation, diarrhea, heartburn, melena, nausea and vomiting.  Genitourinary: Negative.   Musculoskeletal: Negative for falls, joint pain and myalgias.  Skin: Negative for rash.  Neurological: Negative for dizziness, tingling, sensory change, weakness and headaches.  Endo/Heme/Allergies: Negative for polydipsia.  Psychiatric/Behavioral: Negative.  Negative for depression, memory loss, substance abuse and suicidal ideas. The patient is not nervous/anxious and does not have insomnia.   All other systems reviewed and are negative.   Physical Exam: Estimated body mass index is 33.9 kg/m as calculated from the following:   Height as of 05/26/17: 6\' 2"  (1.88 m).   Weight as of 05/26/17: 264 lb (119.7 kg). There were no vitals taken for this visit. General Appearance: Well nourished, in no apparent distress.  Eyes: PERRLA, EOMs, conjunctiva no swelling or erythema, normal fundi and vessels.  Sinuses: No Frontal/maxillary tenderness  ENT/Mouth: Ext aud canals clear, normal light reflex with TMs without erythema, bulging. Good dentition. No erythema, swelling, or exudate on post pharynx. Tonsils not swollen or erythematous. Hearing normal.  Neck: Supple, thyroid normal. No bruits  Respiratory: Respiratory effort normal, BS equal bilaterally without rales, rhonchi, wheezing or stridor.  Cardio: RRR without murmurs, rubs or gallops. Brisk peripheral pulses without edema.  Chest: symmetric, with normal excursions and percussion.  Abdomen: Soft, nontender, no guarding, rebound, hernias, masses, or organomegaly.  Lymphatics: Non tender without  lymphadenopathy.  Genitourinary:  Musculoskeletal: Full ROM all peripheral extremities,5/5 strength, and normal gait.  Skin: Warm, dry without rashes, lesions, ecchymosis. Neuro: Cranial nerves intact, reflexes equal bilaterally. Normal muscle tone, no cerebellar symptoms. Sensation intact.  Psych: Awake and oriented X 3, normal affect, Insight and Judgment appropriate.   EKG: WNL no changes.  Gorden Harms Jennessa Trigo 2:10 PM [facility]

## 2017-07-15 ENCOUNTER — Encounter: Payer: Self-pay | Admitting: Adult Health

## 2017-07-26 ENCOUNTER — Other Ambulatory Visit: Payer: Self-pay | Admitting: Adult Health

## 2017-08-03 NOTE — Progress Notes (Signed)
Complete Physical  Assessment and Plan:  Essential hypertension - continue medications, DASH diet, exercise and monitor at home. Call if greater than 130/80.  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 Followed by [name] - defer A1C to her visit  CKD (chronic kidney disease) stage 2, GFR 60-89 ml/min Increase fluids, avoid NSAIDS, monitor sugars, will monitor - BASIC METABOLIC PANEL WITH GFR  Obesity (BMI 33) Obesity with co morbidities- long discussion about weight loss, diet, and exercise   Hyperlipidemia -continue medications, check lipids, decrease fatty foods, increase activity.  - Lipid panel  Vitamin D deficiency - Vit D  25 hydroxy (rtn osteoporosis monitoring)  Prostate cancer screening - PSA   Gastroesophageal reflux disease with esophagitis Continue PPI/H2 blocker, diet discussed   Hypoventilation syndrome Sleep apnea- continue CPAP, weight loss advised.    Ventral hernia without obstruction or gangrene monitor  BPH (benign prostatic hypertrophy) Symptoms stable, check PSA, continue cialis   Erectile dysfunction associated with type 2 diabetes mellitus Cialis PRN   Ascending aorta aneurysm Control blood pressure, cholesterol, glucose, increase exercise.  Continue cardio follow up   Discussed med's effects and SE's. Screening labs and tests as requested with regular follow-up as recommended. Over 40 minutes of exam, counseling, chart review and critical decision making was performed  Future Appointments  Date Time Provider [place]   09/28/2017  3:00 PM [name], MD [facility] None  08/09/2018 10:00 AM Liane Comber, NP [facility] None     HPI Patient presents for a complete physical. has Ventral hernia - periumbilical 1-3YQ; Obesity (BMI 30.0-34.9); Hyperlipidemia; Hypertension; Vitamin D deficiency; GERD (gastroesophageal reflux disease); CKD stage 2 due to type 2 diabetes mellitus ([place]); Obesity hypoventilation syndrome (HCC); BPH (benign prostatic hyperplasia); Erectile dysfunction associated with type 2 diabetes mellitus ([place]); PVC's (premature ventricular contractions); Aneurysm, ascending aorta (HCC); and LADA (latent autoimmune diabetes in adults), managed as type 2 ([place]) on their problem list. Had normal stress test and echo with Dr. 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  He does endorse new afternoon fatigue for the past 6 months which he is concerned about.   BMI is Body mass index is 33.54 kg/m., he does somewhat watch his diet, light breakfast with oatmeal, English muffin, salad and chicken for lunch, etc; he admits to not exercising much.  Wt Readings from Last 3 Encounters:  08/04/17 264 lb 12.8 oz (120.1 kg)  05/26/17 264 lb (119.7 kg)  04/07/17 264 lb (119.7 kg)   His blood pressure has been controlled at home, today their BP is BP: 118/72 He does not workout. He denies chest pain, shortness of breath, dizziness.   He is on cholesterol medication (atorvastatin 40 mg daily) and denies myalgias. His cholesterol is at goal. The cholesterol last visit was:   Lab Results  Component Value Date   CHOL 144 04/07/2017   HDL 42 04/07/2017   LDLCALC 70 04/07/2017   TRIG 230 (H) 04/07/2017   CHOLHDL 3.4 04/07/2017   He has been working on diet for T2 diabetes, he is on bASA, he is on ACE/ARB and denies foot ulcerations, increased appetite, nausea, paresthesia of the feet, polydipsia,  polyuria, visual disturbances, vomiting and weight loss. He checks glucose 2-3 times  daily, fasting runs 80-120. He does endorse 2 episodes of symptomatic hypoglycemia (in the 50s); he reports weakness, disorientation with this, is able to recognize and correct. Last A1C in the office was:  Lab Results  Component Value Date   HGBA1C 7.0 05/26/2017   Last GFR: Lab Results  Component Value Date   GFRNONAA 72 04/07/2017    Patient is on Vitamin D supplement.   Lab Results  Component Value Date   VD25OH 36 06/02/2016     Last PSA was: Lab Results  Component Value Date   PSA 3.0 06/02/2016    Current Medications:  Current Outpatient Medications on File Prior to Visit  Medication Sig Dispense Refill  . aspirin 81 MG tablet Take 81 mg by mouth daily.    Marland Kitchen atorvastatin (LIPITOR) 40 MG tablet TAKE 1 TABLET DAILY FOR CHOLESTEROL 90 tablet 0  . BD INSULIN SYRINGE ULTRAFINE 31G X 5/16" 0.5 ML MISC USE TWICE A DAY AS DIRECTED 200 each 49  . bisoprolol-hydrochlorothiazide (ZIAC) 5-6.25 MG tablet TAKE 1 TABLET DAILY FOR BLOOD PRESSURE 90 tablet 1  . cholecalciferol (VITAMIN D) 1000 units tablet Take 1,000 Units by mouth daily.    Marland Kitchen glucose 4 GM chewable tablet Chew 1 tablet (4 g total) by mouth as needed for low blood sugar. 30 tablet 12  . insulin lispro (HUMALOG) 100 UNIT/ML injection INJECT 60 TO 80 UNITS UNDER THE SKIN PER DAY WITH MEALS DEPENDING ON MEALS 210 mL 4  . LANTUS 100 UNIT/ML injection INJECT 60 UNITS UNDER THE SKIN AT BEDTIME 60 mL 4  . losartan-hydrochlorothiazide (HYZAAR) 100-12.5 MG tablet TAKE 1 TABLET DAILY 90 tablet 1  . metFORMIN (GLUCOPHAGE-XR) 500 MG 24 hr tablet Take 2 tablets 2 x / day with food for Diabetes 360 tablet 3  . omeprazole (PRILOSEC) 20 MG capsule TAKE 1 CAPSULE DAILY FOR ACID REFLUX 90 capsule 0  . ONE TOUCH ULTRA TEST test strip USE 3 TO 4 TIMES A DAY FOR FLUCTUATING BLOOD SUGARS 400 each 3  . potassium chloride SA (K-DUR,KLOR-CON) 20 MEQ tablet TAKE 1 TABLET TWICE A DAY 180 tablet 1  . tadalafil (CIALIS) 20 MG tablet Take 1 tablet (20  mg total) by mouth daily as needed for erectile dysfunction. 60 tablet 3  . TRULICITY 1.5 ER/1.5QM SOPN INJECT 1.5 MG WEEKLY 6 mL 0   No current facility-administered medications on file prior to visit.    Allergies:  Allergies  Allergen Reactions  . Crestor [Rosuvastatin]    Health Maintenance:  Immunization History  Administered Date(s) Administered  . Influenza Inj Mdck Quad With Preservative 12/19/2016  . Influenza Split 11/28/2013, 12/06/2014  . Influenza-Unspecified 12/19/2011, 01/04/2013, 01/01/2016  . Pneumococcal Polysaccharide-23 03/03/2006  . Td 03/03/2008  . Zoster 02/07/2011   Tetanus: 2010 Pneumovax: 2008 Prevnar 13: N/A Flu vaccine: 2018 Zostavax: 2012  DEXA: N/A Colonoscopy: 12/2013 (Dr. Cristina Gong) Echo 2016 Stress test 2016 EGD: N/A  Eye Exam: Dr. Gershon Crane q yearly, last 05/2017 - report verified and abstracted Dentist: Dr. Tyron Russell, last visit 2019   Patient Care Team: Unk Pinto, MD as PCP - General (Internal Medicine) Rutherford Guys, MD as Consulting Physician (Ophthalmology) Ronald Lobo, MD as Consulting Physician (Gastroenterology) Larey Dresser, MD as Consulting Physician (Cardiology)  Medical History:  has Ventral hernia - periumbilical 0-8QP; Obesity (BMI 30.0-34.9); Hyperlipidemia; Hypertension; Vitamin D deficiency; GERD (gastroesophageal reflux disease); CKD stage 2 due to type 2 diabetes  mellitus ([place]); Obesity hypoventilation syndrome (HCC); BPH (benign prostatic hyperplasia); Erectile dysfunction associated with type 2 diabetes mellitus ([place]); PVC's (premature ventricular contractions); Aneurysm, ascending aorta (HCC); and LADA (latent autoimmune diabetes in adults), managed as type 2 ([place]) on their problem list. Surgical History:  He  has a past surgical history that includes Anal fissure repair (2007) and Ventral hernia repair (04/13/12). Family History:  His family history includes Cancer in his paternal grandfather;  Dementia in his paternal grandmother; Kidney cancer in his father; Stroke in his maternal grandmother. Social History:   reports that he has never smoked. He has never used smokeless tobacco. He reports that he drinks alcohol. He reports that he does not use drugs. Review of Systems:  Review of Systems  Constitutional: Positive for malaise/fatigue (Mid afternoon fatigue x 3-6 months). Negative for weight loss.  HENT: Negative for hearing loss and tinnitus.   Eyes: Negative for blurred vision and double vision.  Respiratory: Negative for cough, sputum production, shortness of breath and wheezing.   Cardiovascular: Negative for chest pain, palpitations, orthopnea, claudication, leg swelling and PND.  Gastrointestinal: Negative for abdominal pain, blood in stool, constipation, diarrhea, heartburn, melena, nausea and vomiting.  Genitourinary: Negative.   Musculoskeletal: Negative for falls, joint pain and myalgias.  Skin: Negative for rash.  Neurological: Negative for dizziness, tingling, sensory change, weakness and headaches.  Endo/Heme/Allergies: Negative for polydipsia.  Psychiatric/Behavioral: Negative.  Negative for depression, memory loss, substance abuse and suicidal ideas. The patient is not nervous/anxious and does not have insomnia.   All other systems reviewed and are negative.   Physical Exam: Estimated body mass index is 33.54 kg/m as calculated from the following:   Height as of this encounter: 6' 2.5" (1.892 m).   Weight as of this encounter: 264 lb 12.8 oz (120.1 kg). BP 118/72   Pulse 64   Temp 98.1 F (36.7 C)   Resp 16   Ht 6' 2.5" (1.892 m) Comment: w/ shoes  Wt 264 lb 12.8 oz (120.1 kg)   SpO2 95%   BMI 33.54 kg/m  General Appearance: Well nourished, in no apparent distress.  Eyes: PERRLA, EOMs, conjunctiva no swelling or erythema, normal fundi and vessels.  Sinuses: No Frontal/maxillary tenderness  ENT/Mouth: Ext aud canals clear, normal light reflex with TMs  without erythema, bulging. Good dentition. No erythema, swelling, or exudate on post pharynx. Tonsils not swollen or erythematous. Hearing normal.  Neck: Supple, thyroid normal. No bruits  Respiratory: Respiratory effort normal, BS equal bilaterally without rales, rhonchi, wheezing or stridor.  Cardio: RRR without murmurs, rubs or gallops. Brisk peripheral pulses without edema.  Chest: symmetric, with normal excursions and percussion.  Abdomen: Soft, nontender, no guarding, rebound, hernias, masses, or organomegaly.  Lymphatics: Non tender without lymphadenopathy.  Genitourinary:  Musculoskeletal: Full ROM all peripheral extremities,5/5 strength, and normal gait.  Skin: Warm, dry without rashes, lesions, ecchymosis. Neuro: Cranial nerves intact, reflexes equal bilaterally. Normal muscle tone, no cerebellar symptoms. Sensation intact.  Psych: Awake and oriented X 3, normal affect, Insight and Judgment appropriate.   EKG: WNL no changes.  Gorden Harms Josemiguel Gries 10:26 AM [facility]

## 2017-08-04 ENCOUNTER — Encounter: Payer: Self-pay | Admitting: Adult Health

## 2017-08-04 ENCOUNTER — Ambulatory Visit: Payer: 59 | Admitting: Adult Health

## 2017-08-04 VITALS — BP 118/72 | HR 64 | Temp 98.1°F | Resp 16 | Ht 74.5 in | Wt 264.8 lb

## 2017-08-04 DIAGNOSIS — E1169 Type 2 diabetes mellitus with other specified complication: Secondary | ICD-10-CM

## 2017-08-04 DIAGNOSIS — K2100 Gastro-esophageal reflux disease with esophagitis, without bleeding: Secondary | ICD-10-CM

## 2017-08-04 DIAGNOSIS — I712 Thoracic aortic aneurysm, without rupture: Secondary | ICD-10-CM

## 2017-08-04 DIAGNOSIS — Z125 Encounter for screening for malignant neoplasm of prostate: Secondary | ICD-10-CM | POA: Diagnosis not present

## 2017-08-04 DIAGNOSIS — Z136 Encounter for screening for cardiovascular disorders: Secondary | ICD-10-CM | POA: Diagnosis not present

## 2017-08-04 DIAGNOSIS — Z0000 Encounter for general adult medical examination without abnormal findings: Secondary | ICD-10-CM

## 2017-08-04 DIAGNOSIS — R5383 Other fatigue: Secondary | ICD-10-CM

## 2017-08-04 DIAGNOSIS — I10 Essential (primary) hypertension: Secondary | ICD-10-CM

## 2017-08-04 DIAGNOSIS — Z13220 Encounter for screening for lipoid disorders: Secondary | ICD-10-CM | POA: Diagnosis not present

## 2017-08-04 DIAGNOSIS — N401 Enlarged prostate with lower urinary tract symptoms: Secondary | ICD-10-CM | POA: Diagnosis not present

## 2017-08-04 DIAGNOSIS — R351 Nocturia: Secondary | ICD-10-CM

## 2017-08-04 DIAGNOSIS — Z1389 Encounter for screening for other disorder: Secondary | ICD-10-CM | POA: Diagnosis not present

## 2017-08-04 DIAGNOSIS — R350 Frequency of micturition: Secondary | ICD-10-CM

## 2017-08-04 DIAGNOSIS — Z1329 Encounter for screening for other suspected endocrine disorder: Secondary | ICD-10-CM | POA: Diagnosis not present

## 2017-08-04 DIAGNOSIS — E662 Morbid (severe) obesity with alveolar hypoventilation: Secondary | ICD-10-CM

## 2017-08-04 DIAGNOSIS — E139 Other specified diabetes mellitus without complications: Secondary | ICD-10-CM

## 2017-08-04 DIAGNOSIS — I7121 Aneurysm of the ascending aorta, without rupture: Secondary | ICD-10-CM

## 2017-08-04 DIAGNOSIS — K439 Ventral hernia without obstruction or gangrene: Secondary | ICD-10-CM

## 2017-08-04 DIAGNOSIS — E559 Vitamin D deficiency, unspecified: Secondary | ICD-10-CM | POA: Diagnosis not present

## 2017-08-04 DIAGNOSIS — E1122 Type 2 diabetes mellitus with diabetic chronic kidney disease: Secondary | ICD-10-CM

## 2017-08-04 DIAGNOSIS — N182 Chronic kidney disease, stage 2 (mild): Secondary | ICD-10-CM

## 2017-08-04 DIAGNOSIS — E785 Hyperlipidemia, unspecified: Secondary | ICD-10-CM

## 2017-08-04 DIAGNOSIS — N521 Erectile dysfunction due to diseases classified elsewhere: Secondary | ICD-10-CM

## 2017-08-04 DIAGNOSIS — I493 Ventricular premature depolarization: Secondary | ICD-10-CM

## 2017-08-04 DIAGNOSIS — E669 Obesity, unspecified: Secondary | ICD-10-CM

## 2017-08-04 DIAGNOSIS — Z79899 Other long term (current) drug therapy: Secondary | ICD-10-CM

## 2017-08-04 NOTE — Patient Instructions (Signed)
I'd like for you to think about increasing daily activity - this can help with insulin sensitivity and improve overall metabolism, and is very important for long term mobility and overall health. Aim for 30 min daily.      Aim for 7+ servings of fruits and vegetables daily  80+ fluid ounces of water or unsweet tea for healthy kidneys  Limit alcohol intake; max 1-2 drinks per sitting  Limit animal fats in diet for cholesterol and heart health - choose grass fed whenever available  Aim for low stress - take time to unwind and care for your mental health  Aim for 150 min of moderate intensity exercise weekly for heart health, and weights twice weekly for bone health  Aim for 7-9 hours of sleep daily      When it comes to diets, agreement about the perfect plan isn't easy to find, even among the experts. Experts at the [employer] developed an idea known as the Healthy Eating Plate. Just imagine a plate divided into logical, healthy portions.  The emphasis is on diet quality:  Load up on vegetables and fruits - one-half of your plate: Aim for color and variety, and remember that potatoes don't count.  Go for whole grains - one-quarter of your plate: Whole wheat, barley, wheat berries, quinoa, oats, brown rice, and foods made with them. If you want pasta, go with whole wheat pasta.  Protein power - one-quarter of your plate: Fish, chicken, beans, and nuts are all healthy, versatile protein sources. Limit red meat.  The diet, however, does go beyond the plate, offering a few other suggestions.  Use healthy plant oils, such as olive, canola, soy, corn, sunflower and peanut. Check the labels, and avoid partially hydrogenated oil, which have unhealthy trans fats.  If you're thirsty, drink water. Coffee and tea are good in moderation, but skip sugary drinks and limit milk and dairy products to one or two daily servings.  The type of carbohydrate in the diet is more  important than the amount. Some sources of carbohydrates, such as vegetables, fruits, whole grains, and beans-are healthier than others.  Finally, stay active.

## 2017-08-05 LAB — CBC WITH DIFFERENTIAL/PLATELET
BASOS ABS: 67 {cells}/uL (ref 0–200)
Basophils Relative: 1.1 %
EOS PCT: 2 %
Eosinophils Absolute: 122 cells/uL (ref 15–500)
HEMATOCRIT: 43.3 % (ref 38.5–50.0)
Hemoglobin: 14.8 g/dL (ref 13.2–17.1)
LYMPHS ABS: 1190 {cells}/uL (ref 850–3900)
MCH: 28.1 pg (ref 27.0–33.0)
MCHC: 34.2 g/dL (ref 32.0–36.0)
MCV: 82.3 fL (ref 80.0–100.0)
MONOS PCT: 9.3 %
MPV: 11 fL (ref 7.5–12.5)
Neutro Abs: 4154 cells/uL (ref 1500–7800)
Neutrophils Relative %: 68.1 %
Platelets: 208 10*3/uL (ref 140–400)
RBC: 5.26 10*6/uL (ref 4.20–5.80)
RDW: 13 % (ref 11.0–15.0)
Total Lymphocyte: 19.5 %
WBC mixed population: 567 cells/uL (ref 200–950)
WBC: 6.1 10*3/uL (ref 3.8–10.8)

## 2017-08-05 LAB — COMPLETE METABOLIC PANEL WITH GFR
AG Ratio: 1.7 (calc) (ref 1.0–2.5)
ALKALINE PHOSPHATASE (APISO): 84 U/L (ref 40–115)
ALT: 20 U/L (ref 9–46)
AST: 20 U/L (ref 10–35)
Albumin: 4.1 g/dL (ref 3.6–5.1)
BUN: 18 mg/dL (ref 7–25)
CALCIUM: 9.2 mg/dL (ref 8.6–10.3)
CO2: 30 mmol/L (ref 20–32)
CREATININE: 1.09 mg/dL (ref 0.70–1.25)
Chloride: 102 mmol/L (ref 98–110)
GFR, EST NON AFRICAN AMERICAN: 72 mL/min/{1.73_m2} (ref >=60)
GFR, Est African American: 83 mL/min/{1.73_m2} (ref >=60)
GLUCOSE: 89 mg/dL (ref 65–99)
Globulin: 2.4 g/dL (calc) (ref 1.9–3.7)
Potassium: 3.6 mmol/L (ref 3.5–5.3)
Sodium: 141 mmol/L (ref 135–146)
Total Bilirubin: 0.8 mg/dL (ref 0.2–1.2)
Total Protein: 6.5 g/dL (ref 6.1–8.1)

## 2017-08-05 LAB — URINALYSIS W MICROSCOPIC + REFLEX CULTURE
BACTERIA UA: NONE SEEN /HPF
Bilirubin Urine: NEGATIVE
Glucose, UA: NEGATIVE
HGB URINE DIPSTICK: NEGATIVE
Hyaline Cast: NONE SEEN /LPF
KETONES UR: NEGATIVE
LEUKOCYTE ESTERASE: NEGATIVE
Nitrites, Initial: NEGATIVE
Protein, ur: NEGATIVE
RBC / HPF: NONE SEEN /HPF (ref 0–2)
SQUAMOUS EPITHELIAL / LPF: NONE SEEN /HPF (ref <=5)
Specific Gravity, Urine: 1.021 (ref 1.001–1.03)
WBC UA: NONE SEEN /HPF (ref 0–5)
pH: <=5 (ref 5.0–8.0)

## 2017-08-05 LAB — NO CULTURE INDICATED

## 2017-08-05 LAB — PSA: PSA: 3.2 ng/mL (ref <=4.0)

## 2017-08-05 LAB — MICROALBUMIN / CREATININE URINE RATIO
CREATININE, URINE: 114 mg/dL (ref 20–320)
MICROALB UR: 0.8 mg/dL
Microalb Creat Ratio: 7 mcg/mg creat (ref <30)

## 2017-08-05 LAB — VITAMIN D 25 HYDROXY (VIT D DEFICIENCY, FRACTURES): Vit D, 25-Hydroxy: 34 ng/mL (ref 30–100)

## 2017-08-05 LAB — LIPID PANEL
CHOL/HDL RATIO: 3 (calc) (ref <5.0)
CHOLESTEROL: 130 mg/dL (ref <200)
HDL: 43 mg/dL (ref >40)
LDL CHOLESTEROL (CALC): 65 mg/dL
Non-HDL Cholesterol (Calc): 87 mg/dL (calc) (ref <130)
TRIGLYCERIDES: 140 mg/dL (ref <150)

## 2017-08-05 LAB — VITAMIN B12: Vitamin B-12: 372 pg/mL (ref 200–1100)

## 2017-08-05 LAB — TSH: TSH: 3.45 mIU/L (ref 0.40–4.50)

## 2017-08-05 LAB — TESTOSTERONE: TESTOSTERONE: 425 ng/dL (ref 250–827)

## 2017-08-16 ENCOUNTER — Other Ambulatory Visit: Payer: Self-pay | Admitting: Adult Health

## 2017-09-21 ENCOUNTER — Other Ambulatory Visit: Payer: Self-pay | Admitting: Internal Medicine

## 2017-09-21 MED ORDER — "INSULIN SYRINGE-NEEDLE U-100 31G X 5/16"" 0.5 ML MISC": 99 refills | Status: DC

## 2017-09-28 ENCOUNTER — Telehealth (HOSPITAL_COMMUNITY): Payer: Self-pay | Admitting: Vascular Surgery

## 2017-09-28 ENCOUNTER — Encounter: Payer: Self-pay | Admitting: Internal Medicine

## 2017-09-28 ENCOUNTER — Ambulatory Visit (INDEPENDENT_AMBULATORY_CARE_PROVIDER_SITE_OTHER): Payer: 59 | Admitting: Internal Medicine

## 2017-09-28 VITALS — BP 112/60 | HR 80 | Ht 74.0 in | Wt 261.0 lb

## 2017-09-28 DIAGNOSIS — E139 Other specified diabetes mellitus without complications: Secondary | ICD-10-CM

## 2017-09-28 DIAGNOSIS — E785 Hyperlipidemia, unspecified: Secondary | ICD-10-CM

## 2017-09-28 DIAGNOSIS — E669 Obesity, unspecified: Secondary | ICD-10-CM | POA: Diagnosis not present

## 2017-09-28 NOTE — Telephone Encounter (Signed)
Returned pt call, about making appt w/ [facility], pt does not have hf he has never been seen in HF clinic , pt needs to call [facility] to get established with new cardiology

## 2017-09-28 NOTE — Patient Instructions (Addendum)
Please continue:  Metformin ER 1000 mg 2x a day with lunch and dinner.  Trulicity 1.5 mg weekly  Humalog: - 10 units before a smaller meal - 20 units before a regular meal - 30 units before a larger meal or if you have dessert.  Please decrease: - Lantus to 30 units 2x a day:  Please return in 3-4 months with your sugar log.

## 2017-09-28 NOTE — Progress Notes (Signed)
Patient ID: Harry [name], male   DOB: 1955/03/12, 62 y.o.   MRN: [id number]   HPI: Harry [name] is a 62 y.o.-year-old male, returning for f/u for DM2, dx in early 1990s, and as LADA 07/2016, insulin-dependent since 1 year after dx, uncontrolled, with complications (CKD stage 2, ED). Last visit 4 months ago.  He had company in last week >> sugars higher, but he feels that they have been better since last visit.  He is more fatigued lately (last 3-4 mo). He does has OSA - wears a CPAP.   Last hemoglobin A1c was: Lab Results  Component Value Date   HGBA1C 7.0 05/26/2017   HGBA1C 6.7 01/26/2017   HGBA1C 6.8 10/22/2016   He is on:  Metformin ER 1000 mg 2x a day with lunch and dinner.  Trulicity 1.5 mg weekly  Lantus 35 units 2x a day.  Humalog: - 10 units before a smaller meal - 20 units before a regular meal - 30 units before a larger meal or if you have dessert.   Do not take any Humalog at bedtime unless sugars > 300 (+ 5 units), or > 400 (+ 10 units).  Pt checks his sugars 2X a day: - am: 100-234 >> 125-150 >> 85-140 >> 78-150 - 2h after b'fast: 180-313 >> n/c >> 165 >> n/c - before lunch: (L: 25 units) >> 164-325 >> n/c - 2h after lunch: n/c >> 354, 362 >> 72 >> n/c - before dinner: 268 >> n/c (D: 25 units) >> n/c - 2h after dinner: 117, 169-440 >>> n/c  - bedtime: 180-285 >> 110-200>> 185-220 >> 85-130, 250-280 (missed lunch insulin) - nighttime: n/c >>> 66 >> n/c Lowest sugar was 30s  ...>>... >> 60 >> 51 x1; he has hypoglycemia awareness  in the 60s. Highest sugar was in the 320 >> 280 >> 280.  Am: 10-15 units Lunch: 20-25 units Dinner: 20 units +/-  5 units at bedtime  Glucometer: one touch ultra 2  Pt's meals are: - Breakfast: Glucerna, banana - Lunch: eats out - salad with chicken; chicken burrito; sandwich; [employer] - Dinner: meat + veggie + starch - Snacks: potato chips, fruit, icecream No sodas.  - + Mild CKD, last BUN/creatinine:  Lab Results   Component Value Date   BUN 18 08/04/2017   BUN 17 04/07/2017   CREATININE 1.09 08/04/2017   CREATININE 1.09 04/07/2017  On losartan. -+ HL; last set of lipids: Lab Results  Component Value Date   CHOL 130 08/04/2017   HDL 43 08/04/2017   LDLCALC 65 08/04/2017   TRIG 140 08/04/2017   CHOLHDL 3.0 08/04/2017  On Lipitor. - last eye exam was in  05/2017: No DR- Dr. Gershon Crane - no numbness and tingling in his feet.  He also has a history of HTN, HL, GERD.  ROS: Constitutional: no weight gain/no weight loss, + fatigue, no subjective hyperthermia, no subjective hypothermia Eyes: no blurry vision, no xerophthalmia ENT: no sore throat, no nodules palpated in throat, no dysphagia, no odynophagia, no hoarseness Cardiovascular: no CP/no SOB/no palpitations/no leg swelling Respiratory: no cough/no SOB/no wheezing Gastrointestinal: no N/no V/no D/no C/no acid reflux Musculoskeletal: no muscle aches/no joint aches Skin: no rashes, no hair loss Neurological: no tremors/no numbness/no tingling/no dizziness  I reviewed pt's medications, allergies, PMH, social hx, family hx, and changes were documented in the history of present illness. Otherwise, unchanged from my initial visit note.  Past Medical History:  Diagnosis Date  . CKD (chronic kidney  disease) stage 2, GFR 60-89 ml/min    secondary to DM  . Diabetes mellitus without complication ([place]) 8527   insulin requiring  . GERD (gastroesophageal reflux disease)   . Hyperlipidemia   . Hypertension   . Hypokalemia   . Hypomagnesemia   . OSA on CPAP    10-12 years ago  . Vitamin D deficiency    Past Surgical History:  Procedure Laterality Date  . ANAL FISSURE REPAIR  2007  . VENTRAL HERNIA REPAIR  04/13/12   Dr. Johney Maine, ventral hernia repair w/ mesh   Social History   Social History  . Marital status: Married    Spouse name: N/A  . Number of children: 2   Occupational History  . estimator   Social History Main Topics  .  Smoking status: Never Smoker  . Smokeless tobacco: Not on file  . Alcohol use 0.0 oz/week    2 - 5 Cans of beer per week   Current Outpatient Medications on File Prior to Visit  Medication Sig Dispense Refill  . aspirin 81 MG tablet Take 81 mg by mouth daily.    Marland Kitchen atorvastatin (LIPITOR) 40 MG tablet TAKE 1 TABLET DAILY FOR CHOLESTEROL 90 tablet 0  . bisoprolol-hydrochlorothiazide (ZIAC) 5-6.25 MG tablet TAKE 1 TABLET DAILY FOR BLOOD PRESSURE 90 tablet 1  . cholecalciferol (VITAMIN D) 1000 units tablet Take 1,000 Units by mouth daily.    Marland Kitchen glucose 4 GM chewable tablet Chew 1 tablet (4 g total) by mouth as needed for low blood sugar. 30 tablet 12  . insulin lispro (HUMALOG) 100 UNIT/ML injection INJECT 60 TO 80 UNITS UNDER THE SKIN PER DAY WITH MEALS DEPENDING ON MEALS 210 mL 4  . Insulin Syringe-Needle U-100 (BD INSULIN SYRINGE U/F) 31G X 5/16" 0.5 ML MISC Inject Insulin 2 x /day as directed 200 each 99  . LANTUS 100 UNIT/ML injection INJECT 60 UNITS UNDER THE SKIN AT BEDTIME 60 mL 4  . losartan-hydrochlorothiazide (HYZAAR) 100-12.5 MG tablet TAKE 1 TABLET DAILY 90 tablet 1  . metFORMIN (GLUCOPHAGE-XR) 500 MG 24 hr tablet Take 2 tablets 2 x / day with food for Diabetes 360 tablet 3  . omeprazole (PRILOSEC) 20 MG capsule TAKE 1 CAPSULE DAILY FOR ACID REFLUX 90 capsule 0  . ONE TOUCH ULTRA TEST test strip USE 3 TO 4 TIMES A DAY FOR FLUCTUATING BLOOD SUGARS 400 each 3  . potassium chloride SA (K-DUR,KLOR-CON) 20 MEQ tablet TAKE 1 TABLET TWICE A DAY 180 tablet 1  . tadalafil (CIALIS) 20 MG tablet Take 1 tablet (20 mg total) by mouth daily as needed for erectile dysfunction. 60 tablet 3  . TRULICITY 1.5 PO/2.4MP SOPN INJECT 1.5 MG WEEKLY 6 mL 0   No current facility-administered medications on file prior to visit.    Allergies  Allergen Reactions  . Crestor [Rosuvastatin]    Family History  Problem Relation Age of Onset  . Kidney cancer Father        kidney  . Stroke Maternal Grandmother    . Dementia Paternal Grandmother        Early 38s  . Cancer Paternal Wynell Balloon of age or type    PE: BP 112/60   Pulse 80   Ht 6\' 2"  (1.88 m)   Wt 261 lb (118.4 kg)   SpO2 93%   BMI 33.51 kg/m  Wt Readings from Last 3 Encounters:  09/28/17 261 lb (118.4 kg)  08/04/17 264 lb  12.8 oz (120.1 kg)  05/26/17 264 lb (119.7 kg)   Constitutional: overweight, in NAD Eyes: PERRLA, EOMI, no exophthalmos ENT: moist mucous membranes, no thyromegaly, no cervical lymphadenopathy Cardiovascular: RRR, No MRG Respiratory: CTA B Gastrointestinal: abdomen soft, NT, ND, BS+ Musculoskeletal: no deformities, strength intact in all 4 Skin: moist, warm, no rashes Neurological: no tremor with outstretched hands, DTR normal in all 4  ASSESSMENT: 1. LADA, insulin-dependent, uncontrolled, with complications - CKD stage 2 - ED  - he is interested in an insulin pump  - he has an elliptical machine at home  Component     Latest Ref Rng & Units 07/11/2016  Hemoglobin A1C      6.5  Glutamic Acid Decarb Ab     <5 IU/mL 47 (H)  Pancreatic Islet Cell Antibody     <5 JDF Units <5  C-Peptide     0.80 - 3.85 ng/mL 0.37 (L)  Glucose, Fasting     65 - 99 mg/dL 104 (H)   Elevated GAD antibodies and decreased insulin production. 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 HL  3.  Obesity class I BMI Classification:  < 18.5 underweight   18.5-24.9 normal weight   25.0-29.9 overweight   30.0-34.9 class I obesity   35.0-39.9 class II obesity   ? 40.0 class III obesity   PLAN:  1. Patient with long-standing, uncontrolled, insulin-dependent diabetes (LADA), with slightly higher sugars at last visit, after he reintroduced snacks in his diet.  He was only occasionally using a low dose of Humalog with snacks.  He was also bolusing Humalog at bedtime if the sugars are higher than 200s, but I did  advise him to only bolus if the sugars are higher than 300s (only use 5 to 7 units) at last visit, he was missing metformin doses to avoid low blood sugars overnight and in the morning.  And I advised him to take it at the same time with Lantus. - Since last visit, sugars have improved, especially at bedtime, however, his sugars are still quite high if he forgets insulin with a meal - he continues to work on getting all the doses in - As he had some mild lows, in the 50s and 60s, will decrease his Lantus - he is interested in a pump >> he did not yet call his insurance to see if this is covered - I advised him to  to:  Patient Instructions  Please continue:  Metformin ER 1000 mg 2x a day with lunch and dinner.  Trulicity 1.5 mg weekly  Humalog: - 10 units before a smaller meal - 20 units before a regular meal - 30 units before a larger meal or if you have dessert.  Please decrease: - Lantus to 30 units 2x a day:  Please return in 3-4 months with your sugar log.   - today, HbA1c is 6.2%  (improved)  - continue checking sugars at different times of the day - check 3-4x a day, rotating checks - advised for yearly eye exams >> he is UTD - Return to clinic in 3-62mo with sugar log    2. HL - Reviewed latest lipid panel from 08/2017: All fractions are at goal.  Triglycerides improved. Lab Results  Component Value Date   CHOL 130 08/04/2017   HDL 43 08/04/2017   LDLCALC 65 08/04/2017   TRIG 140 08/04/2017   CHOLHDL  3.0 08/04/2017  - Continues Lipitor without side effects.  3.  Obesity class I -At last visit s he gained some of the lost weight back after he restarted snacking -He is down 3 pounds at this visit   [name], MD PhD [facility] Endocrinology

## 2017-09-29 LAB — POCT GLYCOSYLATED HEMOGLOBIN (HGB A1C): HEMOGLOBIN A1C: 6.2 % — AB (ref 4.0–5.6)

## 2017-09-29 NOTE — Addendum Note (Signed)
Addended by: Drucilla Schmidt on: 09/29/2017 08:05 AM   Modules accepted: Orders

## 2017-10-07 ENCOUNTER — Other Ambulatory Visit: Payer: Self-pay | Admitting: Internal Medicine

## 2017-10-08 MED ORDER — DULAGLUTIDE 1.5 MG/0.5ML ~~LOC~~ SOAJ: 1.5000 mg | SUBCUTANEOUS | 0 refills | Status: DC

## 2017-11-03 ENCOUNTER — Other Ambulatory Visit: Payer: Self-pay | Admitting: Internal Medicine

## 2017-11-05 ENCOUNTER — Ambulatory Visit: Payer: Self-pay | Admitting: Internal Medicine

## 2017-11-06 ENCOUNTER — Encounter: Payer: Self-pay | Admitting: Cardiovascular Disease

## 2017-11-06 ENCOUNTER — Ambulatory Visit (INDEPENDENT_AMBULATORY_CARE_PROVIDER_SITE_OTHER): Payer: 59 | Admitting: Cardiovascular Disease

## 2017-11-06 VITALS — BP 122/74 | HR 73 | Ht 73.5 in | Wt 262.4 lb

## 2017-11-06 DIAGNOSIS — R0609 Other forms of dyspnea: Secondary | ICD-10-CM | POA: Diagnosis not present

## 2017-11-06 DIAGNOSIS — I7121 Aneurysm of the ascending aorta, without rupture: Secondary | ICD-10-CM

## 2017-11-06 DIAGNOSIS — I493 Ventricular premature depolarization: Secondary | ICD-10-CM | POA: Diagnosis not present

## 2017-11-06 DIAGNOSIS — I712 Thoracic aortic aneurysm, without rupture: Secondary | ICD-10-CM

## 2017-11-06 DIAGNOSIS — R5383 Other fatigue: Secondary | ICD-10-CM

## 2017-11-06 DIAGNOSIS — E7800 Pure hypercholesterolemia, unspecified: Secondary | ICD-10-CM

## 2017-11-06 DIAGNOSIS — I10 Essential (primary) hypertension: Secondary | ICD-10-CM

## 2017-11-06 NOTE — Progress Notes (Signed)
Cardiology Office Note   Date:  11/06/2017   ID:  CHRYSTIAN CUPPLES, DOB 1955/09/11, MRN [id number]  PCP:  Unk Pinto, MD  Cardiologist:   Skeet Latch, MD   No chief complaint on file.     History of Present Illness: Harry [name] is a 62 y.o. male with mild ascending aorta aneurysm, hypertension, hyperlipidemia, OSA and diabetes who is here to re-establish care.  Harry [name] was previously a patient of [name].  He was last seen 05/22/2014.  He was initially referred due to an abnormal EKG and exertional dyspnea.  Exercise nuclear stress test 05/2014 was negative for ischemia but did show multiple runs of PVCs and short runs of NSVT.  He was subsequently referred for an echocardiogram 05/2014 that revealed LVEF 60 to 65% with grade 1 diastolic dysfunction.  He was noted to have a mild ascending aortic aneurysm measuring 4.2 cm.   For the last 6 or 8 months Harry [name] has been feeling very fatigued.  He notices it most in the afternoons.  He sometimes almost falls asleep when driving.  He has a known diagnosis of sleep apnea and uses his machine faithfully.  He feels rested in the morning.  He notes that he has been using it for the last 15 or 20 years and recently received a warning that is time for a new machine.  He thinks it is an auto titration machine.  He does not get much formal exercise and mostly sits at a desk at work.  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  He does not have any chest pain with exertion but does feel short of breath.  He attributes this to being overweight and not exercising.  He has not experienced any lower extremity edema, orthopnea, or PND.  He has been diagnosed with diabetes since his 16s.   Past Medical History:  Diagnosis Date  . CKD (chronic kidney disease) stage 2, GFR 60-89 ml/min    secondary to DM  . Diabetes mellitus without complication ([place]) 2423   insulin requiring  . GERD (gastroesophageal reflux disease)   .  Hyperlipidemia   . Hypertension   . Hypokalemia   . Hypomagnesemia   . OSA on CPAP    10-12 years ago  . Vitamin D deficiency     Past Surgical History:  Procedure Laterality Date  . ANAL FISSURE REPAIR  2007  . VENTRAL HERNIA REPAIR  04/13/12   Dr. Johney Maine, ventral hernia repair w/ mesh     Current Outpatient Medications  Medication Sig Dispense Refill  . aspirin 81 MG tablet Take 81 mg by mouth daily.    Marland Kitchen atorvastatin (LIPITOR) 40 MG tablet TAKE 1 TABLET DAILY FOR CHOLESTEROL 90 tablet 0  . bisoprolol-hydrochlorothiazide (ZIAC) 5-6.25 MG tablet TAKE 1 TABLET DAILY FOR BLOOD PRESSURE 90 tablet 1  . cholecalciferol (VITAMIN D) 1000 units tablet Take 1,000 Units by mouth daily.    . Dulaglutide (TRULICITY) 1.5 NT/6.1WE SOPN Inject 1.5 mg into the skin once a week. 6 mL 0  . glucose 4 GM chewable tablet Chew 1 tablet (4 g total) by mouth as needed for low blood sugar. 30 tablet 12  . insulin lispro (HUMALOG) 100 UNIT/ML injection INJECT 60 TO 80 UNITS UNDER THE SKIN PER DAY WITH MEALS DEPENDING ON MEALS 210 mL 4  . Insulin Syringe-Needle U-100 (BD INSULIN SYRINGE U/F) 31G X 5/16" 0.5 ML MISC Inject Insulin 2 x /  day as directed 200 each 99  . LANTUS 100 UNIT/ML injection INJECT 60 UNITS UNDER THE SKIN AT BEDTIME (Patient taking differently: Inject into the skin 2 (two) times daily. ) 60 mL 4  . losartan-hydrochlorothiazide (HYZAAR) 100-12.5 MG tablet TAKE 1 TABLET DAILY 90 tablet 1  . omeprazole (PRILOSEC) 20 MG capsule TAKE 1 CAPSULE DAILY FOR ACID REFLUX 90 capsule 3  . ONE TOUCH ULTRA TEST test strip USE 3 TO 4 TIMES A DAY FOR FLUCTUATING BLOOD SUGARS 400 each 3  . potassium chloride SA (K-DUR,KLOR-CON) 20 MEQ tablet TAKE 1 TABLET TWICE A DAY 180 tablet 1  . tadalafil (CIALIS) 20 MG tablet Take 1 tablet (20 mg total) by mouth daily as needed for erectile dysfunction. 60 tablet 3  . metFORMIN (GLUCOPHAGE-XR) 500 MG 24 hr tablet Take 2 tablets 2 x / day with food for Diabetes 360 tablet  3   No current facility-administered medications for this visit.     Allergies:   Crestor [rosuvastatin]    Social History:  The patient  reports that he has never smoked. He has never used smokeless tobacco. He reports that he drinks about 2.0 - 5.0 standard drinks of alcohol per week. He reports that he does not use drugs.   Family History:  The patient's family history includes Cancer in his paternal grandfather and paternal uncle; Dementia in his paternal grandmother; Kidney cancer in his father; Stroke in his maternal grandmother.    ROS:  Please see the history of present illness.   Otherwise, review of systems are positive for none.   All other systems are reviewed and negative.    PHYSICAL EXAM: VS:  BP 122/74   Pulse 73   Ht 6' 1.5" (1.867 m)   Wt 262 lb 6.4 oz (119 kg)   BMI 34.15 kg/m  , BMI Body mass index is 34.15 kg/m. GENERAL:  Well appearing HEENT:  Pupils equal round and reactive, fundi not visualized, oral mucosa unremarkable NECK:  No jugular venous distention, waveform within normal limits, carotid upstroke brisk and symmetric, no bruits LUNGS:  Clear to auscultation bilaterally HEART:  RRR.  PMI not displaced or sustained,S1 and S2 within normal limits, no S3, no S4, no clicks, no rubs, no murmurs ABD:  Flat, positive bowel sounds normal in frequency in pitch, no bruits, no rebound, no guarding, no midline pulsatile mass, no hepatomegaly, no splenomegaly EXT:  2 plus pulses throughout, no edema, no cyanosis no clubbing SKIN:  No rashes no nodules NEURO:  Cranial nerves II through XII grossly intact, motor grossly intact throughout PSYCH:  Cognitively intact, oriented to person place and time   EKG:  EKG is not ordered today.   Recent Labs: 04/07/2017: Magnesium 1.9 08/04/2017: ALT 20; BUN 18; Creat 1.09; Hemoglobin 14.8; Platelets 208; Potassium 3.6; Sodium 141; TSH 3.45    Lipid Panel    Component Value Date/Time   CHOL 130 08/04/2017 1049   TRIG 140  08/04/2017 1049   HDL 43 08/04/2017 1049   CHOLHDL 3.0 08/04/2017 1049   VLDL 40 (H) 06/02/2016 1541   LDLCALC 65 08/04/2017 1049      Wt Readings from Last 3 Encounters:  11/06/17 262 lb 6.4 oz (119 kg)  09/28/17 261 lb (118.4 kg)  08/04/17 264 lb 12.8 oz (120.1 kg)      ASSESSMENT AND PLAN:  # Fatigue: This likely isn't associated with a cardiac issue.  It isn't exertional and only happens at the end of the day.    #  Exertional dyspnea:  Likely 2/2 obesity and deconditioning.  He has no chest pain.  However given his risk factors we will get a Lexiscan Myoview.  He can't walk on a treadmill.    # Ascending aorta aneurysm: Repeat echo.  It was 4.2 cm in 2016.   # Essential hypertension: BP controlled.  Bisoprolol, HCTZ, losartan.   # Hyperlipidemia:  Continue atorvastatin.  LDL 65 on 08/2017.   Current medicines are reviewed at length with the patient today.  The patient does not have concerns regarding medicines.  The following changes have been made:  no change  Labs/ tests ordered today include:   Orders Placed This Encounter  Procedures  . MYOCARDIAL PERFUSION IMAGING  . ECHOCARDIOGRAM COMPLETE     Disposition:   FU with Kassia Demarinis C. Oval Linsey, MD, [facility] in 1 year.    Signed, Kutler Vanvranken C. Oval Linsey, MD, [facility]  11/06/2017 11:59 AM    [place]

## 2017-11-06 NOTE — Patient Instructions (Signed)
Medication Instructions:  Your physician recommends that you continue on your current medications as directed. Please refer to the Current Medication list given to you today.  Labwork: NONE  Testing/Procedures: Your physician has requested that you have an echocardiogram. Echocardiography is a painless test that uses sound waves to create images of your heart. It provides your doctor with information about the size and shape of your heart and how well your heart's chambers and valves are working. This procedure takes approximately one hour. There are no restrictions for this procedure. [place] [address]  Your physician has requested that you have en exercise stress myoview. For further information please visit [email or web address]. Please follow instruction sheet, as given.  Follow-Up: Your physician wants you to follow-up in: 1 YEAR  You will receive a reminder letter in the mail two months in advance. If you don't receive a letter, please call our office to schedule the follow-up appointment.  Cardiac Nuclear Scan A cardiac nuclear scan is a test that measures blood flow to the heart when a person is resting and when he or she is exercising. The test looks for problems such as:  Not enough blood reaching a portion of the heart.  The heart muscle not working normally.  You may need this test if:  You have heart disease.  You have had abnormal lab results.  You have had heart surgery or angioplasty.  You have chest pain.  You have shortness of breath.  In this test, a radioactive dye (tracer) is injected into your bloodstream. After the tracer has traveled to your heart, an imaging device is used to measure how much of the tracer is absorbed by or distributed to various areas of your heart. This procedure is usually done at a hospital and takes 2-4 hours. Tell a health care provider about:  Any allergies you have.  All medicines you are taking, including  vitamins, herbs, eye drops, creams, and over-the-counter medicines.  Any problems you or family members have had with the use of anesthetic medicines.  Any blood disorders you have.  Any surgeries you have had.  Any medical conditions you have.  Whether you are pregnant or may be pregnant. What are the risks? Generally, this is a safe procedure. However, problems may occur, including:  Serious chest pain and heart attack. This is only a risk if the stress portion of the test is done.  Rapid heartbeat.  Sensation of warmth in your chest. This usually passes quickly.  What happens before the procedure?  Ask your health care provider about changing or stopping your regular medicines. This is especially important if you are taking diabetes medicines or blood thinners.  Remove your jewelry on the day of the procedure. What happens during the procedure?  An IV tube will be inserted into one of your veins.  Your health care provider will inject a small amount of radioactive tracer through the tube.  You will wait for 20-40 minutes while the tracer travels through your bloodstream.  Your heart activity will be monitored with an electrocardiogram (ECG).  You will lie down on an exam table.  Images of your heart will be taken for about 15-20 minutes.  You may be asked to exercise on a treadmill or stationary bike. While you exercise, your heart's activity will be monitored with an ECG, and your blood pressure will be checked. If you are unable to exercise, you may be given a medicine to increase blood  flow to parts of your heart.  When blood flow to your heart has peaked, a tracer will again be injected through the IV tube.  After 20-40 minutes, you will get back on the exam table and have more images taken of your heart.  When the procedure is over, your IV tube will be removed. The procedure may vary among health care providers and hospitals. Depending on the type of tracer  used, scans may need to be repeated 3-4 hours later. What happens after the procedure?  Unless your health care provider tells you otherwise, you may return to your normal schedule, including diet, activities, and medicines.  Unless your health care provider tells you otherwise, you may increase your fluid intake. This will help flush the contrast dye from your body. Drink enough fluid to keep your urine clear or pale yellow.  It is up to you to get your test results. Ask your health care provider, or the department that is doing the test, when your results will be ready. Summary  A cardiac nuclear scan measures the blood flow to the heart when a person is resting and when he or she is exercising.  You may need this test if you are at risk for heart disease.  Tell your health care provider if you are pregnant.  Unless your health care provider tells you otherwise, increase your fluid intake. This will help flush the contrast dye from your body. Drink enough fluid to keep your urine clear or pale yellow. This information is not intended to replace advice given to you by your health care provider. Make sure you discuss any questions you have with your health care provider. Document Released: 03/14/2004 Document Revised: 02/20/2016 Document Reviewed: 01/26/2013 Elsevier Interactive Patient Education  2017 [employer].   Echocardiogram An echocardiogram, or echocardiography, uses sound waves (ultrasound) to produce an image of your heart. The echocardiogram is simple, painless, obtained within a short period of time, and offers valuable information to your health care provider. The images from an echocardiogram can provide information such as:  Evidence of coronary artery disease (CAD).  Heart size.  Heart muscle function.  Heart valve function.  Aneurysm detection.  Evidence of a past heart attack.  Fluid buildup around the heart.  Heart muscle thickening.  Assess heart valve  function.  Tell a health care provider about:  Any allergies you have.  All medicines you are taking, including vitamins, herbs, eye drops, creams, and over-the-counter medicines.  Any problems you or family members have had with anesthetic medicines.  Any blood disorders you have.  Any surgeries you have had.  Any medical conditions you have.  Whether you are pregnant or may be pregnant. What happens before the procedure? No special preparation is needed. Eat and drink normally. What happens during the procedure?  In order to produce an image of your heart, gel will be applied to your chest and a wand-like tool (transducer) will be moved over your chest. The gel will help transmit the sound waves from the transducer. The sound waves will harmlessly bounce off your heart to allow the heart images to be captured in real-time motion. These images will then be recorded.  You may need an IV to receive a medicine that improves the quality of the pictures. What happens after the procedure? You may return to your normal schedule including diet, activities, and medicines, unless your health care provider tells you otherwise. This information is not intended to replace advice given to  you by your health care provider. Make sure you discuss any questions you have with your health care provider. Document Released: 02/15/2000 Document Revised: 10/06/2015 Document Reviewed: 10/25/2012 Elsevier Interactive Patient Education  2017 [employer].

## 2017-11-10 ENCOUNTER — Telehealth (HOSPITAL_COMMUNITY): Payer: Self-pay

## 2017-11-10 NOTE — Telephone Encounter (Signed)
Encounter complete. 

## 2017-11-11 ENCOUNTER — Telehealth (HOSPITAL_COMMUNITY): Payer: Self-pay

## 2017-11-11 NOTE — Telephone Encounter (Signed)
Encounter complete. 

## 2017-11-12 ENCOUNTER — Ambulatory Visit (HOSPITAL_COMMUNITY)
Admission: RE | Admit: 2017-11-12 | Discharge: 2017-11-12 | Disposition: A | Discharge status: Home / Self Care | Payer: 59 | Source: Ambulatory Visit | Attending: Cardiology | Admitting: Cardiology

## 2017-11-12 DIAGNOSIS — I493 Ventricular premature depolarization: Secondary | ICD-10-CM

## 2017-11-12 DIAGNOSIS — R5383 Other fatigue: Secondary | ICD-10-CM | POA: Diagnosis present

## 2017-11-12 DIAGNOSIS — I712 Thoracic aortic aneurysm, without rupture: Secondary | ICD-10-CM | POA: Diagnosis present

## 2017-11-12 DIAGNOSIS — R0609 Other forms of dyspnea: Secondary | ICD-10-CM | POA: Diagnosis present

## 2017-11-12 DIAGNOSIS — I7121 Aneurysm of the ascending aorta, without rupture: Secondary | ICD-10-CM

## 2017-11-12 LAB — MYOCARDIAL PERFUSION IMAGING
CHL CUP RESTING HR STRESS: 64 {beats}/min
LV sys vol: 61 mL
LVDIAVOL: 142 mL (ref 62–150)
NUC STRESS TID: 1.42
Peak HR: 84 {beats}/min
SDS: 3
SRS: 0
SSS: 3

## 2017-11-12 MED ORDER — TECHNETIUM TC 99M TETROFOSMIN IV KIT
31.1000 | PACK | Freq: Once | INTRAVENOUS | Status: AC | PRN
  Administered 2017-11-12: 31.1 via INTRAVENOUS
  Filled 2017-11-12: qty 32

## 2017-11-12 MED ORDER — TECHNETIUM TC 99M TETROFOSMIN IV KIT
9.9000 | PACK | Freq: Once | INTRAVENOUS | Status: AC | PRN
  Administered 2017-11-12: 9.9 via INTRAVENOUS
  Filled 2017-11-12: qty 10

## 2017-11-12 MED ORDER — REGADENOSON 0.4 MG/5ML IV SOLN
0.4000 mg | Freq: Once | INTRAVENOUS | Status: AC
  Administered 2017-11-12: 0.4 mg via INTRAVENOUS

## 2017-11-16 ENCOUNTER — Other Ambulatory Visit: Payer: Self-pay

## 2017-11-16 ENCOUNTER — Ambulatory Visit (HOSPITAL_COMMUNITY): Payer: 59 | Attending: Cardiology

## 2017-11-16 DIAGNOSIS — R5383 Other fatigue: Secondary | ICD-10-CM | POA: Diagnosis not present

## 2017-11-16 DIAGNOSIS — E119 Type 2 diabetes mellitus without complications: Secondary | ICD-10-CM | POA: Diagnosis not present

## 2017-11-16 DIAGNOSIS — E785 Hyperlipidemia, unspecified: Secondary | ICD-10-CM | POA: Diagnosis not present

## 2017-11-16 DIAGNOSIS — R0600 Dyspnea, unspecified: Secondary | ICD-10-CM | POA: Diagnosis present

## 2017-11-16 DIAGNOSIS — R0609 Other forms of dyspnea: Secondary | ICD-10-CM | POA: Diagnosis not present

## 2017-11-16 DIAGNOSIS — Z6833 Body mass index (BMI) 33.0-33.9, adult: Secondary | ICD-10-CM | POA: Insufficient documentation

## 2017-11-16 DIAGNOSIS — G4733 Obstructive sleep apnea (adult) (pediatric): Secondary | ICD-10-CM | POA: Insufficient documentation

## 2017-11-16 DIAGNOSIS — E669 Obesity, unspecified: Secondary | ICD-10-CM | POA: Insufficient documentation

## 2017-11-16 DIAGNOSIS — I712 Thoracic aortic aneurysm, without rupture: Secondary | ICD-10-CM | POA: Diagnosis not present

## 2017-11-16 DIAGNOSIS — I493 Ventricular premature depolarization: Secondary | ICD-10-CM | POA: Diagnosis not present

## 2017-11-16 DIAGNOSIS — I119 Hypertensive heart disease without heart failure: Secondary | ICD-10-CM | POA: Diagnosis not present

## 2017-11-16 DIAGNOSIS — I7121 Aneurysm of the ascending aorta, without rupture: Secondary | ICD-10-CM

## 2017-11-23 ENCOUNTER — Other Ambulatory Visit: Payer: Self-pay | Admitting: Internal Medicine

## 2017-11-23 ENCOUNTER — Telehealth: Payer: Self-pay | Admitting: Internal Medicine

## 2017-11-23 ENCOUNTER — Other Ambulatory Visit: Payer: Self-pay

## 2017-11-23 MED ORDER — INSULIN LISPRO 100 UNIT/ML ~~LOC~~ SOLN: SUBCUTANEOUS | 4 refills | Status: DC

## 2017-11-23 NOTE — Telephone Encounter (Signed)
RX sent

## 2017-11-23 NOTE — Telephone Encounter (Signed)
Pt called about needing to refill insulin lispro (HUMALOG) 100 UNIT/ML injection.  Pharmacy is [facility] [place]  Call pt @ [phone]. Thank you!

## 2017-11-27 ENCOUNTER — Other Ambulatory Visit: Payer: Self-pay

## 2017-11-27 ENCOUNTER — Telehealth: Payer: Self-pay | Admitting: Internal Medicine

## 2017-11-27 MED ORDER — INSULIN LISPRO 100 UNIT/ML ~~LOC~~ SOLN: SUBCUTANEOUS | 4 refills | Status: DC

## 2017-11-29 ENCOUNTER — Encounter: Payer: Self-pay | Admitting: Internal Medicine

## 2017-11-29 DIAGNOSIS — Z8249 Family history of ischemic heart disease and other diseases of the circulatory system: Secondary | ICD-10-CM | POA: Insufficient documentation

## 2017-11-29 NOTE — Patient Instructions (Signed)

## 2017-11-29 NOTE — Progress Notes (Signed)
This very nice 62 y.o. 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      Patient is treated for HTN (2000)  & BP has been controlled at home. Today's BP is at goal - 124/76. Patient has has had a negative 2D echo & Stress myoview in the past with Dr Aundra Dubin. Patient is also known too have a 4 cm thoracic ascending Ao Aneurysm by MRI angio in 2016. Patient was seen recently by Dr Skeet Latch and had repeat Echocardiogram and Stress test which were essentially Normal. Patient does endorse late day fatigue, but has had no complaints of any cardiac type chest pain, palpitations, dyspnea / orthopnea / PND, dizziness, claudication, or dependent edema.     Hyperlipidemia dx'd circa 2000 is controlled with diet & meds. Patient denies myalgias or other med SE's. 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  Patient denies recent symptoms of reactive hypoglycemia, diabetic polys, paresthesias or visual blurring.  Last A1c was near goal: Lab Results  Component Value Date   HGBA1C 6.2 (A) 09/29/2017      Further, the patient also has history of Vitamin D Deficiency (32"/2012)  and supplements vitamin D sporadically. Last vitamin D was again very low: Lab Results  Component Value Date   VD25OH 34 08/04/2017   Current Outpatient Medications on File Prior to Visit  Medication Sig  . aspirin 81 MG tablet Take 81 mg by mouth daily.  Marland Kitchen atorvastatin (LIPITOR) 40 MG tablet TAKE 1 TABLET DAILY FOR CHOLESTEROL  . bisoprolol-hydrochlorothiazide (ZIAC) 5-6.25 MG tablet TAKE 1 TABLET DAILY FOR BLOOD PRESSURE  . cholecalciferol  (VITAMIN D) 1000 units tablet Take 1,000 Units by mouth daily.  . Dulaglutide (TRULICITY) 1.5 PR/9.1MB SOPN Inject 1.5 mg into the skin once a week.  Marland Kitchen glucose 4 GM chewable tablet Chew 1 tablet (4 g total) by mouth as needed for low blood sugar.  . insulin lispro (HUMALOG) 100 UNIT/ML injection INJECT 60 TO 80 UNITS UNDER THE SKIN PER DAY WITH MEALS DEPENDING ON MEALS  . Insulin Syringe-Needle U-100 (BD INSULIN SYRINGE U/F) 31G X 5/16" 0.5 ML MISC Inject Insulin 2 x /day as directed  . LANTUS 100 UNIT/ML injection INJECT 60 UNITS UNDER THE SKIN AT BEDTIME (Patient taking differently: Inject into the skin 2 (two) times daily. )  . omeprazole (PRILOSEC) 20 MG capsule TAKE 1 CAPSULE DAILY FOR ACID REFLUX  . ONE TOUCH ULTRA TEST test strip USE 3 TO 4 TIMES A DAY FOR FLUCTUATING BLOOD SUGARS  . potassium chloride SA (K-DUR,KLOR-CON) 20 MEQ tablet TAKE 1 TABLET TWICE A DAY  . tadalafil (CIALIS) 20 MG tablet Take 1 tablet (20 mg total) by mouth daily as needed for erectile dysfunction.  Marland Kitchen losartan-hydrochlorothiazide (HYZAAR) 100-12.5 MG tablet TAKE 1 TABLET DAILY (Patient not taking: Reported on 11/30/2017)  . metFORMIN (GLUCOPHAGE-XR) 500 MG 24 hr tablet Take 2 tablets 2 x / day with food for Diabetes   No current facility-administered medications on file prior to visit.    Allergies  Allergen Reactions  .  Crestor [Rosuvastatin] Cough   PMHx:   Past Medical History:  Diagnosis Date  . CKD (chronic kidney disease) stage 2, GFR 60-89 ml/min    secondary to DM  . Diabetes mellitus without complication ([place]) 4098   insulin requiring  . GERD (gastroesophageal reflux disease)   . Hyperlipidemia   . Hypertension   . Hypokalemia   . Hypomagnesemia   . OSA on CPAP    10-12 years ago  . Vitamin D deficiency    Immunization History  Administered Date(s) Administered  . Influenza Inj Mdck Quad With Preservative 12/19/2016  . Influenza Split 11/28/2013, 12/06/2014  . Influenza-Unspecified  12/19/2011, 01/04/2013, 01/01/2016  . Pneumococcal Polysaccharide-23 03/03/2006  . Td 03/03/2008  . Zoster 02/07/2011   Past Surgical History:  Procedure Laterality Date  . ANAL FISSURE REPAIR  2007  . VENTRAL HERNIA REPAIR  04/13/12   Dr. Johney Maine, ventral hernia repair w/ mesh   FHx:    Reviewed / unchanged  SHx:    Reviewed / unchanged   Systems Review:  Constitutional: Denies fever, chills, wt changes, headaches, insomnia, fatigue, night sweats, change in appetite. Eyes: Denies redness, blurred vision, diplopia, discharge, itchy, watery eyes.  ENT: Denies discharge, congestion, post nasal drip, epistaxis, sore throat, earache, hearing loss, dental pain, tinnitus, vertigo, sinus pain, snoring.  CV: Denies chest pain, palpitations, irregular heartbeat, syncope, dyspnea, diaphoresis, orthopnea, PND, claudication or edema. Respiratory: denies cough, dyspnea, DOE, pleurisy, hoarseness, laryngitis, wheezing.  Gastrointestinal: Denies dysphagia, odynophagia, heartburn, reflux, water brash, abdominal pain or cramps, nausea, vomiting, bloating, diarrhea, constipation, hematemesis, melena, hematochezia  or hemorrhoids. Genitourinary: Denies dysuria, frequency, urgency, nocturia, hesitancy, discharge, hematuria or flank pain. Musculoskeletal: Denies arthralgias, myalgias, stiffness, jt. swelling, pain, limping or strain/sprain.  Skin: Denies pruritus, rash, hives, warts, acne, eczema or change in skin lesion(s). Neuro: No weakness, tremor, incoordination, spasms, paresthesia or pain. Psychiatric: Denies confusion, memory loss or sensory loss. Endo: Denies change in weight, skin or hair change.  Heme/Lymph: No excessive bleeding, bruising or enlarged lymph nodes.  Physical Exam  BP 124/76   Pulse 72   Temp (!) 97.1 F (36.2 C)   Resp 18   Ht 6' (1.829 m)   Wt 260 lb (117.9 kg)   BMI 35.26 kg/m   Appears  Over nourished, well groomed  and in no distress.  Eyes: PERRLA, EOMs,  conjunctiva no swelling or erythema. Sinuses: No frontal/maxillary tenderness ENT/Mouth: EAC's clear, TM's nl w/o erythema, bulging. Nares clear w/o erythema, swelling, exudates. Oropharynx clear without erythema or exudates. Oral hygiene is good. Tongue normal, non obstructing. Hearing intact.  Neck: Supple. Thyroid not palpable. Car 2+/2+ without bruits, nodes or JVD. Chest: Respirations nl with BS clear & equal w/o rales, rhonchi, wheezing or stridor.  Cor: Heart sounds normal w/ regular rate and rhythm without sig. murmurs, gallops, clicks or rubs. Peripheral pulses normal and equal  without edema.  Abdomen: Soft & bowel sounds normal. Non-tender w/o guarding, rebound, hernias, masses or organomegaly.  Lymphatics: Unremarkable.  Musculoskeletal: Full ROM all peripheral extremities, joint stability, 5/5 strength and normal gait.  Skin: Warm, dry without exposed rashes, lesions or ecchymosis apparent.  Neuro: Cranial nerves intact, reflexes equal bilaterally. Sensory-motor testing grossly intact. Tendon reflexes grossly intact.  Pysch: Alert & oriented x 3.  Insight and judgement nl & appropriate. No ideations.  Assessment and Plan:  1. Essential hypertension  - Continue medication, monitor blood pressure at home.  - Continue DASH diet.  Reminder to go to the ER  if any CP,  SOB, nausea, dizziness, severe HA, changes vision/speech.  - CBC with Differential/Platelet - COMPLETE METABOLIC PANEL WITH GFR - Magnesium - TSH  2. Hyperlipidemia, mixed  - Continue diet/meds, exercise,& lifestyle modifications.  - Continue monitor periodic cholesterol/liver & renal functions   - Lipid panel - TSH  3. 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  - Monitor appropriate labs.  - COMPLETE METABOLIC PANEL WITH GFR  4. Vitamin D deficiency  - Continue supplementation.   - VITAMIN D 25 Hydroxyl  5.  Gastroesophageal reflux disease  - CBC with Differential/Platelet  6. OSA on CPAP  7. Medication management  - CBC with Differential/Platelet - COMPLETE METABOLIC PANEL WITH GFR - Magnesium - Lipid panel - TSH - VITAMIN D 25 Hydroxyl      Discussed  regular exercise, BP monitoring, weight control to achieve/maintain BMI less than 25 and discussed med and SE's. Recommended labs to assess and monitor clinical status with further disposition pending results of labs. Over 30 minutes of exam, counseling, chart review was performed.

## 2017-11-30 ENCOUNTER — Telehealth: Payer: Self-pay

## 2017-11-30 ENCOUNTER — Other Ambulatory Visit: Payer: Self-pay

## 2017-11-30 ENCOUNTER — Telehealth: Payer: Self-pay | Admitting: Internal Medicine

## 2017-11-30 ENCOUNTER — Ambulatory Visit: Payer: 59 | Admitting: Internal Medicine

## 2017-11-30 VITALS — BP 124/76 | HR 72 | Temp 97.1°F | Resp 18 | Ht 72.0 in | Wt 260.0 lb

## 2017-11-30 DIAGNOSIS — E1022 Type 1 diabetes mellitus with diabetic chronic kidney disease: Secondary | ICD-10-CM | POA: Diagnosis not present

## 2017-11-30 DIAGNOSIS — E559 Vitamin D deficiency, unspecified: Secondary | ICD-10-CM | POA: Diagnosis not present

## 2017-11-30 DIAGNOSIS — Z79899 Other long term (current) drug therapy: Secondary | ICD-10-CM

## 2017-11-30 DIAGNOSIS — Z9989 Dependence on other enabling machines and devices: Secondary | ICD-10-CM

## 2017-11-30 DIAGNOSIS — E782 Mixed hyperlipidemia: Secondary | ICD-10-CM | POA: Diagnosis not present

## 2017-11-30 DIAGNOSIS — I10 Essential (primary) hypertension: Secondary | ICD-10-CM | POA: Diagnosis not present

## 2017-11-30 DIAGNOSIS — K2100 Gastro-esophageal reflux disease with esophagitis, without bleeding: Secondary | ICD-10-CM

## 2017-11-30 DIAGNOSIS — N182 Chronic kidney disease, stage 2 (mild): Secondary | ICD-10-CM

## 2017-11-30 DIAGNOSIS — G4733 Obstructive sleep apnea (adult) (pediatric): Secondary | ICD-10-CM

## 2017-11-30 MED ORDER — INSULIN LISPRO 100 UNIT/ML ~~LOC~~ SOLN: SUBCUTANEOUS | 4 refills | Status: DC

## 2017-11-30 NOTE — Telephone Encounter (Signed)
Please see other open phone note.

## 2017-11-30 NOTE — Telephone Encounter (Signed)
Patient notified

## 2017-11-30 NOTE — Telephone Encounter (Signed)
Pt is stating they are needing insulin lispro (HUMALOG) 100 UNIT/ML injection refilled. [facility] is not receiving anything. PT states we will be receiving a fax from the. He is out. Please advice. Ph # [phone]

## 2017-11-30 NOTE — Telephone Encounter (Signed)
Chart reviewed, it was ordered on 9/27 but not electronically sent to pharmacy.  I have sent in new one.

## 2017-11-30 NOTE — Telephone Encounter (Signed)
Patient is here to pick up a sample of humalog and need to find out why his [facility] is being rejected

## 2017-12-01 LAB — LIPID PANEL
Cholesterol: 144 mg/dL (ref <200)
HDL: 49 mg/dL (ref >40)
LDL CHOLESTEROL (CALC): 74 mg/dL
Non-HDL Cholesterol (Calc): 95 mg/dL (calc) (ref <130)
TRIGLYCERIDES: 124 mg/dL (ref <150)
Total CHOL/HDL Ratio: 2.9 (calc) (ref <5.0)

## 2017-12-01 LAB — CBC WITH DIFFERENTIAL/PLATELET
Basophils Absolute: 82 cells/uL (ref 0–200)
Basophils Relative: 1.2 %
EOS PCT: 1.9 %
Eosinophils Absolute: 129 cells/uL (ref 15–500)
HCT: 45.5 % (ref 38.5–50.0)
Hemoglobin: 15.1 g/dL (ref 13.2–17.1)
Lymphs Abs: 1251 cells/uL (ref 850–3900)
MCH: 28 pg (ref 27.0–33.0)
MCHC: 33.2 g/dL (ref 32.0–36.0)
MCV: 84.3 fL (ref 80.0–100.0)
MONOS PCT: 9.2 %
MPV: 11.3 fL (ref 7.5–12.5)
NEUTROS ABS: 4712 {cells}/uL (ref 1500–7800)
Neutrophils Relative %: 69.3 %
PLATELETS: 229 10*3/uL (ref 140–400)
RBC: 5.4 10*6/uL (ref 4.20–5.80)
RDW: 13 % (ref 11.0–15.0)
TOTAL LYMPHOCYTE: 18.4 %
WBC mixed population: 626 cells/uL (ref 200–950)
WBC: 6.8 10*3/uL (ref 3.8–10.8)

## 2017-12-01 LAB — COMPLETE METABOLIC PANEL WITH GFR
AG Ratio: 1.6 (calc) (ref 1.0–2.5)
ALKALINE PHOSPHATASE (APISO): 80 U/L (ref 40–115)
ALT: 16 U/L (ref 9–46)
AST: 17 U/L (ref 10–35)
Albumin: 3.9 g/dL (ref 3.6–5.1)
BILIRUBIN TOTAL: 1 mg/dL (ref 0.2–1.2)
BUN: 22 mg/dL (ref 7–25)
CHLORIDE: 101 mmol/L (ref 98–110)
CO2: 30 mmol/L (ref 20–32)
CREATININE: 1.02 mg/dL (ref 0.70–1.25)
Calcium: 9.3 mg/dL (ref 8.6–10.3)
GFR, EST AFRICAN AMERICAN: 90 mL/min/{1.73_m2} (ref >=60)
GFR, Est Non African American: 78 mL/min/{1.73_m2} (ref >=60)
GLUCOSE: 177 mg/dL — AB (ref 65–99)
Globulin: 2.5 g/dL (calc) (ref 1.9–3.7)
Potassium: 3.7 mmol/L (ref 3.5–5.3)
Sodium: 138 mmol/L (ref 135–146)
TOTAL PROTEIN: 6.4 g/dL (ref 6.1–8.1)

## 2017-12-01 LAB — TSH: TSH: 1.62 mIU/L (ref 0.40–4.50)

## 2017-12-01 LAB — MAGNESIUM: MAGNESIUM: 1.8 mg/dL (ref 1.5–2.5)

## 2017-12-01 LAB — VITAMIN D 25 HYDROXY (VIT D DEFICIENCY, FRACTURES): Vit D, 25-Hydroxy: 38 ng/mL (ref 30–100)

## 2017-12-07 ENCOUNTER — Telehealth: Payer: Self-pay | Admitting: Internal Medicine

## 2017-12-07 NOTE — Telephone Encounter (Signed)
patient called to report his 62 yr old CPAP machine has error message "motor life exceeded". faxed order to [facility] for CPAP replacement, per Dr Melford Aase.

## 2017-12-07 NOTE — Telephone Encounter (Signed)
Error

## 2017-12-09 ENCOUNTER — Other Ambulatory Visit: Payer: Self-pay | Admitting: Internal Medicine

## 2017-12-09 ENCOUNTER — Telehealth: Payer: Self-pay | Admitting: Internal Medicine

## 2017-12-09 DIAGNOSIS — G4733 Obstructive sleep apnea (adult) (pediatric): Secondary | ICD-10-CM

## 2017-12-09 DIAGNOSIS — Z9989 Dependence on other enabling machines and devices: Principal | ICD-10-CM

## 2017-12-09 NOTE — Telephone Encounter (Signed)
Patient requesting new CPAP machine. Current machine does not work properly. Per [facility], Barbaraann Rondo, since no sleep study can be located from 10-12 years ago and the insurance requirement to repeat study about every 5 years, a new sleep study will be necessary.

## 2017-12-14 ENCOUNTER — Other Ambulatory Visit: Payer: Self-pay | Admitting: Internal Medicine

## 2017-12-14 ENCOUNTER — Other Ambulatory Visit: Payer: Self-pay | Admitting: Adult Health

## 2017-12-28 ENCOUNTER — Encounter: Payer: Self-pay | Admitting: Neurology

## 2017-12-29 ENCOUNTER — Ambulatory Visit (INDEPENDENT_AMBULATORY_CARE_PROVIDER_SITE_OTHER): Payer: 59 | Admitting: Neurology

## 2017-12-29 ENCOUNTER — Encounter: Payer: Self-pay | Admitting: Neurology

## 2017-12-29 VITALS — BP 152/93 | HR 68 | Ht 73.5 in | Wt 262.0 lb

## 2017-12-29 DIAGNOSIS — E669 Obesity, unspecified: Secondary | ICD-10-CM | POA: Diagnosis not present

## 2017-12-29 DIAGNOSIS — E1140 Type 2 diabetes mellitus with diabetic neuropathy, unspecified: Secondary | ICD-10-CM

## 2017-12-29 DIAGNOSIS — R5383 Other fatigue: Secondary | ICD-10-CM | POA: Diagnosis not present

## 2017-12-29 DIAGNOSIS — G4733 Obstructive sleep apnea (adult) (pediatric): Secondary | ICD-10-CM | POA: Insufficient documentation

## 2017-12-29 DIAGNOSIS — Z9989 Dependence on other enabling machines and devices: Secondary | ICD-10-CM

## 2017-12-29 DIAGNOSIS — Z794 Long term (current) use of insulin: Secondary | ICD-10-CM

## 2017-12-29 MED ORDER — ALPRAZOLAM 1 MG PO TABS: 1.0000 mg | ORAL_TABLET | Freq: Every evening | ORAL | 0 refills | Status: DC | PRN

## 2017-12-29 NOTE — Progress Notes (Signed)
SLEEP MEDICINE CLINIC   Provider:  Larey Seat, M D  Primary Care Physician:  Unk Pinto, MD   Referring Provider: Unk Pinto, MD   Chief Complaint  Patient presents with  . New Patient (Initial Visit)    pt alone, rm 11. pt presents today to transfer sleep care - establish care for sleep. The patient had no follow up with any MD after sleep study. last sleep study completed 15+ years ago  at [facility] after  which he started CPAP therapy. His current machine he has is over 86 years old and states it has exceeded its motor life. DME [facility].     HPI:  Harry [name] is a 62 y.o. male patient, seen here on 12-29-2017 for establishing sleep care, referral from Dr. Melford Aase.  Chief complaint according to patient : " I need to get a new machine"   I have the pleasure of meeting Harry [name] today, a 62 year old right-handed Caucasian gentleman with a history of obstructive sleep apnea treated by CPAP for over 15 years.  He recalled that his last and only sleep study was at the [place] location that he started CPAP after that, his current machine is well over 63 years old.  It is however an S9 AutoSet with a variable pressure setting, a rather advanced machine.  He is continuing to use it but it recently displayed a message that it was close to the end of his motor life.  He has been 100% compliant with an average of 8 hours and 5 minutes the AutoSet has a minimum pressure of 8, maximum pressure of 16 cmH2O and 2 cm expiratory pressure relief, his residual AHI is 2.0 equal amount of central and obstructive residual apneas as noted.  He uses a 95th percentile pressure of 11.2 cmH2O.  He does not have major air leaks, he is using a nasal mask, Mirage Fx small size. 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 Home by 6.30 pm , his Dinner between 7 and 8 Pm, and followed by watching TV. Bedtime is 10-11 pm. The bedroom  is cool, quiet, and dark , and shared with his wife. Small dog sleeps in the bed. No problems to initiate sleep, as long as CPAP is on. Sleeps on his back and sides, and on 2 pillows, flat bed. Sleeps until 4 am for one bathroom break, and goes immediately back to sleep. He dreams but has no nightmares. No dream enactment. He rises sponatanously at 6.30 AM. He feels refreshed but sometimes noted a dry mouth. No headaches, pain- as long as using CPAP>   Sleep medical history and family sleep history:  Frequent nasal congestion, rhinitis, not sinusitis. OSA on CPAP. Obesity.  Patient's blood pressure has been well controlled at home, he had a negative 2D echocardiogram and stress Myoview in the past with Dr. Johnny Bridge and now has follow-up for MRI NGO 2016 with [name].  Essentially normal repeated echocardiogram and stress test.  He does not have any chest pain, tightness, dyspnea shortness of breath dizziness or ankle edema.  He does not feel palpitations.  His HbA1c on 30 July was 6.2, he is considered diabetic, he is using insulin Lantus and Humalog.  He is on losartan hydrochlorothiazide, and metformin 500 mg 2 tablets twice daily, he is also diagnosed with GERD, hyperlipidemia, hypokalemia, hypomagnesemia in the past  and vitamin D deficiency.  He is up-to-date with all vaccinations he needs.  He had a ventral abdominal hernia repair in February 2014.  Social history:  Married, full time employed. 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  2 adult children- daughter in [place] , son resides in [place] and is a Chief Strategy Officer in [place]. Has a one year old daughter.   Review of Systems: Out of a complete 14 system review, the patient complains of only the following symptoms, and all other reviewed systems are negative. Nasal voice. More fatigued,   Epworth score  2/ 24 on CPAP  , Fatigue severity score 24/ 63 , Geriatric depression score 2/ 15   Social History    Socioeconomic History  . Marital status: Married    Spouse name: Not on file  . Number of children: 2  . Years of education: Not on file  . Highest education level: Not on file  Occupational History  . Not on file  Social Needs  . Financial resource strain: Not on file  . Food insecurity:    Worry: Not on file    Inability: Not on file  . Transportation needs:    Medical: Not on file    Non-medical: Not on file  Tobacco Use  . Smoking status: Never Smoker  . Smokeless tobacco: Never Used  Substance and Sexual Activity  . Alcohol use: Yes    Alcohol/week: 2.0 - 5.0 standard drinks    Types: 2 - 5 Cans of beer per week    Comment: weekly  . Drug use: No  . Sexual activity: Yes  Lifestyle  . Physical activity:    Days per week: 1 day    Minutes per session: 50 min  . Stress: To some extent  Relationships  . Social connections:    Talks on phone: Not on file    Gets together: Not on file    Attends religious service: Not on file    Active member of club or organization: Not on file    Attends meetings of clubs or organizations: Not on file    Relationship status: Not on file  . Intimate partner violence:    Fear of current or ex partner: Not on file    Emotionally abused: Not on file    Physically abused: Not on file    Forced sexual activity: Not on file  Other Topics Concern  . Not on file  Social History Narrative  . Not on file    Family History  Problem Relation Age of Onset  . Kidney cancer Father        kidney  . Stroke Maternal Grandmother   . Dementia Paternal Grandmother        Early 74s  . Cancer Paternal Wynell Balloon of age or type  . Cancer Paternal Uncle     Past Medical History:  Diagnosis Date  . CKD (chronic kidney disease) stage 2, GFR 60-89 ml/min    secondary to DM  . Diabetes mellitus without complication ([place]) 3086   insulin requiring  . GERD (gastroesophageal reflux disease)   . Hyperlipidemia   . Hypertension    . Hypokalemia   . Hypomagnesemia   . OSA on CPAP    10-12 years ago  . Vitamin D deficiency     Past Surgical History:  Procedure Laterality Date  . ANAL FISSURE REPAIR  2007  . VENTRAL HERNIA REPAIR  04/13/12   Dr. Johney Maine, ventral hernia repair w/ mesh    Current Outpatient Medications  Medication Sig Dispense Refill  . aspirin 81 MG tablet Take 81 mg by mouth daily.    Marland Kitchen atorvastatin (LIPITOR) 40 MG tablet TAKE 1 TABLET DAILY FOR CHOLESTEROL 90 tablet 0  . bisoprolol-hydrochlorothiazide (ZIAC) 5-6.25 MG tablet TAKE 1 TABLET DAILY FOR BLOOD PRESSURE 90 tablet 1  . Cholecalciferol (VITAMIN D PO) Take 5,000 Units by mouth daily.     . Dulaglutide (TRULICITY) 1.5 XN/2.3FT SOPN Inject 1.5 mg into the skin once a week. 6 mL 0  . glucose 4 GM chewable tablet Chew 1 tablet (4 g total) by mouth as needed for low blood sugar. 30 tablet 12  . insulin lispro (HUMALOG) 100 UNIT/ML injection INJECT 60 TO 80 UNITS UNDER THE SKIN PER DAY WITH MEALS DEPENDING ON MEALS (Patient taking differently: Inject 15-25 Units into the skin 3 (three) times daily with meals. ) 210 mL 4  . Insulin Syringe-Needle U-100 (BD INSULIN SYRINGE U/F) 31G X 5/16" 0.5 ML MISC Inject Insulin 2 x /day as directed 200 each 99  . LANTUS 100 UNIT/ML injection INJECT 60 UNITS UNDER THE SKIN AT BEDTIME (Patient taking differently: Inject 25 Units into the skin 2 (two) times daily. ) 60 mL 4  . losartan-hydrochlorothiazide (HYZAAR) 100-12.5 MG tablet TAKE 1 TABLET DAILY 90 tablet 1  . Magnesium 500 MG TABS Take 1 tablet by mouth daily.    . metFORMIN (GLUCOPHAGE-XR) 500 MG 24 hr tablet TAKE 2 TABLETS TWICE A DAY WITH FOOD FOR DIABETES 360 tablet 4  . omeprazole (PRILOSEC) 20 MG capsule TAKE 1 CAPSULE DAILY FOR ACID REFLUX 90 capsule 3  . ONE TOUCH ULTRA TEST test strip USE 3 TO 4 TIMES A DAY FOR FLUCTUATING BLOOD SUGARS 400 each 3  . potassium chloride SA (K-DUR,KLOR-CON) 20 MEQ tablet TAKE 1 TABLET TWICE A DAY 180 tablet 4  .  tadalafil (CIALIS) 20 MG tablet Take 1 tablet (20 mg total) by mouth daily as needed for erectile dysfunction. 60 tablet 3   No current facility-administered medications for this visit.     Allergies as of 12/29/2017 - Review Complete 12/29/2017  Allergen Reaction Noted  . Crestor [rosuvastatin] Cough 12/29/2012    Vitals: BP (!) 152/93   Pulse 68   Ht 6' 1.5" (1.867 m)   Wt 262 lb (118.8 kg)   BMI 34.10 kg/m  Last Weight:  Wt Readings from Last 1 Encounters:  12/29/17 262 lb (118.8 kg)   DDU:KGUR mass index is 34.1 kg/m.     Last Height:   Ht Readings from Last 1 Encounters:  12/29/17 6' 1.5" (1.867 m)    Physical exam:  General: The patient is awake, alert and appears not in acute distress. The patient is well groomed. Head: Normocephalic, atraumatic. Neck is supple. Mallampati 4 - neck circumference:19" . Nasal airflow congested, TMJ is not  evident . Retrognathia is not seen. Cardiovascular:  Regular rate and rhythm , without  murmurs or carotid bruit, and without distended neck veins. Respiratory: Lungs are clear to auscultation. Skin:  Without evidence of edema, or rash.Trunk: BMI is 34 . The patient's posture is erect.   Neurologic exam : The patient is awake and alert, oriented to place and time.   Memory subjective described as intact.  Attention span & concentration ability appears normal.  Speech is fluent, without dysarthria, but with dysphonia.  Mood and affect are appropriate.  Cranial nerves: Pupils  are equal and briskly reactive to light.  Funduscopic exam without evidence of pallor or edema.  Extraocular movements  in vertical and horizontal planes intact and without nystagmus. Visual fields by finger perimetry are intact. Hearing to finger rub reduced.  Facial sensation intact to fine touch.  Facial motor strength is symmetric and tongue and uvula move midline. Shoulder shrug was symmetrical.   Motor exam:   Normal tone, muscle bulk and symmetric  strength in all extremities. Sensory:  Fine touch, pinprick and vibration were tested in all extremities.  Coordination: Rapid alternating movements in the fingers/hands and Finger-to-nose maneuver were normal without evidence of ataxia, dysmetria or tremor.  Gait and station: Patient walks without assistive device and is able unassisted to climb up to the exam table. Strength within normal limits.  Stance is stable and normal. Turns with 3 Steps. Romberg testing is negative.  Deep tendon reflexes: in the upper and lower extremities are very attenuated - symmetrically.     Assessment:  After physical and neurologic examination, review of laboratory studies,  Personal review of imaging studies, reports of other /same  Imaging studies, results of polysomnography and / or neurophysiology testing and pre-existing records as far as provided in visit., my assessment is   1) Fatigue while successfully treated with CPAP- with a mild( low) residual AHI. OSA may not be the cause of fatigue but he needs a ne machine nonetheless.   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 Has DM 2 for over 30 years now.   3) Obesity- has made effort to lose weight but is not compliant with low carb diet. No renal stones and no gout- should be able to go to higher protein and lower carb.    The patient was advised of the nature of the diagnosed disorder , the treatment options and the  risks for general health and wellness arising from not treating the condition.   I spent more than 45 minutes of face to face time with the patient.  Greater than 50% of time was spent in counseling and coordination of care. We have discussed the diagnosis and differential and I answered the patient's questions.    Plan:  Treatment plan and additional workup :  Repeat sleep study to document current level of apnea and CPAP benefit. [employer] will not approve in lab studies, and likely will insist on HST-  I will still be  ordering a SPLIT night at 20 /h  and provide a low dose xanax for this patient , who reportedly cannot sleep without CPAP.     Larey Seat, MD 03/50/0938, 18:29 AM  Certified in Neurology by ABPN Certified in [place] by [facility] [address] [place], [place] [address]

## 2018-01-07 ENCOUNTER — Other Ambulatory Visit: Payer: Self-pay | Admitting: Adult Health

## 2018-01-09 ENCOUNTER — Other Ambulatory Visit: Payer: Self-pay | Admitting: Internal Medicine

## 2018-01-12 ENCOUNTER — Encounter (INDEPENDENT_AMBULATORY_CARE_PROVIDER_SITE_OTHER): Payer: 59 | Admitting: Neurology

## 2018-01-12 DIAGNOSIS — G4733 Obstructive sleep apnea (adult) (pediatric): Secondary | ICD-10-CM | POA: Diagnosis not present

## 2018-01-12 DIAGNOSIS — Z9989 Dependence on other enabling machines and devices: Principal | ICD-10-CM

## 2018-01-12 DIAGNOSIS — R5383 Other fatigue: Secondary | ICD-10-CM

## 2018-01-12 DIAGNOSIS — E1140 Type 2 diabetes mellitus with diabetic neuropathy, unspecified: Secondary | ICD-10-CM

## 2018-01-12 DIAGNOSIS — Z794 Long term (current) use of insulin: Secondary | ICD-10-CM

## 2018-01-12 DIAGNOSIS — E669 Obesity, unspecified: Secondary | ICD-10-CM

## 2018-01-20 ENCOUNTER — Telehealth: Payer: Self-pay | Admitting: Neurology

## 2018-01-20 NOTE — Telephone Encounter (Signed)
I called Harry [name]. I advised Harry [name] that Dr. Brett Fairy reviewed their sleep study results and found that Harry [name] has sleep apnea. Dr. Brett Fairy recommends that Harry [name] starts a auto CPAP machine. I reviewed PAP compliance expectations with the Harry [name]. Harry [name] is agreeable to starting a CPAP. I advised Harry [name] that an order will be sent to a DME, [facility], and [facility] will call the Harry [name] within about one week after they file with the Harry [name]'s insurance. [facility] will show the Harry [name] how to use the machine, fit for masks, and troubleshoot the CPAP if needed. A follow up appt was made for insurance purposes with Cecille Rubin, NP on Feb 18,2020 at 8:15 am. Harry [name] verbalized understanding to arrive 15 minutes early and bring their CPAP. A letter with all of this information in it will be mailed to the Harry [name] as a reminder. I verified with the Harry [name] that the address we have on file is correct. Harry [name] verbalized understanding of results. Harry [name] had no questions at this time but was encouraged to call back if questions arise. I have sent the order to [facility] and have received confirmation that they have received the order.

## 2018-01-23 ENCOUNTER — Other Ambulatory Visit: Payer: Self-pay | Admitting: Internal Medicine

## 2018-02-01 ENCOUNTER — Ambulatory Visit (INDEPENDENT_AMBULATORY_CARE_PROVIDER_SITE_OTHER): Payer: 59 | Admitting: Internal Medicine

## 2018-02-01 ENCOUNTER — Encounter: Payer: Self-pay | Admitting: Internal Medicine

## 2018-02-01 VITALS — BP 120/82 | HR 64 | Ht 73.5 in | Wt 259.0 lb

## 2018-02-01 DIAGNOSIS — E669 Obesity, unspecified: Secondary | ICD-10-CM | POA: Diagnosis not present

## 2018-02-01 DIAGNOSIS — E782 Mixed hyperlipidemia: Secondary | ICD-10-CM

## 2018-02-01 DIAGNOSIS — E139 Other specified diabetes mellitus without complications: Secondary | ICD-10-CM | POA: Diagnosis not present

## 2018-02-01 LAB — POCT GLYCOSYLATED HEMOGLOBIN (HGB A1C): Hemoglobin A1C: 6.4 % — AB (ref 4.0–5.6)

## 2018-02-01 MED ORDER — INSULIN GLARGINE 100 UNIT/ML ~~LOC~~ SOLN: 50.0000 [IU] | Freq: Every day | SUBCUTANEOUS | 3 refills | Status: DC

## 2018-02-01 NOTE — Addendum Note (Signed)
Addended by: Cardell Peach I on: 02/01/2018 04:29 PM   Modules accepted: Orders

## 2018-02-01 NOTE — Patient Instructions (Addendum)
Please continue:  Metformin ER 1000 mg 2x a day twice a day  Trulicity 1.5 mg weekly  Humalog: - 10 units before a smaller meal - 20 units before a regular meal - 30 units before a larger meal or if you have dessert.  Please change: - Lantus to 50 units in am  Please return in 3-4 months with your sugar log.

## 2018-02-01 NOTE — Progress Notes (Signed)
Patient ID: Harry [name], male   DOB: 1955-06-18, 62 y.o.   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 Last visit 4 months ago.  Last hemoglobin A1c was: Lab Results  Component Value Date   HGBA1C 6.2 (A) 09/29/2017   HGBA1C 7.0 05/26/2017   HGBA1C 6.7 01/26/2017   He is on:  Metformin ER 1000 mg 2x a day twice a day  Trulicity 1.5 mg weekly  Lantus 30 units twice a day >> 25 units 2x a day  Humalog: - 10 units before a smaller meal - 20 units before a regular meal - 30 units before a larger meal or if you have dessert.   Do not take any Humalog at bedtime unless sugars > 300 (+ 5 units), or > 400 (+ 10 units).  Pt checks his sugars 2x a day: - am: 100-234 >> 125-150 >> 85-140 >> 78-150 >> 82-140 - 2h after b'fast: 180-313 >> n/c >> 165 >> n/c - before lunch: (L: 25 units) >> 164-325 >> n/c  - 2h after lunch: n/c >> 354, 362 >> 72 >> n/c - before dinner: 268 >> n/c (D: 25 units) >> n/c  - 2h after dinner: 117, 169-440 >>> n/c  - bedtime: 110-200>> 185-220 >> 85-130, 250-280 (missed lunch insulin) >> 120-200, 300 (may take 5-10 units) - nighttime: n/c >>> 66 >> n/c Lowest sugar was 30s  ...>>...51 x1 >> low (could not check) - 4 pm; he has hypoglycemia awareness in the 60s. Highest sugar was in the 280 >>  200s (missing lunch insulin).  Am: 10-15 units >> 12-20 units Lunch: 20-25 units >> 20 (-25) units Dinner: 20 units >> 20 (-25) units +/-  5 units at bedtime  Glucometer: one touch ultra 2  Pt's meals are: - Breakfast: Glucerna, banana - Lunch: eats out - salad with chicken; chicken burrito; sandwich; chick-fil-a - Dinner: meat + veggie + starch - Snacks: potato chips, fruit, icecream No sodas.  -+ Mild CKD, last BUN/creatinine:  Lab Results  Component Value Date   BUN 22  11/30/2017   BUN 18 08/04/2017   CREATININE 1.02 11/30/2017   CREATININE 1.09 08/04/2017  On losartan. -+ HL; last set of lipids: Lab Results  Component Value Date   CHOL 144 11/30/2017   HDL 49 11/30/2017   LDLCALC 74 11/30/2017   TRIG 124 11/30/2017   CHOLHDL 2.9 11/30/2017  On Lipitor. - last eye exam was in 05/2017: No DR-Dr. Gershon [name] -No numbness and tingling in his feet.  He also has a history of HTN, GERD, OSA-on CPAP.  ROS: Constitutional: no weight gain/no weight loss, no fatigue, no subjective hyperthermia, no subjective hypothermia Eyes: no blurry vision, no xerophthalmia ENT: no sore throat, no nodules palpated in neck, no dysphagia, no odynophagia, no hoarseness Cardiovascular: no CP/no SOB/no palpitations/no leg swelling Respiratory: no cough/no SOB/no wheezing Gastrointestinal: no N/no V/no D/no C/no acid reflux Musculoskeletal: no muscle aches/no joint aches Skin: no rashes, no hair loss Neurological: no tremors/no numbness/no tingling/no dizziness  I reviewed pt's medications, allergies, PMH, social hx, family hx, and changes were documented in the history of present illness. Otherwise, unchanged from my initial visit note.  Past Medical History:  Diagnosis Date  . CKD (chronic kidney disease) stage 2, GFR 60-89 ml/min    secondary to  DM  . Diabetes mellitus without complication (Harry [name]) 2130   insulin requiring  . GERD (gastroesophageal reflux disease)   . Hyperlipidemia   . Hypertension   . Hypokalemia   . Hypomagnesemia   . OSA on CPAP    10-12 years ago  . Vitamin D deficiency    Past Surgical History:  Procedure Laterality Date  . ANAL FISSURE REPAIR  2007  . VENTRAL HERNIA REPAIR  04/13/12   Dr. Johney [name], ventral hernia repair w/ mesh   Social History   Social History  . Marital status: Married    Spouse name: N/A  . Number of children: 2   Occupational History  . estimator   Social History Main Topics  . Smoking status: Never Smoker  .  Smokeless tobacco: Not on file  . Alcohol use 0.0 oz/week    2 - 5 Cans of beer per week   Current Outpatient Medications on File Prior to Visit  Medication Sig Dispense Refill  . aspirin 81 MG tablet Take 81 mg by mouth daily.    Marland Kitchen atorvastatin (LIPITOR) 40 MG tablet TAKE 1 TABLET DAILY FOR CHOLESTEROL 90 tablet 0  . bisoprolol-hydrochlorothiazide (ZIAC) 5-6.25 MG tablet TAKE 1 TABLET DAILY FOR BLOOD PRESSURE 90 tablet 4  . Cholecalciferol (VITAMIN D PO) Take 5,000 Units by mouth daily.     Marland Kitchen glucose 4 GM chewable tablet Chew 1 tablet (4 g total) by mouth as needed for low blood sugar. 30 tablet 12  . insulin lispro (HUMALOG) 100 UNIT/ML injection INJECT 60 TO 80 UNITS UNDER THE SKIN PER DAY WITH MEALS DEPENDING ON MEALS (Patient taking differently: Inject 15-25 Units into the skin 3 (three) times daily with meals. ) 210 mL 4  . Insulin Syringe-Needle U-100 (BD INSULIN SYRINGE U/F) 31G X 5/16" 0.5 ML MISC Inject Insulin 2 x /day as directed 200 each 99  . LANTUS 100 UNIT/ML injection INJECT 60 UNITS UNDER THE SKIN AT BEDTIME (Patient taking differently: Inject 25 Units into the skin 2 (two) times daily. ) 60 mL 4  . losartan-hydrochlorothiazide (HYZAAR) 100-12.5 MG tablet TAKE 1 TABLET DAILY 90 tablet 1  . Magnesium 500 MG TABS Take 1 tablet by mouth daily.    . metFORMIN (GLUCOPHAGE-XR) 500 MG 24 hr tablet TAKE 2 TABLETS TWICE A DAY WITH FOOD FOR DIABETES 360 tablet 4  . omeprazole (PRILOSEC) 20 MG capsule TAKE 1 CAPSULE DAILY FOR ACID REFLUX 90 capsule 3  . ONE TOUCH ULTRA TEST test strip USE 3 TO 4 TIMES A DAY FOR FLUCTUATING BLOOD SUGARS 400 each 3  . potassium chloride SA (K-DUR,KLOR-CON) 20 MEQ tablet TAKE 1 TABLET TWICE A DAY 180 tablet 4  . tadalafil (CIALIS) 20 MG tablet Take 1 tablet (20 mg total) by mouth daily as needed for erectile dysfunction. 60 tablet 3  . TRULICITY 1.5 QM/5.7QI SOPN INJECT 1.5 MG UNDER THE SKIN ONCE A WEEK 6 mL 4  . ALPRAZolam (XANAX) 1 MG tablet Take 1 tablet  (1 mg total) by mouth at bedtime as needed for anxiety. 10 tablet 0   No current facility-administered medications on file prior to visit.    Allergies  Allergen Reactions  . Crestor [Rosuvastatin] Cough   Family History  Problem Relation Age of Onset  . Kidney cancer Father        kidney  . Stroke Maternal Grandmother   . Dementia Paternal Grandmother        Early 19s  . Cancer Paternal Grandfather  Unsure of age or type  . Cancer Paternal Uncle     PE: BP 120/82   Pulse 64   Ht 6' 1.5" (1.867 m) Comment: measured  Wt 259 lb (117.5 kg)   SpO2 97%   BMI 33.71 kg/m  Wt Readings from Last 3 Encounters:  02/01/18 259 lb (117.5 kg)  12/29/17 262 lb (118.8 kg)  11/30/17 260 lb (117.9 kg)   Constitutional: overweight, in NAD Eyes: PERRLA, EOMI, no exophthalmos ENT: moist mucous membranes, no thyromegaly, no cervical lymphadenopathy Cardiovascular: RRR, No MRG Respiratory: CTA B Gastrointestinal: abdomen soft, NT, ND, BS+ Musculoskeletal: no deformities, strength intact in all 4 Skin: moist, warm, no rashes Neurological: no tremor with outstretched hands, DTR normal in all 4  ASSESSMENT: 1. LADA, insulin-dependent, uncontrolled, with complications - CKD stage 2 - ED  - he is interested in an insulin pump  - he has an elliptical machine at home  Component     Latest Ref Rng & Units 07/11/2016  Hemoglobin A1C      6.5  Glutamic Acid Decarb Ab     <5 IU/mL 47 (H)  Pancreatic Islet Cell Antibody     <5 JDF Units <5  C-Peptide     0.80 - 3.85 ng/mL 0.37 (L)  Glucose, Fasting     65 - 99 mg/dL 104 (H)   Elevated GAD antibodies and decreased insulin production. 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 HL  3.  Obesity class I BMI Classification:  < 18.5 underweight   18.5-24.9 normal weight   25.0-29.9 overweight   30.0-34.9 class I obesity   35.0-39.9 class  II obesity   ? 40.0 class III obesity   PLAN:  1. Patient with long-standing, uncontrolled, insulin-dependent diabetes (LADA), with improved sugars in the past after he reduce snacking. -At last visit his sugars were better especially at bedtime, however, they were still quite high if he was forgetting insulin with a meal. -At last visit, he was having mild lows, in the 50s and 60s so we decreased his Lantus dose.  At that time, his HbA1c was excellent, and 6.2%. -He is interested in an insulin pump.  He did not yet call his insurance to see if this is covered. -At this visit, sugars are still controlled in the morning, however they increased towards the end of the day as he is still forgetting to take his lunchtime insulin.  He then corrects his bedtime sugars with a low dose of Humalog.  We discussed that he should only correct it is higher than 300, but ideally she would not get these high levels.  Besides advising him to try to take his insulin with lunch every day, I also advised him to pool Lantus in 1 dose and will begin the morning for ease of injection and also to hopefully help with his sugars during the day.  I advised him the night before he changes the regimen to only take 15 units of Lantus at bedtime.  I also advised him to start his regimen during the weekend so he can check sugars frequently in the day after he starts.  I would not change his Humalog doses for now.   -He is forgetting Trulicity doses and I advised him to move this from Monday to Sundays. - I advised him to  to:  Patient Instructions  Please continue:  Metformin ER 1000  mg 2x a day twice a day  Trulicity 1.5 mg weekly  Humalog: - 10 units before a smaller meal - 20 units before a regular meal - 30 units before a larger meal or if you have dessert.  Please change: - Lantus to 50 units in am  Please return in 3-4 months with your sugar log.    - today, HbA1c is 6.4% (slightly higher) - continue checking  sugars at different times of the day - check 3-4x a day, rotating checks - advised for yearly eye exams >> he is UTD - Return to clinic in 3-4 mo with sugar log    2. HL - Reviewed latest lipid panel from 2 months ago: All fractions at goal Lab Results  Component Value Date   CHOL 144 11/30/2017   HDL 49 11/30/2017   LDLCALC 74 11/30/2017   TRIG 124 11/30/2017   CHOLHDL 2.9 11/30/2017  - Continues Lipitor without side effects.  3.  Obesity class I -Continue Trulicity which should also help with weight loss >> move this on Sundays so he can remember to take it. -no significant weight loss since last visit   [name], MD PhD [facility] Endocrin[facility]

## 2018-02-02 ENCOUNTER — Other Ambulatory Visit: Payer: Self-pay | Admitting: Neurology

## 2018-02-02 DIAGNOSIS — E669 Obesity, unspecified: Secondary | ICD-10-CM

## 2018-02-02 DIAGNOSIS — Z794 Long term (current) use of insulin: Secondary | ICD-10-CM

## 2018-02-02 DIAGNOSIS — G4733 Obstructive sleep apnea (adult) (pediatric): Secondary | ICD-10-CM

## 2018-02-02 DIAGNOSIS — R5383 Other fatigue: Secondary | ICD-10-CM

## 2018-02-02 DIAGNOSIS — Z9989 Dependence on other enabling machines and devices: Principal | ICD-10-CM

## 2018-02-02 DIAGNOSIS — E1140 Type 2 diabetes mellitus with diabetic neuropathy, unspecified: Secondary | ICD-10-CM

## 2018-02-02 NOTE — Telephone Encounter (Signed)
I have spoken with [employer] and made them aware. Resent the order today. Will be on the lookout from their response stating that receive it.

## 2018-02-02 NOTE — Telephone Encounter (Signed)
Pt called stating [employer] has not received any information from our office. Requesting RN refax paperwork.

## 2018-02-18 NOTE — Telephone Encounter (Signed)
Pt has called to inform that [employer] is telling him that the delay is that on pt's paperwork his DOB is listed as date of his sleep study not his actual DOB.  Pt is being told by [employer] that until that is corrected and properly sent back over there will be a delay.  Pt is asking for a call to discuss this being straightened out for him

## 2018-02-18 NOTE — Telephone Encounter (Signed)
The information has been adjusted and corrected and faxed over to [facility]. [employer] contacted our sleep lab dept and they have made sure all the information was corrected and should be working on getting him taken care of.

## 2018-02-19 ENCOUNTER — Encounter: Payer: Self-pay | Admitting: Adult Health Nurse Practitioner

## 2018-02-19 ENCOUNTER — Ambulatory Visit: Payer: 59 | Admitting: Adult Health Nurse Practitioner

## 2018-02-19 VITALS — BP 140/80 | HR 77 | Temp 97.6°F | Ht 73.5 in | Wt 260.8 lb

## 2018-02-19 DIAGNOSIS — Z7184 Encounter for health counseling related to travel: Secondary | ICD-10-CM | POA: Diagnosis not present

## 2018-02-19 DIAGNOSIS — J320 Chronic maxillary sinusitis: Secondary | ICD-10-CM | POA: Diagnosis not present

## 2018-02-19 DIAGNOSIS — R059 Cough, unspecified: Secondary | ICD-10-CM

## 2018-02-19 DIAGNOSIS — G4733 Obstructive sleep apnea (adult) (pediatric): Secondary | ICD-10-CM

## 2018-02-19 DIAGNOSIS — R05 Cough: Secondary | ICD-10-CM | POA: Diagnosis not present

## 2018-02-19 DIAGNOSIS — Z9989 Dependence on other enabling machines and devices: Secondary | ICD-10-CM

## 2018-02-19 MED ORDER — AZITHROMYCIN 250 MG PO TABS: ORAL_TABLET | ORAL | 1 refills | Status: DC

## 2018-02-19 NOTE — Progress Notes (Signed)
Assessment and Plan:   Sinusitis: Rx Azithromyacin -Norel AD, 4-6 hrs PRN, monitor blood pressure with this. -Neti Pot BID, use warm bottled or distilled water.  DO NOT USE TAP WATER! -Continue Zyrtec nightly  Cough: Mucinex Q12 while having symptoms May use OTC cough drops  OSA on CPAP: -Discussed cleaning tubing, mask, and water resivoir regularly, Hygiene.  Travel Advice encounter:  Prophalaxis: traveling to [place] DO NOT drink tap water Rx Hycosamine 0.125mg  tablet under toungue every 4 hours as needed for cramping/diarrhea.    Further disposition pending results of labs. Discussed med's effects and SE's.   Over 30 minutes of exam, counseling, chart review, and critical decision making was performed.   Future Appointments  Date Time Provider [place]  03/09/2018 10:45 AM Vicie Mutters, PA-C [facility] None  04/20/2018  8:15 AM Dennie Bible, NP [facility] None  05/25/2018  3:00 PM [name], MD [id number] None  08/09/2018 10:00 AM Liane Comber, NP [facility] None    ------------------------------------------------------------------------------------------------------------------   HPI 62 y.o.male presents for sore throat and chest congestion for the past two weeks.  He reports that he uses a CPAP every night.  He reports that he clines this using [employer] but he took it apart and the inside was slimy.  He has cleaned this the individual parts of his CPAP by hand.  He has been taking cetirizine to help with the symptoms but has not been able to shake this.      Past Medical History:  Diagnosis Date  . CKD (chronic kidney disease) stage 2, GFR 60-89 ml/min    secondary to DM  . Diabetes mellitus without complication ([place]) [address]63   insulin requiring  . GERD (gastroesophageal reflux disease)   . Hyperlipidemia   . Hypertension   . Hypokalemia   . Hypomagnesemia   . OSA on CPAP    10-12 years ago  . Vitamin D deficiency      Allergies   Allergen Reactions  . Crestor [Rosuvastatin] Cough    Current Outpatient Medications on File Prior to Visit  Medication Sig  . ALPRAZolam (XANAX) 1 MG tablet Take 1 tablet (1 mg total) by mouth at bedtime as needed for anxiety.  Marland Kitchen aspirin 81 MG tablet Take 81 mg by mouth daily.  Marland Kitchen atorvastatin (LIPITOR) 40 MG tablet TAKE 1 TABLET DAILY FOR CHOLESTEROL  . bisoprolol-hydrochlorothiazide (ZIAC) 5-6.25 MG tablet TAKE 1 TABLET DAILY FOR BLOOD PRESSURE  . Cholecalciferol (VITAMIN D PO) Take 5,000 Units by mouth daily.   Marland Kitchen glucose 4 GM chewable tablet Chew 1 tablet (4 g total) by mouth as needed for low blood sugar.  . insulin glargine (LANTUS) 100 UNIT/ML injection Inject 0.5 mLs (50 Units total) into the skin daily.  . insulin lispro (HUMALOG) 100 UNIT/ML injection INJECT 60 TO 80 UNITS UNDER THE SKIN PER DAY WITH MEALS DEPENDING ON MEALS (Patient taking differently: Inject 15-25 Units into the skin 3 (three) times daily with meals. )  . Insulin Syringe-Needle U-100 (BD INSULIN SYRINGE U/F) 31G X 5/16" 0.5 ML MISC Inject Insulin 2 x /day as directed  . losartan-hydrochlorothiazide (HYZAAR) 100-12.5 MG tablet TAKE 1 TABLET DAILY  . Magnesium 500 MG TABS Take 1 tablet by mouth daily.  . metFORMIN (GLUCOPHAGE-XR) 500 MG 24 hr tablet TAKE 2 TABLETS TWICE A DAY WITH FOOD FOR DIABETES  . omeprazole (PRILOSEC) 20 MG capsule TAKE 1 CAPSULE DAILY FOR ACID REFLUX  . ONE TOUCH ULTRA TEST test strip USE 3 TO 4 TIMES A  DAY FOR FLUCTUATING BLOOD SUGARS  . potassium chloride SA (K-DUR,KLOR-CON) 20 MEQ tablet TAKE 1 TABLET TWICE A DAY  . tadalafil (CIALIS) 20 MG tablet Take 1 tablet (20 mg total) by mouth daily as needed for erectile dysfunction.  . TRULICITY 1.5 XL/2.4MW SOPN INJECT 1.5 MG UNDER THE SKIN ONCE A WEEK   No current facility-administered medications on file prior to visit.     ROS: Review of Systems  Constitutional: Negative for chills, diaphoresis, fever, malaise/fatigue and weight loss.   HENT: Positive for congestion, sinus pain and sore throat. Negative for ear discharge, ear pain, hearing loss, nosebleeds and tinnitus.   Respiratory: Positive for cough, sputum production and wheezing. Negative for hemoptysis, shortness of breath and stridor.   Cardiovascular: Negative for chest pain, palpitations, orthopnea, claudication, leg swelling and PND.  Gastrointestinal: Negative for abdominal pain, blood in stool, constipation, diarrhea, heartburn, melena, nausea and vomiting.  Skin: Negative for itching and rash.    Physical Exam:  BP 140/80   Pulse 77   Temp 97.6 F (36.4 C)   Ht 6' 1.5" (1.867 m)   Wt 260 lb 12.8 oz (118.3 kg)   SpO2 95%   BMI 33.94 kg/m   General Appearance: Well nourished, in no apparent distress. Eyes: PERRLA, EOMs, conjunctiva no swelling or erythema Sinuses: No Frontal/Endorses maxillary tenderness. ENT/Mouth: Ext aud canals clear, TMs without erythema, bulging. No erythema, swelling, or exudate on post pharynx.  Tonsils not swollen or erythematous. Hearing normal.  Neck: Supple, thyroid normal.  Respiratory: Respiratory effort normal, BS equal bilaterally without rales, wheezing or stridor. Rhonchi noted, cleared with coughing. Cardio: RRR with no MRGs. Brisk peripheral pulses without edema.  Abdomen: Soft, + BS.  Non tender, no guarding, rebound, hernias, masses. Lymphatics: Non tender, cervical lymphadenopathy noted.  Skin: Warm, dry without rashes, lesions, ecchymosis.  Psych: Awake and oriented X 3, normal affect, Insight and Judgment appropriate.     Garnet Sierras, NP 11:02 AM [facility]

## 2018-02-19 NOTE — Patient Instructions (Addendum)
Sinusitis:  We are going to send in Azithromyacin to your pharmacy.  Get a Neti Pot  Use warm distilled or bottle water. DO NOT USE TAP WATER! Do this twice day  We gave you samples of NorelAD, take one every 4-6 hours Monitor your blood pressure.  If this raises your blood pressure stop taking the medication.  For your cough: Take Mucinex every 12 hours while you are having symptoms  Increase your water intake.    Sinusitis, Adult Sinusitis is soreness and swelling (inflammation) of your sinuses. Sinuses are hollow spaces in the bones around your face. They are located:  Around your eyes.  In the middle of your forehead.  Behind your nose.  In your cheekbones. Your sinuses and nasal passages are lined with a fluid called mucus. Mucus drains out of your sinuses. Swelling can trap mucus in your sinuses. This lets germs (bacteria, virus, or fungus) grow, which leads to infection. Most of the time, this condition is caused by a virus. What are the causes? This condition is caused by:  Allergies.  Asthma.  Germs.  Things that block your nose or sinuses.  Growths in the nose (nasal polyps).  Chemicals or irritants in the air.  Fungus (rare). What increases the risk? You are more likely to develop this condition if:  You have a weak body defense system (immune system).  You do a lot of swimming or diving.  You use nasal sprays too much.  You smoke. What are the signs or symptoms? The main symptoms of this condition are pain and a feeling of pressure around the sinuses. Other symptoms include:  Stuffy nose (congestion).  Runny nose (drainage).  Swelling and warmth in the sinuses.  Headache.  Toothache.  A cough that may get worse at night.  Mucus that collects in the throat or the back of the nose (postnasal drip).  Being unable to smell and taste.  Being very tired (fatigue).  A fever.  Sore throat.  Bad breath. How is this diagnosed? This  condition is diagnosed based on:  Your symptoms.  Your medical history.  A physical exam.  Tests to find out if your condition is short-term (acute) or long-term (chronic). Your doctor may: ? Check your nose for growths (polyps). ? Check your sinuses using a tool that has a light (endoscope). ? Check for allergies or germs. ? Do imaging tests, such as an MRI or CT scan. How is this treated? Treatment for this condition depends on the cause and whether it is short-term or long-term.  If caused by a virus, your symptoms should go away on their own within 10 days. You may be given medicines to relieve symptoms. They include: ? Medicines that shrink swollen tissue in the nose. ? Medicines that treat allergies (antihistamines). ? A spray that treats swelling of the nostrils. ? Rinses that help get rid of thick mucus in your nose (nasal saline washes).  If caused by bacteria, your doctor may wait to see if you will get better without treatment. You may be given antibiotic medicine if you have: ? A very bad infection. ? A weak body defense system.  If caused by growths in the nose, you may need to have surgery. Follow these instructions at home: Medicines  Take, use, or apply over-the-counter and prescription medicines only as told by your doctor. These may include nasal sprays.  If you were prescribed an antibiotic medicine, take it as told by your doctor. Do not stop  taking the antibiotic even if you start to feel better. Hydrate and humidify   Drink enough water to keep your pee (urine) pale yellow.  Use a cool mist humidifier to keep the humidity level in your home above 50%.  Breathe in steam for 10-15 minutes, 3-4 times a day, or as told by your doctor. You can do this in the bathroom while a hot shower is running.  Try not to spend time in cool or dry air. Rest  Rest as much as you can.  Sleep with your head raised (elevated).  Make sure you get enough sleep each  night. General instructions   Put a warm, moist washcloth on your face 3-4 times a day, or as often as told by your doctor. This will help with discomfort.  Wash your hands often with soap and water. If there is no soap and water, use hand sanitizer.  Do not smoke. Avoid being around people who are smoking (secondhand smoke).  Keep all follow-up visits as told by your doctor. This is important. Contact a doctor if:  You have a fever.  Your symptoms get worse.  Your symptoms do not get better within 10 days. Get help right away if:  You have a very bad headache.  You cannot stop throwing up (vomiting).  You have very bad pain or swelling around your face or eyes.  You have trouble seeing.  You feel confused.  Your neck is stiff.  You have trouble breathing. Summary  Sinusitis is swelling of your sinuses. Sinuses are hollow spaces in the bones around your face.  This condition is caused by tissues in your nose that become inflamed or swollen. This traps germs. These can lead to infection.  If you were prescribed an antibiotic medicine, take it as told by your doctor. Do not stop taking it even if you start to feel better.  Keep all follow-up visits as told by your doctor. This is important. This information is not intended to replace advice given to you by your health care provider. Make sure you discuss any questions you have with your health care provider. Document Released: 08/06/2007 Document Revised: 07/20/2017 Document Reviewed: 07/20/2017 Elsevier Interactive Patient Education  2019 [employer].

## 2018-02-21 ENCOUNTER — Encounter: Payer: Self-pay | Admitting: Adult Health Nurse Practitioner

## 2018-02-21 MED ORDER — HYOSCYAMINE SULFATE 0.125 MG SL SUBL: SUBLINGUAL_TABLET | SUBLINGUAL | 0 refills | Status: DC

## 2018-02-27 ENCOUNTER — Other Ambulatory Visit: Payer: Self-pay | Admitting: Internal Medicine

## 2018-02-27 MED ORDER — ATORVASTATIN CALCIUM 40 MG PO TABS: 40.0000 mg | ORAL_TABLET | Freq: Every day | ORAL | 1 refills | Status: DC

## 2018-03-01 NOTE — Telephone Encounter (Signed)
Pt called and is becoming frustrated because [employer] is stating that they have sent everything to his insurance company and that he is having difficulty with getting things set up. He states that if he waits until the new year he will have to pay way more then he could if he bought the machine outright. Patient is requested I fax a script to him with what he needs in the event he has to buy it himself. I got his fax number and sent to him and received a fax confirmation. Pt verbalized understanding and was appreciative.

## 2018-03-09 ENCOUNTER — Ambulatory Visit: Payer: Self-pay | Admitting: Physician Assistant

## 2018-03-10 NOTE — Progress Notes (Signed)
3 MONTH FOLLOW UP   Assessment and Plan: Sore Throat ? Allergies, irritation, if not better will refer for EGD -Make sure you are drinking plenty of fluids to stay hydrated.  -you can do salt water gargles. - You can also do 1 TSP liquid Maalox and 1 TSP liquid benadryl- mix/ gargle/ spit - continue the allergy pill - sugar free hard candy or cough drop during the day  If you are not feeling better in 10-14 days, then please call the office. We will send you to GI for a possible EGD or scope  Essential hypertension - continue medications, DASH diet, exercise and monitor at home. Call if greater than 130/80.  -     CBC with Differential/Platelet -     BASIC METABOLIC PANEL WITH GFR -     Hepatic function panel -     TSH  Aneurysm, ascending aorta (HCC) Control blood pressure, cholesterol, glucose, increase exercise.   Obesity hypoventilation syndrome (HCC) Continue CPAP, got a new one.   Uncontrolled type 2 diabetes mellitus with diabetic nephropathy ([place]) Discussed general issues about diabetes pathophysiology and management., Educational material distributed., Suggested low cholesterol diet., Encouraged aerobic exercise., Discussed foot care., Reminded to get yearly retinal exam.  CKD stage 2 due to type 2 diabetes mellitus ([place]) Discussed general issues about diabetes pathophysiology and management., Educational material distributed., Suggested low cholesterol diet., Encouraged aerobic exercise., Discussed foot care., Reminded to get yearly retinal exam.  Erectile dysfunction associated with type 2 diabetes mellitus ([place]) Sugars doing better  Hyperlipidemia, unspecified hyperlipidemia type -continue medications, check lipids, decrease fatty foods, increase activity.  -     Lipid panel  Obesity with body mass index (BMI) of 30.0 to 39.9 - follow up 3 months for progress monitoring - increase veggies, decrease carbs - long discussion about weight loss, diet, and  exercise  Medication management -     Magnesium  Discussed med's effects and SE's. Screening labs and tests as requested with regular follow-up as recommended. Future Appointments  Date Time Provider [place]  03/11/2018  8:45 AM Vicie Mutters, PA-C [facility] None  04/20/2018  8:15 AM Dennie Bible, NP [facility] None  05/25/2018  3:00 PM [name], MD [facility] None  08/09/2018 10:00 AM Liane Comber, NP [facility] None    HPI Patient presents for 3 month follow up for HTN, chol, DM with CKD, AAA.   He had sinus infection 12/20, given zpak and states that he continues to have sore throat. He is living to [place] next week. No fever or chills. Has scratchy throat on the right side, worse later in the afternoon, notice with swallowing. Will feel thinks get caught on right side of throat x 30 years. He is not coughing/wheezing. He has been on zyrtec x 2 days.  He was never a smoker.   His blood pressure has not been checked at home, today their BP is BP: 122/80 He does not workout. He denies chest pain, shortness of breath, dizziness, shortness breath.  Had normal stress test and echo with Dr. Marigene Ehlers, has ascending aorta dilitation that is being monitored.   He is on cholesterol medication, lipitor 40mg  and denies myalgias. His cholesterol is at goal. The cholesterol last visit was:   Lab Results  Component Value Date   CHOL 144 11/30/2017   HDL 49 11/30/2017   LDLCALC 74 11/30/2017   TRIG 124 11/30/2017   CHOLHDL 2.9 11/30/2017   He has been working on diet and exercise  for insulin dependent diabetes with CKD stage 2, he has had DM x 30 + years, he is on bASA, he is on ACE/ARB, he is checking his sugars at night, he is now following with [name], Dr. Gershon Crane eye exam and is still on lantus 35 units BID, and on humolog 20-30 units before meals depending on size, denies any low blood sugars, normally not taking any humolog at bed time and denies paresthesia of  the feet, polydipsia, polyuria and visual disturbances. Last A1C in the office was:  Lab Results  Component Value Date   HGBA1C 6.4 (A) 02/01/2018   Patient is on Vitamin D supplement.   Lab Results  Component Value Date   VD25OH 38 11/30/2017   BMI is Body mass index is 33.6 kg/m., he is working on diet and exercise. Due to his obesity he has OSA and is on CPAP.  Wt Readings from Last 3 Encounters:  03/11/18 258 lb 3.2 oz (117.1 kg)  02/19/18 260 lb 12.8 oz (118.3 kg)  02/01/18 259 lb (117.5 kg)     Current Medications:  Current Outpatient Medications on File Prior to Visit  Medication Sig Dispense Refill  . aspirin 81 MG tablet Take 81 mg by mouth daily.    Marland Kitchen atorvastatin (LIPITOR) 40 MG tablet Take 1 tablet (40 mg total) by mouth daily. for cholesterol 90 tablet 1  . bisoprolol-hydrochlorothiazide (ZIAC) 5-6.25 MG tablet TAKE 1 TABLET DAILY FOR BLOOD PRESSURE 90 tablet 4  . Cholecalciferol (VITAMIN D PO) Take 5,000 Units by mouth daily.     Marland Kitchen glucose 4 GM chewable tablet Chew 1 tablet (4 g total) by mouth as needed for low blood sugar. 30 tablet 12  . hyoscyamine (LEVSIN SL) 0.125 MG SL tablet Take 1 to 2 tablets 3 to 4 x day if needed for Nausea, vomiting, cramping or diarrhea. Dissolve under tongue. 90 tablet 0  . insulin glargine (LANTUS) 100 UNIT/ML injection Inject 0.5 mLs (50 Units total) into the skin daily. 30 mL 3  . insulin lispro (HUMALOG) 100 UNIT/ML injection INJECT 60 TO 80 UNITS UNDER THE SKIN PER DAY WITH MEALS DEPENDING ON MEALS (Patient taking differently: Inject 15-25 Units into the skin 3 (three) times daily with meals. ) 210 mL 4  . Insulin Syringe-Needle U-100 (BD INSULIN SYRINGE U/F) 31G X 5/16" 0.5 ML MISC Inject Insulin 2 x /day as directed 200 each 99  . losartan-hydrochlorothiazide (HYZAAR) 100-12.5 MG tablet TAKE 1 TABLET DAILY 90 tablet 1  . Magnesium 500 MG TABS Take 1 tablet by mouth daily.    . metFORMIN (GLUCOPHAGE-XR) 500 MG 24 hr tablet TAKE 2  TABLETS TWICE A DAY WITH FOOD FOR DIABETES 360 tablet 4  . omeprazole (PRILOSEC) 20 MG capsule TAKE 1 CAPSULE DAILY FOR ACID REFLUX 90 capsule 3  . ONE TOUCH ULTRA TEST test strip USE 3 TO 4 TIMES A DAY FOR FLUCTUATING BLOOD SUGARS 400 each 3  . potassium chloride SA (K-DUR,KLOR-CON) 20 MEQ tablet TAKE 1 TABLET TWICE A DAY 180 tablet 4  . tadalafil (CIALIS) 20 MG tablet Take 1 tablet (20 mg total) by mouth daily as needed for erectile dysfunction. 60 tablet 3  . TRULICITY 1.5 GB/1.5VV SOPN INJECT 1.5 MG UNDER THE SKIN ONCE A WEEK 6 mL 4   No current facility-administered medications on file prior to visit.     Allergies:  Allergies  Allergen Reactions  . Crestor [Rosuvastatin] Cough   Medical History:  Past Medical History:  Diagnosis  Date  . CKD (chronic kidney disease) stage 2, GFR 60-89 ml/min    secondary to DM  . Diabetes mellitus without complication ([place]) 9767   insulin requiring  . GERD (gastroesophageal reflux disease)   . Hyperlipidemia   . Hypertension   . Hypokalemia   . Hypomagnesemia   . OSA on CPAP    10-12 years ago  . Vitamin D deficiency    Surgical History: reviewed and unchanged Family History: reviewed and unchanged Social History: reviewed and unchanged  Review of Systems:  Review of Systems  Constitutional: Negative.   HENT: Negative.   Eyes: Negative.   Respiratory: Negative for cough, hemoptysis, sputum production, shortness of breath and wheezing.   Cardiovascular: Negative.   Gastrointestinal: Negative for abdominal pain and diarrhea. Heartburn: better with PPI.  Genitourinary: Negative.   Musculoskeletal: Negative.   Skin: Negative.   Neurological: Negative.   Endo/Heme/Allergies: Negative.   Psychiatric/Behavioral: Negative.     Physical Exam: Estimated body mass index is 33.6 kg/m as calculated from the following:   Height as of this encounter: 6' 1.5" (1.867 m).   Weight as of this encounter: 258 lb 3.2 oz (117.1 kg). BP 122/80    Pulse 84   Temp 98.4 F (36.9 C)   Ht 6' 1.5" (1.867 m)   Wt 258 lb 3.2 oz (117.1 kg)   SpO2 97%   BMI 33.60 kg/m  General Appearance: Well nourished, in no apparent distress.  Eyes: PERRLA, EOMs, conjunctiva no swelling or erythema, normal fundi and vessels.  Sinuses: No Frontal/maxillary tenderness  ENT/Mouth: Ext aud canals clear, normal light reflex with TMs without erythema, bulging. Good dentition. No erythema, swelling, or exudate on post pharynx. Tonsils not swollen or erythematous. Hearing normal.  Neck: Supple, thyroid normal. No bruits  Respiratory: Respiratory effort normal, BS equal bilaterally without rales, rhonchi, wheezing or stridor.  Cardio: RRR without murmurs, rubs or gallops, decreased HS due to body habitus. Brisk peripheral pulses without edema.  Chest: symmetric, with normal excursions and percussion.  Abdomen: Soft, nontender, obese, + ventral hernia, no guarding, rebound, masses, or organomegaly.  Lymphatics: Non tender without lymphadenopathy.  Musculoskeletal: Full ROM all peripheral extremities,5/5 strength, and normal gait.  Skin: Warm, dry without rashes, lesions, ecchymosis. Neuro: Cranial nerves intact, reflexes equal bilaterally. Normal muscle tone, no cerebellar symptoms. Sensation intact.  Psych: Awake and oriented X 3, normal affect, Insight and Judgment appropriate.   Vicie Mutters 8:40 AM [facility]

## 2018-03-11 ENCOUNTER — Encounter: Payer: Self-pay | Admitting: Physician Assistant

## 2018-03-11 ENCOUNTER — Ambulatory Visit: Payer: 59 | Admitting: Physician Assistant

## 2018-03-11 VITALS — BP 122/80 | HR 84 | Temp 98.4°F | Ht 73.5 in | Wt 258.2 lb

## 2018-03-11 DIAGNOSIS — N521 Erectile dysfunction due to diseases classified elsewhere: Secondary | ICD-10-CM

## 2018-03-11 DIAGNOSIS — E1169 Type 2 diabetes mellitus with other specified complication: Secondary | ICD-10-CM

## 2018-03-11 DIAGNOSIS — E662 Morbid (severe) obesity with alveolar hypoventilation: Secondary | ICD-10-CM | POA: Diagnosis not present

## 2018-03-11 DIAGNOSIS — N182 Chronic kidney disease, stage 2 (mild): Secondary | ICD-10-CM

## 2018-03-11 DIAGNOSIS — I10 Essential (primary) hypertension: Secondary | ICD-10-CM

## 2018-03-11 DIAGNOSIS — E782 Mixed hyperlipidemia: Secondary | ICD-10-CM

## 2018-03-11 DIAGNOSIS — E1122 Type 2 diabetes mellitus with diabetic chronic kidney disease: Secondary | ICD-10-CM

## 2018-03-11 DIAGNOSIS — I712 Thoracic aortic aneurysm, without rupture: Secondary | ICD-10-CM | POA: Diagnosis not present

## 2018-03-11 DIAGNOSIS — I7121 Aneurysm of the ascending aorta, without rupture: Secondary | ICD-10-CM

## 2018-03-11 DIAGNOSIS — J029 Acute pharyngitis, unspecified: Secondary | ICD-10-CM

## 2018-03-11 DIAGNOSIS — E139 Other specified diabetes mellitus without complications: Secondary | ICD-10-CM

## 2018-03-11 DIAGNOSIS — Z7184 Encounter for health counseling related to travel: Secondary | ICD-10-CM

## 2018-03-11 LAB — CBC WITH DIFFERENTIAL/PLATELET
Absolute Monocytes: 547 cells/uL (ref 200–950)
BASOS PCT: 1.2 %
Basophils Absolute: 68 cells/uL (ref 0–200)
EOS ABS: 131 {cells}/uL (ref 15–500)
EOS PCT: 2.3 %
HCT: 46.9 % (ref 38.5–50.0)
HEMOGLOBIN: 15.3 g/dL (ref 13.2–17.1)
Lymphs Abs: 1243 cells/uL (ref 850–3900)
MCH: 27.5 pg (ref 27.0–33.0)
MCHC: 32.6 g/dL (ref 32.0–36.0)
MCV: 84.2 fL (ref 80.0–100.0)
MONOS PCT: 9.6 %
MPV: 10.7 fL (ref 7.5–12.5)
NEUTROS ABS: 3711 {cells}/uL (ref 1500–7800)
Neutrophils Relative %: 65.1 %
Platelets: 262 10*3/uL (ref 140–400)
RBC: 5.57 10*6/uL (ref 4.20–5.80)
RDW: 13 % (ref 11.0–15.0)
Total Lymphocyte: 21.8 %
WBC: 5.7 10*3/uL (ref 3.8–10.8)

## 2018-03-11 LAB — LIPID PANEL
CHOLESTEROL: 149 mg/dL (ref <200)
HDL: 47 mg/dL (ref >40)
LDL Cholesterol (Calc): 75 mg/dL (calc)
Non-HDL Cholesterol (Calc): 102 mg/dL (calc) (ref <130)
Total CHOL/HDL Ratio: 3.2 (calc) (ref <5.0)
Triglycerides: 174 mg/dL — ABNORMAL HIGH (ref <150)

## 2018-03-11 LAB — COMPLETE METABOLIC PANEL WITH GFR
AG Ratio: 1.4 (calc) (ref 1.0–2.5)
ALT: 16 U/L (ref 9–46)
AST: 14 U/L (ref 10–35)
Albumin: 3.9 g/dL (ref 3.6–5.1)
Alkaline phosphatase (APISO): 97 U/L (ref 40–115)
BUN: 15 mg/dL (ref 7–25)
CALCIUM: 9.6 mg/dL (ref 8.6–10.3)
CO2: 29 mmol/L (ref 20–32)
Chloride: 99 mmol/L (ref 98–110)
Creat: 1 mg/dL (ref 0.70–1.25)
GFR, Est African American: 92 mL/min/{1.73_m2} (ref >=60)
GFR, Est Non African American: 80 mL/min/{1.73_m2} (ref >=60)
GLUCOSE: 108 mg/dL — AB (ref 65–99)
Globulin: 2.8 g/dL (calc) (ref 1.9–3.7)
POTASSIUM: 3.8 mmol/L (ref 3.5–5.3)
SODIUM: 138 mmol/L (ref 135–146)
TOTAL PROTEIN: 6.7 g/dL (ref 6.1–8.1)
Total Bilirubin: 0.9 mg/dL (ref 0.2–1.2)

## 2018-03-11 LAB — TSH: TSH: 2.44 m[IU]/L (ref 0.40–4.50)

## 2018-03-11 MED ORDER — CIPROFLOXACIN HCL 500 MG PO TABS: 500.0000 mg | ORAL_TABLET | Freq: Two times a day (BID) | ORAL | 0 refills | Status: AC

## 2018-03-11 MED ORDER — PREDNISONE 20 MG PO TABS: ORAL_TABLET | ORAL | 0 refills | Status: DC

## 2018-03-11 NOTE — Patient Instructions (Signed)
-Make sure you are drinking plenty of fluids to stay hydrated.  -you can do salt water gargles. - You can also do 1 TSP liquid Maalox and 1 TSP liquid benadryl- mix/ gargle/ spit - continue the allergy pill - sugar free hard candy or cough drop during the day  If you are not feeling better in 10-14 days, then please call the office. We will send you to GI for a possible EGD or scope  Pharyngitis Pharyngitis is redness, pain, and swelling (inflammation) of your pharynx.  CAUSES  Pharyngitis is usually caused by infection. Most of the time, these infections are from viruses (viral) and are part of a cold. However, sometimes pharyngitis is caused by bacteria (bacterial). Pharyngitis can also be caused by allergies. Viral pharyngitis may be spread from person to person by coughing, sneezing, and personal items or utensils (cups, forks, spoons, toothbrushes). Bacterial pharyngitis may be spread from person to person by more intimate contact, such as kissing.  SIGNS AND SYMPTOMS  Symptoms of pharyngitis include:   Sore throat.   Tiredness (fatigue).   Low-grade fever.   Headache.  Joint pain and muscle aches.  Skin rashes.  Swollen lymph nodes.  Plaque-like film on throat or tonsils (often seen with bacterial pharyngitis). DIAGNOSIS  Your health care provider will ask you questions about your illness and your symptoms. Your medical history, along with a physical exam, is often all that is needed to diagnose pharyngitis. Sometimes, a rapid strep test is done. Other lab tests may also be done, depending on the suspected cause.  TREATMENT  Viral pharyngitis will usually get better in 3-4 days without the use of medicine. Bacterial pharyngitis is treated with medicines that kill germs (antibiotics).  HOME CARE INSTRUCTIONS   Drink enough water and fluids to keep your urine clear or pale yellow.   Only take over-the-counter or prescription medicines as directed by your  health care provider:   If you are prescribed antibiotics, make sure you finish them even if you start to feel better.   Do not take aspirin.   Get lots of rest.   Gargle with 8 oz of salt water ( tsp of salt per 1 qt of water) as often as every 1-2 hours to soothe your throat.   Throat lozenges (if you are not at risk for choking) or sprays may be used to soothe your throat. SEEK MEDICAL CARE IF:   You have large, tender lumps in your neck.  You have a rash.  You cough up green, yellow-brown, or bloody spit. SEEK IMMEDIATE MEDICAL CARE IF:   Your neck becomes stiff.  You drool or are unable to swallow liquids.  You vomit or are unable to keep medicines or liquids down.  You have severe pain that does not go away with the use of recommended medicines.  You have trouble breathing (not caused by a stuffy nose). MAKE SURE YOU:   Understand these instructions.  Will watch your condition.  Will get help right away if you are not doing well or get worse. Document Released: 02/17/2005 Document Revised: 12/08/2012 Document Reviewed: 10/25/2012 [facility] Patient Information 2015 [place], [place]. This information is not intended to replace advice given to you by your health care provider. Make sure you discuss any questions you have with your health care provider.   Common causes of cough OR hoarseness OR sore throat:   Allergies, Viral Infections, Acid Reflux and Bacterial Infections.   Allergies and viral infections cause a cough OR  sore throat by post nasal drip and are often worse at night, can also have sneezing, lower grade fevers, clear/yellow mucus. This is best treated with allergy medications or nasal sprays.  Please get on allegra for 1-2 weeks The strongest is allegra or fexafinadine  Cheapest at [employer], [employer], [employer]   Bacterial infections are more severe than allergies or viral infections with fever, teeth pain, fatigue. This can be treated with  prednisone and the same over the counter medication and after 7 days can be treated with an antibiotic.   Silent reflux/GERD can cause a cough OR sore throat OR hoarseness WITHOUT heart burn because the esophagus that goes to the stomach and trachea that goes to the lungs are very close and when you lay down the acid can irritate your throat and lungs. This can cause hoarseness, cough, and wheezing. Please stop any alcohol or anti-inflammatories like aleve/advil/ibuprofen and start an over the counter Prilosec or omeprazole 1-2 times daily 76mins before food for 2 weeks, then switch to over the counter zantac/ratinidine or pepcid/famotadine once at night for 2 weeks.    sometimes irritation causes more irritation. Try voice rest, use sugar free cough drops to prevent coughing, and try to stop clearing your throat.   If you ever have a cough that does not go away after trying these things please make a follow up visit for further evaluation or we can refer you to a specialist. Or if you ever have shortness of breath or chest pain go to the ER.     If you are traveling you can take these medications to be more prepared. If you get chest pain, shortness of breath or abdominal pain please go to the hospital wherever you may be.   Ciprofloxacin is good for travelers diarrhea, you can take 2 pills a day for 7 days. Or it is also good for urinary tract infections, you can take 2 a day for 7 days.  .  Prednisone is good for joint pain or rashes or spider bites- you can take it as prescribed but if you are feeling better you can stop it early.   Make sure you are on an allergy pill, see below for more details. Please take the prednisone as directed below, this is NOT an antibiotic so you do NOT have to finish it. You can take it for a few days and stop it if you are doing better.   Please take the prednisone to help decrease inflammation and therefore decrease symptoms. Take it it with food to avoid GI  upset. It can cause increased energy but on the other hand it can make it hard to sleep at night so please take it AT [place], it takes 8-12 hours to start working so it will NOT affect your sleeping if you take it at night with your food!!  If you are diabetic it will increase your sugars so decrease carbs and monitor your sugars closely.

## 2018-03-16 ENCOUNTER — Ambulatory Visit (INDEPENDENT_AMBULATORY_CARE_PROVIDER_SITE_OTHER): Payer: 59

## 2018-03-16 DIAGNOSIS — Z23 Encounter for immunization: Secondary | ICD-10-CM

## 2018-04-19 ENCOUNTER — Encounter: Payer: Self-pay | Admitting: Nurse Practitioner

## 2018-04-19 NOTE — Progress Notes (Signed)
[facility]  PATIENT: Harry [name] DOB: [birth year]   REASON FOR VISIT: Follow-up for obstructive sleep apnea HISTORY FROM:Patient    HISTORY OF PRESENT ILLNESS:UPDATE 2/18/2020[name], 63 year old male returns for follow-up was established obstructive sleep apnea he recently got a new machine.  He is doing well it is much quieter he states.  Compliance data dated 03/21/2018-04/19/2018 shows compliance greater than 4 hours at 100%.  Average usage 8 hours 5 minutes.  Set pressure 5 to 12 cm.  EPR level 1.  Leak 95th percentile 7.6 AHI 1.7.  ESS 3.  He returns for reevaluation 12/29/17 CDRobert D [name] is a 63 y.o. male patient, seen here on 12-29-2017 for establishing sleep care, referral from Dr. Melford Aase.  Chief complaint according to patient : " I need to get a new machine"   I have the pleasure of meeting Harry [name]. Harry [name] today, a 63 year old right-handed Caucasian gentleman with a history of obstructive sleep apnea treated by CPAP for over 15 years.  He recalled that his last and only sleep study was at the [name] that he started CPAP after that, his current machine is well over 10 years old.  It is however an S9 AutoSet with a variable pressure setting, a rather advanced machine.  He is continuing to use it but it recently displayed a message that it was close to the end of his motor life.  He has been 100% compliant with an average of 8 hours and 5 minutes the AutoSet has a minimum pressure of 8, maximum pressure of 16 cmH2O and 2 cm expiratory pressure relief, his residual AHI is 2.0 equal amount of central and obstructive residual apneas as noted.  He uses a 95th percentile pressure of 11.2 cmH2O.  He does not have major air leaks, he is using a nasal mask, Mirage Fx small size. He is using a so -clean    Sleep habits are as follows: Works in a [name]- sits all day at a computer. Home by 6.30 pm , his Dinner between 7 and 8 Pm, and  followed by watching TV. Bedtime is 10-11 pm. The bedroom is cool, quiet, and dark , and shared with his wife. Small dog sleeps in the bed. No problems to initiate sleep, as long as CPAP is on. Sleeps on his back and sides, and on 2 pillows, flat bed. Sleeps until 4 am for one bathroom break, and goes immediately back to sleep. He dreams but has no nightmares. No dream enactment. He rises sponatanously at 6.30 AM. He feels refreshed but sometimes noted a dry mouth. No headaches, pain- as long as using CPAP>    REVIEW OF SYSTEMS: Full 14 system review of systems performed and notable only for those listed, all others are neg:  Constitutional: neg  Cardiovascular: neg Ear/Nose/Throat: neg  Skin: neg Eyes: neg Respiratory: neg Gastroitestinal: neg  Hematology/Lymphatic: neg  Endocrine: neg Musculoskeletal:neg Allergy/Immunology: neg Neurological: neg Psychiatric: neg Sleep : Obstructive sleep apnea with CPAP   ALLERGIES: Allergies  Allergen Reactions  . Crestor [Rosuvastatin] Cough    HOME MEDICATIONS: Outpatient Medications Prior to Visit  Medication Sig Dispense Refill  . aspirin 81 MG tablet Take 81 mg by mouth daily.    Marland Kitchen atorvastatin (LIPITOR) 40 MG tablet Take 1 tablet (40 mg total) by mouth daily. for cholesterol 90 tablet 1  . bisoprolol-hydrochlorothiazide (ZIAC) 5-6.25 MG tablet TAKE 1 TABLET DAILY FOR BLOOD PRESSURE 90 tablet 4  . Cholecalciferol (  VITAMIN D PO) Take 5,000 Units by mouth daily.     Marland Kitchen glucose 4 GM chewable tablet Chew 1 tablet (4 g total) by mouth as needed for low blood sugar. 30 tablet 12  . insulin glargine (LANTUS) 100 UNIT/ML injection Inject 0.5 mLs (50 Units total) into the skin daily. 30 mL 3  . insulin lispro (HUMALOG) 100 UNIT/ML injection INJECT 60 TO 80 UNITS UNDER THE SKIN PER DAY WITH MEALS DEPENDING ON MEALS (Patient taking differently: Inject 15-25 Units into the skin 3 (three) times daily with meals. ) 210 mL 4  . Insulin Syringe-Needle  U-100 (BD INSULIN SYRINGE U/F) 31G X 5/16" 0.5 ML MISC Inject Insulin 2 x /day as directed 200 each 99  . losartan-hydrochlorothiazide (HYZAAR) 100-12.5 MG tablet TAKE 1 TABLET DAILY 90 tablet 1  . Magnesium 500 MG TABS Take 1 tablet by mouth daily.    . metFORMIN (GLUCOPHAGE-XR) 500 MG 24 hr tablet TAKE 2 TABLETS TWICE A DAY WITH FOOD FOR DIABETES 360 tablet 4  . omeprazole (PRILOSEC) 20 MG capsule TAKE 1 CAPSULE DAILY FOR ACID REFLUX 90 capsule 3  . ONE TOUCH ULTRA TEST test strip USE 3 TO 4 TIMES A DAY FOR FLUCTUATING BLOOD SUGARS 400 each 3  . potassium chloride SA (K-DUR,KLOR-CON) 20 MEQ tablet TAKE 1 TABLET TWICE A DAY 180 tablet 4  . tadalafil (CIALIS) 20 MG tablet Take 1 tablet (20 mg total) by mouth daily as needed for erectile dysfunction. 60 tablet 3  . TRULICITY 1.5 JA/2.5KN SOPN INJECT 1.5 MG UNDER THE SKIN ONCE A WEEK 6 mL 4  . hyoscyamine (LEVSIN SL) 0.125 MG SL tablet Take 1 to 2 tablets 3 to 4 x day if needed for Nausea, vomiting, cramping or diarrhea. Dissolve under tongue. 90 tablet 0  . predniSONE (DELTASONE) 20 MG tablet 2 tablets daily for 3 days, 1 tablet daily for 4 days. 10 tablet 0   No facility-administered medications prior to visit.     PAST MEDICAL HISTORY: Past Medical History:  Diagnosis Date  . CKD (chronic kidney disease) stage 2, GFR 60-89 ml/min    secondary to DM  . Diabetes mellitus without complication ([place]) 3976   insulin requiring  . GERD (gastroesophageal reflux disease)   . Hyperlipidemia   . Hypertension   . Hypokalemia   . Hypomagnesemia   . OSA on CPAP    10-12 years ago  . Vitamin D deficiency     PAST SURGICAL HISTORY: Past Surgical History:  Procedure Laterality Date  . ANAL FISSURE REPAIR  2007  . VENTRAL HERNIA REPAIR  04/13/12   Dr. Johney Maine, ventral hernia repair w/ mesh    FAMILY HISTORY: Family History  Problem Relation Age of Onset  . Kidney cancer Father        kidney  . Stroke Maternal Grandmother   . Dementia  Paternal Grandmother        Early 43s  . Cancer Paternal Wynell Balloon of age or type  . Cancer Paternal Uncle     SOCIAL HISTORY: Social History   Socioeconomic History  . Marital status: Married    Spouse name: Not on file  . Number of children: 2  . Years of education: Not on file  . Highest education level: Not on file  Occupational History  . Not on file  Social Needs  . Financial resource strain: Not on file  . Food insecurity:    Worry: Not on  file    Inability: Not on file  . Transportation needs:    Medical: Not on file    Non-medical: Not on file  Tobacco Use  . Smoking status: Never Smoker  . Smokeless tobacco: Never Used  Substance and Sexual Activity  . Alcohol use: Yes    Alcohol/week: 2.0 - 5.0 standard drinks    Types: 2 - 5 Cans of beer per week    Comment: weekly  . Drug use: No  . Sexual activity: Yes  Lifestyle  . Physical activity:    Days per week: 1 day    Minutes per session: 50 min  . Stress: To some extent  Relationships  . Social connections:    Talks on phone: Not on file    Gets together: Not on file    Attends religious service: Not on file    Active member of club or organization: Not on file    Attends meetings of clubs or organizations: Not on file    Relationship status: Not on file  . Intimate partner violence:    Fear of current or ex partner: Not on file    Emotionally abused: Not on file    Physically abused: Not on file    Forced sexual activity: Not on file  Other Topics Concern  . Not on file  Social History Narrative  . Not on file     PHYSICAL EXAM  Vitals:   04/20/18 0754  BP: 110/65  Pulse: 66  Weight: 254 lb 6.4 oz (115.4 kg)  Height: 6' 1.5" (1.867 m)   Body mass index is 33.11 kg/m.  Generalized: Well developed, obese male in no acute distress  Head: normocephalic and atraumatic,. Oropharynx benign mallopatti4 Neck: Supple, circumference 19 Lungs : Clear Musculoskeletal: No  deformity  Skin no rash or edema Neurological examination   Mentation: Alert oriented to time, place, history taking. Attention span and concentration appropriate. Recent and remote memory intact.  Follows all commands speech and language fluent.   Cranial nerve II-XII: .Pupils were equal round reactive to light extraocular movements were full, visual field were full on confrontational test. Facial sensation and strength were normal. hearing was intact to finger rubbing bilaterally. Uvula tongue midline. head turning and shoulder shrug were normal and symmetric.Tongue protrusion into cheek strength was normal. Motor: normal bulk and tone, full strength in the BUE, BLE, Sensory: normal and symmetric to light touch,  Coordination: finger-nose-finger, heel-to-shin bilaterally, no dysmetria Gait and Station: Rising up from seated position without assistance, normal stance,  moderate stride, good arm swing, smooth turning, able to perform tiptoe, and heel walking without difficulty. Tandem gait is steady  DIAGNOSTIC DATA (LABS, IMAGING, TESTING) - I reviewed patient records, labs, notes, testing and imaging myself where available.  Lab Results  Component Value Date   WBC 5.7 03/11/2018   HGB 15.3 03/11/2018   HCT 46.9 03/11/2018   MCV 84.2 03/11/2018   PLT 262 03/11/2018      Component Value Date/Time   NA 138 03/11/2018 0919   K 3.8 03/11/2018 0919   CL 99 03/11/2018 0919   CO2 29 03/11/2018 0919   GLUCOSE 108 (H) 03/11/2018 0919   BUN 15 03/11/2018 0919   CREATININE 1.00 03/11/2018 0919   CALCIUM 9.6 03/11/2018 0919   PROT 6.7 03/11/2018 0919   ALBUMIN 3.7 06/02/2016 1541   AST 14 03/11/2018 0919   ALT 16 03/11/2018 0919   ALKPHOS 85 06/02/2016 1541   BILITOT 0.9 03/11/2018 0919  GFRNONAA 80 03/11/2018 0919   GFRAA 92 03/11/2018 0919   Lab Results  Component Value Date   CHOL 149 03/11/2018   HDL 47 03/11/2018   LDLCALC 75 03/11/2018   TRIG 174 (H) 03/11/2018   CHOLHDL  3.2 03/11/2018   Lab Results  Component Value Date   HGBA1C 6.4 (A) 02/01/2018   Lab Results  Component Value Date   VITAMINB12 372 08/04/2017   Lab Results  Component Value Date   TSH 2.44 03/11/2018      ASSESSMENT AND PLAN  63 y.o. year old male  has a past medical history of CKD (chronic kidney disease) stage 2, GFR 60-89 ml/min, Diabetes mellitus without complication ([place]) (5885),  Hyperlipidemia, Hypertension, Hypokalemia, Hypomagnesemia, OSA on CPAP, and Vitamin D deficiency. here to follow-up for obstructive sleep apnea with CPAP compliance. Compliance data dated 03/21/2018-04/19/2018 shows compliance greater than 4 hours at 100%.  Average usage 8 hours 5 minutes.  Set pressure 5 to 12 cm.  EPR level 1.  Leak 95th percentile 7.6 AHI 1.7.  ESS 3.   CPAP compliance 100% Continue same settings Follow-up yearly Dennie Bible, [facility], [facility], APRN  [facility] [address] [place], [place] [address] [phone]

## 2018-04-20 ENCOUNTER — Ambulatory Visit (INDEPENDENT_AMBULATORY_CARE_PROVIDER_SITE_OTHER): Payer: 59 | Admitting: Nurse Practitioner

## 2018-04-20 ENCOUNTER — Encounter: Payer: Self-pay | Admitting: Nurse Practitioner

## 2018-04-20 VITALS — BP 110/65 | HR 66 | Ht 73.5 in | Wt 254.4 lb

## 2018-04-20 DIAGNOSIS — Z9989 Dependence on other enabling machines and devices: Secondary | ICD-10-CM

## 2018-04-20 DIAGNOSIS — G4733 Obstructive sleep apnea (adult) (pediatric): Secondary | ICD-10-CM

## 2018-04-20 NOTE — Progress Notes (Signed)
Fax confirmation received for [phone] [employer] todays ofv note and Insurance card copy.  I relayed that [employer] told me that they were waiting for insurance to give approval for supplies.  He would call [employer] to day.

## 2018-04-20 NOTE — Patient Instructions (Signed)
CPAP compliance 100% Continue same settings Follow-up yearly  

## 2018-05-05 ENCOUNTER — Ambulatory Visit (INDEPENDENT_AMBULATORY_CARE_PROVIDER_SITE_OTHER): Payer: 59 | Admitting: Internal Medicine

## 2018-05-05 ENCOUNTER — Encounter: Payer: Self-pay | Admitting: Internal Medicine

## 2018-05-05 VITALS — BP 124/70 | HR 72 | Temp 97.3°F | Resp 18 | Ht 73.5 in | Wt 251.4 lb

## 2018-05-05 DIAGNOSIS — Z136 Encounter for screening for cardiovascular disorders: Secondary | ICD-10-CM

## 2018-05-05 DIAGNOSIS — E782 Mixed hyperlipidemia: Secondary | ICD-10-CM | POA: Diagnosis not present

## 2018-05-05 DIAGNOSIS — R079 Chest pain, unspecified: Secondary | ICD-10-CM

## 2018-05-05 DIAGNOSIS — Z8249 Family history of ischemic heart disease and other diseases of the circulatory system: Secondary | ICD-10-CM

## 2018-05-05 DIAGNOSIS — I10 Essential (primary) hypertension: Secondary | ICD-10-CM | POA: Diagnosis not present

## 2018-05-05 MED ORDER — MELOXICAM 15 MG PO TABS: ORAL_TABLET | ORAL | 3 refills | Status: DC

## 2018-05-05 NOTE — Progress Notes (Signed)
   Subjective:    Patient ID: Harry [name], male    DOB: 09-04-1955, 63 y.o.   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 Denies cough, dyspnea or exertional component. Patient admits still very aniiiiiiiiiiiiiiiiiiiiiiiii  Medication Sig  . aspirin 81 MG tablet Take 81 mg by mouth daily.  Marland Kitchen atorvastatin (LIPITOR) 40 MG tablet Take 1 tablet (40 mg total) by mouth daily. for cholesterol  . bisoprolol-hydrochlorothiazide (ZIAC) 5-6.25 MG tablet TAKE 1 TABLET DAILY FOR BLOOD PRESSURE  . VITAMIN D Take 5,000 Units by mouth daily.   Marland Kitchen glucose 4 GM chewable tablet Chew 1 tablet (as needed for low blood sugar.  Marland Kitchen LANTUS 100 UNIT/ML injection Inject 0.5 mLs (50 Units total) into the skin daily.  Marland Kitchen HUMALOG 100 UNIT/ML injec Inject 15-25 Units into the skin 3 (three) times daily with meals. )  . losartan-hctz 100-12.5 MG tablet TAKE 1 TABLET DAILY  . Magnesium 500 MG TABS Take 1 tablet by mouth daily.  . metFORMIN-XR 500 MG  TAKE 2 TAB TWICE A DAY WITH FOOD  . omeprazole (PRILOSEC) 20 MG capsule TAKE 1 CAPSULE DAILY FOR ACID REFLUX  . potassium chloride SA (K-DUR,KLOR-CON) 20 MEQ tablet TAKE 1 TABLET TWICE A DAY  . tadalafil  20 MG tablet Take 1 tablet (20 mg total) by mouth daily as needed for erectile dysfunction.  . TRULICITY 1.5 KN/3.9JQ SOPN INJECT 1.5 MG UNDER THE SKIN ONCE A WEEK   No facility-administered medications prior to visit.    Allergies  Allergen Reactions  . Crestor [Rosuvastatin] Cough   Past Medical History:  Diagnosis Date  . CKD (chronic kidney disease) stage 2, GFR 60-89 ml/min    secondary to DM  . Diabetes mellitus without complication ([place]) 7341   insulin requiring  . GERD  (gastroesophageal reflux disease)   . Hyperlipidemia   . Hypertension   . Hypokalemia   . Hypomagnesemia   . OSA on CPAP    10-12 years ago  . Vitamin D deficiency    Past Surgical History:  Procedure Laterality Date  . ANAL FISSURE REPAIR  2007  . VENTRAL HERNIA REPAIR  04/13/12   Dr. Johney Maine, ventral hernia repair w/ mesh      10 point systems review negative except as above.    Objective:   Physical Exam  BP 124/70   Pulse 72   Temp (!) 97.3 F (36.3 C)   Resp 18   Ht 6' 1.5" (1.867 m)   Wt 251 lb 6.4 oz (114 kg)   BMI 32.72 kg/m   HEENT - WNL. Neck - supple.  Chest - Clear equal BS. Tender chest wall about level Ribs 4-6/ AALexquisite tender   Cor - Nl HS. RRR w/o sig MGR. PP 1(+). No edema. MS- FROM w/o deformities.  Gait Nl. Neuro -  Nl w/o focal abnormalities.    Assessment & Plan:   1. Chest pain, chest wall  - EKG 12-Lead  - meloxicam (MOBIC) 15 MG tablet; Take 1/2 to 1 tablet daily with food for Pain & Inflammation  Dispense: 90 tablet; Refill: 3  - Recc try heating pad.

## 2018-05-24 ENCOUNTER — Other Ambulatory Visit: Payer: Self-pay

## 2018-05-25 ENCOUNTER — Ambulatory Visit (INDEPENDENT_AMBULATORY_CARE_PROVIDER_SITE_OTHER): Payer: 59 | Admitting: Adult Health

## 2018-05-25 ENCOUNTER — Encounter: Payer: Self-pay | Admitting: Internal Medicine

## 2018-05-25 ENCOUNTER — Other Ambulatory Visit: Payer: Self-pay

## 2018-05-25 ENCOUNTER — Encounter: Payer: Self-pay | Admitting: Adult Health

## 2018-05-25 ENCOUNTER — Ambulatory Visit: Payer: 59 | Admitting: Internal Medicine

## 2018-05-25 ENCOUNTER — Ambulatory Visit (INDEPENDENT_AMBULATORY_CARE_PROVIDER_SITE_OTHER): Payer: 59 | Admitting: Internal Medicine

## 2018-05-25 VITALS — BP 140/80 | HR 76 | Temp 97.8°F | Ht 73.5 in | Wt 255.0 lb

## 2018-05-25 VITALS — BP 110/72 | HR 69 | Temp 97.3°F | Ht 73.5 in | Wt 254.0 lb

## 2018-05-25 DIAGNOSIS — E782 Mixed hyperlipidemia: Secondary | ICD-10-CM

## 2018-05-25 DIAGNOSIS — S29012A Strain of muscle and tendon of back wall of thorax, initial encounter: Secondary | ICD-10-CM | POA: Diagnosis not present

## 2018-05-25 DIAGNOSIS — E669 Obesity, unspecified: Secondary | ICD-10-CM

## 2018-05-25 DIAGNOSIS — R109 Unspecified abdominal pain: Secondary | ICD-10-CM

## 2018-05-25 DIAGNOSIS — E139 Other specified diabetes mellitus without complications: Secondary | ICD-10-CM | POA: Diagnosis not present

## 2018-05-25 LAB — POCT GLYCOSYLATED HEMOGLOBIN (HGB A1C): HEMOGLOBIN A1C: 7.2 % — AB (ref 4.0–5.6)

## 2018-05-25 MED ORDER — CYCLOBENZAPRINE HCL 10 MG PO TABS: ORAL_TABLET | ORAL | 0 refills | Status: DC

## 2018-05-25 NOTE — Patient Instructions (Addendum)
Please continue:  Metformin ER 1000 mg 2x a day twice a day  Trulicity 1.5 mg weekly  Humalog: 10 to 20 units before a meal depending on the size of the meal  Lantus 50 units in a.m.  Please return in 4 months with your sugar log.

## 2018-05-25 NOTE — Patient Instructions (Addendum)
Recommend restart mobic for 2 weeks then can do 1/2-1 tab as needed  Flexeril as needed, caution may cause drowsiness - don't drive while taking, use lowest effective dose   Message me back next week to let me know how you're doing, or if there are any new symptoms  If not improving will order xray    Mid-Back Strain Rehab Ask your health care provider which exercises are safe for you. Do exercises exactly as told by your health care provider and adjust them as directed. It is normal to feel mild stretching, pulling, tightness, or discomfort as you do these exercises, but you should stop right away if you feel sudden pain or your pain gets worse. Do not begin these exercises until told by your health care provider. Stretching and range of motion exercises This exercise warms up your muscles and joints and improves the movement and flexibility of your back and shoulders. This exercise also help to relieve pain. Exercise A: Chest and spine stretch  1. Lie down on your back on a firm surface. 2. Roll a towel or a small blanket so it is about 4 inches (10 cm) in diameter. 3. Put the towel lengthwise under the middle of your back so it is under your spine, but not under your shoulder blades. 4. To increase the stretch, you may put your hands behind your head and let your elbows fall to your sides. 5. Hold for __________ seconds. Repeat exercise __________ times. Complete this exercise __________ times a day. Strengthening exercises These exercises build strength and endurance in your back and your shoulder blade muscles. Endurance is the ability to use your muscles for a long time, even after they get tired. Exercise B: Alternating arm and leg raises  1. Get on your hands and knees on a firm surface. If you are on a hard floor, you may want to use padding to cushion your knees, such as an exercise mat. 2. Line up your arms and legs. Your hands should be below your shoulders, and your knees  should be below your hips. 3. Lift your left leg behind you. At the same time, raise your right arm and straighten it in front of you. ? Do not lift your leg higher than your hip. ? Do not lift your arm higher than your shoulder. ? Keep your abdominal and back muscles tight. ? Keep your hips facing the ground. ? Do not arch your back. ? Keep your balance carefully, and do not hold your breath. 4. Hold for __________ seconds. 5. Slowly return to the starting position and repeat with your right leg and your left arm. Repeat __________ times. Complete this exercise __________ times a day. Exercise C: Straight arm rows (shoulder extension)  1. Stand with your feet shoulder width apart. 2. Secure an exercise band to a stable object in front of you so the band is at or above shoulder height. 3. Hold one end of the exercise band in each hand. 4. Straighten your elbows and lift your hands up to shoulder height. 5. Step back, away from the secured end of the exercise band, until the band stretches. 6. Squeeze your shoulder blades together and pull your hands down to the sides of your thighs. Stop when your hands are straight down by your sides. Do not let your hands go behind your body. 7. Hold for __________ seconds. 8. Slowly return to the starting position. Repeat __________ times. Complete this exercise __________ times a day. Exercise  D: Shoulder external rotation, prone 1. Lie on your abdomen on a firm bed so your left / right forearm hangs over the edge of the bed and your upper arm is on the bed, straight out from your body. ? Your elbow should be bent. ? Your palm should be facing your feet. 2. If instructed, hold a __________ weight in your hand. 3. Squeeze your shoulder blade toward the middle of your back. Do not let your shoulder lift toward your ear. 4. Keep your elbow bent in an "L" shape (90 degrees) while you slowly move your forearm up toward the ceiling. Move your forearm up to  the height of the bed, toward your head. ? Your upper arm should not move. ? At the top of the movement, your palm should face the floor. 5. Hold for __________ seconds. 6. Slowly return to the starting position and relax your muscles. Repeat __________ times. Complete this exercise __________ times a day. Exercise E: Scapular retraction and external rotation, rowing  1. Sit in a stable chair without armrests, or stand. 2. Secure an exercise band to a stable object in front of you so it is at shoulder height. 3. Hold one end of the exercise band in each hand. 4. Bring your arms out straight in front of you. 5. Step back, away from the secured end of the exercise band, until the band stretches. 6. Pull the band backward. As you do this, bend your elbows and squeeze your shoulder blades together, but avoid letting the rest of your body move. Do not let your shoulders lift up toward your ears. 7. Stop when your elbows are at your sides or slightly behind your body. 8. Hold for __________ seconds. 9. Slowly straighten your arms to return to the starting position. Repeat __________ times. Complete this exercise __________ times a day. Posture and body mechanics  Body mechanics refers to the movements and positions of your body while you do your daily activities. Posture is part of body mechanics. Good posture and healthy body mechanics can help to relieve stress in your body's tissues and joints. Good posture means that your spine is in its natural S-curve position (your spine is neutral), your shoulders are pulled back slightly, and your head is not tipped forward. The following are general guidelines for applying improved posture and body mechanics to your everyday activities. Standing   When standing, keep your spine neutral and your feet about hip-width apart. Keep a slight bend in your knees. Your ears, shoulders, and hips should line up.  When you do a task in which you lean forward while  standing in one place for a long time, place one foot up on a stable object that is 2-4 inches (5-10 cm) high, such as a footstool. This helps keep your spine neutral. Sitting   When sitting, keep your spine neutral and keep your feet flat on the floor. Use a footrest, if necessary, and keep your thighs parallel to the floor. Avoid rounding your shoulders, and avoid tilting your head forward.  When working at a desk or a computer, keep your desk at a height where your hands are slightly lower than your elbows. Slide your chair under your desk so you are close enough to maintain good posture.  When working at a computer, place your monitor at a height where you are looking straight ahead and you do not have to tilt your head forward or downward to look at the screen. Resting When lying  down and resting, avoid positions that are most painful for you.  If you have pain with activities such as sitting, bending, stooping, or squatting (flexion-based activities), lie in a position in which your body does not bend very much. For example, avoid curling up on your side with your arms and knees near your chest (fetal position).  If you have pain with activities such as standing for a long time or reaching with your arms (extension-based activities), lie with your spine in a neutral position and bend your knees slightly. Try the following positions:  Lying on your side with a pillow between your knees.  Lying on your back with a pillow under your knees.  Lifting   When lifting objects, keep your feet at least shoulder-width apart and tighten your abdominal muscles.  Bend your knees and hips and keep your spine neutral. It is important to lift using the strength of your legs, not your back. Do not lock your knees straight out.  Always ask for help to lift heavy or awkward objects. This information is not intended to replace advice given to you by your health care provider. Make sure you discuss any  questions you have with your health care provider. Document Released: 02/17/2005 Document Revised: 10/25/2015 Document Reviewed: 11/29/2014 Elsevier Interactive Patient Education  2019 [employer]     Cyclobenzaprine tablets What is this medicine? CYCLOBENZAPRINE (sye kloe BEN za preen) is a muscle relaxer. It is used to treat muscle pain, spasms, and stiffness. This medicine may be used for other purposes; ask your health care provider or pharmacist if you have questions. COMMON BRAND NAME(S): Fexmid, Flexeril What should I tell my health care provider before I take this medicine? They need to know if you have any of these conditions: -heart disease, irregular heartbeat, or previous heart attack -liver disease -thyroid problem -an unusual or allergic reaction to cyclobenzaprine, tricyclic antidepressants, lactose, other medicines, foods, dyes, or preservatives -pregnant or trying to get pregnant -breast-feeding How should I use this medicine? Take this medicine by mouth with a glass of water. Follow the directions on the prescription label. If this medicine upsets your stomach, take it with food or milk. Take your medicine at regular intervals. Do not take it more often than directed. Talk to your pediatrician regarding the use of this medicine in children. Special care may be needed. Overdosage: If you think you have taken too much of this medicine contact a poison control center or emergency room at once. NOTE: This medicine is only for you. Do not share this medicine with others. What if I miss a dose? If you miss a dose, take it as soon as you can. If it is almost time for your next dose, take only that dose. Do not take double or extra doses. What may interact with this medicine? Do not take this medicine with any of the following medications: -MAOIs like Carbex, Eldepryl, Marplan, Nardil, and Parnate This medicine may also interact with the following medications: -alcohol  -antihistamines for allergy, cough, and cold -certain medicines for anxiety or sleep -certain medicines for depression like amitriptyline, fluoxetine, sertraline -certain medicines for seizures like phenobarbital, primidone -contrast dyes -local anesthetics like lidocaine, pramoxine, tetracaine -medicines that relax muscles for surgery -narcotic medicines for pain -phenothiazines like chlorpromazine, mesoridazine, prochlorperazine This list may not describe all possible interactions. Give your health care provider a list of all the medicines, herbs, non-prescription drugs, or dietary supplements you use. Also tell them if you smoke, drink  alcohol, or use illegal drugs. Some items may interact with your medicine. What should I watch for while using this medicine? Tell your doctor or health care professional if your symptoms do not start to get better or if they get worse. You may get drowsy or dizzy. Do not drive, use machinery, or do anything that needs mental alertness until you know how this medicine affects you. Do not stand or sit up quickly, especially if you are an older patient. This reduces the risk of dizzy or fainting spells. Alcohol may interfere with the effect of this medicine. Avoid alcoholic drinks. If you are taking another medicine that also causes drowsiness, you may have more side effects. Give your health care provider a list of all medicines you use. Your doctor will tell you how much medicine to take. Do not take more medicine than directed. Call emergency for help if you have problems breathing or unusual sleepiness. Your mouth may get dry. Chewing sugarless gum or sucking hard candy, and drinking plenty of water may help. Contact your doctor if the problem does not go away or is severe. What side effects may I notice from receiving this medicine? Side effects that you should report to your doctor or health care professional as soon as possible: -allergic reactions like skin  rash, itching or hives, swelling of the face, lips, or tongue -breathing problems -chest pain -fast, irregular heartbeat -hallucinations -seizures -unusually weak or tired Side effects that usually do not require medical attention (report to your doctor or health care professional if they continue or are bothersome): -headache -nausea, vomiting This list may not describe all possible side effects. Call your doctor for medical advice about side effects. You may report side effects to FDA at 1-800-FDA-1088. Where should I keep my medicine? Keep out of the reach of children. Store at room temperature between 15 and 30 degrees C (59 and 86 degrees F). Keep container tightly closed. Throw away any unused medicine after the expiration date. NOTE: This sheet is a summary. It may not cover all possible information. If you have questions about this medicine, talk to your doctor, pharmacist, or health care provider.  2019 Elsevier/Gold Standard (2016-12-10 13:04:35)

## 2018-05-25 NOTE — Addendum Note (Signed)
Addended by: Cardell Peach I on: 05/25/2018 03:21 PM   Modules accepted: Orders

## 2018-05-25 NOTE — Progress Notes (Signed)
Assessment and Plan:  Tailor was seen today for back pain.  Diagnoses and all orders for this visit:  Strain of mid-back, initial encounter History and exam most suggestive of muscular strain; will check labs to r/o renal involvement due to patient preference No red flags Prednisone was not prescribed, will have him restart NSAIDs (mobic 15 mg daily which he has at home), flexeril, RICE, and exercise given If not better follow up, will get XR or will refer to PT/orthopedics. Natural history and expected course discussed. Questions answered. Neurosurgeon distributed. Proper lifting, bending technique discussed. Short (2-4 day) period of relative rest recommended until acute symptoms improve. Heat to affected area as needed for local pain relief. -     cyclobenzaprine (FLEXERIL) 10 MG tablet; Take 1/2-1 tab up to three times a day as needed for muscle spasms/back pain.  Flank pain, acute Check to r/o renal involvement though suspicion is very low due to duration and exam If not improving could get XR which might also demonstrate renal calculi if present though bilateral presentation and history doesn't strongly suggest this  -     CBC with Differential/Platelet -     Urinalysis w microscopic + reflex cultur -     COMPLETE METABOLIC PANEL WITH GFR   Further disposition pending results of labs. Discussed med's effects and SE's.   Over 30 minutes of exam, counseling, chart review, and critical decision making was performed.   Future Appointments  Date Time Provider [place]  06/18/2018 10:45 AM Vicie Mutters, PA-C [facility] None  09/20/2018  1:00 PM [name], MD [facility] None  09/21/2018 10:00 AM Liane Comber, NP [facility] None    ------------------------------------------------------------------------------------------------------------------   HPI BP 110/72   Pulse 69   Temp (!) 97.3 F (36.3 C)   Ht 6' 1.5" (1.867 m)   Wt 254 lb (115.2 kg)    SpO2 96%   BMI 33.06 kg/m    Harry [name] is a 63 y.o. male, R handed male, works a seated/sedentary job who presents for evaluation of bilateral bilateral flank pain. He has no significant documented orthopedic history.   He reports he assisted with moving his wife who was in hospice 1 month ago, felt he strained his lower back at the time though didn't have significant symptoms for a week. He denies history of back pain or known significant injury. He reports he started having bilateral flank pain 3 weeks ago, gradual onset, worse in the afternoons, improved in AM after resting.   Denies any symptoms other than pain. He denies fever/chills, changes in urinary characters or patters, n/v/d, abdominal pain, fatigue, parasthesias, weakness, loss of bladder or bowel control.    He describes pain as deep ache, localized, non-radiating, reports was 10/10 last night, wonders if aggravated after he mowed and worked in his garden the day previous. His impression has been a strained back. He has not tried any ibuprofen/tylenol. He has previous script for mobic 15 mg at home but hasn't tried. He did however try a single leftover dose of penicillin and attributes improvement in pain last night from 10 to 2 to this, today reports no pain at rest, 2/10 with aggravating movements ("bouncing," moving upper extremities above head, with very deep breaths). Denies loss of bladder/bowel control, parasthesias, weakness.   No hx of renal calculi.   Past Medical History:  Diagnosis Date  . CKD (chronic kidney disease) stage 2, GFR 60-89 ml/min    secondary to DM  . 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 GERD (gastroesophageal reflux disease)   . Hyperlipidemia   . Hypertension   . Hypokalemia   . Hypomagnesemia   . OSA on CPAP    10-12 years ago  . Vitamin D deficiency      Allergies  Allergen Reactions  . Crestor [Rosuvastatin] Cough    Current Outpatient  Medications on File Prior to Visit  Medication Sig  . aspirin 81 MG tablet Take 81 mg by mouth daily.  Marland Kitchen atorvastatin (LIPITOR) 40 MG tablet Take 1 tablet (40 mg total) by mouth daily. for cholesterol  . bisoprolol-hydrochlorothiazide (ZIAC) 5-6.25 MG tablet TAKE 1 TABLET DAILY FOR BLOOD PRESSURE  . Cholecalciferol (VITAMIN D PO) Take 5,000 Units by mouth daily.   Marland Kitchen glucose 4 GM chewable tablet Chew 1 tablet (4 g total) by mouth as needed for low blood sugar.  . insulin glargine (LANTUS) 100 UNIT/ML injection Inject 0.5 mLs (50 Units total) into the skin daily.  . insulin lispro (HUMALOG) 100 UNIT/ML injection INJECT 60 TO 80 UNITS UNDER THE SKIN PER DAY WITH MEALS DEPENDING ON MEALS (Patient taking differently: Inject 15-25 Units into the skin 3 (three) times daily with meals. )  . Insulin Syringe-Needle U-100 (BD INSULIN SYRINGE U/F) 31G X 5/16" 0.5 ML MISC Inject Insulin 2 x /day as directed  . losartan-hydrochlorothiazide (HYZAAR) 100-12.5 MG tablet TAKE 1 TABLET DAILY  . Magnesium 500 MG TABS Take 1 tablet by mouth daily.  . metFORMIN (GLUCOPHAGE-XR) 500 MG 24 hr tablet TAKE 2 TABLETS TWICE A DAY WITH FOOD FOR DIABETES  . omeprazole (PRILOSEC) 20 MG capsule TAKE 1 CAPSULE DAILY FOR ACID REFLUX  . ONE TOUCH ULTRA TEST test strip USE 3 TO 4 TIMES A DAY FOR FLUCTUATING BLOOD SUGARS  . potassium chloride SA (K-DUR,KLOR-CON) 20 MEQ tablet TAKE 1 TABLET TWICE A DAY  . tadalafil (CIALIS) 20 MG tablet Take 1 tablet (20 mg total) by mouth daily as needed for erectile dysfunction.  . TRULICITY 1.5 IR/6.7EL SOPN INJECT 1.5 MG UNDER THE SKIN ONCE A WEEK  . meloxicam (MOBIC) 15 MG tablet Take 1/2 to 1 tablet daily with food for Pain & Inflammation   No current facility-administered medications on file prior to visit.     ROS: all negative except above.   Physical Exam:  BP 110/72   Pulse 69   Temp (!) 97.3 F (36.3 C)   Ht 6' 1.5" (1.867 m)   Wt 254 lb (115.2 kg)   SpO2 96%   BMI 33.06  kg/m   General Appearance: Well nourished, in no apparent distress. Eyes: conjunctiva no swelling or erythema ENT/Mouth: Hearing normal.  Neck: Supple Respiratory: Respiratory effort normal, BS equal bilaterally without rales, rhonchi, wheezing or stridor.  Cardio: RRR with no MRGs. Brisk peripheral pulses without edema.  Abdomen: Soft, + BS.  Non tender, no guarding, rebound, hernias, masses. No CVA tenderness Lymphatics: Non tender without lymphadenopathy.  Musculoskeletal: Full ROM, bilateral paraspinal tenderness with firm pressure, 5/5 strength bilateral lower extremities, normal gait. Spinous processes without tenderness.  Skin: Warm, dry without rashes, lesions, ecchymosis.  Neuro: Normal muscle tone, Sensation intact.  Psych: Awake and oriented X 3, normal affect, Insight and Judgment appropriate.     Izora Ribas, NP 2:30 PM [facility]

## 2018-05-25 NOTE — Progress Notes (Signed)
Patient ID: Harry [name], male   DOB: 10-21-1955, 63 y.o.   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 Last visit 3.5 months ago.  His wife passed away with cancer last mo. Sugars much higher in 2-3 weeks then  - no regular meals, missed med doses(up to 350).  Now, his sugar started to improve.  He has back pain  - wonders if he has a kidney stone.  Last hemoglobin A1c was: Lab Results  Component Value Date   HGBA1C 6.4 (A) 02/01/2018   HGBA1C 6.2 (A) 09/29/2017   HGBA1C 7.0 05/26/2017   He is on:  Metformin ER 1000 mg 2x a day twice a day  Trulicity 1.5 mg weekly  Humalog: 10 to 30 units before a meal depending on the size of the meal  Lantus 50 units in a.m.  Pt checks his sugars twice a day: - am:  85-140 >> 78-150 >> 82-140 >> 87-190 (but now 95-145) - 2h after b'fast: 180-313 >> n/c >> 165 >> n/c - before lunch: (L: 25 units) >> 164-325 >> n/c  - 2h after lunch: n/c >> 354, 362 >> 72 >> n/c - before dinner: 268 >> n/c (D: 25 units) >> n/c  - 2h after dinner: 117, 169-440 >>> n/c  - bedtime: 85-130, 250-280 (missed lunch insulin) >> 120-200, 300 (may take 5-10 units) >> now 97-160 - nighttime: n/c >>> 66 >> n/c Lowest sugar was 30s  ...>>...51 x1 >> low (could not check) - 4 pm >> 87; he has hypoglycemia awareness in the 60s. Highest sugar was in the 280 >>  200s (missing lunch insulin) >> 350.  Insulin doses used: Am: 10-15 units >> 12-20 units >> 10-15 units Lunch: 20-25 units >> 20 (-25) units >> 20 units Dinner: 20 units >> 20 (-25) units >> 20 units +/-  5 units at bedtime  Glucometer: one touch ultra 2  Pt's meals are: - Breakfast: Glucerna, banana - Lunch: eats out - salad with chicken; chicken burrito; sandwich; chick-fil-a - Dinner: meat + veggie + starch - Snacks: potato  chips, fruit, icecream No sodas.  -+ Mild CKD, last BUN/creatinine:  Lab Results  Component Value Date   BUN 15 03/11/2018   BUN 22 11/30/2017   CREATININE 1.00 03/11/2018   CREATININE 1.02 11/30/2017  On losartan. -+ HL; last set of lipids: Lab Results  Component Value Date   CHOL 149 03/11/2018   HDL 47 03/11/2018   LDLCALC 75 03/11/2018   TRIG 174 (H) 03/11/2018   CHOLHDL 3.2 03/11/2018  On Lipitor. - last eye exam was in 05/2017: No DR-Dr. Gershon Crane -No numbness and tingling in his feet.  He also has a history of HTN, GERD, OSA-on CPAP.  ROS: Constitutional: no weight gain/+ weight loss, no fatigue, no subjective hyperthermia, no subjective hypothermia Eyes: no blurry vision, no xerophthalmia ENT: no sore throat, no nodules palpated in neck, no dysphagia, no odynophagia, no hoarseness Cardiovascular: no CP/no SOB/no palpitations/no leg swelling Respiratory: no cough/no SOB/no wheezing Gastrointestinal: no N/no V/no D/no C/no acid reflux Musculoskeletal: + muscle aches/+ joint aches Skin: no rashes, no hair loss Neurological: no tremors/no numbness/no tingling/no dizziness  I reviewed pt's medications, allergies, PMH, social hx, family hx, and changes were documented in the history of present illness. Otherwise, unchanged from my  initial visit note.  Past Medical History:  Diagnosis Date  . CKD (chronic kidney disease) stage 2, GFR 60-89 ml/min    secondary to DM  . Diabetes mellitus without complication ([place]) 2979   insulin requiring  . GERD (gastroesophageal reflux disease)   . Hyperlipidemia   . Hypertension   . Hypokalemia   . Hypomagnesemia   . OSA on CPAP    10-12 years ago  . Vitamin D deficiency    Past Surgical History:  Procedure Laterality Date  . ANAL FISSURE REPAIR  2007  . VENTRAL HERNIA REPAIR  04/13/12   Dr. Johney Maine, ventral hernia repair w/ mesh   Social History   Social History  . Marital status: Married    Spouse name: N/A  . Number of  children: 2   Occupational History  . estimator   Social History Main Topics  . Smoking status: Never Smoker  . Smokeless tobacco: Not on file  . Alcohol use 0.0 oz/week    2 - 5 Cans of beer per week   Current Outpatient Medications on File Prior to Visit  Medication Sig Dispense Refill  . aspirin 81 MG tablet Take 81 mg by mouth daily.    Marland Kitchen atorvastatin (LIPITOR) 40 MG tablet Take 1 tablet (40 mg total) by mouth daily. for cholesterol 90 tablet 1  . bisoprolol-hydrochlorothiazide (ZIAC) 5-6.25 MG tablet TAKE 1 TABLET DAILY FOR BLOOD PRESSURE 90 tablet 4  . Cholecalciferol (VITAMIN D PO) Take 5,000 Units by mouth daily.     Marland Kitchen glucose 4 GM chewable tablet Chew 1 tablet (4 g total) by mouth as needed for low blood sugar. 30 tablet 12  . insulin glargine (LANTUS) 100 UNIT/ML injection Inject 0.5 mLs (50 Units total) into the skin daily. 30 mL 3  . insulin lispro (HUMALOG) 100 UNIT/ML injection INJECT 60 TO 80 UNITS UNDER THE SKIN PER DAY WITH MEALS DEPENDING ON MEALS (Patient taking differently: Inject 15-25 Units into the skin 3 (three) times daily with meals. ) 210 mL 4  . Insulin Syringe-Needle U-100 (BD INSULIN SYRINGE U/F) 31G X 5/16" 0.5 ML MISC Inject Insulin 2 x /day as directed 200 each 99  . losartan-hydrochlorothiazide (HYZAAR) 100-12.5 MG tablet TAKE 1 TABLET DAILY 90 tablet 1  . Magnesium 500 MG TABS Take 1 tablet by mouth daily.    . meloxicam (MOBIC) 15 MG tablet Take 1/2 to 1 tablet daily with food for Pain & Inflammation 90 tablet 3  . metFORMIN (GLUCOPHAGE-XR) 500 MG 24 hr tablet TAKE 2 TABLETS TWICE A DAY WITH FOOD FOR DIABETES 360 tablet 4  . omeprazole (PRILOSEC) 20 MG capsule TAKE 1 CAPSULE DAILY FOR ACID REFLUX 90 capsule 3  . ONE TOUCH ULTRA TEST test strip USE 3 TO 4 TIMES A DAY FOR FLUCTUATING BLOOD SUGARS 400 each 3  . potassium chloride SA (K-DUR,KLOR-CON) 20 MEQ tablet TAKE 1 TABLET TWICE A DAY 180 tablet 4  . tadalafil (CIALIS) 20 MG tablet Take 1 tablet (20 mg  total) by mouth daily as needed for erectile dysfunction. 60 tablet 3  . TRULICITY 1.5 GX/2.1JH SOPN INJECT 1.5 MG UNDER THE SKIN ONCE A WEEK 6 mL 4   No current facility-administered medications on file prior to visit.    Allergies  Allergen Reactions  . Crestor [Rosuvastatin] Cough   Family History  Problem Relation Age of Onset  . Kidney cancer Father        kidney  . Stroke Maternal Grandmother   .  Dementia Paternal Grandmother        Early 55s  . Cancer Paternal Wynell Balloon of age or type  . Cancer Paternal Uncle     PE: BP 140/80   Pulse 76   Temp 97.8 F (36.6 C)   Ht 6' 1.5" (1.867 m)   Wt 255 lb (115.7 kg)   SpO2 98%   BMI 33.19 kg/m  Wt Readings from Last 3 Encounters:  05/25/18 255 lb (115.7 kg)  05/05/18 251 lb 6.4 oz (114 kg)  04/20/18 254 lb 6.4 oz (115.4 kg)   Constitutional: overweight, in NAD Eyes: PERRLA, EOMI, no exophthalmos ENT: moist mucous membranes, no thyromegaly, no cervical lymphadenopathy Cardiovascular: RRR, No MRG Respiratory: CTA B Gastrointestinal: abdomen soft, NT, ND, BS+ Musculoskeletal: no deformities, strength intact in all 4, no CVA tenderness Skin: moist, warm, no rashes Neurological: no tremor with outstretched hands, DTR normal in all 4  ASSESSMENT: 1. LADA, insulin-dependent, uncontrolled, with complications - CKD stage 2 - ED  - he is interested in an insulin pump  - he has an elliptical machine at home  Component     Latest Ref Rng & Units 07/11/2016  Hemoglobin A1C      6.5  Glutamic Acid Decarb Ab     <5 IU/mL 47 (H)  Pancreatic Islet Cell Antibody     <5 JDF Units <5  C-Peptide     0.80 - 3.85 ng/mL 0.37 (L)  Glucose, Fasting     65 - 99 mg/dL 104 (H)   Elevated GAD antibodies and decreased insulin production. 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 HL  3.  Obesity class I BMI Classification:  <  18.5 underweight   18.5-24.9 normal weight   25.0-29.9 overweight   30.0-34.9 class I obesity   35.0-39.9 class II obesity   ? 40.0 class III obesity   PLAN:  1. Patient with longstanding, uncontrolled, insulin-dependent diabetes (LADA) with improved sugars in the past after he reduced snacking.  He developed mild lows, in the 50s and 60s and we had to decrease the Lantus dose.  HbA1c decreased significantly, to 6.2%.  At last visit, sugars were still controlled in the morning, but they were increasing towards the end of the day as he was still forgetting to take his lunchtime insulin.  He was then correcting his bedtime sugars with a low-dose of Humalog.  We discussed that he should only correct if the sugars are higher than 300s, but ideally, he would not get to these high levels.  We moved Lantus together in 1 dose and I strongly advised him not to forget Humalog.  He was also forgetting Trulicity doses and I advised him to move these on Sundays. -Since last visit, he had major stress with his wife passing away last month after a long battle with cancer.  His sugars got very high around that time, higher than 300s, and they slowly started to improve in the last 2 weeks.  As of now, they appear improved compared to last visit, especially after dinner.  Therefore, we will not change his regimen for now.  We may be able to decrease his Lantus dose at next visit.  He mentions that he occasionally feels his sugars dropping before lunch, but he did not check them.  I advised him to check sugars whenever he feels  like this. - He is interested in an insulin pump but he needs to discuss with insurance to see which one is covered - I advised him to  to:  Patient Instructions  Please continue:  Metformin ER 1000 mg 2x a day twice a day  Trulicity 1.5 mg weekly  Humalog: 10 to 20 units before a meal depending on the size of the meal  Lantus 50 units in a.m.  Please return in 4 months with your  sugar log.    - today, HbA1c is 7.2% (higher) - continue checking sugars at different times of the day - check 3-4 x a day, rotating checks - advised for yearly eye exams >> he is UTD, but due soon (he missed an appointment and needs to reschedule) - Return to clinic in 4 mo with sugar log    2. HL - Reviewed latest lipid panel from 2 months ago: Triglycerides high, the rest of the fractions at goal: Lab Results  Component Value Date   CHOL 149 03/11/2018   HDL 47 03/11/2018   LDLCALC 75 03/11/2018   TRIG 174 (H) 03/11/2018   CHOLHDL 3.2 03/11/2018  - Continues Lipitor without side effects.  3.  Obesity class I -Continue Trulicity which should also help with weight loss. -lost 5 lbs since last OV   [name], MD PhD [facility] End[facility]

## 2018-05-26 LAB — CBC WITH DIFFERENTIAL/PLATELET
Absolute Monocytes: 540 cells/uL (ref 200–950)
BASOS ABS: 99 {cells}/uL (ref 0–200)
BASOS PCT: 1.4 %
EOS PCT: 1.3 %
Eosinophils Absolute: 92 cells/uL (ref 15–500)
HEMATOCRIT: 44.9 % (ref 38.5–50.0)
HEMOGLOBIN: 15.2 g/dL (ref 13.2–17.1)
LYMPHS ABS: 1377 {cells}/uL (ref 850–3900)
MCH: 28.5 pg (ref 27.0–33.0)
MCHC: 33.9 g/dL (ref 32.0–36.0)
MCV: 84.2 fL (ref 80.0–100.0)
MONOS PCT: 7.6 %
MPV: 11.2 fL (ref 7.5–12.5)
NEUTROS ABS: 4991 {cells}/uL (ref 1500–7800)
Neutrophils Relative %: 70.3 %
Platelets: 210 10*3/uL (ref 140–400)
RBC: 5.33 10*6/uL (ref 4.20–5.80)
RDW: 13.2 % (ref 11.0–15.0)
Total Lymphocyte: 19.4 %
WBC: 7.1 10*3/uL (ref 3.8–10.8)

## 2018-05-26 LAB — URINALYSIS W MICROSCOPIC + REFLEX CULTURE
BACTERIA UA: NONE SEEN /HPF
Bilirubin Urine: NEGATIVE
HGB URINE DIPSTICK: NEGATIVE
Hyaline Cast: NONE SEEN /LPF
KETONES UR: NEGATIVE
LEUKOCYTE ESTERASE: NEGATIVE
Nitrites, Initial: NEGATIVE
Protein, ur: NEGATIVE
RBC / HPF: NONE SEEN /HPF (ref 0–2)
SPECIFIC GRAVITY, URINE: 1.025 (ref 1.001–1.03)
Squamous Epithelial / LPF: NONE SEEN /HPF (ref <=5)
WBC UA: NONE SEEN /HPF (ref 0–5)

## 2018-05-26 LAB — COMPLETE METABOLIC PANEL WITH GFR
AG RATIO: 1.7 (calc) (ref 1.0–2.5)
ALBUMIN MSPROF: 4 g/dL (ref 3.6–5.1)
ALKALINE PHOSPHATASE (APISO): 76 U/L (ref 35–144)
ALT: 23 U/L (ref 9–46)
AST: 22 U/L (ref 10–35)
BUN: 16 mg/dL (ref 7–25)
CALCIUM: 9.1 mg/dL (ref 8.6–10.3)
CO2: 27 mmol/L (ref 20–32)
CREATININE: 1.06 mg/dL (ref 0.70–1.25)
Chloride: 101 mmol/L (ref 98–110)
GFR, EST NON AFRICAN AMERICAN: 74 mL/min/{1.73_m2} (ref >=60)
GFR, Est African American: 86 mL/min/{1.73_m2} (ref >=60)
GLOBULIN: 2.3 g/dL (ref 1.9–3.7)
Glucose, Bld: 208 mg/dL — ABNORMAL HIGH (ref 65–99)
POTASSIUM: 3.6 mmol/L (ref 3.5–5.3)
SODIUM: 137 mmol/L (ref 135–146)
Total Bilirubin: 0.8 mg/dL (ref 0.2–1.2)
Total Protein: 6.3 g/dL (ref 6.1–8.1)

## 2018-05-26 LAB — NO CULTURE INDICATED

## 2018-06-14 ENCOUNTER — Other Ambulatory Visit: Payer: Self-pay

## 2018-06-14 ENCOUNTER — Other Ambulatory Visit: Payer: 59

## 2018-06-14 DIAGNOSIS — I10 Essential (primary) hypertension: Secondary | ICD-10-CM

## 2018-06-14 DIAGNOSIS — E782 Mixed hyperlipidemia: Secondary | ICD-10-CM

## 2018-06-14 DIAGNOSIS — E1122 Type 2 diabetes mellitus with diabetic chronic kidney disease: Secondary | ICD-10-CM

## 2018-06-14 DIAGNOSIS — E559 Vitamin D deficiency, unspecified: Secondary | ICD-10-CM

## 2018-06-14 DIAGNOSIS — Z79899 Other long term (current) drug therapy: Secondary | ICD-10-CM

## 2018-06-14 DIAGNOSIS — N182 Chronic kidney disease, stage 2 (mild): Secondary | ICD-10-CM

## 2018-06-15 LAB — COMPLETE METABOLIC PANEL WITH GFR
AG Ratio: 1.9 (calc) (ref 1.0–2.5)
ALT: 19 U/L (ref 9–46)
AST: 18 U/L (ref 10–35)
Albumin: 4.2 g/dL (ref 3.6–5.1)
Alkaline phosphatase (APISO): 69 U/L (ref 35–144)
BUN: 16 mg/dL (ref 7–25)
CO2: 29 mmol/L (ref 20–32)
Calcium: 9.4 mg/dL (ref 8.6–10.3)
Chloride: 102 mmol/L (ref 98–110)
Creat: 0.97 mg/dL (ref 0.70–1.25)
GFR, Est African American: 95 mL/min/{1.73_m2} (ref >=60)
GFR, Est Non African American: 82 mL/min/{1.73_m2} (ref >=60)
Globulin: 2.2 g/dL (calc) (ref 1.9–3.7)
Glucose, Bld: 47 mg/dL — ABNORMAL LOW (ref 65–99)
Potassium: 3.6 mmol/L (ref 3.5–5.3)
Sodium: 139 mmol/L (ref 135–146)
Total Bilirubin: 0.9 mg/dL (ref 0.2–1.2)
Total Protein: 6.4 g/dL (ref 6.1–8.1)

## 2018-06-15 LAB — VITAMIN D 25 HYDROXY (VIT D DEFICIENCY, FRACTURES): Vit D, 25-Hydroxy: 40 ng/mL (ref 30–100)

## 2018-06-15 LAB — HEMOGLOBIN A1C
Hgb A1c MFr Bld: 6.9 % of total Hgb — ABNORMAL HIGH (ref <5.7)
Mean Plasma Glucose: 151 (calc)
eAG (mmol/L): 8.4 (calc)

## 2018-06-15 LAB — LIPID PANEL
Cholesterol: 126 mg/dL (ref <200)
HDL: 51 mg/dL (ref >=40)
LDL Cholesterol (Calc): 60 mg/dL (calc)
Non-HDL Cholesterol (Calc): 75 mg/dL (calc) (ref <130)
Total CHOL/HDL Ratio: 2.5 (calc) (ref <5.0)
Triglycerides: 72 mg/dL (ref <150)

## 2018-06-15 LAB — CBC WITH DIFFERENTIAL/PLATELET
Absolute Monocytes: 644 cells/uL (ref 200–950)
Basophils Absolute: 80 cells/uL (ref 0–200)
Basophils Relative: 1.4 %
Eosinophils Absolute: 143 cells/uL (ref 15–500)
Eosinophils Relative: 2.5 %
HCT: 46.4 % (ref 38.5–50.0)
Hemoglobin: 15.4 g/dL (ref 13.2–17.1)
Lymphs Abs: 1505 cells/uL (ref 850–3900)
MCH: 28.1 pg (ref 27.0–33.0)
MCHC: 33.2 g/dL (ref 32.0–36.0)
MCV: 84.5 fL (ref 80.0–100.0)
MPV: 11.2 fL (ref 7.5–12.5)
Monocytes Relative: 11.3 %
Neutro Abs: 3329 cells/uL (ref 1500–7800)
Neutrophils Relative %: 58.4 %
Platelets: 225 10*3/uL (ref 140–400)
RBC: 5.49 10*6/uL (ref 4.20–5.80)
RDW: 13.2 % (ref 11.0–15.0)
Total Lymphocyte: 26.4 %
WBC: 5.7 10*3/uL (ref 3.8–10.8)

## 2018-06-15 LAB — TSH: TSH: 3.5 mIU/L (ref 0.40–4.50)

## 2018-06-15 LAB — MAGNESIUM: Magnesium: 1.9 mg/dL (ref 1.5–2.5)

## 2018-06-17 NOTE — Progress Notes (Signed)
3 MONTH FOLLOW UP   THIS ENCOUNTER IS A VIRTUAL/TELEPHONE VISIT DUE TO COVID-19 - PATIENT WAS NOT SEEN IN THE OFFICE.  PATIENT HAS CONSENTED TO VIRTUAL VISIT / TELEMEDICINE VISIT  This provider placed a call to Harry [name] using telephone, his appointment was changed to a virtual office visit to reduce the risk of exposure to the COVID-19 virus and to help Harry [name] remain healthy and safe. The virtual visit will also provide continuity of care. He verbalizes understanding.   Labs done prior to visit and discussed with patient.    Assessment and Plan:  Essential hypertension - continue medications, DASH diet, exercise and monitor at home. Call if greater than 130/80.  -     CBC with Differential/Platelet -     BASIC METABOLIC PANEL WITH GFR -     Hepatic function panel -     TSH  Aneurysm, ascending aorta (HCC) Control blood pressure, cholesterol, glucose, increase exercise.   Obesity hypoventilation syndrome (HCC) Continue CPAP, got a new one.   Uncontrolled type 2 diabetes mellitus with diabetic nephropathy ([place]) Discussed general issues about diabetes pathophysiology and management., Educational material distributed., Suggested low cholesterol diet., Encouraged aerobic exercise., Discussed foot care., Reminded to get yearly retinal exam.  CKD stage 2 due to type 2 diabetes mellitus ([place]) Discussed general issues about diabetes pathophysiology and management., Educational material distributed., Suggested low cholesterol diet., Encouraged aerobic exercise., Discussed foot care., Reminded to get yearly retinal exam.  Erectile dysfunction associated with type 2 diabetes mellitus ([place]) Sugars doing better  Hyperlipidemia, unspecified hyperlipidemia type -continue medications, check lipids, decrease fatty foods, increase activity.  -     Lipid panel  Obesity with body mass index (BMI) of 30.0 to 39.9 - follow up 3 months for progress monitoring - increase veggies,  decrease carbs - long discussion about weight loss, diet, and exercise  Medication management -     Magnesium  Discussed med's effects and SE's. Screening labs and tests as requested with regular follow-up as recommended. Future Appointments  Date Time Provider Impact  09/20/2018  1:00 PM Harry Kingdom, MD [id number] None  09/21/2018 10:00 AM Harry Comber, NP [facility] None    HPI Patient presents for 3 month follow up for HTN, chol, DM with CKD, AAA.   He is working at home right now. He has 2 grand kids, 76.2 and 50 years old.   His blood pressure has not been checked at home. He does not workout. He denies chest pain, shortness of breath, dizziness, shortness breath.  BMI is There is no height or weight on file to calculate BMI., he is working on diet and exercise. Wt Readings from Last 3 Encounters:  05/25/18 254 lb (115.2 kg)  05/25/18 255 lb (115.7 kg)  05/05/18 251 lb 6.4 oz (114 kg)    Had normal stress test and echo with Dr. Marigene [name], has ascending aorta dilitation that is being monitored.   He is on cholesterol medication, lipitor 40mg  and denies myalgias. His cholesterol is at goal. The cholesterol last visit was:   Lab Results  Component Value Date   CHOL 126 06/14/2018   HDL 51 06/14/2018   LDLCALC 60 06/14/2018   TRIG 72 06/14/2018   CHOLHDL 2.5 06/14/2018   He has been working on diet and exercise for insulin dependent diabetes with CKD stage 2, he has had DM x 30 + years, he is on bASA, he is on ACE/ARB, he is checking his sugars at  night, he is now following with Dr. Cruzita [name], Dr. Gershon [name] eye exam and is still on lantus 50 units in AM, and on humolog 12-20 units with meals depending on size of the meal which has been less since he is eating at home, he has had some low sugars lowest 65, highest is 170-180, sugar this AM was 210 and last night was 135, normally not taking any humolog at bed time and denies paresthesia of the feet, polydipsia, polyuria  and visual disturbances. Last A1C in the office was:  Lab Results  Component Value Date   HGBA1C 6.9 (H) 06/14/2018   Patient is on Vitamin D supplement.   Lab Results  Component Value Date   VD25OH 40 06/14/2018   BMI is There is no height or weight on file to calculate BMI., he is working on diet and exercise. Due to his obesity he has OSA and is on CPAP.  Wt Readings from Last 3 Encounters:  05/25/18 254 lb (115.2 kg)  05/25/18 255 lb (115.7 kg)  05/05/18 251 lb 6.4 oz (114 kg)     Current Medications:  Current Outpatient Medications on File Prior to Visit  Medication Sig Dispense Refill  . aspirin 81 MG tablet Take 81 mg by mouth daily.    Marland Kitchen atorvastatin (LIPITOR) 40 MG tablet Take 1 tablet (40 mg total) by mouth daily. for cholesterol 90 tablet 1  . bisoprolol-hydrochlorothiazide (ZIAC) 5-6.25 MG tablet TAKE 1 TABLET DAILY FOR BLOOD PRESSURE 90 tablet 4  . Cholecalciferol (VITAMIN D PO) Take 5,000 Units by mouth daily.     . cyclobenzaprine (FLEXERIL) 10 MG tablet Take 1/2-1 tab up to three times a day as needed for muscle spasms/back pain. 60 tablet 0  . glucose 4 GM chewable tablet Chew 1 tablet (4 g total) by mouth as needed for low blood sugar. 30 tablet 12  . insulin glargine (LANTUS) 100 UNIT/ML injection Inject 0.5 mLs (50 Units total) into the skin daily. 30 mL 3  . insulin lispro (HUMALOG) 100 UNIT/ML injection INJECT 60 TO 80 UNITS UNDER THE SKIN PER DAY WITH MEALS DEPENDING ON MEALS (Patient taking differently: Inject 15-25 Units into the skin 3 (three) times daily with meals. ) 210 mL 4  . Insulin Syringe-Needle U-100 (BD INSULIN SYRINGE U/F) 31G X 5/16" 0.5 ML MISC Inject Insulin 2 x /day as directed 200 each 99  . losartan-hydrochlorothiazide (HYZAAR) 100-12.5 MG tablet TAKE 1 TABLET DAILY 90 tablet 1  . Magnesium 500 MG TABS Take 1 tablet by mouth daily.    . meloxicam (MOBIC) 15 MG tablet Take 1/2 to 1 tablet daily with food for Pain & Inflammation 90 tablet 3  .  metFORMIN (GLUCOPHAGE-XR) 500 MG 24 hr tablet TAKE 2 TABLETS TWICE A DAY WITH FOOD FOR DIABETES 360 tablet 4  . omeprazole (PRILOSEC) 20 MG capsule TAKE 1 CAPSULE DAILY FOR ACID REFLUX 90 capsule 3  . ONE TOUCH ULTRA TEST test strip USE 3 TO 4 TIMES A DAY FOR FLUCTUATING BLOOD SUGARS 400 each 3  . potassium chloride SA (K-DUR,KLOR-CON) 20 MEQ tablet TAKE 1 TABLET TWICE A DAY 180 tablet 4  . tadalafil (CIALIS) 20 MG tablet Take 1 tablet (20 mg total) by mouth daily as needed for erectile dysfunction. 60 tablet 3  . TRULICITY 1.5 TK/2.4OX SOPN INJECT 1.5 MG UNDER THE SKIN ONCE A WEEK 6 mL 4   No current facility-administered medications on file prior to visit.     Allergies:  Allergies  Allergen Reactions  . Crestor [Rosuvastatin] Cough   Medical History:  Past Medical History:  Diagnosis Date  . CKD (chronic kidney disease) stage 2, GFR 60-89 ml/min    secondary to DM  . Diabetes mellitus without complication ([place]) 1610   insulin requiring  . GERD (gastroesophageal reflux disease)   . Hyperlipidemia   . Hypertension   . Hypokalemia   . Hypomagnesemia   . OSA on CPAP    10-12 years ago  . Vitamin D deficiency    Surgical History: reviewed and unchanged Family History: reviewed and unchanged Social History: reviewed and unchanged  Review of Systems:  Review of Systems  Constitutional: Negative.   HENT: Negative.   Eyes: Negative.   Respiratory: Negative for cough, hemoptysis, sputum production, shortness of breath and wheezing.   Cardiovascular: Negative.   Gastrointestinal: Negative for abdominal pain and diarrhea. Heartburn: better with PPI.  Genitourinary: Negative.   Musculoskeletal: Negative.   Skin: Negative.   Neurological: Negative.   Endo/Heme/Allergies: Negative.   Psychiatric/Behavioral: Negative.     Physical Exam: Estimated body mass index is 33.06 kg/m as calculated from the following:   Height as of 05/25/18: 6' 1.5" (1.867 m).   Weight as of 05/25/18:  254 lb (115.2 kg). There were no vitals taken for this visit.  General Appearance:Well sounding, in no apparent distress.  ENT/Mouth: No hoarseness, No cough for duration of visit.  Respiratory: completing full sentences without distress, without audible wheeze Neuro: Awake and oriented X 3,  Psych:  Insight and Judgment appropriate.   Vicie Mutters 10:49 AM [facility]

## 2018-06-18 ENCOUNTER — Encounter: Payer: Self-pay | Admitting: Physician Assistant

## 2018-06-18 ENCOUNTER — Other Ambulatory Visit: Payer: Self-pay

## 2018-06-18 ENCOUNTER — Ambulatory Visit: Payer: 59 | Admitting: Physician Assistant

## 2018-06-18 DIAGNOSIS — E1122 Type 2 diabetes mellitus with diabetic chronic kidney disease: Secondary | ICD-10-CM

## 2018-06-18 DIAGNOSIS — I7121 Aneurysm of the ascending aorta, without rupture: Secondary | ICD-10-CM

## 2018-06-18 DIAGNOSIS — I712 Thoracic aortic aneurysm, without rupture: Secondary | ICD-10-CM | POA: Diagnosis not present

## 2018-06-18 DIAGNOSIS — I10 Essential (primary) hypertension: Secondary | ICD-10-CM | POA: Diagnosis not present

## 2018-06-18 DIAGNOSIS — N521 Erectile dysfunction due to diseases classified elsewhere: Secondary | ICD-10-CM

## 2018-06-18 DIAGNOSIS — E139 Other specified diabetes mellitus without complications: Secondary | ICD-10-CM

## 2018-06-18 DIAGNOSIS — E782 Mixed hyperlipidemia: Secondary | ICD-10-CM

## 2018-06-18 DIAGNOSIS — E662 Morbid (severe) obesity with alveolar hypoventilation: Secondary | ICD-10-CM | POA: Diagnosis not present

## 2018-06-18 DIAGNOSIS — E1169 Type 2 diabetes mellitus with other specified complication: Secondary | ICD-10-CM

## 2018-06-18 DIAGNOSIS — N182 Chronic kidney disease, stage 2 (mild): Secondary | ICD-10-CM

## 2018-06-21 ENCOUNTER — Other Ambulatory Visit: Payer: Self-pay

## 2018-06-28 ENCOUNTER — Other Ambulatory Visit: Payer: Self-pay | Admitting: Internal Medicine

## 2018-07-19 ENCOUNTER — Encounter: Payer: Self-pay | Admitting: Adult Health

## 2018-07-24 ENCOUNTER — Other Ambulatory Visit: Payer: Self-pay | Admitting: Internal Medicine

## 2018-08-05 ENCOUNTER — Encounter: Payer: Self-pay | Admitting: Cardiovascular Disease

## 2018-08-09 ENCOUNTER — Encounter: Payer: Self-pay | Admitting: Adult Health

## 2018-08-31 ENCOUNTER — Other Ambulatory Visit: Payer: Self-pay | Admitting: Internal Medicine

## 2018-09-14 ENCOUNTER — Telehealth: Payer: Self-pay | Admitting: Internal Medicine

## 2018-09-14 MED ORDER — INSULIN GLARGINE 100 UNIT/ML ~~LOC~~ SOLN: 50.0000 [IU] | Freq: Every day | SUBCUTANEOUS | 3 refills | Status: DC

## 2018-09-14 NOTE — Telephone Encounter (Signed)
RX sent

## 2018-09-14 NOTE — Telephone Encounter (Signed)
MEDICATION: insulin glargine (LANTUS) 100 UNIT/ML injection  PHARMACY:  [facility]   IS THIS A 90 DAY SUPPLY : YES  IS PATIENT OUT OF MEDICATION:   IF NOT; HOW MUCH IS LEFT: 2 weeks  LAST APPOINTMENT DATE: @3 /24/2020  NEXT APPOINTMENT DATE:@7 /20/2020  DO WE HAVE YOUR PERMISSION TO LEAVE A DETAILED MESSAGE:  OTHER COMMENTS:    **Let patient know to contact pharmacy at the end of the day to make sure medication is ready. **  ** Please notify patient to allow 48-72 hours to process**  **Encourage patient to contact the pharmacy for refills or they can request refills through [facility]**

## 2018-09-20 ENCOUNTER — Other Ambulatory Visit: Payer: Self-pay | Admitting: Internal Medicine

## 2018-09-20 ENCOUNTER — Ambulatory Visit (INDEPENDENT_AMBULATORY_CARE_PROVIDER_SITE_OTHER): Payer: 59 | Admitting: Internal Medicine

## 2018-09-20 ENCOUNTER — Other Ambulatory Visit: Payer: Self-pay

## 2018-09-20 ENCOUNTER — Encounter: Payer: Self-pay | Admitting: Internal Medicine

## 2018-09-20 VITALS — BP 140/72 | HR 68 | Ht 73.5 in | Wt 254.6 lb

## 2018-09-20 DIAGNOSIS — E669 Obesity, unspecified: Secondary | ICD-10-CM

## 2018-09-20 DIAGNOSIS — E139 Other specified diabetes mellitus without complications: Secondary | ICD-10-CM

## 2018-09-20 DIAGNOSIS — Z794 Long term (current) use of insulin: Secondary | ICD-10-CM | POA: Diagnosis not present

## 2018-09-20 DIAGNOSIS — E1165 Type 2 diabetes mellitus with hyperglycemia: Secondary | ICD-10-CM | POA: Diagnosis not present

## 2018-09-20 DIAGNOSIS — E782 Mixed hyperlipidemia: Secondary | ICD-10-CM

## 2018-09-20 DIAGNOSIS — Z20822 Contact with and (suspected) exposure to covid-19: Secondary | ICD-10-CM

## 2018-09-20 LAB — POCT GLYCOSYLATED HEMOGLOBIN (HGB A1C): Hemoglobin A1C: 6.9 % — AB (ref 4.0–5.6)

## 2018-09-20 NOTE — Patient Instructions (Addendum)
Please continue:  Metformin ER 1000 mg 2x a day twice a day (try 2000 mg with dinner)  Trulicity 1.5 mg weekly  Humalog:  10-20 units before meals  Lantus 50 units in a.m. (may need to add 15 units at bedtime if sugars remain high in am)  Try to increase the Humalog dose with lunch.  STOP MILK.  Move dinners earlier.  Please return in 4 months with your sugar log.

## 2018-09-20 NOTE — Progress Notes (Signed)
Patient ID: Harry [name], male   DOB: Mar 11, 1955, 63 y.o.   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 Last visit 3 months ago.  Sugars were higher at last visit after his wife passed away from cancer a month prior to our last appointment.  He is now working from home. In the last month sugars are higher.  Last hemoglobin A1c was: Lab Results  Component Value Date   HGBA1C 6.9 (H) 06/14/2018   HGBA1C 7.2 (A) 05/25/2018   HGBA1C 6.4 (A) 02/01/2018   He is on:  Metformin ER 1000 mg 2x a day twice a day  Trulicity 1.5 mg weekly  Humalog:  10-20 units before meals, occasionally 5-10 units at bedtime  Lantus 30 - 30 >> 50 units in a.m.  Pt checks his sugars twice a day - worse in last month: - am:  85-140 >> 78-150 >> 82-140 >>  95-145 >> 150-210, 235 - 2h after b'fast: 180-313 >> n/c >> 165 >> n/c - before lunch: (L: 25 units) >> 164-325 >> n/c >> 70-150 (15 units) - 2h after lunch: n/c >> 354, 362 >> 72 >> n/c - before dinner: 268 >> n/c (D: 25 units) >> n/c  >> 165-235 - 2h after dinner: 117, 169-440 >>> n/c  - bedtime: 85-130, 250-280 >> 120-200, 300 >> 97-160 >> 130-200 (dinner late) - nighttime: n/c >>> 66 >> n/c Lowest sugar was 30s  ... >> 87 >> 70; he has hypoglycemia awareness in the 60s. Highest sugar was in the 350 >> 300s.  Glucometer: one touch ultra 2  Pt's meals are: - Breakfast: Glucerna, banana - Lunch: eats out - salad with chicken; chicken burrito; sandwich; [employer] >> sandwich with either tomatoes or peanut butter and jelly + hold milk - Dinner: meat + veggie + starch - Snacks: potato chips, fruit, icecream No sodas.  -+ Mild CKD, last BUN/creatinine:  Lab Results  Component Value Date   BUN 16 06/14/2018   BUN 16 05/25/2018   CREATININE 0.97 06/14/2018    CREATININE 1.06 05/25/2018  On losartan. -+ HL; last set of lipids: Lab Results  Component Value Date   CHOL 126 06/14/2018   HDL 51 06/14/2018   LDLCALC 60 06/14/2018   TRIG 72 06/14/2018   CHOLHDL 2.5 06/14/2018  On Lipitor. - last eye exam was in 05/2017: No DR-Dr. Gershon Crane - no numbness and tingling in his feet.  He also has a history of HTN, GERD, OSA-on CPAP.  ROS: Constitutional: no weight gain/no weight loss, no fatigue, no subjective hyperthermia, no subjective hypothermia Eyes: no blurry vision, no xerophthalmia ENT: no sore throat, no nodules palpated in neck, no dysphagia, no odynophagia, no hoarseness Cardiovascular: no CP/no SOB/no palpitations/no leg swelling Respiratory: no cough/no SOB/no wheezing Gastrointestinal: no N/no V/no D/no C/no acid reflux Musculoskeletal: no muscle aches/no joint aches Skin: no rashes, no hair loss Neurological: no tremors/no numbness/no tingling/no dizziness  I reviewed pt's medications, allergies, PMH, social hx, family hx, and changes were documented in the history of present illness. Otherwise, unchanged from my initial visit note.  Past Medical History:  Diagnosis Date  . CKD (chronic kidney disease) stage 2, GFR 60-89 ml/min    secondary to DM  . Diabetes mellitus without complication ([name]) 3016   insulin requiring  . GERD (gastroesophageal  reflux disease)   . Hyperlipidemia   . Hypertension   . Hypokalemia   . Hypomagnesemia   . OSA on CPAP    10-12 years ago  . Vitamin D deficiency    Past Surgical History:  Procedure Laterality Date  . ANAL FISSURE REPAIR  2007  . VENTRAL HERNIA REPAIR  04/13/12   Dr. Johney Maine, ventral hernia repair w/ mesh   Social History   Social History  . Marital status: Married    Spouse name: N/A  . Number of children: 2   Occupational History  . estimator   Social History Main Topics  . Smoking status: Never Smoker  . Smokeless tobacco: Not on file  . Alcohol use 0.0 oz/week    2  - 5 Cans of beer per week   Current Outpatient Medications on File Prior to Visit  Medication Sig Dispense Refill  . aspirin 81 MG tablet Take 81 mg by mouth daily.    Marland Kitchen atorvastatin (LIPITOR) 40 MG tablet TAKE 1 TABLET DAILY FOR CHOLESTEROL 90 tablet 3  . bisoprolol-hydrochlorothiazide (ZIAC) 5-6.25 MG tablet TAKE 1 TABLET DAILY FOR BLOOD PRESSURE 90 tablet 4  . Cholecalciferol (VITAMIN D PO) Take 5,000 Units by mouth daily.     Marland Kitchen glucose 4 GM chewable tablet Chew 1 tablet (4 g total) by mouth as needed for low blood sugar. 30 tablet 12  . insulin glargine (LANTUS) 100 UNIT/ML injection Inject 0.5 mLs (50 Units total) into the skin daily. 30 mL 3  . insulin lispro (HUMALOG) 100 UNIT/ML injection INJECT 60 TO 80 UNITS UNDER THE SKIN PER DAY WITH MEALS DEPENDING ON MEALS (Patient taking differently: Inject 15-25 Units into the skin 3 (three) times daily with meals. ) 210 mL 4  . Insulin Syringe-Needle U-100 (BD INSULIN SYRINGE U/F) 31G X 5/16" 0.5 ML MISC Inject Insulin 2 x /day as directed 200 each 99  . losartan-hydrochlorothiazide (HYZAAR) 100-12.5 MG tablet Take 1 tablet Daily for BP 90 tablet 3  . Magnesium 500 MG TABS Take 1 tablet by mouth daily.    . metFORMIN (GLUCOPHAGE-XR) 500 MG 24 hr tablet TAKE 2 TABLETS TWICE A DAY WITH FOOD FOR DIABETES 360 tablet 4  . omeprazole (PRILOSEC) 20 MG capsule TAKE 1 CAPSULE DAILY FOR ACID REFLUX 90 capsule 3  . ONE TOUCH ULTRA TEST test strip USE 3 TO 4 TIMES A DAY FOR FLUCTUATING BLOOD SUGARS 400 each 3  . potassium chloride SA (K-DUR,KLOR-CON) 20 MEQ tablet TAKE 1 TABLET TWICE A DAY 180 tablet 4  . tadalafil (CIALIS) 20 MG tablet Take 1 tablet (20 mg total) by mouth daily as needed for erectile dysfunction. 60 tablet 3  . TRULICITY 1.5 ZO/1.0RU SOPN INJECT 1.5 MG UNDER THE SKIN ONCE A WEEK 6 mL 4   No current facility-administered medications on file prior to visit.    Allergies  Allergen Reactions  . Crestor [Rosuvastatin] Cough   Family  History  Problem Relation Age of Onset  . Kidney cancer Father        kidney  . Stroke Maternal Grandmother   . Dementia Paternal Grandmother        Early 1s  . Cancer Paternal Wynell Balloon of age or type  . Cancer Paternal Uncle     PE: BP 140/72 (BP Location: Left Arm, Patient Position: Sitting, Cuff Size: Normal)   Pulse 68   Ht 6' 1.5" (1.867 m)   Wt 254 lb 9.6  oz (115.5 kg)   SpO2 98%   BMI 33.13 kg/m  Wt Readings from Last 3 Encounters:  09/20/18 254 lb 9.6 oz (115.5 kg)  05/25/18 254 lb (115.2 kg)  05/25/18 255 lb (115.7 kg)   Constitutional: overweight, in NAD Eyes: PERRLA, EOMI, no exophthalmos ENT: moist mucous membranes, no thyromegaly, no cervical lymphadenopathy Cardiovascular: RRR, No MRG Respiratory: CTA B Gastrointestinal: abdomen soft, NT, ND, BS+ Musculoskeletal: no deformities, strength intact in all 4 Skin: moist, warm, no rashes Neurological: no tremor with outstretched hands, DTR normal in all 4  ASSESSMENT: 1. LADA, insulin-dependent, uncontrolled, with complications - CKD stage 2 - ED  - he is interested in an insulin pump  - he has an elliptical machine at home  Component     Latest Ref Rng & Units 07/11/2016  Hemoglobin A1C      6.5  Glutamic Acid Decarb Ab     <5 IU/mL 47 (H)  Pancreatic Islet Cell Antibody     <5 JDF Units <5  C-Peptide     0.80 - 3.85 ng/mL 0.37 (L)  Glucose, Fasting     65 - 99 mg/dL 104 (H)   Elevated GAD antibodies and decreased insulin production. 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 HL  3.  Obesity class I BMI Classification:  < 18.5 underweight   18.5-24.9 normal weight   25.0-29.9 overweight   30.0-34.9 class I obesity   35.0-39.9 class II obesity   ? 40.0 class III obesity   PLAN:  1. Patient with longstanding, previously uncontrolled, insulin-dependent diabetes (LADA), with improved  sugars in the past after he reduce snacking.  He even developed low blood sugars in the 50s and 60s and we had to decrease his Lantus dose.  He was forgetting insulin doses in the past and also Trulicity no we discussed about moving this on Sunday.  Since then, he is doing better with remembering to take the medication.  At last visit, he had major stress in his life with his wife passing away after a long battle with cancer.  The sugars increase significantly, to 300s and higher and then slowly started to improve in the 2 weeks prior to our last appointment.  Since they were improving, I did not make changes in his regimen.  HbA1c level was still better, at 6.9%. -At this visit, sugars are higher than before, even in the morning.  Upon questioning, his dinners are now later and he did see better sugars when he was eating between 6 and 7 PM.  I discussed with him about moving dinners earlier.  Also, his sugars after lunch are much higher than before lunch so we discussed that he needs to increase the insulin with this meal.  I advised him how to do a bolus validation.  If sugars remain high after these measures, we discussed that he may need to a low dose of Lantus at night or to move the entire dose of metformin with dinner. -I also advised him to stop milk -He is interested in an insulin pump, but did not check with his insurance to see which one is covered. - I advised him to  to:  Patient Instructions  Please continue:  Metformin ER 1000 mg 2x a day twice a day (try 2000 mg with dinner)  Trulicity 1.5 mg weekly  Humalog:  10-20 units before meals  Lantus  50 units in a.m. (may need to add 15 units at bedtime if sugars remain high in am)  Try to increase the Humalog dose with lunch.  STOP MILK.  Move dinners earlier.  Please return in 4 months with your sugar log.     - we checked his HbA1c: 6.9% (stable) - advised to check sugars at different times of the day - 3-4x a day, rotating check  times - advised for yearly eye exams >> he is not UTD - return to clinic in 4 months    2. HL - Reviewed latest lipid panel from 3 months ago and they were excellent Lab Results  Component Value Date   CHOL 126 06/14/2018   HDL 51 06/14/2018   LDLCALC 60 06/14/2018   TRIG 72 06/14/2018   CHOLHDL 2.5 06/14/2018  - Continues the Lipitor without side effects.  3.  Obesity class I -Continue Trulicity which should also help with weight loss -Lost 5 pounds before last visit, now weight stable   [name], MD PhD [facility] Endocr[facility]

## 2018-09-20 NOTE — Progress Notes (Signed)
Complete Physical  Assessment and Plan:  Essential hypertension - continue medications, DASH diet, exercise and monitor at home. Call if greater than 130/80.  - CBC with Differential/Platelet - CMP/GFR - TSH - Urinalysis, Routine w reflex microscopic - Microalbumin / creatinine urine ratio  PVCs Defer EKG as just had in March, followed by cardiology  T2_IDDM w/Stage 2 CKD (GFR 76 ml/min) Discussed general issues about diabetes pathophysiology and management., Educational material distributed., Suggested low cholesterol diet., Encouraged aerobic exercise., Discussed foot care., Reminded to get yearly retinal exam. Followed by [name]   CKD (chronic kidney disease) stage 2, GFR 60-89 ml/min Increase fluids, avoid NSAIDS, monitor sugars, will monitor - CMP WITH GFR  Obesity (BMI 33) Obesity with co morbidities- long discussion about weight loss, diet, and exercise   Hyperlipidemia -continue medications, check lipids, decrease fatty foods, increase activity.  - Lipid panel  Vitamin D deficiency - Vit D  25 hydroxy (rtn osteoporosis monitoring)  Prostate cancer screening - PSA   Gastroesophageal reflux disease with esophagitis Continue PPI/H2 blocker, diet discussed   Hypoventilation syndrome Sleep apnea- continue CPAP, weight loss advised.    Ventral hernia without obstruction or gangrene monitor  BPH (benign prostatic hypertrophy) Symptoms stable, check PSA, continue cialis   Erectile dysfunction associated with type 2 diabetes mellitus Cialis PRN   Ascending aorta aneurysm Control blood pressure, cholesterol, glucose, increase exercise.  Continue cardio follow up    Discussed med's effects and SE's. Screening labs and tests as requested with regular follow-up as recommended. Over 40 minutes of exam, counseling, chart review and critical decision making was performed  Future Appointments  Date Time Provider [place]  01/20/2019  4:00 PM [name], MD [id number] None  09/29/2019 10:00 AM Liane Comber, NP [id number] None     HPI 63 y.o. male patient presents for a complete physical. has Ventral hernia - periumbilical 1-6XW; Obesity (BMI 30.0-34.9); Hyperlipidemia, mixed; Essential hypertension; Vitamin D deficiency; GERD (gastroesophageal reflux disease); CKD stage 2 due to type 2 diabetes mellitus ([employer]); Obesity hypoventilation syndrome (HCC); BPH (benign prostatic hyperplasia); Erectile dysfunction associated with type 2 diabetes mellitus ([place]); PVC's (premature ventricular contractions); Aneurysm, ascending aorta (HCC); LADA (latent autoimmune diabetes in adults), managed as type 2 ([place]); Family history of cerebrovascular disease; and OSA on CPAP on their problem list.   Wife passed away from cancer in 05-23-2018 from breast cancer recurrence. He has 2 children, 2 grandkids. He works from office doing estimates.   He has OSA on CPAP, also with obesity hypoventilation syndrome and followed by neurology; he endorses 100% compliance with restorative sleep.   BMI is Body mass index is 33.19 kg/m., he does somewhat watch his diet, light breakfast with oatmeal, English muffin, salad and chicken for lunch, etc; he admits to not exercising much.  Wt Readings from Last 3 Encounters:  09/21/18 255 lb (115.7 kg)  09/20/18 254 lb 9.6 oz (115.5 kg)  05/25/18 254 lb (115.2 kg)   He has known ascending aorta dilation, It was 4.2 cm in 2016, 4.1 cm per recent ECHO 11/2017 by cardiology.  He had repeat stress test and ECHO in 11/2017 which were essentially normal by Dr. Sallyanne Kuster.  His blood pressure has been controlled at home, today their BP is BP: 126/76 He does not workout. He denies chest pain, shortness of breath, dizziness.   He is on cholesterol medication (atorvastatin 40 mg daily) and denies myalgias. His cholesterol is at goal. The cholesterol last visit was:   Lab  Results  Component Value Date   CHOL 126 06/14/2018   HDL 51  06/14/2018   LDLCALC 60 06/14/2018   TRIG 72 06/14/2018   CHOLHDL 2.5 06/14/2018   He has been working on diet for LADA managed as T2 diabetes, followed by [name], he is on bASA, he is on ACE/ARB and denies foot ulcerations, increased appetite, nausea, paresthesia of the feet, polydipsia, polyuria, visual disturbances, vomiting and weight loss. He checks glucose 2-3 times daily, fasting runs 80-120. He does endorse 2 episodes of symptomatic hypoglycemia (in the 50s); he reports weakness, disorientation with this, is able to recognize and correct. He is treated by:    Metformin ER 1000 mg 2x a day twice a day  Trulicity 1.5 mg weekly  Humalog: 10 to 20 units before a meal depending on the size of the meal  Lantus 50 units in a.m.  Last A1C in the office was:  Lab Results  Component Value Date   HGBA1C 6.9 (A) 09/20/2018   Last GFR: Lab Results  Component Value Date   GFRNONAA 82 06/14/2018    Patient is on Vitamin D supplement, taking 5000 IU irregularly Lab Results  Component Value Date   VD25OH 40 06/14/2018     Last PSA was: Lab Results  Component Value Date   PSA 3.2 08/04/2017   He endorses slow stream occasionally if he gets up late at night to urinate; typically 1-2 times in the evening.    Current Medications:  Current Outpatient Medications on File Prior to Visit  Medication Sig Dispense Refill  . aspirin 81 MG tablet Take 81 mg by mouth daily.    Marland Kitchen atorvastatin (LIPITOR) 40 MG tablet TAKE 1 TABLET DAILY FOR CHOLESTEROL 90 tablet 3  . bisoprolol-hydrochlorothiazide (ZIAC) 5-6.25 MG tablet TAKE 1 TABLET DAILY FOR BLOOD PRESSURE 90 tablet 4  . Cholecalciferol (VITAMIN D PO) Take 5,000 Units by mouth daily.     Marland Kitchen glucose 4 GM chewable tablet Chew 1 tablet (4 g total) by mouth as needed for low blood sugar. 30 tablet 12  . insulin glargine (LANTUS) 100 UNIT/ML injection Inject 0.5 mLs (50 Units total) into the skin daily. 30 mL 3  . insulin lispro (HUMALOG)  100 UNIT/ML injection INJECT 60 TO 80 UNITS UNDER THE SKIN PER DAY WITH MEALS DEPENDING ON MEALS (Patient taking differently: Inject 15-25 Units into the skin 3 (three) times daily with meals. ) 210 mL 4  . Insulin Syringe-Needle U-100 (BD INSULIN SYRINGE U/F) 31G X 5/16" 0.5 ML MISC Inject Insulin 2 x /day as directed 200 each 99  . losartan-hydrochlorothiazide (HYZAAR) 100-12.5 MG tablet Take 1 tablet Daily for BP 90 tablet 3  . Magnesium 500 MG TABS Take 1 tablet by mouth daily.    . metFORMIN (GLUCOPHAGE-XR) 500 MG 24 hr tablet TAKE 2 TABLETS TWICE A DAY WITH FOOD FOR DIABETES 360 tablet 4  . omeprazole (PRILOSEC) 20 MG capsule TAKE 1 CAPSULE DAILY FOR ACID REFLUX 90 capsule 3  . ONE TOUCH ULTRA TEST test strip USE 3 TO 4 TIMES A DAY FOR FLUCTUATING BLOOD SUGARS 400 each 3  . potassium chloride SA (K-DUR,KLOR-CON) 20 MEQ tablet TAKE 1 TABLET TWICE A DAY 180 tablet 4  . tadalafil (CIALIS) 20 MG tablet Take 1 tablet (20 mg total) by mouth daily as needed for erectile dysfunction. 60 tablet 3  . TRULICITY 1.5 EH/2.0NO SOPN INJECT 1.5 MG UNDER THE SKIN ONCE A WEEK 6 mL 4   No current  facility-administered medications on file prior to visit.    Allergies:  Allergies  Allergen Reactions  . Crestor [Rosuvastatin] Cough   Health Maintenance:  Immunization History  Administered Date(s) Administered  . Influenza Inj Mdck Quad With Preservative 12/19/2016  . Influenza Split 11/28/2013, 12/06/2014  . Influenza-Unspecified 12/19/2011, 01/04/2013, 01/01/2016, 12/23/2017  . Pneumococcal Polysaccharide-23 03/03/2006  . Td 03/03/2008, 03/16/2018  . Zoster 02/07/2011   Tetanus: 03/2018 Pneumovax: 2008 Prevnar 13: DUE age 62 Flu vaccine: 2019 Zostavax: 2012  DEXA: N/A Colonoscopy: 12/2013 (Dr. Cristina Gong) due 2025 Echo 11/2017 Stress test 11/2017 EGD: N/A  Eye Exam: Dr. Gershon Crane q yearly, last 05/01/2017 - report verified and abstracted Dentist: Dr. Tyron Russell, last visit 09/2018  Patient  Care Team: Unk Pinto, MD as PCP - General (Internal Medicine) Rutherford Guys, MD as Consulting Physician (Ophthalmology) Ronald Lobo, MD as Consulting Physician (Gastroenterology) Larey Dresser, MD as Consulting Physician (Cardiology)  Medical History:  has Ventral hernia - periumbilical 9-3AT; Obesity (BMI 30.0-34.9); Hyperlipidemia, mixed; Essential hypertension; Vitamin D deficiency; GERD (gastroesophageal reflux disease); CKD stage 2 due to type 2 diabetes mellitus ([place]); Obesity hypoventilation syndrome (HCC); BPH (benign prostatic hyperplasia); Erectile dysfunction associated with type 2 diabetes mellitus ([place]); PVC's (premature ventricular contractions); Aneurysm, ascending aorta (HCC); LADA (latent autoimmune diabetes in adults), managed as type 2 ([place]); Family history of cerebrovascular disease; and OSA on CPAP on their problem list. Surgical History:  He  has a past surgical history that includes Anal fissure repair (2007); Ventral hernia repair (04/13/12); and Tonsillectomy (Bilateral). Family History:  His family history includes Cancer in his paternal grandfather; Cancer (age of onset: 14) in his paternal uncle; Dementia in his paternal grandmother; Kidney cancer (age of onset: 32) in his father; Stroke in his maternal grandmother. Social History:   reports that he has never smoked. He has never used smokeless tobacco. He reports current alcohol use of about 2.0 - 5.0 standard drinks of alcohol per week. He reports that he does not use drugs. Review of Systems:  Review of Systems  Constitutional: Negative for malaise/fatigue and weight loss.  HENT: Negative for hearing loss and tinnitus.   Eyes: Negative for blurred vision and double vision.  Respiratory: Negative for cough, sputum production, shortness of breath and wheezing.   Cardiovascular: Negative for chest pain, palpitations, orthopnea, claudication, leg swelling and PND.  Gastrointestinal: Negative for abdominal  pain, blood in stool, constipation, diarrhea, heartburn, melena, nausea and vomiting.  Genitourinary: Negative.   Musculoskeletal: Negative for falls, joint pain and myalgias.  Skin: Negative for rash.  Neurological: Negative for dizziness, tingling, sensory change, weakness and headaches.  Endo/Heme/Allergies: Negative for polydipsia.  Psychiatric/Behavioral: Negative.  Negative for depression, memory loss, substance abuse and suicidal ideas. The patient is not nervous/anxious and does not have insomnia.   All other systems reviewed and are negative.   Physical Exam: Estimated body mass index is 33.19 kg/m as calculated from the following:   Height as of this encounter: 6' 1.5" (1.867 m).   Weight as of this encounter: 255 lb (115.7 kg). BP 126/76   Pulse 70   Temp (!) 97.5 F (36.4 C)   Ht 6' 1.5" (1.867 m)   Wt 255 lb (115.7 kg)   SpO2 98%   BMI 33.19 kg/m  General Appearance: Well nourished, in no apparent distress.  Eyes: PERRLA, EOMs, conjunctiva no swelling or erythema, normal fundi and vessels.  Sinuses: No Frontal/maxillary tenderness  ENT/Mouth: Ext aud canals clear, normal light reflex with TMs without  erythema, bulging. Good dentition. No erythema, swelling, or exudate on post pharynx. Tonsils not swollen or erythematous. Hearing normal.  Neck: Supple, thyroid normal. No bruits  Respiratory: Respiratory effort normal, BS equal bilaterally without rales, rhonchi, wheezing or stridor.  Cardio: RRR without murmurs, rubs or gallops. Brisk peripheral pulses without edema.  Chest: symmetric, with normal excursions and percussion.  Abdomen: Soft, nontender, no guarding, rebound, hernias, masses, or organomegaly.  Lymphatics: Non tender without lymphadenopathy.  Genitourinary: declines Musculoskeletal: Full ROM all peripheral extremities,5/5 strength, and normal gait.  Skin: Warm, dry without rashes, lesions, ecchymosis. Neuro: Cranial nerves intact, reflexes equal  bilaterally. Normal muscle tone, no cerebellar symptoms. Sensation intact.  Psych: Awake and oriented X 3, normal affect, Insight and Judgment appropriate.   EKG: Just had in 05/2018 reviewed; IRBBB, No ST changes, defer today  Harry [name] 10:27 AM [facility]

## 2018-09-21 ENCOUNTER — Other Ambulatory Visit: Payer: Self-pay

## 2018-09-21 ENCOUNTER — Other Ambulatory Visit: Payer: Self-pay | Admitting: Adult Health

## 2018-09-21 ENCOUNTER — Encounter: Payer: Self-pay | Admitting: Adult Health

## 2018-09-21 ENCOUNTER — Ambulatory Visit (INDEPENDENT_AMBULATORY_CARE_PROVIDER_SITE_OTHER): Payer: 59 | Admitting: Adult Health

## 2018-09-21 VITALS — BP 126/76 | HR 70 | Temp 97.5°F | Ht 73.5 in | Wt 255.0 lb

## 2018-09-21 DIAGNOSIS — Z79899 Other long term (current) drug therapy: Secondary | ICD-10-CM | POA: Diagnosis not present

## 2018-09-21 DIAGNOSIS — E139 Other specified diabetes mellitus without complications: Secondary | ICD-10-CM

## 2018-09-21 DIAGNOSIS — K2100 Gastro-esophageal reflux disease with esophagitis, without bleeding: Secondary | ICD-10-CM

## 2018-09-21 DIAGNOSIS — Z114 Encounter for screening for human immunodeficiency virus [HIV]: Secondary | ICD-10-CM | POA: Diagnosis not present

## 2018-09-21 DIAGNOSIS — Z13220 Encounter for screening for lipoid disorders: Secondary | ICD-10-CM

## 2018-09-21 DIAGNOSIS — Z0001 Encounter for general adult medical examination with abnormal findings: Secondary | ICD-10-CM

## 2018-09-21 DIAGNOSIS — Z125 Encounter for screening for malignant neoplasm of prostate: Secondary | ICD-10-CM

## 2018-09-21 DIAGNOSIS — N401 Enlarged prostate with lower urinary tract symptoms: Secondary | ICD-10-CM

## 2018-09-21 DIAGNOSIS — Z130 Encounter for screening for diseases of the blood and blood-forming organs and certain disorders involving the immune mechanism: Secondary | ICD-10-CM

## 2018-09-21 DIAGNOSIS — E1122 Type 2 diabetes mellitus with diabetic chronic kidney disease: Secondary | ICD-10-CM

## 2018-09-21 DIAGNOSIS — Z1389 Encounter for screening for other disorder: Secondary | ICD-10-CM | POA: Diagnosis not present

## 2018-09-21 DIAGNOSIS — K439 Ventral hernia without obstruction or gangrene: Secondary | ICD-10-CM

## 2018-09-21 DIAGNOSIS — Z7251 High risk heterosexual behavior: Secondary | ICD-10-CM | POA: Diagnosis not present

## 2018-09-21 DIAGNOSIS — R350 Frequency of micturition: Secondary | ICD-10-CM

## 2018-09-21 DIAGNOSIS — E669 Obesity, unspecified: Secondary | ICD-10-CM

## 2018-09-21 DIAGNOSIS — Z0000 Encounter for general adult medical examination without abnormal findings: Secondary | ICD-10-CM

## 2018-09-21 DIAGNOSIS — E782 Mixed hyperlipidemia: Secondary | ICD-10-CM

## 2018-09-21 DIAGNOSIS — E559 Vitamin D deficiency, unspecified: Secondary | ICD-10-CM | POA: Diagnosis not present

## 2018-09-21 DIAGNOSIS — I493 Ventricular premature depolarization: Secondary | ICD-10-CM

## 2018-09-21 DIAGNOSIS — E662 Morbid (severe) obesity with alveolar hypoventilation: Secondary | ICD-10-CM

## 2018-09-21 DIAGNOSIS — N182 Chronic kidney disease, stage 2 (mild): Secondary | ICD-10-CM

## 2018-09-21 DIAGNOSIS — G4733 Obstructive sleep apnea (adult) (pediatric): Secondary | ICD-10-CM

## 2018-09-21 DIAGNOSIS — D649 Anemia, unspecified: Secondary | ICD-10-CM

## 2018-09-21 DIAGNOSIS — R5383 Other fatigue: Secondary | ICD-10-CM

## 2018-09-21 DIAGNOSIS — I7121 Aneurysm of the ascending aorta, without rupture: Secondary | ICD-10-CM

## 2018-09-21 DIAGNOSIS — E66811 Obesity, class 1: Secondary | ICD-10-CM

## 2018-09-21 DIAGNOSIS — E1169 Type 2 diabetes mellitus with other specified complication: Secondary | ICD-10-CM

## 2018-09-21 DIAGNOSIS — I10 Essential (primary) hypertension: Secondary | ICD-10-CM

## 2018-09-21 DIAGNOSIS — I712 Thoracic aortic aneurysm, without rupture: Secondary | ICD-10-CM

## 2018-09-21 DIAGNOSIS — Z8249 Family history of ischemic heart disease and other diseases of the circulatory system: Secondary | ICD-10-CM

## 2018-09-21 NOTE — Addendum Note (Signed)
Addended by: Izora Ribas on: 09/21/2018 11:05 AM   Modules accepted: Orders

## 2018-09-21 NOTE — Patient Instructions (Signed)
Harry [name] , Thank you for taking time to come for your Annual Wellness Visit. I appreciate your ongoing commitment to your health goals. Please review the following plan we discussed and let me know if I can assist you in the future.   These are the goals we discussed: Goals    . Exercise 150 min/wk Moderate Activity    . Weight (lb) < 230 lb (104.3 kg)       This is a list of the screening recommended for you and due dates:  Health Maintenance  Topic Date Due  . HIV Screening  [birth year]/20/1969  . Eye exam for diabetics  05/02/2018  . Complete foot exam   08/05/2018  . Flu Shot  10/02/2018  . Hemoglobin A1C  03/23/2019  . Colon Cancer Screening  12/22/2023  . Tetanus Vaccine  03/16/2028  .  Hepatitis C: One time screening is recommended by Center for Disease Control  (CDC) for  adults born from [birth year] through 1966.   Completed    Please schedule a diabetic eye exam and have them forward the results    Know what a healthy weight is for you (roughly BMI <25) and aim to maintain this  Aim for 7+ servings of fruits and vegetables daily  65-80+ fluid ounces of water or unsweet tea for healthy kidneys  Limit to max 1 drink of alcohol per day; avoid smoking/tobacco  Limit animal fats in diet for cholesterol and heart health - choose grass fed whenever available  Avoid highly processed foods, and foods high in saturated/trans fats  Aim for low stress - take time to unwind and care for your mental health  Aim for 150 min of moderate intensity exercise weekly for heart health, and weights twice weekly for bone health  Aim for 7-9 hours of sleep daily    Drink 1/2 your body weight in fluid ounces of water daily; drink a tall glass of water 30 min before meals  Don't eat until you're stuffed- listen to your stomach and eat until you are 80% full   Try eating off of a salad plate; wait 10 min after finishing before going back for seconds  Start by eating the vegetables on your plate;  aim for 50% of your meals to be fruits or vegetables  Then eat your protein - lean meats (grass fed if possible), fish, beans, nuts in moderation  Eat your carbs/starch last ONLY if you still are hungry. If you can, stop before finishing it all  Avoid sugar and flour - the closer it looks to it's original form in nature, typically the better it is for you  Splurge in moderation - "assign" days when you get to splurge and have the "bad stuff" - I like to follow a 80% - 20% plan- "good" choices 80 % of the time, "bad" choices in moderation 20% of the time  Simple equation is: Calories out > calories in = weight loss - even if you eat the bad stuff, if you limit portions, you will still lose weight      When it comes to diets, agreement about the perfect plan isn't easy to find, even among the experts. Experts at the [employer] developed an idea known as the Healthy Eating Plate. Just imagine a plate divided into logical, healthy portions.  The emphasis is on diet quality:  Load up on vegetables and fruits - one-half of your plate: Aim for color and variety, and remember that  potatoes don't count.  Go for whole grains - one-quarter of your plate: Whole wheat, barley, wheat berries, quinoa, oats, brown rice, and foods made with them. If you want pasta, go with whole wheat pasta.  Protein power - one-quarter of your plate: Fish, chicken, beans, and nuts are all healthy, versatile protein sources. Limit red meat.  The diet, however, does go beyond the plate, offering a few other suggestions.  Use healthy plant oils, such as olive, canola, soy, corn, sunflower and peanut. Check the labels, and avoid partially hydrogenated oil, which have unhealthy trans fats.  If you're thirsty, drink water. Coffee and tea are good in moderation, but skip sugary drinks and limit milk and dairy products to one or two daily servings.  The type of carbohydrate in the diet is more important  than the amount. Some sources of carbohydrates, such as vegetables, fruits, whole grains, and beans-are healthier than others.  Finally, stay active.

## 2018-09-22 ENCOUNTER — Other Ambulatory Visit: Payer: Self-pay | Admitting: Adult Health

## 2018-09-22 DIAGNOSIS — R972 Elevated prostate specific antigen [PSA]: Secondary | ICD-10-CM

## 2018-09-22 HISTORY — DX: Elevated prostate specific antigen (PSA): R97.20

## 2018-09-22 LAB — COMPLETE METABOLIC PANEL WITH GFR
AG Ratio: 1.7 (calc) (ref 1.0–2.5)
ALT: 20 U/L (ref 9–46)
AST: 20 U/L (ref 10–35)
Albumin: 4 g/dL (ref 3.6–5.1)
Alkaline phosphatase (APISO): 72 U/L (ref 35–144)
BUN: 19 mg/dL (ref 7–25)
CO2: 30 mmol/L (ref 20–32)
Calcium: 9.3 mg/dL (ref 8.6–10.3)
Chloride: 104 mmol/L (ref 98–110)
Creat: 0.86 mg/dL (ref 0.70–1.25)
GFR, Est African American: 106 mL/min/{1.73_m2} (ref >=60)
GFR, Est Non African American: 92 mL/min/{1.73_m2} (ref >=60)
Globulin: 2.3 g/dL (calc) (ref 1.9–3.7)
Glucose, Bld: 48 mg/dL — ABNORMAL LOW (ref 65–99)
Potassium: 3.5 mmol/L (ref 3.5–5.3)
Sodium: 142 mmol/L (ref 135–146)
Total Bilirubin: 0.9 mg/dL (ref 0.2–1.2)
Total Protein: 6.3 g/dL (ref 6.1–8.1)

## 2018-09-22 LAB — CBC WITH DIFFERENTIAL/PLATELET
Absolute Monocytes: 556 cells/uL (ref 200–950)
Basophils Absolute: 107 cells/uL (ref 0–200)
Basophils Relative: 1.6 %
Eosinophils Absolute: 147 cells/uL (ref 15–500)
Eosinophils Relative: 2.2 %
HCT: 46.8 % (ref 38.5–50.0)
Hemoglobin: 15.4 g/dL (ref 13.2–17.1)
Lymphs Abs: 1554 cells/uL (ref 850–3900)
MCH: 28.2 pg (ref 27.0–33.0)
MCHC: 32.9 g/dL (ref 32.0–36.0)
MCV: 85.6 fL (ref 80.0–100.0)
MPV: 10.7 fL (ref 7.5–12.5)
Monocytes Relative: 8.3 %
Neutro Abs: 4335 cells/uL (ref 1500–7800)
Neutrophils Relative %: 64.7 %
Platelets: 213 10*3/uL (ref 140–400)
RBC: 5.47 10*6/uL (ref 4.20–5.80)
RDW: 13.1 % (ref 11.0–15.0)
Total Lymphocyte: 23.2 %
WBC: 6.7 10*3/uL (ref 3.8–10.8)

## 2018-09-22 LAB — MICROALBUMIN / CREATININE URINE RATIO
Creatinine, Urine: 163 mg/dL (ref 20–320)
Microalb Creat Ratio: 2 mcg/mg creat (ref <30)
Microalb, Ur: 0.4 mg/dL

## 2018-09-22 LAB — URINALYSIS, ROUTINE W REFLEX MICROSCOPIC
Bilirubin Urine: NEGATIVE
Hgb urine dipstick: NEGATIVE
Ketones, ur: NEGATIVE
Leukocytes,Ua: NEGATIVE
Nitrite: NEGATIVE
Protein, ur: NEGATIVE
Specific Gravity, Urine: 1.025 (ref 1.001–1.03)
pH: <=5 (ref 5.0–8.0)

## 2018-09-22 LAB — C. TRACHOMATIS/N. GONORRHOEAE RNA
C. trachomatis RNA, TMA: NOT DETECTED
N. gonorrhoeae RNA, TMA: NOT DETECTED

## 2018-09-22 LAB — VITAMIN D 25 HYDROXY (VIT D DEFICIENCY, FRACTURES): Vit D, 25-Hydroxy: 36 ng/mL (ref 30–100)

## 2018-09-22 LAB — HIV ANTIBODY (ROUTINE TESTING W REFLEX): HIV 1&2 Ab, 4th Generation: NONREACTIVE

## 2018-09-22 LAB — PSA: PSA: 4.1 ng/mL — ABNORMAL HIGH (ref <=4.0)

## 2018-09-22 LAB — RPR: RPR Ser Ql: NONREACTIVE

## 2018-09-22 LAB — LIPID PANEL
Cholesterol: 141 mg/dL (ref <200)
HDL: 48 mg/dL (ref >=40)
LDL Cholesterol (Calc): 69 mg/dL (calc)
Non-HDL Cholesterol (Calc): 93 mg/dL (calc) (ref <130)
Total CHOL/HDL Ratio: 2.9 (calc) (ref <5.0)
Triglycerides: 164 mg/dL — ABNORMAL HIGH (ref <150)

## 2018-09-22 LAB — IRON, TOTAL/TOTAL IRON BINDING CAP
%SAT: 41 % (calc) (ref 20–48)
Iron: 123 ug/dL (ref 50–180)
TIBC: 299 mcg/dL (calc) (ref 250–425)

## 2018-09-22 LAB — MAGNESIUM: Magnesium: 1.8 mg/dL (ref 1.5–2.5)

## 2018-09-22 LAB — NOVEL CORONAVIRUS, NAA: SARS-CoV-2, NAA: NOT DETECTED

## 2018-09-22 LAB — TSH: TSH: 1.66 mIU/L (ref 0.40–4.50)

## 2018-09-22 LAB — VITAMIN B12: Vitamin B-12: 342 pg/mL (ref 200–1100)

## 2018-09-22 MED ORDER — VITAMIN D 125 MCG (5000 UT) PO CAPS: 10000.0000 [IU] | ORAL_CAPSULE | Freq: Every day | ORAL | 0 refills | Status: DC

## 2018-09-23 ENCOUNTER — Other Ambulatory Visit: Payer: Self-pay | Admitting: Adult Health

## 2018-09-23 DIAGNOSIS — N401 Enlarged prostate with lower urinary tract symptoms: Secondary | ICD-10-CM

## 2018-09-23 DIAGNOSIS — R972 Elevated prostate specific antigen [PSA]: Secondary | ICD-10-CM

## 2018-09-23 DIAGNOSIS — R351 Nocturia: Secondary | ICD-10-CM

## 2018-09-28 ENCOUNTER — Encounter: Payer: Self-pay | Admitting: Physician Assistant

## 2018-09-28 ENCOUNTER — Other Ambulatory Visit: Payer: Self-pay

## 2018-09-28 ENCOUNTER — Ambulatory Visit: Payer: 59 | Admitting: Physician Assistant

## 2018-09-28 VITALS — BP 120/70 | HR 64 | Temp 97.9°F | Ht 73.0 in | Wt 255.0 lb

## 2018-09-28 DIAGNOSIS — E1122 Type 2 diabetes mellitus with diabetic chronic kidney disease: Secondary | ICD-10-CM

## 2018-09-28 DIAGNOSIS — N182 Chronic kidney disease, stage 2 (mild): Secondary | ICD-10-CM

## 2018-09-28 DIAGNOSIS — R369 Urethral discharge, unspecified: Secondary | ICD-10-CM

## 2018-09-28 DIAGNOSIS — R300 Dysuria: Secondary | ICD-10-CM

## 2018-09-28 DIAGNOSIS — R972 Elevated prostate specific antigen [PSA]: Secondary | ICD-10-CM

## 2018-09-28 DIAGNOSIS — N401 Enlarged prostate with lower urinary tract symptoms: Secondary | ICD-10-CM | POA: Diagnosis not present

## 2018-09-28 DIAGNOSIS — N481 Balanitis: Secondary | ICD-10-CM

## 2018-09-28 DIAGNOSIS — R351 Nocturia: Secondary | ICD-10-CM

## 2018-09-28 MED ORDER — ALFUZOSIN HCL ER 10 MG PO TB24: 10.0000 mg | ORAL_TABLET | Freq: Every day | ORAL | 1 refills | Status: DC

## 2018-09-28 MED ORDER — FLUCONAZOLE 150 MG PO TABS: 150.0000 mg | ORAL_TABLET | Freq: Every day | ORAL | 3 refills | Status: DC

## 2018-09-28 MED ORDER — NYSTATIN 100000 UNIT/GM EX CREA: 1.0000 "application " | TOPICAL_CREAM | Freq: Two times a day (BID) | CUTANEOUS | 1 refills | Status: DC

## 2018-09-28 NOTE — Patient Instructions (Addendum)
Balanitis  Balanitis is swelling and irritation (inflammation) of the head of the penis (glans penis). The condition may also cause inflammation of the skin around the glans penis (foreskin) in men who have not been circumcised. It may develop because of an infection or another medical condition. Balanitis occurs most often among men who have not had their foreskin removed (uncircumcised men). Balanitis sometimes causes scarring of the penis or foreskin, which can require surgery. Untreated balanitis can increase the risk of penile cancer. What are the causes? Common causes of this condition include:  Poor personal hygiene, especially in uncircumcised men. Not cleaning the glans penis and foreskin well can result in buildup of bacteria, viruses, and yeast, which can lead to infection and inflammation.  Irritation and lack of air flow due to fluid (smegma) that can build up on the glans penis. Other causes include:  Chemical irritation from products such as soaps or shower gels (especially those that have fragrance), condoms, personal lubricants, petroleum jelly, spermicides, or fabric softeners.  Skin conditions, such as eczema, dermatitis, and psoriasis.  Allergies to medicines, such as tetracycline and sulfa drugs.  Certain medical conditions, including liver cirrhosis, congestive heart failure, diabetes, and kidney disease.  Infections, such as candidiasis, HPV (human papillomavirus), herpes simplex, gonorrhea, and syphilis.  Severe obesity. What increases the risk? The following factors may make you more likely to develop this condition:  Having diabetes. This is the most common risk factor.  Having a tight foreskin that is difficult to pull back (retract) past the glans.  Having sexual intercourse without using a condom. What are the signs or symptoms? Symptoms of this condition include:  Discharge from under the foreskin.  A bad smell.  Pain or difficulty retracting the  foreskin.  Tenderness, redness, and swelling of the glans.  A rash or sores on the glans or foreskin.  Itchiness.  Inability to get an erection due to pain.  Difficulty urinating.  Scarring of the penis or foreskin, in some cases. How is this diagnosed? This condition may be diagnosed based on:  A physical exam.  Testing a swab of discharge to check for bacterial or fungal infection.  Blood tests: ? To check for viruses that can cause balanitis. ? To check your blood sugar (glucose) level. High blood glucose could be a sign of diabetes, which can cause balanitis. How is this treated? Treatment for balanitis depends on the cause. Treatment may include:  Improving personal hygiene. Your health care provider may recommend sitting in a bath of warm water that is deep enough to cover your hips and buttocks (sitz bath).  Medicines such as: ? Creams or ointments to reduce swelling (steroids) or to treat an infection. ? Antibiotic medicine. ? Antifungal medicine.  Surgery to remove or cut the foreskin (circumcision). This may be done if you have scarring on the foreskin that makes it difficult to retract.  Controlling other medical problems that may be causing your condition or making it worse. Follow these instructions at home:  Do not have sex until the condition clears up, or until your health care provider approves.  Keep your penis clean and dry. Take sitz baths as recommended by your health care provider.  Avoid products that irritate your skin or make symptoms worse, such as soaps and shower gels that have fragrance.  Take over-the-counter and prescription medicines only as told by your health care provider. ? If you were prescribed an antibiotic medicine or a cream or ointment, use it as   told by your health care provider. Do not stop using your medicine, cream, or ointment even if you start to feel better. ? Do not drive or use heavy machinery while taking prescription  pain medicine. Contact a health care provider if:  Your symptoms get worse or do not improve with home care.  You develop chills or a fever.  You have trouble urinating.  You cannot retract your foreskin. Get help right away if:  You develop severe pain.  You are unable to urinate. Summary  Balanitis is inflammation of the head of the penis (glans penis) caused by irritation or infection.  Balanitis causes pain, redness, and swelling of the glans penis.  This condition is most common among uncircumcised men who do not keep their glans penis clean and in men who have diabetes.  Treatment may include creams or ointments.  Good hygiene is important for prevention. This includes pulling back the foreskin when washing your penis. This information is not intended to replace advice given to you by your health care provider. Make sure you discuss any questions you have with your health care provider. Document Released: 07/06/2008 Document Revised: 01/30/2017 Document Reviewed: 01/07/2016 Elsevier Patient Education  2020 [employer]  

## 2018-09-28 NOTE — Progress Notes (Signed)
Subjective:    Patient ID: Harry [name], male    DOB: 1955-12-24, 63 y.o.   MRN: [id number]  HPI 63 y.o. MW with history of elevated PSA, BPH, DM2 with ED presents with possible UTI, symptoms started 09/25/2018 He complains of itching and burning during urination, slight clear discharge from his penis. Has had weak stream, some dribbling and urgency. Has been on doxazosin and tamsulosin in the past. He has appointment with urology sept 1st.  Had negative gonorrheae/chylmydia 07/21 for CPE.  He has increase his water intake.  He denies chills, fever, flank pain, hematuria, lower abdominal pain and urinary incontinence.  He is on cialis 20mg  AS needed.  CT AB 12/2016, no renal calculi Lab Results  Component Value Date   HGBA1C 6.9 (A) 09/20/2018   Lab Results  Component Value Date   PSA 4.1 (H) 09/21/2018   PSA 3.2 08/04/2017   PSA 3.0 06/02/2016    Blood pressure 120/70, pulse 64, temperature 97.9 F (36.6 C), height 6\' 1"  (1.854 m), weight 255 lb (115.7 kg), SpO2 97 %.  Medications Current Outpatient Medications on File Prior to Visit  Medication Sig  . aspirin 81 MG tablet Take 81 mg by mouth daily.  Marland Kitchen atorvastatin (LIPITOR) 40 MG tablet TAKE 1 TABLET DAILY FOR CHOLESTEROL  . bisoprolol-hydrochlorothiazide (ZIAC) 5-6.25 MG tablet TAKE 1 TABLET DAILY FOR BLOOD PRESSURE  . Cholecalciferol (VITAMIN D) 125 MCG (5000 UT) CAPS Take 10,000 Units by mouth daily.  Marland Kitchen glucose 4 GM chewable tablet Chew 1 tablet (4 g total) by mouth as needed for low blood sugar.  . insulin glargine (LANTUS) 100 UNIT/ML injection Inject 0.5 mLs (50 Units total) into the skin daily.  . insulin lispro (HUMALOG) 100 UNIT/ML injection INJECT 60 TO 80 UNITS UNDER THE SKIN PER DAY WITH MEALS DEPENDING ON MEALS (Patient taking differently: Inject 15-25 Units into the skin 3 (three) times daily with meals. )  . Insulin Syringe-Needle U-100 (BD INSULIN SYRINGE U/F) 31G X 5/16" 0.5 ML MISC Inject Insulin 2 x /day  as directed  . losartan-hydrochlorothiazide (HYZAAR) 100-12.5 MG tablet Take 1 tablet Daily for BP  . Magnesium 500 MG TABS Take 1 tablet by mouth daily.  . metFORMIN (GLUCOPHAGE-XR) 500 MG 24 hr tablet TAKE 2 TABLETS TWICE A DAY WITH FOOD FOR DIABETES  . omeprazole (PRILOSEC) 20 MG capsule TAKE 1 CAPSULE DAILY FOR ACID REFLUX  . ONE TOUCH ULTRA TEST test strip USE 3 TO 4 TIMES A DAY FOR FLUCTUATING BLOOD SUGARS  . potassium chloride SA (K-DUR,KLOR-CON) 20 MEQ tablet TAKE 1 TABLET TWICE A DAY  . tadalafil (CIALIS) 20 MG tablet Take 1 tablet (20 mg total) by mouth daily as needed for erectile dysfunction.  . TRULICITY 1.5 WU/9.8JX SOPN INJECT 1.5 MG UNDER THE SKIN ONCE A WEEK   No current facility-administered medications on file prior to visit.     Problem list He has Ventral hernia - periumbilical 9-1YN; Obesity (BMI 30.0-34.9); Hyperlipidemia, mixed; Essential hypertension; Vitamin D deficiency; GERD (gastroesophageal reflux disease); CKD stage 2 due to type 2 diabetes mellitus ([place]); Obesity hypoventilation syndrome ([facility]); BPH (benign prostatic hyperplasia); Erectile dysfunction associated with type 2 diabetes mellitus ([place]); PVC's (premature ventricular contractions); Aneurysm, ascending aorta ([facility]); LADA (latent autoimmune diabetes in adults), managed as type 2 ([place]); Family history of cerebrovascular disease; OSA on CPAP; and Elevated PSA on their problem list.   Review of Systems  Constitutional: Negative.  Negative for chills and fever.  Respiratory: Negative.  Cardiovascular: Negative.   Gastrointestinal: Negative.   Genitourinary: Positive for discharge, dysuria, frequency, penile pain, penile swelling and urgency. Negative for decreased urine volume, difficulty urinating, enuresis, flank pain, genital sores, hematuria, scrotal swelling and testicular pain.  Musculoskeletal: Negative.  Negative for back pain.  Skin: Negative.  Negative for rash.       Objective:   Physical  Exam Constitutional:      Appearance: Normal appearance. He is obese.  Cardiovascular:     Rate and Rhythm: Normal rate and regular rhythm.     Pulses: Normal pulses.     Heart sounds: Normal heart sounds.  Pulmonary:     Effort: Pulmonary effort is normal.     Breath sounds: Normal breath sounds.  Abdominal:     General: Bowel sounds are normal.     Palpations: Abdomen is soft.     Tenderness: There is no abdominal tenderness. There is no right CVA tenderness or left CVA tenderness.  Genitourinary:    Pubic Area: No rash.      Penis: Circumcised. Tenderness and discharge (green/yellow discharge) present.      Scrotum/Testes: Normal.  Musculoskeletal: Normal range of motion.  Skin:    General: Skin is warm and dry.     Findings: No rash.  Neurological:     General: No focal deficit present.     Mental Status: He is alert.       Assessment & Plan:  Harry [name] was seen today for acute visit.  Diagnoses and all orders for this visit:  CKD stage 2 due to type 2 diabetes mellitus ([place]) Discussed disease progression and risks Discussed diet/exercise, weight management and risk modification  Benign prostatic hyperplasia with nocturia -     alfuzosin (UROXATRAL) 10 MG 24 hr tablet; Take 1 tablet (10 mg total) by mouth daily with breakfast. Has failed tamsuloxin and doxazosin in the past Follow up with urology  Elevated PSA Follow up with urology  Penile discharge -  Wrong collection for yeast- will treat -     fluconazole (DIFLUCAN) 150 MG tablet; Take 1 tablet (150 mg total) by mouth daily. -     nystatin cream (MYCOSTATIN); Apply 1 application topically 2 (two) times daily. -     Trichomonas vaginalis RNA, Ql,Males  Dysuria -     Urinalysis, Routine w reflex microscopic -     Urine Culture -     fluconazole (DIFLUCAN) 150 MG tablet; Take 1 tablet (150 mg total) by mouth daily. -     nystatin cream (MYCOSTATIN); Apply 1 application topically 2 (two) times daily. - will  not give ABX just yet, pending urine may send in for possible prostatitis but likely more yeast balanitis in obese DM male Due to green tint to discharge and + sexually active will rule out trichomonas  Balanitis -     fluconazole (DIFLUCAN) 150 MG tablet; Take 1 tablet (150 mg total) by mouth daily. -     nystatin cream (MYCOSTATIN); Apply 1 application topically 2 (two) times daily.

## 2018-09-30 LAB — URINALYSIS, ROUTINE W REFLEX MICROSCOPIC
Bacteria, UA: NONE SEEN /HPF
Bilirubin Urine: NEGATIVE
Glucose, UA: NEGATIVE
Hgb urine dipstick: NEGATIVE
Hyaline Cast: NONE SEEN /LPF
Ketones, ur: NEGATIVE
Nitrite: NEGATIVE
Protein, ur: NEGATIVE
RBC / HPF: NONE SEEN /HPF (ref 0–2)
Specific Gravity, Urine: 1.02 (ref 1.001–1.03)
Squamous Epithelial / HPF: NONE SEEN /HPF (ref <=5)
pH: <=5 (ref 5.0–8.0)

## 2018-09-30 LAB — TRICHOMONAS VAGINALIS RNA, QL,MALES: Trichomonas vaginalis RNA: NOT DETECTED

## 2018-09-30 LAB — URINE CULTURE
MICRO NUMBER:: 712392
Result:: NO GROWTH
SPECIMEN QUALITY:: ADEQUATE

## 2018-10-04 MED ORDER — SULFAMETHOXAZOLE-TRIMETHOPRIM 800-160 MG PO TABS: 1.0000 | ORAL_TABLET | Freq: Two times a day (BID) | ORAL | 0 refills | Status: DC

## 2018-10-06 ENCOUNTER — Other Ambulatory Visit: Payer: Self-pay | Admitting: Physician Assistant

## 2018-10-06 DIAGNOSIS — R300 Dysuria: Secondary | ICD-10-CM

## 2018-10-06 DIAGNOSIS — R369 Urethral discharge, unspecified: Secondary | ICD-10-CM

## 2018-10-06 DIAGNOSIS — N481 Balanitis: Secondary | ICD-10-CM

## 2018-10-06 MED ORDER — FLUCONAZOLE 150 MG PO TABS: 150.0000 mg | ORAL_TABLET | Freq: Every day | ORAL | 1 refills | Status: DC

## 2018-10-06 MED ORDER — LEVOFLOXACIN 500 MG PO TABS: 500.0000 mg | ORAL_TABLET | Freq: Every day | ORAL | 0 refills | Status: DC

## 2018-10-07 ENCOUNTER — Other Ambulatory Visit: Payer: Self-pay | Admitting: Physician Assistant

## 2018-10-07 MED ORDER — DOXAZOSIN MESYLATE 4 MG PO TABS: 4.0000 mg | ORAL_TABLET | Freq: Every day | ORAL | 0 refills | Status: DC

## 2018-10-24 ENCOUNTER — Other Ambulatory Visit: Payer: Self-pay | Admitting: Internal Medicine

## 2018-11-02 ENCOUNTER — Ambulatory Visit: Payer: Self-pay | Admitting: Urology

## 2018-11-02 ENCOUNTER — Encounter

## 2018-11-16 ENCOUNTER — Other Ambulatory Visit: Payer: Self-pay | Admitting: Internal Medicine

## 2018-12-21 NOTE — Progress Notes (Signed)
3 MONTH FOLLOW UP   Assessment and Plan:   Essential hypertension - continue medications, DASH diet, exercise and monitor at home. Call if greater than 130/80.  -     CBC with Differential/Platelet -     CMP/GFR -     TSH  Aneurysm, ascending aorta (HCC) Control blood pressure, cholesterol, glucose, increase exercise.   Obesity hypoventilation syndrome (HCC) Continue CPAP, got a new one. Weight loss encouraged  LADA managed as T2DM ([place]) Managed by [name]  Discussed general issues about diabetes pathophysiology and management., Educational material distributed., Suggested low cholesterol diet., Encouraged aerobic exercise., Discussed foot care., Reminded to get yearly retinal exam.  CKD stage 2 due to type 2 diabetes mellitus ([place]) Increase fluids, avoid NSAIDS, monitor sugars, will monitor CMP/GFR  Erectile dysfunction associated with type 2 diabetes mellitus ([place]) Glucose control recently improved, managing well with cialis PRN  Hyperlipidemia, unspecified hyperlipidemia type -continue medications, check lipids, decrease fatty foods, increase activity.  -     Lipid panel  Obesity with body mass index (BMI) of 30.0 to 34.9 - follow up 3 months for progress monitoring - increase veggies, decrease carbs - long discussion about weight loss, diet, and exercise  Medication management -     Magnesium  Need for influenza vaccine - quad valent administered without complication  Discussed med's effects and SE's. Screening labs and tests as requested with regular follow-up as recommended. Future Appointments  Date Time Provider [place]  12/30/2018  8:00 AM Skeet Latch, MD [facility]  01/20/2019  4:00 PM [name], MD [facility] None  03/24/2019  3:45 PM Liane Comber, NP [facility] None  09/29/2019 10:00 AM Liane Comber, NP [facility] None    HPI Patient presents for 3 month follow up for HTN, chol, DM with CKD, GERD, obesity, AAA.    He is newly following with Dr. Karsten Ro due to hematuria and elevated PSAs.  Wife passed away from cancer in 17-May-2018 from breast cancer recurrence. He has 2 children, 2 grandkids. He works from office doing estimates. He feels he is doing well and managing.   He has OSA on CPAP, also with obesity hypoventilation syndrome and followed by neurology; he endorses 100% compliance with restorative sleep.    BMI is Body mass index is 34.12 kg/m., he has not been working on diet and exercise but [place], plans to do 30 min, and needs to cut back on snacking in the evenings.  Wt Readings from Last 3 Encounters:  12/22/18 258 lb 9.6 oz (117.3 kg)  09/28/18 255 lb (115.7 kg)  09/21/18 255 lb (115.7 kg)   He has known ascending aorta dilation, It was 4.2 cm in 2016, 4.1 cm per recent ECHO 11/2017 by cardiology. He had repeat stress test and ECHO in 11/2017 which were essentially normal by Dr. Sallyanne Kuster. He is following with Jacinto Reap, PA with vascular, has upcoming appointment later this month.  His blood pressure has not been checked at home, today their BP is BP: 120/78 He does not workout. He denies chest pain, shortness of breath, dizziness, shortness breath.    He is on cholesterol medication, lipitor 40mg  and denies myalgias. His cholesterol is at goal. The cholesterol last visit was:   Lab Results  Component Value Date   CHOL 141 09/21/2018   HDL 48 09/21/2018   LDLCALC 69 09/21/2018   TRIG 164 (H) 09/21/2018   CHOLHDL 2.9 09/21/2018   He has been working on  diet and exercise for insulin dependent diabetes with CKD stage 2, he has had DM x 30 + years, he is on bASA, he is on ACE/ARB, metformin, trulicity; he is now following with [name], Dr. Gershon Crane eye exam and is still on lantus 50 units daily, and on humolog 20-30 units before meals depending on size, denies any low blood sugars, normally not taking any humolog at bed time and denies paresthesia of the  feet, polydipsia, polyuria and visual disturbances.  He reports fasting values range 120-160; recently elevated due to late night snacking Has had some low glucose down to 64 in AM; adjusts insulin accordingly  He takes cialis 20 mg PRN for ED  Last A1C in the office was:  Lab Results  Component Value Date   HGBA1C 6.9 (A) 09/20/2018   Lab Results  Component Value Date   GFRNONAA 92 09/21/2018   Patient is on Vitamin D supplement, admits wasn't taking regularly, doing more consistently and taking 5000 IU daily .   Lab Results  Component Value Date   VD25OH 36 09/21/2018     Current Medications:  Current Outpatient Medications on File Prior to Visit  Medication Sig Dispense Refill  . alfuzosin (UROXATRAL) 10 MG 24 hr tablet Take 1 tablet (10 mg total) by mouth daily with breakfast. 30 tablet 1  . aspirin 81 MG tablet Take 81 mg by mouth daily.    Marland Kitchen atorvastatin (LIPITOR) 40 MG tablet TAKE 1 TABLET DAILY FOR CHOLESTEROL 90 tablet 3  . BD INSULIN SYRINGE U/F 31G X 5/16" 0.5 ML MISC INJECT INSULIN TWICE A DAY AS DIRECTED 200 each 3  . bisoprolol-hydrochlorothiazide (ZIAC) 5-6.25 MG tablet TAKE 1 TABLET DAILY FOR BLOOD PRESSURE 90 tablet 4  . Cholecalciferol (VITAMIN D) 125 MCG (5000 UT) CAPS Take 10,000 Units by mouth daily. 60 capsule 0  . doxazosin (CARDURA) 4 MG tablet Take 1 tablet (4 mg total) by mouth daily. 30 tablet 0  . glucose 4 GM chewable tablet Chew 1 tablet (4 g total) by mouth as needed for low blood sugar. 30 tablet 12  . insulin glargine (LANTUS) 100 UNIT/ML injection Inject 0.5 mLs (50 Units total) into the skin daily. 30 mL 3  . insulin lispro (HUMALOG) 100 UNIT/ML injection INJECT 60 TO 80 UNITS UNDER THE SKIN PER DAY WITH MEALS DEPENDING ON MEALS (Patient taking differently: Inject 15-25 Units into the skin 3 (three) times daily with meals. ) 210 mL 4  . losartan-hydrochlorothiazide (HYZAAR) 100-12.5 MG tablet Take 1 tablet Daily for BP 90 tablet 3  . Magnesium 500  MG TABS Take 1 tablet by mouth daily.    . metFORMIN (GLUCOPHAGE-XR) 500 MG 24 hr tablet TAKE 2 TABLETS TWICE A DAY WITH FOOD FOR DIABETES 360 tablet 4  . mirabegron ER (MYRBETRIQ) 50 MG TB24 tablet Take 50 mg by mouth daily.     Marland Kitchen omeprazole (PRILOSEC) 20 MG capsule Take 1 capsule Daily for Indigestion & Heartburn 90 capsule 3  . ONE TOUCH ULTRA TEST test strip USE 3 TO 4 TIMES A DAY FOR FLUCTUATING BLOOD SUGARS 400 each 3  . potassium chloride SA (K-DUR,KLOR-CON) 20 MEQ tablet TAKE 1 TABLET TWICE A DAY 180 tablet 4  . tadalafil (CIALIS) 20 MG tablet Take 1 tablet (20 mg total) by mouth daily as needed for erectile dysfunction. 60 tablet 3  . TRULICITY 1.5 0000000 SOPN INJECT 1.5 MG UNDER THE SKIN ONCE A WEEK 6 mL 4  . fluconazole (DIFLUCAN) 150 MG  tablet Take 1 tablet (150 mg total) by mouth daily. 4 tablet 1  . levofloxacin (LEVAQUIN) 500 MG tablet Take 1 tablet (500 mg total) by mouth daily. 10 tablet 0  . nystatin cream (MYCOSTATIN) Apply 1 application topically 2 (two) times daily. (Patient not taking: Reported on 12/22/2018) 30 g 1   No current facility-administered medications on file prior to visit.     Allergies:  Allergies  Allergen Reactions  . Crestor [Rosuvastatin] Cough   Medical History:  Past Medical History:  Diagnosis Date  . CKD (chronic kidney disease) stage 2, GFR 60-89 ml/min    secondary to DM  . Diabetes mellitus without complication ([place]) [id number]   insulin requiring  . GERD (gastroesophageal reflux disease)   . Hyperlipidemia   . Hypertension   . Hypokalemia   . Hypomagnesemia   . OSA on CPAP    10-12 years ago  . Vitamin D deficiency    Surgical History: reviewed and unchanged Family History: reviewed and unchanged Social History: reviewed and unchanged  Review of Systems:  Review of Systems  Constitutional: Negative.  Negative for malaise/fatigue and weight loss.  HENT: Negative.  Negative for hearing loss and tinnitus.   Eyes: Negative.  Negative  for blurred vision and double vision.  Respiratory: Negative for cough, hemoptysis, sputum production, shortness of breath and wheezing.   Cardiovascular: Negative.  Negative for chest pain, palpitations, orthopnea, claudication and leg swelling.  Gastrointestinal: Negative for abdominal pain, blood in stool, constipation, diarrhea, melena, nausea and vomiting. Heartburn: better with PPI.  Genitourinary: Negative.   Musculoskeletal: Negative.  Negative for joint pain and myalgias.  Skin: Negative.  Negative for rash.  Neurological: Negative.  Negative for dizziness, tingling, sensory change, weakness and headaches.  Endo/Heme/Allergies: Negative.  Negative for polydipsia.  Psychiatric/Behavioral: Negative.   All other systems reviewed and are negative.   Physical Exam: Estimated body mass index is 34.12 kg/m as calculated from the following:   Height as of this encounter: 6\' 1"  (1.854 m).   Weight as of this encounter: 258 lb 9.6 oz (117.3 kg). BP 120/78   Pulse 76   Temp 97.7 F (36.5 C)   Ht 6\' 1"  (1.854 m)   Wt 258 lb 9.6 oz (117.3 kg)   SpO2 96%   BMI 34.12 kg/m  General Appearance: Well nourished, in no apparent distress.  Eyes: PERRLA, EOMs, conjunctiva no swelling or erythema, normal fundi and vessels.  Sinuses: No Frontal/maxillary tenderness  ENT/Mouth: Ext aud canals clear, normal light reflex with TMs without erythema, bulging. Good dentition. No erythema, swelling, or exudate on post pharynx. Tonsils not swollen or erythematous. Hearing normal.  Neck: Supple, thyroid normal. No bruits  Respiratory: Respiratory effort normal, BS equal bilaterally without rales, rhonchi, wheezing or stridor.  Cardio: RRR without murmurs, rubs or gallops, decreased HS due to body habitus. Brisk peripheral pulses without edema.  Chest: symmetric, with normal excursions and percussion.  Abdomen: Soft, nontender, obese, + ventral hernia, no guarding, rebound, masses, or organomegaly.   Lymphatics: Non tender without lymphadenopathy.  Musculoskeletal: Full ROM all peripheral extremities,5/5 strength, and normal gait.  Skin: Warm, dry without rashes, lesions, ecchymosis. Neuro: Cranial nerves intact, reflexes equal bilaterally. Normal muscle tone, no cerebellar symptoms. Sensation intact.  Psych: Awake and oriented X 3, normal affect, Insight and Judgment appropriate.   Izora Ribas 3:51 PM [facility]

## 2018-12-22 ENCOUNTER — Other Ambulatory Visit: Payer: Self-pay

## 2018-12-22 ENCOUNTER — Encounter: Payer: Self-pay | Admitting: Adult Health

## 2018-12-22 ENCOUNTER — Ambulatory Visit: Payer: 59 | Admitting: Adult Health

## 2018-12-22 VITALS — BP 120/78 | HR 76 | Temp 97.7°F | Ht 73.0 in | Wt 258.6 lb

## 2018-12-22 DIAGNOSIS — E559 Vitamin D deficiency, unspecified: Secondary | ICD-10-CM

## 2018-12-22 DIAGNOSIS — G4733 Obstructive sleep apnea (adult) (pediatric): Secondary | ICD-10-CM | POA: Diagnosis not present

## 2018-12-22 DIAGNOSIS — I7121 Aneurysm of the ascending aorta, without rupture: Secondary | ICD-10-CM

## 2018-12-22 DIAGNOSIS — E1122 Type 2 diabetes mellitus with diabetic chronic kidney disease: Secondary | ICD-10-CM | POA: Diagnosis not present

## 2018-12-22 DIAGNOSIS — I712 Thoracic aortic aneurysm, without rupture: Secondary | ICD-10-CM | POA: Diagnosis not present

## 2018-12-22 DIAGNOSIS — E139 Other specified diabetes mellitus without complications: Secondary | ICD-10-CM

## 2018-12-22 DIAGNOSIS — E669 Obesity, unspecified: Secondary | ICD-10-CM

## 2018-12-22 DIAGNOSIS — E1169 Type 2 diabetes mellitus with other specified complication: Secondary | ICD-10-CM

## 2018-12-22 DIAGNOSIS — N521 Erectile dysfunction due to diseases classified elsewhere: Secondary | ICD-10-CM

## 2018-12-22 DIAGNOSIS — E66811 Obesity, class 1: Secondary | ICD-10-CM

## 2018-12-22 DIAGNOSIS — I10 Essential (primary) hypertension: Secondary | ICD-10-CM

## 2018-12-22 DIAGNOSIS — N182 Chronic kidney disease, stage 2 (mild): Secondary | ICD-10-CM

## 2018-12-22 DIAGNOSIS — E785 Hyperlipidemia, unspecified: Secondary | ICD-10-CM | POA: Diagnosis not present

## 2018-12-22 DIAGNOSIS — E662 Morbid (severe) obesity with alveolar hypoventilation: Secondary | ICD-10-CM

## 2018-12-22 DIAGNOSIS — K2100 Gastro-esophageal reflux disease with esophagitis, without bleeding: Secondary | ICD-10-CM

## 2018-12-22 DIAGNOSIS — Z9989 Dependence on other enabling machines and devices: Secondary | ICD-10-CM

## 2018-12-22 NOTE — Patient Instructions (Addendum)
Goals    . Exercise 150 min/wk Moderate Activity    . Weight (lb) < 225 lb (102.1 kg)     Weigh in once a week and keep a log Cut back on snacking        Drink 1/2 your body weight in fluid ounces of water daily; drink a tall glass of water 30 min before meals  Don't eat until you're stuffed- listen to your stomach and eat until you are 80% full   Try eating off of a salad plate; wait 10 min after finishing before going back for seconds  Start by eating the vegetables on your plate; aim for 50% of your meals to be fruits or vegetables  Then eat your protein - lean meats (grass fed if possible), fish, beans, nuts in moderation  Eat your carbs/starch last ONLY if you still are hungry. If you can, stop before finishing it all  Avoid sugar and flour - the closer it looks to it's original form in nature, typically the better it is for you  Splurge in moderation - "assign" days when you get to splurge and have the "bad stuff" - I like to follow a 80% - 20% plan- "good" choices 80 % of the time, "bad" choices in moderation 20% of the time  Simple equation is: Calories out > calories in = weight loss - even if you eat the bad stuff, if you limit portions, you will still lose weight         When it comes to diets, agreement about the perfect plan isn't easy to find, even among the experts. Experts at the [employer] developed an idea known as the Healthy Eating Plate. Just imagine a plate divided into logical, healthy portions.  The emphasis is on diet quality:  Load up on vegetables and fruits - one-half of your plate: Aim for color and variety, and remember that potatoes don't count.  Go for whole grains - one-quarter of your plate: Whole wheat, barley, wheat berries, quinoa, oats, brown rice, and foods made with them. If you want pasta, go with whole wheat pasta.  Protein power - one-quarter of your plate: Fish, chicken, beans, and nuts are all healthy,  versatile protein sources. Limit red meat.  The diet, however, does go beyond the plate, offering a few other suggestions.  Use healthy plant oils, such as olive, canola, soy, corn, sunflower and peanut. Check the labels, and avoid partially hydrogenated oil, which have unhealthy trans fats.  If you're thirsty, drink water. Coffee and tea are good in moderation, but skip sugary drinks and limit milk and dairy products to one or two daily servings.  The type of carbohydrate in the diet is more important than the amount. Some sources of carbohydrates, such as vegetables, fruits, whole grains, and beans-are healthier than others.  Finally, stay active.

## 2018-12-23 LAB — CBC WITH DIFFERENTIAL/PLATELET
Absolute Monocytes: 490 cells/uL (ref 200–950)
Basophils Absolute: 63 cells/uL (ref 0–200)
Basophils Relative: 0.8 %
Eosinophils Absolute: 103 cells/uL (ref 15–500)
Eosinophils Relative: 1.3 %
HCT: 48.2 % (ref 38.5–50.0)
Hemoglobin: 15.8 g/dL (ref 13.2–17.1)
Lymphs Abs: 1462 cells/uL (ref 850–3900)
MCH: 28.1 pg (ref 27.0–33.0)
MCHC: 32.8 g/dL (ref 32.0–36.0)
MCV: 85.6 fL (ref 80.0–100.0)
MPV: 10.9 fL (ref 7.5–12.5)
Monocytes Relative: 6.2 %
Neutro Abs: 5783 cells/uL (ref 1500–7800)
Neutrophils Relative %: 73.2 %
Platelets: 242 10*3/uL (ref 140–400)
RBC: 5.63 10*6/uL (ref 4.20–5.80)
RDW: 13.6 % (ref 11.0–15.0)
Total Lymphocyte: 18.5 %
WBC: 7.9 10*3/uL (ref 3.8–10.8)

## 2018-12-23 LAB — COMPLETE METABOLIC PANEL WITH GFR
AG Ratio: 1.5 (calc) (ref 1.0–2.5)
ALT: 21 U/L (ref 9–46)
AST: 23 U/L (ref 10–35)
Albumin: 4 g/dL (ref 3.6–5.1)
Alkaline phosphatase (APISO): 81 U/L (ref 35–144)
BUN: 14 mg/dL (ref 7–25)
CO2: 27 mmol/L (ref 20–32)
Calcium: 9.6 mg/dL (ref 8.6–10.3)
Chloride: 103 mmol/L (ref 98–110)
Creat: 1.01 mg/dL (ref 0.70–1.25)
GFR, Est African American: 91 mL/min/{1.73_m2} (ref >=60)
GFR, Est Non African American: 78 mL/min/{1.73_m2} (ref >=60)
Globulin: 2.7 g/dL (calc) (ref 1.9–3.7)
Glucose, Bld: 109 mg/dL — ABNORMAL HIGH (ref 65–99)
Potassium: 3.7 mmol/L (ref 3.5–5.3)
Sodium: 140 mmol/L (ref 135–146)
Total Bilirubin: 0.8 mg/dL (ref 0.2–1.2)
Total Protein: 6.7 g/dL (ref 6.1–8.1)

## 2018-12-23 LAB — MAGNESIUM: Magnesium: 1.9 mg/dL (ref 1.5–2.5)

## 2018-12-23 LAB — LIPID PANEL
Cholesterol: 138 mg/dL (ref <200)
HDL: 52 mg/dL (ref >=40)
LDL Cholesterol (Calc): 63 mg/dL (calc)
Non-HDL Cholesterol (Calc): 86 mg/dL (calc) (ref <130)
Total CHOL/HDL Ratio: 2.7 (calc) (ref <5.0)
Triglycerides: 155 mg/dL — ABNORMAL HIGH (ref <150)

## 2018-12-23 LAB — TSH: TSH: 2.06 mIU/L (ref 0.40–4.50)

## 2018-12-30 ENCOUNTER — Ambulatory Visit (INDEPENDENT_AMBULATORY_CARE_PROVIDER_SITE_OTHER): Payer: 59 | Admitting: Cardiovascular Disease

## 2018-12-30 ENCOUNTER — Other Ambulatory Visit: Payer: Self-pay

## 2018-12-30 ENCOUNTER — Encounter: Payer: Self-pay | Admitting: Cardiovascular Disease

## 2018-12-30 VITALS — BP 132/82 | HR 65 | Ht 74.0 in | Wt 258.0 lb

## 2018-12-30 DIAGNOSIS — I712 Thoracic aortic aneurysm, without rupture: Secondary | ICD-10-CM | POA: Diagnosis not present

## 2018-12-30 DIAGNOSIS — I7121 Aneurysm of the ascending aorta, without rupture: Secondary | ICD-10-CM

## 2018-12-30 DIAGNOSIS — E7800 Pure hypercholesterolemia, unspecified: Secondary | ICD-10-CM

## 2018-12-30 DIAGNOSIS — I10 Essential (primary) hypertension: Secondary | ICD-10-CM

## 2018-12-30 NOTE — Progress Notes (Signed)
Cardiology Office Note   Date:  12/30/2018   ID:  Harry [name], DOB [birth year], MRN [id number]  PCP:  Harry Pinto, MD  Cardiologist:   Harry Latch, MD   No chief complaint on file.     History of Present Illness: Harry [name] is a 63 y.o. male with mild ascending aorta aneurysm, hypertension, hyperlipidemia, OSA on CPAP, and diabetes who is here to re-establish care.  [name] was previously a patient of [name].  He was last seen 05/22/2014.  He was initially referred due to an abnormal EKG and exertional dyspnea.  Exercise nuclear stress test 05/2014 was negative for ischemia but did show multiple runs of PVCs and short runs of NSVT.  He was subsequently referred for an echocardiogram 05/2014 that revealed LVEF 60 to 65% with grade 1 diastolic dysfunction.  He was noted to have a mild ascending aortic aneurysm measuring 4.2 cm.   At his last appointment [name] reported feeling very fatigued.  He was referred for a Lexiscan Myoview 11/2014 that was negative for ischemia.  He also had a follow up echo for aortic aneurysm that was unchanged from prior.  He has been feeling well. He hasn't been very active lately.  He saw his PCP two weeks ago and has started to increase his walking.  He doesn't have any exertional chest pain or shortness of breath.  He is also trying to eat more vegetables and limit carbs.  He has been limiting carbohydrates.  He has noticed that he is requiring less insulin.  He doesn't check his BP at home.  When it gets checked with his doctors it is always controlled.  He uses his CPAP regularly.  Past Medical History:  Diagnosis Date  . CKD (chronic kidney disease) stage 2, GFR 60-89 ml/min    secondary to DM  . Diabetes mellitus without complication ([place]) A999333   insulin requiring  . GERD (gastroesophageal reflux disease)   . Hyperlipidemia   . Hypertension   . Hypokalemia   . Hypomagnesemia   . OSA on CPAP    10-12 years ago  . Vitamin D  deficiency     Past Surgical History:  Procedure Laterality Date  . ANAL FISSURE REPAIR  2007  . TONSILLECTOMY Bilateral    As a child  . VENTRAL HERNIA REPAIR  04/13/12   Dr. Johney Maine, ventral hernia repair w/ mesh     Current Outpatient Medications  Medication Sig Dispense Refill  . aspirin 81 MG tablet Take 81 mg by mouth daily.    Marland Kitchen atorvastatin (LIPITOR) 40 MG tablet TAKE 1 TABLET DAILY FOR CHOLESTEROL 90 tablet 3  . BD INSULIN SYRINGE U/F 31G X 5/16" 0.5 ML MISC INJECT INSULIN TWICE A DAY AS DIRECTED 200 each 3  . bisoprolol-hydrochlorothiazide (ZIAC) 5-6.25 MG tablet TAKE 1 TABLET DAILY FOR BLOOD PRESSURE 90 tablet 4  . Cholecalciferol (VITAMIN D) 125 MCG (5000 UT) CAPS Take 10,000 Units by mouth daily. 60 capsule 0  . doxazosin (CARDURA) 4 MG tablet Take 1 tablet (4 mg total) by mouth daily. 30 tablet 0  . glucose 4 GM chewable tablet Chew 1 tablet (4 g total) by mouth as needed for low blood sugar. 30 tablet 12  . insulin glargine (LANTUS) 100 UNIT/ML injection Inject 0.5 mLs (50 Units total) into the skin daily. 30 mL 3  . insulin lispro (HUMALOG) 100 UNIT/ML injection INJECT 60 TO 80 UNITS UNDER THE SKIN PER DAY WITH MEALS DEPENDING  ON MEALS (Patient taking differently: Inject 15-25 Units into the skin 3 (three) times daily with meals. ) 210 mL 4  . losartan-hydrochlorothiazide (HYZAAR) 100-12.5 MG tablet Take 1 tablet Daily for BP 90 tablet 3  . Magnesium 500 MG TABS Take 1 tablet by mouth daily.    . metFORMIN (GLUCOPHAGE-XR) 500 MG 24 hr tablet TAKE 2 TABLETS TWICE A DAY WITH FOOD FOR DIABETES 360 tablet 4  . omeprazole (PRILOSEC) 20 MG capsule Take 1 capsule Daily for Indigestion & Heartburn 90 capsule 3  . ONE TOUCH ULTRA TEST test strip USE 3 TO 4 TIMES A DAY FOR FLUCTUATING BLOOD SUGARS 400 each 3  . potassium chloride SA (K-DUR,KLOR-CON) 20 MEQ tablet TAKE 1 TABLET TWICE A DAY 180 tablet 4  . tadalafil (CIALIS) 20 MG tablet Take 1 tablet (20 mg total) by mouth daily as  needed for erectile dysfunction. 60 tablet 3  . TRULICITY 1.5 0000000 SOPN INJECT 1.5 MG UNDER THE SKIN ONCE A WEEK 6 mL 4   No current facility-administered medications for this visit.     Allergies:   Crestor [rosuvastatin]    Social History:  The patient  reports that he has never smoked. He has never used smokeless tobacco. He reports current alcohol use of about 2.0 - 5.0 standard drinks of alcohol per week. He reports that he does not use drugs.   Family History:  The patient's family history includes Cancer in his paternal grandfather; Cancer (age of onset: 79) in his paternal uncle; Dementia in his paternal grandmother; Kidney cancer (age of onset: 37) in his father; Stroke in his maternal grandmother.    ROS:  Please see the history of present illness.   Otherwise, review of systems are positive for none.   All other systems are reviewed and negative.    PHYSICAL EXAM: VS:  BP 132/82   Pulse 65   Ht 6\' 2"  (1.88 m)   Wt 258 lb (117 kg)   SpO2 98%   BMI 33.13 kg/m  , BMI Body mass index is 33.13 kg/m. GENERAL:  Well appearing HEENT: Pupils equal round and reactive, fundi not visualized, oral mucosa unremarkable NECK:  No jugular venous distention, waveform within normal limits, carotid upstroke brisk and symmetric, no bruits LUNGS:  Clear to auscultation bilaterally HEART:  RRR.  PMI not displaced or sustained,S1 and S2 within normal limits, no S3, no S4, no clicks, no rubs, no murmurs ABD:  Flat, positive bowel sounds normal in frequency in pitch, no bruits, no rebound, no guarding, no midline pulsatile mass, no hepatomegaly, no splenomegaly EXT:  2 plus pulses throughout, no edema, no cyanosis no clubbing SKIN:  No rashes no nodules NEURO:  Cranial nerves II through XII grossly intact, motor grossly intact throughout PSYCH:  Cognitively intact, oriented to person place and time    EKG:  EKG is ordered today. 12/30/2018: Sinus rhythm.  Rate 65 bpm.  Incomplete right  bundle branch block poor R wave progression.  Lexiscan Myoview 11/12/17:  The left ventricular ejection fraction is normal (55-65%).  Nuclear stress EF: 57%.  This is a low risk study.  There was no ST segment deviation noted during stress.  The study is normal.   Low risk stress nuclear study with normal perfusion and normal left ventricular regional and global systolic function.  Echo 11/16/17: Study Conclusions  - Left ventricle: The cavity size was normal. Systolic function was   normal. The estimated ejection fraction was in the range of  60%   to 65%. Wall motion was normal; there were no regional wall   motion abnormalities. Doppler parameters are consistent with   abnormal left ventricular relaxation (grade 1 diastolic   dysfunction). - Aortic valve: There was no regurgitation. - Aorta: Ascending aortic diameter: 41 mm (S). - Ascending aorta: The ascending aorta was mildly dilated. - Mitral valve: There was no significant regurgitation. - Left atrium: The atrium was mildly dilated. - Right ventricle: Systolic function was normal. - Right atrium: The atrium was mildly dilated. - Atrial septum: No defect or patent foramen ovale was identified. - Tricuspid valve: There was trivial regurgitation.  Impressions:  Recent Labs: 12/22/2018: ALT 21; BUN 14; Creat 1.01; Hemoglobin 15.8; Magnesium 1.9; Platelets 242; Potassium 3.7; Sodium 140; TSH 2.06    Lipid Panel    Component Value Date/Time   CHOL 138 12/22/2018 1627   TRIG 155 (H) 12/22/2018 1627   HDL 52 12/22/2018 1627   CHOLHDL 2.7 12/22/2018 1627   VLDL 40 (H) 06/02/2016 1541   LDLCALC 63 12/22/2018 1627      Wt Readings from Last 3 Encounters:  12/30/18 258 lb (117 kg)  12/22/18 258 lb 9.6 oz (117.3 kg)  09/28/18 255 lb (115.7 kg)      ASSESSMENT AND PLAN:  # Exertional dyspnea:  Symptoms are stable.  Echo and Lexiscan Myoview are unremarkable 11/2017.  We discussed increasing his exercise.  #  Ascending aorta aneurysm: 4.1 cm on 11/2017 by echocardiogram.  Unchanged from 2016.  Repeat in two years.  # Essential hypertension:  Blood pressure is nearly at goal.  He is going to work on increasing his exercise.  Continue bisoprolol, hydrochlorothiazide, doxazosin, and losartan.  # Hyperlipidemia:  Continue atorvastatin.  Lipids are well-controlled.  LDL was 63 on 12/2018.  Current medicines are reviewed at length with the patient today.  The patient does not have concerns regarding medicines.  The following changes have been made:  no change  Labs/ tests ordered today include:   No orders of the defined types were placed in this encounter.    Disposition:   FU with Savannah Morford C. Oval Linsey, MD, [facility] in 2 years.    Signed, Daveigh Batty C. Oval Linsey, MD, [facility]  12/30/2018 8:27 AM    [facility]

## 2018-12-30 NOTE — Patient Instructions (Addendum)
Medication Instructions:  Your physician recommends that you continue on your current medications as directed. Please refer to the Current Medication list given to you today.  *If you need a refill on your cardiac medications before your next appointment, please call your pharmacy*  Lab Work: NONE   Testing/Procedures: Your physician has requested that you have an echocardiogram. Echocardiography is a painless test that uses sound waves to create images of your heart. It provides your doctor with information about the size and shape of your heart and how well your heart's chambers and valves are working. This procedure takes approximately one hour. There are no restrictions for this procedure.  IN 2 YEARS AFTER YOUR ECHO   Follow-Up: At [facility], you and your health needs are our priority.  As part of our continuing mission to provide you with exceptional heart care, we have created designated Provider Care Teams.  These Care Teams include your primary Cardiologist (physician) and Advanced Practice Providers (APPs -  Physician Assistants and Nurse Practitioners) who all work together to provide you with the care you need, when you need it.  Your next appointment:   24 months  AFTER ECHO You will receive a reminder letter in the mail two months in advance. If you don't receive a letter, please call our office to schedule the follow-up appointment.  The format for your next appointment:   Either In Person or Virtual  Provider:   You may see  or one of the following Advanced Practice Providers on your designated Care Team:    Kerin Ransom, PA-C  [place], [place]  Coletta Memos, [place]

## 2019-01-02 ENCOUNTER — Other Ambulatory Visit: Payer: Self-pay | Admitting: Physician Assistant

## 2019-01-18 ENCOUNTER — Other Ambulatory Visit: Payer: Self-pay

## 2019-01-20 ENCOUNTER — Encounter: Payer: Self-pay | Admitting: Internal Medicine

## 2019-01-20 ENCOUNTER — Ambulatory Visit (INDEPENDENT_AMBULATORY_CARE_PROVIDER_SITE_OTHER): Payer: 59 | Admitting: Internal Medicine

## 2019-01-20 VITALS — BP 130/78 | HR 70 | Ht 74.0 in | Wt 258.0 lb

## 2019-01-20 DIAGNOSIS — E139 Other specified diabetes mellitus without complications: Secondary | ICD-10-CM | POA: Diagnosis not present

## 2019-01-20 DIAGNOSIS — E669 Obesity, unspecified: Secondary | ICD-10-CM | POA: Diagnosis not present

## 2019-01-20 DIAGNOSIS — E785 Hyperlipidemia, unspecified: Secondary | ICD-10-CM | POA: Diagnosis not present

## 2019-01-20 DIAGNOSIS — E1169 Type 2 diabetes mellitus with other specified complication: Secondary | ICD-10-CM | POA: Diagnosis not present

## 2019-01-20 LAB — POCT GLYCOSYLATED HEMOGLOBIN (HGB A1C): Hemoglobin A1C: 6.1 % — AB (ref 4.0–5.6)

## 2019-01-20 MED ORDER — FREESTYLE LIBRE 2 READER SYSTM DEVI: 1.0000 | Freq: Once | 0 refills | Status: AC

## 2019-01-20 MED ORDER — FREESTYLE LIBRE 2 SENSOR SYSTM MISC: 1.0000 | 3 refills | Status: DC

## 2019-01-20 MED ORDER — INSULIN GLARGINE 100 UNIT/ML ~~LOC~~ SOLN: 45.0000 [IU] | Freq: Every day | SUBCUTANEOUS | 3 refills | Status: DC

## 2019-01-20 NOTE — Progress Notes (Signed)
Patient ID: Harry [name], male   DOB: January 19, 1956, 63 y.o.   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 Last visit 4 months ago.  Earlier in the year, his sugars increased  after his wife passed away from cancer.  He started to work on during the coronavirus pandemic.  Sugars were higher at last visit.  Last hemoglobin A1c was: Lab Results  Component Value Date   HGBA1C 6.9 (A) 09/20/2018   HGBA1C 6.9 (H) 06/14/2018   HGBA1C 7.2 (A) 05/25/2018   He is on:  Metformin ER 1000 mg 2x a day   Trulicity 1.5 mg weekly  Humalog 12-16-18 units before meals, occasionally 5-10 units at bedtime  Lantus 30 - 30 >> 50 units in a.m.  Pt checks his sugars 4x a day: - am:  82-140 >>  95-145 >> 150-210, 235 >> 90-150 - 2h after b'fast: 180-313 >> n/c >> 165 >> n/c - before lunch:  164-325 >> n/c >> 70-150 >> 40, 80-120, 130, 170 - 2h after lunch: n/c >> 354, 362 >> 72 >> n/c - before dinner: n/c >> n/c  >> 165-235 >> n/c - 2h after dinner: 117, 169-440 >>> n/c  - bedtime: 120-200, 300 >> 97-160 >> 130-200 >> 150-180 - nighttime: n/c >>> 66 >> n/c Lowest sugar was 30s  ... >> 87 >> 70 >> 40 (took insulin and was active); he has hypoglycemia awareness in the 60s. Highest sugar was in the 350 >> 300s.  Glucometer: one touch ultra 2  Pt's meals are: - Breakfast: Glucerna, banana >> oatmeal - Lunch: eats out - salad with chicken; chicken burrito; sandwich; chick-fil-a >> sandwich with either tomatoes or peanut butter and jelly + hold milk - Dinner: meat + veggie + starch - Snacks: potato chips, fruit, icecream No sodas.  -+ Mild CKD, last BUN/creatinine:  Lab Results  Component Value Date   BUN 14 12/22/2018   BUN 19 09/21/2018   CREATININE 1.01 12/22/2018   CREATININE 0.86 09/21/2018  On losartan. -+ HL;  last set of lipids: Lab Results  Component Value Date   CHOL 138 12/22/2018   HDL 52 12/22/2018   LDLCALC 63 12/22/2018   TRIG 155 (H) 12/22/2018   CHOLHDL 2.7 12/22/2018  On Lipitor. - last eye exam was in 05/2017: No DR -Dr. Gershon Crane -No numbness and tingling in his feet.  He also has a history of HTN, GERD, OSA-on CPAP.  ROS: Constitutional: no weight gain/no weight loss, no fatigue, no subjective hyperthermia, no subjective hypothermia Eyes: no blurry vision, no xerophthalmia ENT: no sore throat, no nodules palpated in neck, no dysphagia, no odynophagia, no hoarseness Cardiovascular: no CP/no SOB/no palpitations/no leg swelling Respiratory: no cough/no SOB/no wheezing Gastrointestinal: no N/no V/no D/no C/no acid reflux Musculoskeletal: no muscle aches/no joint aches Skin: no rashes, no hair loss Neurological: no tremors/no numbness/no tingling/no dizziness  I reviewed pt's medications, allergies, PMH, social hx, family hx, and changes were documented in the history of present illness. Otherwise, unchanged from my initial visit note.  Past Medical History:  Diagnosis Date  . CKD (chronic kidney disease) stage 2, GFR 60-89 ml/min    secondary to DM  . Diabetes mellitus without complication ([place]) A999333   insulin requiring  . GERD (gastroesophageal reflux disease)   . Hyperlipidemia   .  Hypertension   . Hypokalemia   . Hypomagnesemia   . OSA on CPAP    10-12 years ago  . Vitamin D deficiency    Past Surgical History:  Procedure Laterality Date  . ANAL FISSURE REPAIR  2007  . TONSILLECTOMY Bilateral    As a child  . VENTRAL HERNIA REPAIR  04/13/12   Dr. Johney Maine, ventral hernia repair w/ mesh   Social History   Social History  . Marital status: Married    Spouse name: N/A  . Number of children: 2   Occupational History  . estimator   Social History Main Topics  . Smoking status: Never Smoker  . Smokeless tobacco: Not on file  . Alcohol use 0.0 oz/week    2 -  5 Cans of beer per week   Current Outpatient Medications on File Prior to Visit  Medication Sig Dispense Refill  . aspirin 81 MG tablet Take 81 mg by mouth daily.    Marland Kitchen atorvastatin (LIPITOR) 40 MG tablet TAKE 1 TABLET DAILY FOR CHOLESTEROL 90 tablet 3  . BD INSULIN SYRINGE U/F 31G X 5/16" 0.5 ML MISC INJECT INSULIN TWICE A DAY AS DIRECTED 200 each 3  . bisoprolol-hydrochlorothiazide (ZIAC) 5-6.25 MG tablet TAKE 1 TABLET DAILY FOR BLOOD PRESSURE 90 tablet 4  . Cholecalciferol (VITAMIN D) 125 MCG (5000 UT) CAPS Take 10,000 Units by mouth daily. 60 capsule 0  . doxazosin (CARDURA) 4 MG tablet Take 1 tablet (4 mg total) by mouth daily. 30 tablet 0  . glucose 4 GM chewable tablet Chew 1 tablet (4 g total) by mouth as needed for low blood sugar. 30 tablet 12  . insulin glargine (LANTUS) 100 UNIT/ML injection Inject 0.5 mLs (50 Units total) into the skin daily. 30 mL 3  . insulin lispro (HUMALOG) 100 UNIT/ML injection INJECT 60 TO 80 UNITS UNDER THE SKIN PER DAY WITH MEALS DEPENDING ON MEALS (Patient taking differently: Inject 15-25 Units into the skin 3 (three) times daily with meals. ) 210 mL 4  . losartan-hydrochlorothiazide (HYZAAR) 100-12.5 MG tablet Take 1 tablet Daily for BP 90 tablet 3  . Magnesium 500 MG TABS Take 1 tablet by mouth daily.    . metFORMIN (GLUCOPHAGE-XR) 500 MG 24 hr tablet Take 2 tablets 2 x  /day with Meals for Diabetes 360 tablet 3  . omeprazole (PRILOSEC) 20 MG capsule Take 1 capsule Daily for Indigestion & Heartburn 90 capsule 3  . ONE TOUCH ULTRA TEST test strip USE 3 TO 4 TIMES A DAY FOR FLUCTUATING BLOOD SUGARS 400 each 3  . potassium chloride SA (KLOR-CON M20) 20 MEQ tablet Takke 1 tablet 2 x /day for Potassium 180 tablet 3  . tadalafil (CIALIS) 20 MG tablet Take 1 tablet (20 mg total) by mouth daily as needed for erectile dysfunction. 60 tablet 3  . TRULICITY 1.5 0000000 SOPN INJECT 1.5 MG UNDER THE SKIN ONCE A WEEK 6 mL 4   No current facility-administered  medications on file prior to visit.    Allergies  Allergen Reactions  . Crestor [Rosuvastatin] Cough   Family History  Problem Relation Age of Onset  . Kidney cancer Father 76       kidney  . Stroke Maternal Grandmother   . Dementia Paternal Grandmother        Early 40s  . Cancer Paternal Wynell Balloon of age or type  . Cancer Paternal Uncle 59    PE: BP 130/78  Pulse 70   Ht 6\' 2"  (1.88 m)   Wt 258 lb (117 kg)   SpO2 96%   BMI 33.13 kg/m  Wt Readings from Last 3 Encounters:  01/20/19 258 lb (117 kg)  12/30/18 258 lb (117 kg)  12/22/18 258 lb 9.6 oz (117.3 kg)   Constitutional: overweight, in NAD Eyes: PERRLA, EOMI, no exophthalmos ENT: moist mucous membranes, no thyromegaly, no cervical lymphadenopathy Cardiovascular: RRR, No MRG Respiratory: CTA B Gastrointestinal: abdomen soft, NT, ND, BS+ Musculoskeletal: no deformities, strength intact in all 4 Skin: moist, warm, no rashes Neurological: no tremor with outstretched hands, DTR normal in all 4  ASSESSMENT: 1. LADA, insulin-dependent, uncontrolled, with complications - CKD stage 2 - ED  - he is interested in an insulin pump  - he has an elliptical machine at home  Component     Latest Ref Rng & Units 07/11/2016  Hemoglobin A1C      6.5  Glutamic Acid Decarb Ab     <5 IU/mL 47 (H)  Pancreatic Islet Cell Antibody     <5 JDF Units <5  C-Peptide     0.80 - 3.85 ng/mL 0.37 (L)  Glucose, Fasting     65 - 99 mg/dL 104 (H)   Elevated GAD antibodies and decreased insulin production. 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 HL  3.  Obesity class I BMI Classification:  < 18.5 underweight   18.5-24.9 normal weight   25.0-29.9 overweight   30.0-34.9 class I obesity   35.0-39.9 class II obesity   ? 40.0 class III obesity   PLAN:  1. Patient with longstanding, previously uncontrolled,  insulin-dependent diabetes (LADA), with improved sugars in the past after he reviewed snacking.  He even developed low blood sugars in the 50s and 60s and we had to decrease his Lantus dose.  In the past he had problems forgetting his insulin and his Trulicity doses but he improved this at last visit.  HbA1c then was stable, at goal, at 6.9%.  However, sugars are higher after lunch so we increased the insulin with this meal.  I also advised him to stop meal since this could also raise his sugar significantly.  We also discussed that if the sugars remain high to add a low-dose of Lantus at bedtime.   -At this visit, sugars are improved, but still fluctuating, and usually lower in the middle of the day.  Today, he had a low blood sugar at 40 after he took his Humalog dose in the morning, ate oatmeal, and then took his dog to the vet.  He was surprised by the reading and he thinks that he actually took a larger dose of Humalog this morning, up to twenty.  We discussed about sticking with lower doses of Humalog in the morning, actually 10 to 12 units before breakfast but later in the day he could continue 15 to 20 units.  I will also advised him to decrease the Lantus to 45 units each morning. -We also discussed about trying to increase the dose of Trulicity from 1.5 to 3 mg weekly, while decreasing the dose of Humalog, but he has several weeks of Trulicity supplies at home and I advised him to let me know when he is close to running out so I can call in the higher dose. - I advised him to  to:  Patient Instructions  Please continue: -  Metformin ER 1000 mg 2x a day with meals - Trulicity 1.5 mg weekly  Please decrease:   - Humalog  10-12 units before b'fast 15-20 units before lunch and dinner   - Lantus to 45 units in a.m.  Please return in 4 months with your sugar log.     - we checked his HbA1c: 6.1% (better) - advised to check sugars at different times of the day - 1x a day, rotating check times -  advised to schedule annual eye exam as he is not UTD - return to clinic in 4 months   2. HL -Reviewed latest lipid panel from 12/2018: LDL at goal, triglycerides slightly high, HDL at goal Lab Results  Component Value Date   CHOL 138 12/22/2018   HDL 52 12/22/2018   LDLCALC 63 12/22/2018   TRIG 155 (H) 12/22/2018   CHOLHDL 2.7 12/22/2018  -Continues Lipitor without side effects.  3.  Obesity class I -Continue Trulicity which should also help with weight loss and we are planning to increase the dose when he runs out of the current supply -Gained 4 pounds since last visit   [name], MD PhD [facility]

## 2019-01-20 NOTE — Patient Instructions (Addendum)
Please continue: - Metformin ER 1000 mg 2x a day with meals - Trulicity 1.5 mg weekly  Please decrease:   - Humalog  10-12 units before b'fast 15-20 units before lunch and dinner   - Lantus to 45 units in a.m.  Please return in 4 months with your sugar log.

## 2019-03-24 ENCOUNTER — Ambulatory Visit: Payer: 59 | Admitting: Adult Health

## 2019-03-28 NOTE — Progress Notes (Signed)
3 MONTH FOLLOW UP   Assessment and Plan:   Essential hypertension - continue medications, DASH diet, exercise and monitor at home. Call if greater than 130/80.  -     CBC with Differential/Platelet -     CMP/GFR -     TSH  Aneurysm, ascending aorta ([place]) Follows with cardiology; follow up imaging due fall 2021 Control blood pressure, cholesterol, glucose, increase exercise.   Obesity hypoventilation syndrome (H[facility]) Continue CPAP, got a new one. Weight loss encouraged  LADA managed as T2DM ([place]) Managed by Dr. Cruzita [name], recentlly very well controlled Discussed general issues about diabetes pathophysiology and management., Educational material distributed., Suggested low cholesterol diet., Encouraged aerobic exercise., Discussed foot care., Reminded to get yearly retinal exam.  CKD stage 2 due to type 2 diabetes mellitus ([place]) Increase fluids, avoid NSAIDS, monitor sugars, will monitor CMP/GFR  Erectile dysfunction associated with type 2 diabetes mellitus ([place]) Glucose control recently improved, managing well with cialis PRN  Hyperlipidemia, unspecified hyperlipidemia type -continue medications for LDL goal <70, check lipids, decrease fatty foods, increase activity.  -     Lipid panel  Obesity with body mass index (BMI) of 30.0 to 34.9 - follow up 3 months for progress monitoring - increase veggies, decrease carbs - long discussion about weight loss, diet, and exercise  Medication management -     Magnesium  Discussed med's effects and SE's. Screening labs and tests as requested with regular follow-up as recommended. Future Appointments  Date Time Provider [place]  05/26/2019  4:00 PM Harry Kingdom, MD [facility] None  09/29/2019 10:00 AM Harry Comber, NP [facility] None    HPI Patient presents for 3 month follow up for HTN, chol, DM with CKD, GERD, obesity, AAA.   He is newly following with Dr. Karsten Ro due to hematuria and elevated PSAs, plans for q87m  PSA checks at his office for close monitoring.   Wife passed away from cancer in 2018/05/20 from breast cancer recurrence. He has 2 children, 2 grandkids. He works from office doing estimates. He feels he is doing well and managing.   He has OSA on CPAP, also with obesity hypoventilation syndrome and followed by neurology; he endorses 100% compliance with restorative sleep.    BMI is Body mass index is 32.92 kg/m., he has been working on diet and exercise, has been trying to walk more but limited by weather, plans to do 30 min, is working on cutting down on snacking in the evening, keeping bad foods out of the house, trying to increase veggies and less pasta and bread.  Wt Readings from Last 3 Encounters:  03/29/19 256 lb 6.4 oz (116.3 kg)  01/20/19 258 lb (117 kg)  12/30/18 258 lb (117 kg)   He has known ascending aorta dilation, It was 4.2 cm in 2016, 4.1 cm per recent ECHO 11/2017 by cardiology. He had repeat stress test and ECHO in 11/2017 which were essentially normal by Dr. Sallyanne [name]. He is following with Harry Reap, PA with vascular.   His blood pressure has not been checked at home, today their BP is BP: 126/68 He does not workout. He denies chest pain, shortness of breath, dizziness.  He is on cholesterol medication, lipitor 40mg  and denies myalgias. His cholesterol is at goal. The cholesterol last visit was:   Lab Results  Component Value Date   CHOL 138 12/22/2018   HDL 52 12/22/2018   LDLCALC 63 12/22/2018   TRIG 155 (H) 12/22/2018   CHOLHDL 2.7 12/22/2018  He has been working on diet and exercise for insulin dependent diabetes with CKD stage 2, he has had DM x 30 + years, he is on bASA, he is on ACE/ARB, metformin, trulicity; he is now following with Dr. Cruzita [name], Dr. Gershon [name] eye exam and is still on lantus 45 units daily, and on humolog 15-20 units before meals depending on size, denies any low blood sugars, normally not taking any humolog at bed time and denies paresthesia of  the feet, polydipsia, polyuria and visual disturbances.   He reports fasting values range 80-160; no low sugars since insulin dose was recently adjusted  He takes cialis 20 mg PRN for ED  Last A1C in the office was:  Lab Results  Component Value Date   HGBA1C 6.1 (A) 01/20/2019   He has CKD II associated with T2DM monitored q55m at this office:  Lab Results  Component Value Date   [facility] 78 12/22/2018   Patient is on Vitamin D supplement, admits wasn't taking regularly, doing more consistently and taking 5000 IU daily .   Lab Results  Component Value Date   VD25OH 36 09/21/2018     Current Medications:  Current Outpatient Medications on File Prior to Visit  Medication Sig Dispense Refill  . aspirin 81 MG tablet Take 81 mg by mouth daily.    Marland Kitchen atorvastatin (LIPITOR) 40 MG tablet TAKE 1 TABLET DAILY FOR CHOLESTEROL 90 tablet 3  . BD INSULIN SYRINGE U/F 31G X 5/16" 0.5 ML MISC INJECT INSULIN TWICE A DAY AS DIRECTED 200 each 3  . bisoprolol-hydrochlorothiazide (ZIAC) 5-6.25 MG tablet TAKE 1 TABLET DAILY FOR BLOOD PRESSURE 90 tablet 4  . Cholecalciferol (VITAMIN D) 125 MCG (5000 UT) CAPS Take 10,000 Units by mouth daily. 60 capsule 0  . Continuous Blood Gluc Sensor (FREESTYLE LIBRE 2 SENSOR SYSTM) MISC 1 each by Does not apply route every 14 (fourteen) days. 6 each 3  . doxazosin (CARDURA) 4 MG tablet Take 1 tablet (4 mg total) by mouth daily. 30 tablet 0  . glucose 4 GM chewable tablet Chew 1 tablet (4 g total) by mouth as needed for low blood sugar. 30 tablet 12  . insulin glargine (LANTUS) 100 UNIT/ML injection Inject 0.45 mLs (45 Units total) into the skin daily. 30 mL 3  . insulin lispro (HUMALOG) 100 UNIT/ML injection INJECT 60 TO 80 UNITS UNDER THE SKIN PER DAY WITH MEALS DEPENDING ON MEALS (Patient taking differently: Inject 15-25 Units into the skin 3 (three) times daily with meals. ) 210 mL 4  . losartan-hydrochlorothiazide (HYZAAR) 100-12.5 MG tablet Take 1 tablet Daily for BP  90 tablet 3  . Magnesium 500 MG TABS Take 1 tablet by mouth daily.    . metFORMIN (GLUCOPHAGE-XR) 500 MG 24 hr tablet Take 2 tablets 2 x  /day with Meals for Diabetes 360 tablet 3  . omeprazole (PRILOSEC) 20 MG capsule Take 1 capsule Daily for Indigestion & Heartburn 90 capsule 3  . ONE TOUCH ULTRA TEST test strip USE 3 TO 4 TIMES A DAY FOR FLUCTUATING BLOOD SUGARS 400 each 3  . potassium chloride SA (KLOR-CON M20) 20 MEQ tablet Takke 1 tablet 2 x /day for Potassium 180 tablet 3  . tadalafil (CIALIS) 20 MG tablet Take 1 tablet (20 mg total) by mouth daily as needed for erectile dysfunction. 60 tablet 3  . TRULICITY 1.5 0000000 SOPN INJECT 1.5 MG UNDER THE SKIN ONCE A WEEK 6 mL 4   No current facility-administered medications on file prior  to visit.    Allergies:  Allergies  Allergen Reactions  . Crestor [Rosuvastatin] Cough   Medical History:  Past Medical History:  Diagnosis Date  . CKD (chronic kidney disease) stage 2, GFR 60-89 ml/min    secondary to DM  . Diabetes mellitus without complication ([place]) A999333   insulin requiring  . GERD (gastroesophageal reflux disease)   . Hyperlipidemia   . Hypertension   . Hypokalemia   . Hypomagnesemia   . OSA on CPAP    10-12 years ago  . Vitamin D deficiency    Surgical History: reviewed and unchanged Family History: reviewed and unchanged Social History: reviewed and unchanged  Review of Systems:  Review of Systems  Constitutional: Negative.  Negative for malaise/fatigue and weight loss.  HENT: Negative.  Negative for hearing loss and tinnitus.   Eyes: Negative.  Negative for blurred vision and double vision.  Respiratory: Negative for cough, hemoptysis, sputum production, shortness of breath and wheezing.   Cardiovascular: Negative.  Negative for chest pain, palpitations, orthopnea, claudication and leg swelling.  Gastrointestinal: Negative for abdominal pain, blood in stool, constipation, diarrhea, melena, nausea and vomiting.  Heartburn: better with PPI.  Genitourinary: Negative.   Musculoskeletal: Negative.  Negative for joint pain and myalgias.  Skin: Negative.  Negative for rash.  Neurological: Negative.  Negative for dizziness, tingling, sensory change, weakness and headaches.  Endo/Heme/Allergies: Negative.  Negative for polydipsia.  Psychiatric/Behavioral: Negative.   All other systems reviewed and are negative.   Physical Exam: Estimated body mass index is 32.92 kg/m as calculated from the following:   Height as of 01/20/19: 6\' 2"  (1.88 m).   Weight as of this encounter: 256 lb 6.4 oz (116.3 kg). BP 126/68   Pulse 73   Temp (!) 97.5 F (36.4 C)   Wt 256 lb 6.4 oz (116.3 kg)   SpO2 98%   BMI 32.92 kg/m  General Appearance: Well nourished, in no apparent distress.  Eyes: PERRLA, EOMs, conjunctiva no swelling or erythema Sinuses: No Frontal/maxillary tenderness  ENT/Mouth: Ext aud canals clear, normal light reflex with TMs without erythema, bulging. Mask in place; oral exam deferred. Hearing normal.  Neck: Supple, thyroid normal. No bruits  Respiratory: Respiratory effort normal, BS equal bilaterally without rales, rhonchi, wheezing or stridor.  Cardio: RRR without murmurs, rubs or gallops, decreased HS due to body habitus. Brisk peripheral pulses without edema.  Chest: symmetric, with normal excursions and percussion.  Abdomen: Soft, nontender, obese, + ventral hernia, no guarding, rebound, masses, or organomegaly.  Lymphatics: Non tender without lymphadenopathy.  Musculoskeletal: Full ROM all peripheral extremities,5/5 strength, and normal gait.  Skin: Warm, dry without rashes, lesions, ecchymosis. Neuro: Cranial nerves intact, reflexes equal bilaterally. Normal muscle tone, no cerebellar symptoms. Sensation intact.  Psych: Awake and oriented X 3, normal affect, Insight and Judgment appropriate.   Harry [name] 3:06 PM [facility]

## 2019-03-29 ENCOUNTER — Ambulatory Visit: Payer: 59 | Admitting: Adult Health

## 2019-03-29 ENCOUNTER — Other Ambulatory Visit: Payer: Self-pay

## 2019-03-29 ENCOUNTER — Encounter: Payer: Self-pay | Admitting: Adult Health

## 2019-03-29 VITALS — BP 126/68 | HR 73 | Temp 97.5°F | Wt 256.4 lb

## 2019-03-29 DIAGNOSIS — E662 Morbid (severe) obesity with alveolar hypoventilation: Secondary | ICD-10-CM | POA: Diagnosis not present

## 2019-03-29 DIAGNOSIS — N521 Erectile dysfunction due to diseases classified elsewhere: Secondary | ICD-10-CM

## 2019-03-29 DIAGNOSIS — E669 Obesity, unspecified: Secondary | ICD-10-CM

## 2019-03-29 DIAGNOSIS — Z79899 Other long term (current) drug therapy: Secondary | ICD-10-CM

## 2019-03-29 DIAGNOSIS — E785 Hyperlipidemia, unspecified: Secondary | ICD-10-CM

## 2019-03-29 DIAGNOSIS — G4733 Obstructive sleep apnea (adult) (pediatric): Secondary | ICD-10-CM

## 2019-03-29 DIAGNOSIS — N182 Chronic kidney disease, stage 2 (mild): Secondary | ICD-10-CM

## 2019-03-29 DIAGNOSIS — I10 Essential (primary) hypertension: Secondary | ICD-10-CM

## 2019-03-29 DIAGNOSIS — I712 Thoracic aortic aneurysm, without rupture: Secondary | ICD-10-CM

## 2019-03-29 DIAGNOSIS — E559 Vitamin D deficiency, unspecified: Secondary | ICD-10-CM | POA: Diagnosis not present

## 2019-03-29 DIAGNOSIS — E1122 Type 2 diabetes mellitus with diabetic chronic kidney disease: Secondary | ICD-10-CM

## 2019-03-29 DIAGNOSIS — E1169 Type 2 diabetes mellitus with other specified complication: Secondary | ICD-10-CM

## 2019-03-29 DIAGNOSIS — I7121 Aneurysm of the ascending aorta, without rupture: Secondary | ICD-10-CM

## 2019-03-29 DIAGNOSIS — E139 Other specified diabetes mellitus without complications: Secondary | ICD-10-CM

## 2019-03-29 NOTE — Patient Instructions (Addendum)
Goals    . Exercise 150 min/wk Moderate Activity    . Weight (lb) < 225 lb (102.1 kg)     Weigh in once a week and keep a log Cut back on snacking       Ask insurance about shingrix coverage - if they cover it can get at [facility] or [facility]     Drink 1/2 your body weight in fluid ounces of water daily; drink a tall glass of water 30 min before meals  Don't eat until you're stuffed- listen to your stomach and eat until you are 80% full   Try eating off of a salad plate; wait 10 min after finishing before going back for seconds  Start by eating the vegetables on your plate; aim for 50% of your meals to be fruits or vegetables  Then eat your protein - lean meats (grass fed if possible), fish, beans, nuts in moderation  Eat your carbs/starch last ONLY if you still are hungry. If you can, stop before finishing it all  Avoid sugar and flour - the closer it looks to it's original form in nature, typically the better it is for you  Splurge in moderation - "assign" days when you get to splurge and have the "bad stuff" - I like to follow a 80% - 20% plan- "good" choices 80 % of the time, "bad" choices in moderation 20% of the time  Simple equation is: Calories out > calories in = weight loss - even if you eat the bad stuff, if you limit portions, you will still lose weight     Bad carbs also include fruit juice, alcohol, and sweet tea. These are empty calories that do not signal to your brain that you are full.   Please remember the good carbs are still carbs which convert into sugar. So please measure them out no more than 1/2-1 cup of rice, oatmeal, pasta, and beans  Veggies are however free foods! Pile them on.   Not all fruit is created equal. Please see the list below, the fruit at the bottom is higher in sugars than the fruit at the top. Please avoid all dried fruits.

## 2019-03-30 ENCOUNTER — Other Ambulatory Visit: Payer: Self-pay | Admitting: Internal Medicine

## 2019-03-30 LAB — LIPID PANEL
Cholesterol: 148 mg/dL (ref <200)
HDL: 49 mg/dL (ref >=40)
LDL Cholesterol (Calc): 71 mg/dL (calc)
Non-HDL Cholesterol (Calc): 99 mg/dL (calc) (ref <130)
Total CHOL/HDL Ratio: 3 (calc) (ref <5.0)
Triglycerides: 224 mg/dL — ABNORMAL HIGH (ref <150)

## 2019-03-30 LAB — CBC WITH DIFFERENTIAL/PLATELET
Absolute Monocytes: 684 cells/uL (ref 200–950)
Basophils Absolute: 93 cells/uL (ref 0–200)
Basophils Relative: 0.8 %
Eosinophils Absolute: 186 cells/uL (ref 15–500)
Eosinophils Relative: 1.6 %
HCT: 45.8 % (ref 38.5–50.0)
Hemoglobin: 15.1 g/dL (ref 13.2–17.1)
Lymphs Abs: 1056 cells/uL (ref 850–3900)
MCH: 28.5 pg (ref 27.0–33.0)
MCHC: 33 g/dL (ref 32.0–36.0)
MCV: 86.4 fL (ref 80.0–100.0)
MPV: 10.8 fL (ref 7.5–12.5)
Monocytes Relative: 5.9 %
Neutro Abs: 9582 cells/uL — ABNORMAL HIGH (ref 1500–7800)
Neutrophils Relative %: 82.6 %
Platelets: 223 10*3/uL (ref 140–400)
RBC: 5.3 10*6/uL (ref 4.20–5.80)
RDW: 13 % (ref 11.0–15.0)
Total Lymphocyte: 9.1 %
WBC: 11.6 10*3/uL — ABNORMAL HIGH (ref 3.8–10.8)

## 2019-03-30 LAB — COMPLETE METABOLIC PANEL WITH GFR
AG Ratio: 1.8 (calc) (ref 1.0–2.5)
ALT: 24 U/L (ref 9–46)
AST: 20 U/L (ref 10–35)
Albumin: 4 g/dL (ref 3.6–5.1)
Alkaline phosphatase (APISO): 80 U/L (ref 35–144)
BUN: 14 mg/dL (ref 7–25)
CO2: 26 mmol/L (ref 20–32)
Calcium: 9.3 mg/dL (ref 8.6–10.3)
Chloride: 99 mmol/L (ref 98–110)
Creat: 1.04 mg/dL (ref 0.70–1.25)
GFR, Est African American: 87 mL/min/{1.73_m2} (ref >=60)
GFR, Est Non African American: 75 mL/min/{1.73_m2} (ref >=60)
Globulin: 2.2 g/dL (calc) (ref 1.9–3.7)
Glucose, Bld: 271 mg/dL — ABNORMAL HIGH (ref 65–99)
Potassium: 3.8 mmol/L (ref 3.5–5.3)
Sodium: 137 mmol/L (ref 135–146)
Total Bilirubin: 0.8 mg/dL (ref 0.2–1.2)
Total Protein: 6.2 g/dL (ref 6.1–8.1)

## 2019-03-30 LAB — TSH: TSH: 1.69 mIU/L (ref 0.40–4.50)

## 2019-03-30 LAB — MAGNESIUM: Magnesium: 1.8 mg/dL (ref 1.5–2.5)

## 2019-03-30 LAB — VITAMIN D 25 HYDROXY (VIT D DEFICIENCY, FRACTURES): Vit D, 25-Hydroxy: 55 ng/mL (ref 30–100)

## 2019-04-02 ENCOUNTER — Other Ambulatory Visit: Payer: Self-pay | Admitting: Internal Medicine

## 2019-05-26 ENCOUNTER — Encounter: Payer: Self-pay | Admitting: Internal Medicine

## 2019-05-26 ENCOUNTER — Other Ambulatory Visit: Payer: Self-pay

## 2019-05-26 ENCOUNTER — Ambulatory Visit (INDEPENDENT_AMBULATORY_CARE_PROVIDER_SITE_OTHER): Payer: 59 | Admitting: Internal Medicine

## 2019-05-26 VITALS — BP 130/90 | HR 78 | Ht 74.0 in | Wt 258.0 lb

## 2019-05-26 DIAGNOSIS — E785 Hyperlipidemia, unspecified: Secondary | ICD-10-CM | POA: Diagnosis not present

## 2019-05-26 DIAGNOSIS — E139 Other specified diabetes mellitus without complications: Secondary | ICD-10-CM

## 2019-05-26 DIAGNOSIS — E669 Obesity, unspecified: Secondary | ICD-10-CM | POA: Diagnosis not present

## 2019-05-26 DIAGNOSIS — E1169 Type 2 diabetes mellitus with other specified complication: Secondary | ICD-10-CM

## 2019-05-26 LAB — POCT GLYCOSYLATED HEMOGLOBIN (HGB A1C): Hemoglobin A1C: 7.4 % — AB (ref 4.0–5.6)

## 2019-05-26 MED ORDER — TRULICITY 3 MG/0.5ML ~~LOC~~ SOAJ: 3.0000 mg | SUBCUTANEOUS | 3 refills | Status: DC

## 2019-05-26 NOTE — Addendum Note (Signed)
Addended by: Cardell Peach I on: 05/26/2019 04:31 PM   Modules accepted: Orders

## 2019-05-26 NOTE — Patient Instructions (Addendum)
Please continue: - Metformin ER 1000 mg 2x a day with meals - Lantus 45 units but move the dose to lunchtime  Please increase: - Trulicity 3 mg weekly  Please change:  - Humalog  10-20 units before b'fast 15-20 units before lunch 20-25 units before dinner  Please return in 4 months with your sugar log.

## 2019-05-26 NOTE — Progress Notes (Signed)
Patient ID: Harry [name], male   DOB: [birth year], 64 y.o.   MRN: [id number]   This visit occurred during the SARS-CoV-2 public health emergency.  Safety protocols were in place, including screening questions prior to the visit, additional usage of staff PPE, and extensive cleaning of exam room while observing appropriate contact time as indicated for disinfecting solutions.   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 Last visit 4 months ago.  Last year, his sugars increased  after his wife passed away from cancer.  He did relax his diet more after his visit as sugars are higher.  Reviewed HbA1c levels: Lab Results  Component Value Date   HGBA1C 6.1 (A) 01/20/2019   HGBA1C 6.9 (A) 09/20/2018   HGBA1C 6.9 (H) 06/14/2018   He is on:  Metformin ER 1000 mg 2x a day   Trulicity 1.5 mg weekly  Humalog  10-12 units before b'fast 15-20 units before lunch and dinner 5 units at bedtime if >250  Lantus 30 - 30 >> 50 >> 45 units in a.m.  Pt checks his sugars 4 times a day - am:   95-145 >> 150-210, 235 >> 90-150 >> 79-236 - 2h after b'fast: 180-313 >> n/c >> 165 >> n/c >>370,  387 - before lunch:  70-150 >> 40, 80-120, 130, 170 >> 45, 81-145 - 2h after lunch: n/c >> 354, 362 >> 72 >> n/c - before dinner: n/c >> n/c  >> 165-235 >> n/c - 2h after dinner: 117, 169-440 >>> n/c >> 234-365 - bedtime:  97-160 >> 130-200 >> 150-180 >> 222-438 - nighttime: n/c >>> 66 >> n/c Lowest sugar was 30s  ... >> 87 >> 70 >> 40 (took insulin and was active) >> 45 before lunch (overdose with b'fast: 25 units); he has hypoglycemia awareness in the 60s. Highest sugar was in the 350 >> 300s >> .  Glucometer: one touch ultra 2  Pt's meals are: - Breakfast: Glucerna, banana >> oatmeal - Lunch: eats out - salad with chicken; chicken burrito;  sandwich; chick-fil-a >> sandwich with either tomatoes or peanut butter and jelly + hold milk - Dinner: meat + veggie + starch - Snacks: potato chips, fruit, icecream No sodas.  -+ Mild CKD, last BUN/creatinine:  Lab Results  Component Value Date   BUN 14 03/29/2019   BUN 14 12/22/2018   CREATININE 1.04 03/29/2019   CREATININE 1.01 12/22/2018  On losartan. -+ HL; last set of lipids: Lab Results  Component Value Date   CHOL 148 03/29/2019   HDL 49 03/29/2019   LDLCALC 71 03/29/2019   TRIG 224 (H) 03/29/2019   CHOLHDL 3.0 03/29/2019  On Lipitor. - last eye exam was in 05/2017: No DR -Dr. Gershon Crane -No numbness and tingling in his feet.  He also has a history of HTN, GERD, OSA-on CPAP.  ROS: Constitutional: no weight gain/no weight loss, no fatigue, no subjective hyperthermia, no subjective hypothermia Eyes: no blurry vision, no xerophthalmia ENT: no sore throat, no nodules palpated in neck, no dysphagia, no odynophagia, no hoarseness Cardiovascular: no CP/no SOB/no palpitations/no leg swelling Respiratory: no cough/no SOB/no wheezing Gastrointestinal: no N/no V/no D/no C/no acid reflux Musculoskeletal: no muscle aches/no joint aches Skin: no rashes, no hair loss Neurological: no tremors/no numbness/no tingling/no dizziness  I reviewed pt's medications, allergies, PMH, social hx, family hx, and  changes were documented in the history of present illness. Otherwise, unchanged from my initial visit note.  Past Medical History:  Diagnosis Date  . CKD (chronic kidney disease) stage 2, GFR 60-89 ml/min    secondary to DM  . Diabetes mellitus without complication ([place]) [id number]   insulin requiring  . GERD (gastroesophageal reflux disease)   . Hyperlipidemia   . Hypertension   . Hypokalemia   . Hypomagnesemia   . OSA on CPAP    10-12 years ago  . Vitamin D deficiency    Past Surgical History:  Procedure Laterality Date  . ANAL FISSURE REPAIR  2007  . TONSILLECTOMY Bilateral     As a child  . VENTRAL HERNIA REPAIR  04/13/12   Dr. Johney Maine, ventral hernia repair w/ mesh   Social History   Social History  . Marital status: Married    Spouse name: N/A  . Number of children: 2   Occupational History  . estimator   Social History Main Topics  . Smoking status: Never Smoker  . Smokeless tobacco: Not on file  . Alcohol use 0.0 oz/week    2 - 5 Cans of beer per week   Current Outpatient Medications on File Prior to Visit  Medication Sig Dispense Refill  . aspirin 81 MG tablet Take 81 mg by mouth daily.    Marland Kitchen atorvastatin (LIPITOR) 40 MG tablet TAKE 1 TABLET DAILY FOR CHOLESTEROL 90 tablet 3  . BD INSULIN SYRINGE U/F 31G X 5/16" 0.5 ML MISC INJECT INSULIN TWICE A DAY AS DIRECTED 200 each 3  . bisoprolol-hydrochlorothiazide (ZIAC) 5-6.25 MG tablet Take 1 tablet Daily for BP 90 tablet 3  . Cholecalciferol (VITAMIN D) 125 MCG (5000 UT) CAPS Take 10,000 Units by mouth daily. 60 capsule 0  . Continuous Blood Gluc Sensor (FREESTYLE LIBRE 2 SENSOR SYSTM) MISC 1 each by Does not apply route every 14 (fourteen) days. 6 each 3  . doxazosin (CARDURA) 4 MG tablet Take 1 tablet (4 mg total) by mouth daily. 30 tablet 0  . glucose 4 GM chewable tablet Chew 1 tablet (4 g total) by mouth as needed for low blood sugar. 30 tablet 12  . insulin lispro (HUMALOG) 100 UNIT/ML injection 10-12 units before breakfast 15-20 units before lunch and dinner 210 mL 3  . LANTUS 100 UNIT/ML injection INJECT 50 UNITS UNDER THE SKIN DAILY 50 mL 3  . losartan-hydrochlorothiazide (HYZAAR) 100-12.5 MG tablet Take 1 tablet Daily for BP 90 tablet 3  . Magnesium 500 MG TABS Take 1 tablet by mouth daily.    . metFORMIN (GLUCOPHAGE-XR) 500 MG 24 hr tablet Take 2 tablets 2 x  /day with Meals for Diabetes 360 tablet 3  . omeprazole (PRILOSEC) 20 MG capsule Take 1 capsule Daily for Indigestion & Heartburn 90 capsule 3  . ONE TOUCH ULTRA TEST test strip USE 3 TO 4 TIMES A DAY FOR FLUCTUATING BLOOD SUGARS 400 each  3  . potassium chloride SA (KLOR-CON M20) 20 MEQ tablet Takke 1 tablet 2 x /day for Potassium 180 tablet 3  . tadalafil (CIALIS) 20 MG tablet Take 1 tablet (20 mg total) by mouth daily as needed for erectile dysfunction. 60 tablet 3  . TRULICITY 1.5 0000000 SOPN INJECT 1.5 MG UNDER THE SKIN ONCE A WEEK 6 mL 4   No current facility-administered medications on file prior to visit.   Allergies  Allergen Reactions  . Crestor [Rosuvastatin] Cough   Family History  Problem Relation Age of Onset  .  Kidney cancer Father 61       kidney  . Stroke Maternal Grandmother   . Dementia Paternal Grandmother        Early 37s  . Cancer Paternal Wynell Balloon of age or type  . Cancer Paternal Uncle 20    PE: BP 130/90   Pulse 78   Ht 6\' 2"  (1.88 m)   Wt 258 lb (117 kg)   SpO2 98%   BMI 33.13 kg/m  Wt Readings from Last 3 Encounters:  05/26/19 258 lb (117 kg)  03/29/19 256 lb 6.4 oz (116.3 kg)  01/20/19 258 lb (117 kg)   Constitutional: overweight, in NAD Eyes: PERRLA, EOMI, no exophthalmos ENT: moist mucous membranes, no thyromegaly, no cervical lymphadenopathy Cardiovascular: RRR, No MRG Respiratory: CTA B Gastrointestinal: abdomen soft, NT, ND, BS+ Musculoskeletal: no deformities, strength intact in all 4 Skin: moist, warm, no rashes Neurological: no tremor with outstretched hands, DTR normal in all 4  ASSESSMENT: 1. LADA, insulin-dependent, uncontrolled, with complications - CKD stage 2 - ED  - he is interested in an insulin pump  - he has an elliptical machine at home  Component     Latest Ref Rng & Units 07/11/2016  Hemoglobin A1C      6.5  Glutamic Acid Decarb Ab     <5 IU/mL 47 (H)  Pancreatic Islet Cell Antibody     <5 JDF Units <5  C-Peptide     0.80 - 3.85 ng/mL 0.37 (L)  Glucose, Fasting     65 - 99 mg/dL 104 (H)   Elevated GAD antibodies and decreased insulin production. 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 HL  3.  Obesity class I BMI Classification:  < 18.5 underweight   18.5-24.9 normal weight   25.0-29.9 overweight   30.0-34.9 class I obesity   35.0-39.9 class II obesity   ? 40.0 class III obesity   PLAN:  1. Patient with longstanding, previously uncontrolled, insulin-dependent diabetes (LADA), with improved sugars in the past after he stopped snacking.  He will developed low blood sugars in the 50s and 60s and we had to decrease his Lantus dose.  At last visit sugars were better, but still fluctuating and usually lower in the middle of the day.  Lowest sugar was in the 40s after he took Humalog dose in the morning, ate oatmeal and then was active during the morning.  We discussed about taking a lower dose of Humalog before breakfast but a higher dose later in the day.  We also decreased his Lantus to 45 units in the morning.  We also discussed about possibly increasing Trulicity dose but he had a full supply of Trulicity 1.5 mg at home. -At this visit, sugars are higher, both in the morning, and at bedtime.  At bedtime he even has sugars in the 400s.  I suspect that this is due to relaxing his diet and we discussed about cutting out snacks and improving his meals.  He had 1 low blood sugar in the middle of the day after he took 25 units of insulin with breakfast.  At last visit I advised him to reduce the dose of insulin with breakfast to 10 to 12 units.  We discussed that she should not take more than may be 20 units, and only if his sugars are very high in  the morning. -The dose of Lantus appears to be adequate but I advised him to move this in the middle of the day as it appears to peak midday for him now, after he takes it in the morning. -We will also increase the Humalog with dinner since his sugars after dinner are quite high -We will also increase his Trulicity dose at this visit. - I advised him to :  Patient Instructions   Please continue: - Metformin ER 1000 mg 2x a day with meals - Lantus 45 units but move the dose to lunchtime  Please increase: - Trulicity 3 mg weekly  Please change:  - Humalog  10-20 units before b'fast 15-20 units before lunch 20-25 units before dinner  Please return in 4 months with your sugar log.     - we checked his HbA1c: 7.4% (higher) - advised to check sugars at different times of the day - 4x a day, rotating check times - advised for yearly eye exams >> he is not UTD - return to clinic in 4 months   2. HL -Reviewed latest lipid panel from 03/2019: LDL at goal, triglycerides slightly high: Lab Results  Component Value Date   CHOL 148 03/29/2019   HDL 49 03/29/2019   LDLCALC 71 03/29/2019   TRIG 224 (H) 03/29/2019   CHOLHDL 3.0 03/29/2019  -Continues Lipitor without side effects.  3.  Obesity class I -Continue Trulicity which should also help with weight loss -He gained 4 pounds before last visit, weight stable since last OV   [name], MD PhD [facility] Endocrinology

## 2019-06-02 ENCOUNTER — Other Ambulatory Visit: Payer: Self-pay | Admitting: Internal Medicine

## 2019-06-02 MED ORDER — ATORVASTATIN CALCIUM 40 MG PO TABS: ORAL_TABLET | ORAL | 3 refills | Status: DC

## 2019-06-06 ENCOUNTER — Other Ambulatory Visit: Payer: Self-pay | Admitting: Internal Medicine

## 2019-07-20 ENCOUNTER — Other Ambulatory Visit: Payer: Self-pay | Admitting: Internal Medicine

## 2019-09-23 ENCOUNTER — Other Ambulatory Visit: Payer: Self-pay

## 2019-09-23 ENCOUNTER — Encounter: Payer: Self-pay | Admitting: Internal Medicine

## 2019-09-23 ENCOUNTER — Ambulatory Visit (INDEPENDENT_AMBULATORY_CARE_PROVIDER_SITE_OTHER): Payer: 59 | Admitting: Internal Medicine

## 2019-09-23 VITALS — BP 138/80 | HR 72 | Ht 74.0 in | Wt 253.0 lb

## 2019-09-23 DIAGNOSIS — E669 Obesity, unspecified: Secondary | ICD-10-CM

## 2019-09-23 DIAGNOSIS — E785 Hyperlipidemia, unspecified: Secondary | ICD-10-CM | POA: Diagnosis not present

## 2019-09-23 DIAGNOSIS — E1069 Type 1 diabetes mellitus with other specified complication: Secondary | ICD-10-CM

## 2019-09-23 DIAGNOSIS — E1065 Type 1 diabetes mellitus with hyperglycemia: Secondary | ICD-10-CM | POA: Diagnosis not present

## 2019-09-23 DIAGNOSIS — E1169 Type 2 diabetes mellitus with other specified complication: Secondary | ICD-10-CM

## 2019-09-23 DIAGNOSIS — E139 Other specified diabetes mellitus without complications: Secondary | ICD-10-CM

## 2019-09-23 LAB — POCT GLYCOSYLATED HEMOGLOBIN (HGB A1C): Hemoglobin A1C: 6.9 % — AB (ref 4.0–5.6)

## 2019-09-23 MED ORDER — INSULIN LISPRO 100 UNIT/ML ~~LOC~~ SOLN: SUBCUTANEOUS | 3 refills | Status: DC

## 2019-09-23 MED ORDER — FREESTYLE TEST VI STRP: ORAL_STRIP | 3 refills | Status: AC

## 2019-09-23 MED ORDER — INSULIN GLARGINE 100 UNIT/ML ~~LOC~~ SOLN: 35.0000 [IU] | Freq: Every day | SUBCUTANEOUS | 3 refills | Status: DC

## 2019-09-23 MED ORDER — FREESTYLE LIBRE 14 DAY SENSOR MISC: 3 refills | Status: DC

## 2019-09-23 NOTE — Progress Notes (Signed)
Patient ID: Harry [name], male   DOB: Apr 18, 1955, 64 y.o.   MRN: [id number]   This visit occurred during the SARS-CoV-2 public health emergency.  Safety protocols were in place, including screening questions prior to the visit, additional usage of staff PPE, and extensive cleaning of exam room while observing appropriate contact time as indicated for disinfecting solutions.   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 Last visit 4 months ago.  Reviewed HbA1c levels: Lab Results  Component Value Date   HGBA1C 7.4 (A) 05/26/2019   HGBA1C 6.1 (A) 01/20/2019   HGBA1C 6.9 (A) 09/20/2018   He is on:  Metformin ER 1000 mg 2x a day   Trulicity 1.5 >> 3 mg weekly  Humalog  10-12 units before b'fast 15-20 >> 12-15 units before lunch  15-20 >> 20-25 >> 12-15 units before dinner 5 units at bedtime if >250  Lantus 30 - 30 >> 50 >> 45 >> 40 units in a.m. (forgot to move this to lunchtime)  Pt checks his sugars >4 times a day with his freestyle libre CGM: Pt.checks his sugars more than 4 times a day with his CGM.  Freestyle libre CGM parameters: - Average: 130 - % active CGM time: 88% of the time - Glucose variability 45.9% (target < or = to 36%) - Glucose management indicator: 6.4% - time in range:  - very low (<54): 8% - low (54-69): 9% - normal range (70-180): 63% - high sugars (181-250): 15% - very high sugars (>250): 5%     Previously: - am:   95-145 >> 150-210, 235 >> 90-150 >> 79-236 - 2h after b'fast: 180-313 >> n/c >> 165 >> n/c >>370,  387 - before lunch:  70-150 >> 40, 80-120, 130, 170 >> 45, 81-145 - 2h after lunch: n/c >> 354, 362 >> 72 >> n/c - before dinner: n/c >> n/c  >> 165-235 >> n/c - 2h after dinner: 117, 169-440 >>> n/c >> 234-365 - bedtime:  97-160 >> 130-200 >> 150-180 >> 222-438 - nighttime:  n/c >>> 66 >> n/c Lowest sugar was 30s  ... >> 87 >> 70 >> 40 (took insulin and was active) >> 45 before lunch (overdose with b'fast: 25 units) >> 40 (sensor); he  has hypoglycemia awareness in the 60s. Highest sugar was in the 350 >> 300s >> 265.  Glucometer: one touch ultra 2  Pt's meals are: - Breakfast: Glucerna, banana >> oatmeal - Lunch: eats out - salad with chicken; chicken burrito; sandwich; chick-fil-a >> sandwich with either tomatoes or peanut butter and jelly + hold milk - Dinner: meat + veggie + starch - Snacks: potato chips, fruit, icecream No sodas.  -+ Mild CKD, last BUN/creatinine:  Lab Results  Component Value Date   BUN 14 03/29/2019   BUN 14 12/22/2018   CREATININE 1.04 03/29/2019   CREATININE 1.01 12/22/2018  On losartan. -+ HL; last set of lipids: Lab Results  Component Value Date   CHOL 148 03/29/2019   HDL 49 03/29/2019   LDLCALC 71 03/29/2019   TRIG 224 (H) 03/29/2019   CHOLHDL 3.0 03/29/2019  On Lipitor. - last eye exam was in 05/2017: No DR -Dr. Gershon Crane -No numbness and tingling in his feet.  He also has a history of HTN, GERD, OSA-on CPAP.  Of note, his wife passed away with  cancer in 04/2018.  ROS: Constitutional: no weight gain/no weight loss, no fatigue, no subjective hyperthermia, no subjective hypothermia Eyes: no blurry vision, no xerophthalmia ENT: no sore throat, no nodules palpated in neck, no dysphagia, no odynophagia, no hoarseness Cardiovascular: no CP/no SOB/no palpitations/no leg swelling Respiratory: no cough/no SOB/no wheezing Gastrointestinal: no N/no V/no D/no C/no acid reflux Musculoskeletal: no muscle aches/no joint aches Skin: no rashes, no hair loss Neurological: no tremors/no numbness/no tingling/no dizziness  I reviewed pt's medications, allergies, PMH, social hx, family hx, and changes were documented in the history of present illness. Otherwise, unchanged from my initial visit note.  Past Medical History:   Diagnosis Date  . CKD (chronic kidney disease) stage 2, GFR 60-89 ml/min    secondary to DM  . Diabetes mellitus without complication ([place]) 9485   insulin requiring  . GERD (gastroesophageal reflux disease)   . Hyperlipidemia   . Hypertension   . Hypokalemia   . Hypomagnesemia   . OSA on CPAP    10-12 years ago  . Vitamin D deficiency    Past Surgical History:  Procedure Laterality Date  . ANAL FISSURE REPAIR  2007  . TONSILLECTOMY Bilateral    As a child  . VENTRAL HERNIA REPAIR  04/13/12   Dr. Johney Maine, ventral hernia repair w/ mesh   Social History   Social History  . Marital status: Married    Spouse name: N/A  . Number of children: 2   Occupational History  . estimator   Social History Main Topics  . Smoking status: Never Smoker  . Smokeless tobacco: Not on file  . Alcohol use 0.0 oz/week    2 - 5 Cans of beer per week   Current Outpatient Medications on File Prior to Visit  Medication Sig Dispense Refill  . aspirin 81 MG tablet Take 81 mg by mouth daily.    Marland Kitchen atorvastatin (LIPITOR) 40 MG tablet Take 1 tablet Daily for Cholesterol 90 tablet 3  . BD INSULIN SYRINGE U/F 31G X 5/16" 0.5 ML MISC INJECT INSULIN TWICE A DAY AS DIRECTED 200 each 3  . bisoprolol-hydrochlorothiazide (ZIAC) 5-6.25 MG tablet Take 1 tablet Daily for BP 90 tablet 3  . Cholecalciferol (VITAMIN D) 125 MCG (5000 UT) CAPS Take 10,000 Units by mouth daily. 60 capsule 0  . Continuous Blood Gluc Sensor (FREESTYLE LIBRE 2 SENSOR SYSTM) MISC 1 each by Does not apply route every 14 (fourteen) days. 6 each 3  . doxazosin (CARDURA) 4 MG tablet Take 1 tablet (4 mg total) by mouth daily. 30 tablet 0  . Dulaglutide (TRULICITY) 3 IO/2.7OJ SOPN Inject 3 mg into the skin once a week. 12 pen 3  . glucose 4 GM chewable tablet Chew 1 tablet (4 g total) by mouth as needed for low blood sugar. 30 tablet 12  . insulin lispro (HUMALOG) 100 UNIT/ML injection 10-12 units before breakfast 15-20 units before lunch and  dinner 210 mL 3  . LANTUS 100 UNIT/ML injection INJECT 50 UNITS UNDER THE SKIN DAILY 50 mL 3  . losartan-hydrochlorothiazide (HYZAAR) 100-12.5 MG tablet TAKE 1 TABLET DAILY FOR BLOOD PRESSURE 90 tablet 3  . Magnesium 500 MG TABS Take 1 tablet by mouth daily.    . metFORMIN (GLUCOPHAGE-XR) 500 MG 24 hr tablet Take 2 tablets 2 x  /day with Meals for Diabetes 360 tablet 3  . omeprazole (PRILOSEC) 20 MG capsule Take 1 capsule Daily for Indigestion & Heartburn 90 capsule 3  . ONE TOUCH ULTRA TEST test  strip USE 3 TO 4 TIMES A DAY FOR FLUCTUATING BLOOD SUGARS 400 each 3  . potassium chloride SA (KLOR-CON M20) 20 MEQ tablet Takke 1 tablet 2 x /day for Potassium 180 tablet 3  . tadalafil (CIALIS) 20 MG tablet Take 1 tablet (20 mg total) by mouth daily as needed for erectile dysfunction. 60 tablet 3   No current facility-administered medications on file prior to visit.   Allergies  Allergen Reactions  . Crestor [Rosuvastatin] Cough   Family History  Problem Relation Age of Onset  . Kidney cancer Father 74       kidney  . Stroke Maternal Grandmother   . Dementia Paternal Grandmother        Early 62s  . Cancer Paternal Wynell Balloon of age or type  . Cancer Paternal Uncle 3    PE: BP (!) 138/80   Pulse 72   Ht 6\' 2"  (1.88 m)   Wt (!) 253 lb (114.8 kg)   SpO2 98%   BMI 32.48 kg/m  Wt Readings from Last 3 Encounters:  09/23/19 (!) 253 lb (114.8 kg)  05/26/19 258 lb (117 kg)  03/29/19 256 lb 6.4 oz (116.3 kg)   Constitutional: overweight, in NAD Eyes: PERRLA, EOMI, no exophthalmos ENT: moist mucous membranes, no thyromegaly, no cervical lymphadenopathy Cardiovascular: RRR, No MRG Respiratory: CTA B Gastrointestinal: abdomen soft, NT, ND, BS+ Musculoskeletal: no deformities, strength intact in all 4 Skin: moist, warm, no rashes Neurological: no tremor with outstretched hands, DTR normal in all 4  ASSESSMENT: 1. LADA, insulin-dependent, uncontrolled, with  complications - CKD stage 2 - ED  - he is interested in an insulin pump  - he has an elliptical machine at home  Component     Latest Ref Rng & Units 07/11/2016  Hemoglobin A1C      6.5  Glutamic Acid Decarb Ab     <5 IU/mL 47 (H)  Pancreatic Islet Cell Antibody     <5 JDF Units <5  C-Peptide     0.80 - 3.85 ng/mL 0.37 (L)  Glucose, Fasting     65 - 99 mg/dL 104 (H)   Elevated GAD antibodies and decreased insulin production. 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 HL  3.  Obesity class I BMI Classification:  < 18.5 underweight   18.5-24.9 normal weight   25.0-29.9 overweight   30.0-34.9 class I obesity   35.0-39.9 class II obesity   ? 40.0 class III obesity   PLAN:  1. Patient with longstanding, uncontrolled, insulin-dependent diabetes (LADA). -At last visit, sugars were higher, both in the morning, and at bedtime.  At bedtime he even has sugars in the 400s. We discussed about improving his diet by cutting out snacks and improving his meals.  He had 1 low blood sugar in the middle of the day after he took 25 units of insulin with breakfast. We discussed that he should not take more than 20 units for this meal and only if sugars are higher before the meal. -At last visit it appears that his Lantus dose was adequate but he was dropping sugars in the middle of the day for him so I advised him to take it around lunchtime. I also increase his Trulicity dose to 3 mg weekly and increase his Humalog with dinner since sugars after this meal were higher. At that  time, HbA1c was higher, at 7.4%. -CGM interpretation: Patient was able to start on the freestyle libre CGM approximately 2 weeks ago.  He loves it.  We reviewed together the downloaded reports: It appears that he has many low blood sugars from approximately 11:30 AM to approximately 5 PM.  Upon questioning, he did not move his Lantus  dose from morning to lunchtime as advised at last visit.  He reduced the dose of Lantus to 40 units 2 days ago but since her sugars are still low, we will go ahead and decrease it further, to 35 units.  Also, his sugars are fairly low after all meals so we will need to reduce his mealtime insulin doses.  It appears that his sugars are very high at night starting to increase around 1 AM and peaking at 8 AM, after which the start to decrease after breakfast.  Therefore, I advised him to move the Lantus in the evening to alleviate hyperglycemia in the second half of the night and in the morning.  Otherwise, we can continue the same doses of Metformin and Trulicity. - I advised him to :  Patient Instructions  Please continue: - Metformin ER 1000 mg 2x a day with meals - Trulicity 3 mg weekly  Please decrease: - Lantus 35 units and move it to dinnertime - Humalog  8-10 units before b'fast 10-12 units before lunch 10-12 (15) units before dinner  Please return in 4 months.     - we checked his HbA1c: 6.9% (improved) - advised to check sugars at different times of the day - 4x a day, rotating check times - advised for yearly eye exams >> he is not UTD - return to clinic in 4 months   2. HL -Reviewed latest lipid panel from 03/2019: LDL at goal, triglycerides high: Lab Results  Component Value Date   CHOL 148 03/29/2019   HDL 49 03/29/2019   LDLCALC 71 03/29/2019   TRIG 224 (H) 03/29/2019   CHOLHDL 3.0 03/29/2019  -Continues Lipitor without side effects  3.  Obesity class I -We'll continue Trulicity which should also help with weight loss -Weight was stable at last visit but he lost 5 pounds since last visit after we increase the dose of Trulicity -Decreasing the insulin doses will help further with weight loss   [name], MD PhD [facility] Endocrinology

## 2019-09-23 NOTE — Patient Instructions (Addendum)
Please continue: - Metformin ER 1000 mg 2x a day with meals - Trulicity 3 mg weekly  Please decrease: - Lantus 35 units and move it to dinnertime - Humalog  8-10 units before b'fast 10-12 units before lunch 10-12 (15) units before dinner  Please return in 4 months.

## 2019-09-28 NOTE — Progress Notes (Signed)
Complete Physical  Assessment and Plan:  Essential hypertension - continue medications, DASH diet, exercise and monitor at home. Call if greater than 130/80.  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   CKD (chronic kidney disease) stage 2, GFR 60-89 ml/min Increase fluids, avoid NSAIDS, monitor sugars, will monitor - CMP WITH GFR  Obesity (BMI 33) Obesity with co morbidities- long discussion about weight loss, diet, and exercise   Hyperlipidemia -continue medications, check lipids, decrease fatty foods, increase activity.  - Lipid panel  Vitamin D deficiency - Vit D  25 hydroxy (rtn osteoporosis monitoring)   Gastroesophageal reflux disease with esophagitis Continue PPI/H2 blocker, diet discussed   Hypoventilation syndrome Sleep apnea- continue CPAP, weight loss advised.    Ventral hernia without obstruction or gangrene monitor  BPH (benign prostatic hypertrophy) Symptoms stable, check PSA, continue cialis   Erectile dysfunction associated with type 2 diabetes mellitus Cialis PRN  Elevated PSA Now following with [facility]; just had PSA per patient Planning 3 month recheck and biopsy if further elevated   Ascending aorta aneurysm Control blood pressure, cholesterol, glucose, increase exercise.  Continue cardio follow up, Dr. Oval Linsey is following, reminded due 11/2019, patient will call   Skin lesion of back Skin lesion with some questionable characteristics (irregular borders and color, size, scabbing); discussed biopsy  here vs derm referral  Patient is overdue for total body skin check and requests referral today which was provided Will plan shave biopsy if too long wait time to get in with derm due to unclear duration of lesion  Discussed med's effects and SE's. Screening labs and tests as requested with regular follow-up as recommended. Over 40 minutes of exam, counseling, chart review and critical decision making was performed  Future Appointments  Date Time Provider [place]  01/10/2020 10:30 AM Liane Comber, NP [facility] None  02/03/2020  4:00 PM [name], MD [facility] None  04/11/2020 10:30 AM Liane Comber, NP [facility] None  10/05/2020  9:30 AM Liane Comber, NP [facility] None     HPI 64 y.o. male patient presents for a complete physical. has Ventral hernia - periumbilical 3-2DJ; Obesity (BMI 30.0-34.9); Hyperlipidemia associated with type 2 diabetes mellitus ([place]); Essential hypertension; Vitamin D deficiency; GERD (gastroesophageal reflux disease); CKD stage 2 due to type 2 diabetes mellitus ([place]); Obesity hypoventilation syndrome (HCC); BPH (benign prostatic hyperplasia); Erectile dysfunction associated with type 2 diabetes mellitus ([place]); PVC's (premature ventricular contractions); Aneurysm, ascending aorta (HCC); LADA (latent autoimmune diabetes in adults), managed as type 2 ([place]); Family history of cerebrovascular disease; OSA on CPAP; Elevated PSA; and Skin lesion of back on their problem list.   Wife passed away from cancer in 05-24-2018 from breast cancer recurrence.  He has 2 children, 2 grandkids. He works from office doing estimates.   Will be traveling in Oct to [place] and in Jan 2022 to [place]  He has OSA on CPAP, also with obesity hypoventilation syndrome and followed by neurology; he endorses 100% compliance with restorative sleep.   BMI is Body mass index is 32.3 kg/m., he does somewhat watch his diet, light breakfast with oatmeal, English muffin, salad and  chicken for lunch, etc; he admits to not exercising much.  Wt Readings from Last 3 Encounters:  09/29/19 (!) 251 lb 9.6 oz (114.1 kg)  09/23/19 (!) 253 lb (114.8 kg)  05/26/19 258 lb (117 kg)   He has known ascending aorta dilation, It was 4.2 cm in 2016, 4.1 cm per recent ECHO 11/2017 by cardiology, plan for follow up in 2 years.  He had repeat stress test and ECHO in 11/2017 which were essentially normal.  Follows with [name]  His blood pressure has been controlled at home, today their BP is BP: 126/78 He does not workout. He denies chest pain, shortness of breath, dizziness.   He is on cholesterol medication (atorvastatin 40 mg daily) and denies myalgias. His cholesterol is at goal. The cholesterol last visit was:   Lab Results  Component Value Date   CHOL 148 03/29/2019   HDL 49 03/29/2019   LDLCALC 71 03/29/2019   TRIG 224 (H) 03/29/2019   CHOLHDL 3.0 03/29/2019   He has been working on diet for LADA managed as T2 diabetes, now has freestyle [place], followed by [name], he is on bASA, he is on ACE/ARB and denies foot ulcerations, increased appetite, nausea, paresthesia of the feet, polydipsia, polyuria, visual disturbances, vomiting and weight loss. Fasting is running 80-140, peak remains <180 He is treated by:    Metformin ER 1000 mg 2x a day twice a day  Trulicity 3 mg weekly  Humalog: 8-15 units before a meal depending on the size of the meal  Lantus 35 units in p.m.  Last A1C in the office was:  Lab Results  Component Value Date   HGBA1C 6.9 (A) 09/23/2019   He has CKD II associated with T2DM. Last GFR: Lab Results  Component Value Date   GFRNONAA 75 03/29/2019    Patient is on Vitamin D supplement, taking 5000 IU irregularly Lab Results  Component Value Date   VD25OH 73 03/29/2019     Last PSA was: Lab Results  Component Value Date   PSA 4.1 (H) 09/21/2018   PSA 3.2 08/04/2017   PSA 3.0 06/02/2016  He was referred to [facility],  saw Dr. Karsten Ro, reports just had PSA checked 2 weeks ago and was ?5.2, planning recheck in 3 months and biopsy.  He endorses slow stream occasionally if he gets up late at night to urinate; typically 1-2 times in the evening.    Current Medications:  Current Outpatient Medications on File Prior to Visit  Medication Sig Dispense Refill  . aspirin 81 MG tablet Take 81 mg by mouth daily.    Marland Kitchen atorvastatin (LIPITOR) 40 MG tablet Take 1 tablet Daily for Cholesterol 90 tablet 3  . BD INSULIN SYRINGE U/F 31G X 5/16" 0.5 ML MISC INJECT INSULIN TWICE A DAY AS DIRECTED 200 each 3  . bisoprolol-hydrochlorothiazide (ZIAC) 5-6.25 MG tablet Take 1 tablet Daily for BP 90 tablet 3  . Cholecalciferol (VITAMIN D) 125 MCG (5000 UT) CAPS Take 10,000 Units by mouth daily. 60 capsule 0  . Continuous Blood Gluc Sensor (FREESTYLE LIBRE 14 DAY SENSOR) MISC Use for continuous blood glucose monitoring. 6 each 3  . doxazosin (CARDURA) 4 MG tablet Take 1 tablet (4 mg total) by mouth daily. 30 tablet 0  . Dulaglutide (TRULICITY) 3 TK/2.4OX SOPN Inject 3 mg into the skin once a week. 12 pen 3  . glucose 4 GM chewable tablet Chew 1 tablet (4 g total) by mouth as needed for low  blood sugar. 30 tablet 12  . glucose blood (FREESTYLE TEST STRIPS) test strip Use 1- 2 times a day with the freestyle libre 14.  CGM glucometer 150 each 3  . insulin glargine (LANTUS) 100 UNIT/ML injection Inject 0.35 mLs (35 Units total) into the skin daily. 45 mL 3  . insulin lispro (HUMALOG) 100 UNIT/ML injection Inject 8-12 units 3x a day before meals 45 mL 3  . losartan-hydrochlorothiazide (HYZAAR) 100-12.5 MG tablet TAKE 1 TABLET DAILY FOR BLOOD PRESSURE 90 tablet 3  . Magnesium 500 MG TABS Take 1 tablet by mouth daily.    . metFORMIN (GLUCOPHAGE-XR) 500 MG 24 hr tablet Take 2 tablets 2 x  /day with Meals for Diabetes 360 tablet 3  . omeprazole (PRILOSEC) 20 MG capsule Take 1 capsule Daily for Indigestion & Heartburn 90 capsule 3  . potassium  chloride SA (KLOR-CON M20) 20 MEQ tablet [place] 1 tablet 2 x /day for Potassium 180 tablet 3  . tadalafil (CIALIS) 20 MG tablet Take 1 tablet (20 mg total) by mouth daily as needed for erectile dysfunction. 60 tablet 3   No current facility-administered medications on file prior to visit.   Allergies:  Allergies  Allergen Reactions  . Crestor [Rosuvastatin] Cough   Health Maintenance:  Immunization History  Administered Date(s) Administered  . Influenza Inj Mdck Quad With Preservative 12/19/2016  . Influenza Split 11/28/2013, 12/06/2014  . Influenza-Unspecified 12/19/2011, 01/04/2013, 01/01/2016, 12/23/2017  . Pneumococcal Conjugate-13 09/29/2019  . Pneumococcal Polysaccharide-23 03/03/2006  . Td 03/03/2008, 03/16/2018  . Zoster 02/07/2011   Tetanus: 03/2018 Pneumovax: 2008 Prevnar 13: DUE  Flu vaccine: 2019 Zostavax: 2012 Covid 19: 2/2, 2021, moderna, will send card  Colonoscopy: 12/2013 (Dr. Cristina Gong) due 2025 Echo 11/2017 Stress test 11/2017 EGD: N/A  Eye Exam: Dr. Gershon Crane q yearly, last 05/01/2017 - he will schedule soon  Dentist: Dr. Carman Ching, [facility], last visit 03/2019, goes q62m Derm: several years ago   Patient Care Team: Unk Pinto, MD as PCP - General (Internal Medicine) Rutherford Guys, MD as Consulting Physician (Ophthalmology) Ronald Lobo, MD as Consulting Physician (Gastroenterology) Larey Dresser, MD as Consulting Physician (Cardiology)  Medical History:  has Ventral hernia - periumbilical 6-2GB; Obesity (BMI 30.0-34.9); Hyperlipidemia associated with type 2 diabetes mellitus ([place]); Essential hypertension; Vitamin D deficiency; GERD (gastroesophageal reflux disease); CKD stage 2 due to type 2 diabetes mellitus ([place]); Obesity hypoventilation syndrome (HCC); BPH (benign prostatic hyperplasia); Erectile dysfunction associated with type 2 diabetes mellitus ([place]); PVC's (premature ventricular contractions); Aneurysm, ascending aorta (HCC); LADA (latent  autoimmune diabetes in adults), managed as type 2 ([place]); Family history of cerebrovascular disease; OSA on CPAP; Elevated PSA; and Skin lesion of back on their problem list. Surgical History:  He  has a past surgical history that includes Anal fissure repair (2007); Ventral hernia repair (04/13/12); and Tonsillectomy (Bilateral). Family History:  His family history includes Cancer in his paternal grandfather; Cancer (age of onset: 31) in his paternal uncle; Dementia in his paternal grandmother; Kidney cancer (age of onset: 51) in his father; Stroke in his maternal grandmother. Social History:   reports that he has never smoked. He has never used smokeless tobacco. He reports current alcohol use of about 5.0 - 6.0 standard drinks of alcohol per week. He reports that he does not use drugs. Review of Systems:  Review of Systems  Constitutional: Negative for malaise/fatigue and weight loss.  HENT: Negative for hearing loss and tinnitus.   Eyes: Negative for blurred vision and double vision.  Respiratory:  Negative for cough, sputum production, shortness of breath and wheezing.   Cardiovascular: Negative for chest pain, palpitations, orthopnea, claudication, leg swelling and PND.  Gastrointestinal: Negative for abdominal pain, blood in stool, constipation, diarrhea, heartburn, melena, nausea and vomiting.  Genitourinary: Negative.   Musculoskeletal: Negative for falls, joint pain and myalgias.  Skin: Negative for rash.  Neurological: Negative for dizziness, tingling, sensory change, weakness and headaches.  Endo/Heme/Allergies: Negative for polydipsia.  Psychiatric/Behavioral: Negative.  Negative for depression, memory loss, substance abuse and suicidal ideas. The patient is not nervous/anxious and does not have insomnia.   All other systems reviewed and are negative.   Physical Exam: Estimated body mass index is 32.3 kg/m as calculated from the following:   Height as of this encounter: 6\' 2"   (1.88 m).   Weight as of this encounter: 251 lb 9.6 oz (114.1 kg). BP 126/78   Pulse 98   Temp (!) 97.3 F (36.3 C)   Ht 6\' 2"  (1.88 m)   Wt (!) 251 lb 9.6 oz (114.1 kg)   SpO2 (!) 75%   BMI 32.30 kg/m  General Appearance: Well nourished, in no apparent distress.  Eyes: PERRLA, EOMs, conjunctiva no swelling or erythema Sinuses: No Frontal/maxillary tenderness  ENT/Mouth: Ext aud canals clear, normal light reflex with TMs without erythema, bulging. Good dentition. No erythema, swelling, or exudate on post pharynx. Tonsils not swollen or erythematous. Hearing normal.  Neck: Supple, thyroid normal. No bruits  Respiratory: Respiratory effort normal, BS equal bilaterally without rales, rhonchi, wheezing or stridor.  Cardio: RRR without murmurs, rubs or gallops. Brisk peripheral pulses without edema.  Chest: symmetric, with normal excursions and percussion.  Abdomen: Soft, obese abdomen, nontender, no guarding, rebound, masses, or organomegaly. He has easily reduced ventral hernia.  Lymphatics: Non tender without lymphadenopathy.  Genitourinary: declines, does regular self exams, no concerns Musculoskeletal: Full ROM all peripheral extremities,5/5 strength, and normal gait.  Skin: Warm, dry without rashes, ecchymosis. He has dark brown, raised 4 mm x 12 mm lesion to R mid back with crusting, irregular borders and coloring to R mid back (see photo below). He has warty round dark brown area to R flank.  Neuro: Cranial nerves intact, reflexes equal bilaterally. Normal muscle tone, no cerebellar symptoms. Sensation intact.  Psych: Awake and oriented X 3, normal affect, Insight and Judgment appropriate.       EKG:  NSR, IRBBB  Gorden Harms [place] 12:26 PM [facility]

## 2019-09-29 ENCOUNTER — Encounter: Payer: Self-pay | Admitting: Adult Health

## 2019-09-29 ENCOUNTER — Other Ambulatory Visit: Payer: Self-pay

## 2019-09-29 ENCOUNTER — Ambulatory Visit (INDEPENDENT_AMBULATORY_CARE_PROVIDER_SITE_OTHER): Payer: 59 | Admitting: Adult Health

## 2019-09-29 VITALS — BP 126/78 | HR 98 | Temp 97.3°F | Ht 74.0 in | Wt 251.6 lb

## 2019-09-29 DIAGNOSIS — G4733 Obstructive sleep apnea (adult) (pediatric): Secondary | ICD-10-CM

## 2019-09-29 DIAGNOSIS — R351 Nocturia: Secondary | ICD-10-CM

## 2019-09-29 DIAGNOSIS — E139 Other specified diabetes mellitus without complications: Secondary | ICD-10-CM

## 2019-09-29 DIAGNOSIS — L989 Disorder of the skin and subcutaneous tissue, unspecified: Secondary | ICD-10-CM

## 2019-09-29 DIAGNOSIS — Z9989 Dependence on other enabling machines and devices: Secondary | ICD-10-CM

## 2019-09-29 DIAGNOSIS — Z0000 Encounter for general adult medical examination without abnormal findings: Secondary | ICD-10-CM

## 2019-09-29 DIAGNOSIS — Z23 Encounter for immunization: Secondary | ICD-10-CM

## 2019-09-29 DIAGNOSIS — Z136 Encounter for screening for cardiovascular disorders: Secondary | ICD-10-CM | POA: Diagnosis not present

## 2019-09-29 DIAGNOSIS — I10 Essential (primary) hypertension: Secondary | ICD-10-CM | POA: Diagnosis not present

## 2019-09-29 DIAGNOSIS — N401 Enlarged prostate with lower urinary tract symptoms: Secondary | ICD-10-CM

## 2019-09-29 DIAGNOSIS — Z13220 Encounter for screening for lipoid disorders: Secondary | ICD-10-CM

## 2019-09-29 DIAGNOSIS — I493 Ventricular premature depolarization: Secondary | ICD-10-CM | POA: Diagnosis not present

## 2019-09-29 DIAGNOSIS — E1169 Type 2 diabetes mellitus with other specified complication: Secondary | ICD-10-CM

## 2019-09-29 DIAGNOSIS — E669 Obesity, unspecified: Secondary | ICD-10-CM

## 2019-09-29 DIAGNOSIS — Z79899 Other long term (current) drug therapy: Secondary | ICD-10-CM

## 2019-09-29 DIAGNOSIS — I7121 Aneurysm of the ascending aorta, without rupture: Secondary | ICD-10-CM

## 2019-09-29 DIAGNOSIS — Z1329 Encounter for screening for other suspected endocrine disorder: Secondary | ICD-10-CM

## 2019-09-29 DIAGNOSIS — K2100 Gastro-esophageal reflux disease with esophagitis, without bleeding: Secondary | ICD-10-CM

## 2019-09-29 DIAGNOSIS — E662 Morbid (severe) obesity with alveolar hypoventilation: Secondary | ICD-10-CM

## 2019-09-29 DIAGNOSIS — K439 Ventral hernia without obstruction or gangrene: Secondary | ICD-10-CM

## 2019-09-29 DIAGNOSIS — R972 Elevated prostate specific antigen [PSA]: Secondary | ICD-10-CM

## 2019-09-29 DIAGNOSIS — Z1389 Encounter for screening for other disorder: Secondary | ICD-10-CM | POA: Diagnosis not present

## 2019-09-29 DIAGNOSIS — N182 Chronic kidney disease, stage 2 (mild): Secondary | ICD-10-CM

## 2019-09-29 DIAGNOSIS — Z1283 Encounter for screening for malignant neoplasm of skin: Secondary | ICD-10-CM

## 2019-09-29 DIAGNOSIS — E559 Vitamin D deficiency, unspecified: Secondary | ICD-10-CM

## 2019-09-29 NOTE — Patient Instructions (Addendum)
Harry [name] , Thank you for taking time to come for your Annual Wellness Visit. I appreciate your ongoing commitment to your health goals. Please review the following plan we discussed and let me know if I can assist you in the future.   These are the goals we discussed: Goals    . Exercise 150 min/wk Moderate Activity    . Weight (lb) < 225 lb (102.1 kg)     Weigh in once a week and keep a log        This is a list of the screening recommended for you and due dates:  Health Maintenance  Topic Date Due  . COVID-19 Vaccine (1) Never done  . Eye exam for diabetics  05/02/2018  . Pneumonia vaccines (1 of 2 - PCV13) 03/21/2019  . Flu Shot  10/02/2019  . Hemoglobin A1C  03/25/2020  . Complete foot exam   09/28/2020  . Colon Cancer Screening  12/22/2023  . Tetanus Vaccine  03/16/2028  .  Hepatitis C: One time screening is recommended by Center for Disease Control  (CDC) for  adults born from [birth year] through 1966.   Completed  . HIV Screening  Completed    Schedule follow up with cardiology 11/2019 - due for follow up aorta  Please schedule diabetes eye exam ASAP and have report forwarded    Know what a healthy weight is for you (roughly BMI <25) and aim to maintain this  Aim for 7+ servings of fruits and vegetables daily  65-80+ fluid ounces of water or unsweet tea for healthy kidneys  Limit to max 1 drink of alcohol per day; avoid smoking/tobacco  Limit animal fats in diet for cholesterol and heart health - choose grass fed whenever available  Avoid highly processed foods, and foods high in saturated/trans fats  Aim for low stress - take time to unwind and care for your mental health  Aim for 150 min of moderate intensity exercise weekly for heart health, and weights twice weekly for bone health  Aim for 7-9 hours of sleep daily        High-Fiber Diet Fiber, also called dietary fiber, is a type of carbohydrate that is found in fruits, vegetables, whole grains, and  beans. A high-fiber diet can have many health benefits. Your health care provider may recommend a high-fiber diet to help:  Prevent constipation. Fiber can make your bowel movements more regular.  Lower your cholesterol.  Relieve the following conditions: ? Swelling of veins in the anus (hemorrhoids). ? Swelling and irritation (inflammation) of specific areas of the digestive tract (uncomplicated diverticulosis). ? A problem of the large intestine (colon) that sometimes causes pain and diarrhea (irritable bowel syndrome, IBS).  Prevent overeating as part of a weight-loss plan.  Prevent heart disease, type 2 diabetes, and certain cancers. What is my plan? The recommended daily fiber intake in grams (g) includes:  38 g for men age 38 or younger.  30 g for men over age 39.  20 g for women age 68 or younger.  21 g for women over age 58. You can get the recommended daily intake of dietary fiber by:  Eating a variety of fruits, vegetables, grains, and beans.  Taking a fiber supplement, if it is not possible to get enough fiber through your diet. What do I need to know about a high-fiber diet?  It is better to get fiber through food sources rather than from fiber supplements. There is not a lot of  research about how effective supplements are.  Always check the fiber content on the nutrition facts label of any prepackaged food. Look for foods that contain 5 g of fiber or more per serving.  Talk with a diet and nutrition specialist (dietitian) if you have questions about specific foods that are recommended or not recommended for your medical condition, especially if those foods are not listed below.  Gradually increase how much fiber you consume. If you increase your intake of dietary fiber too quickly, you may have bloating, cramping, or gas.  Drink plenty of water. Water helps you to digest fiber. What are tips for following this plan?  Eat a wide variety of high-fiber  foods.  Make sure that half of the grains that you eat each day are whole grains.  Eat breads and cereals that are made with whole-grain flour instead of refined flour or white flour.  Eat brown rice, bulgur wheat, or millet instead of white rice.  Start the day with a breakfast that is high in fiber, such as a cereal that contains 5 g of fiber or more per serving.  Use beans in place of meat in soups, salads, and pasta dishes.  Eat high-fiber snacks, such as berries, raw vegetables, nuts, and popcorn.  Choose whole fruits and vegetables instead of processed forms like juice or sauce. What foods can I eat?  Fruits Berries. Pears. Apples. Oranges. Avocado. Prunes and raisins. Dried figs. Vegetables Sweet potatoes. Spinach. Kale. Artichokes. Cabbage. Broccoli. Cauliflower. Green peas. Carrots. Squash. Grains Whole-grain breads. Multigrain cereal. Oats and oatmeal. Brown rice. Barley. Bulgur wheat. [place]. Quinoa. Bran muffins. Popcorn. Rye wafer crackers. Meats and other proteins Navy, kidney, and pinto beans. Soybeans. Split peas. Lentils. Nuts and seeds. Dairy Fiber-fortified yogurt. Beverages Fiber-fortified soy milk. Fiber-fortified orange juice. Other foods Fiber bars. The items listed above may not be a complete list of recommended foods and beverages. Contact a dietitian for more options. What foods are not recommended? Fruits Fruit juice. Cooked, strained fruit. Vegetables Fried potatoes. Canned vegetables. Well-cooked vegetables. Grains White bread. Pasta made with refined flour. White rice. Meats and other proteins Fatty cuts of meat. Fried chicken or fried fish. Dairy Milk. Yogurt. Cream cheese. Sour cream. Fats and oils Butters. Beverages Soft drinks. Other foods Cakes and pastries. The items listed above may not be a complete list of foods and beverages to avoid. Contact a dietitian for more information. Summary  Fiber is a type of carbohydrate. It is  found in fruits, vegetables, whole grains, and beans.  There are many health benefits of eating a high-fiber diet, such as preventing constipation, lowering blood cholesterol, helping with weight loss, and reducing your risk of heart disease, diabetes, and certain cancers.  Gradually increase your intake of fiber. Increasing too fast can result in cramping, bloating, and gas. Drink plenty of water while you increase your fiber.  The best sources of fiber include whole fruits and vegetables, whole grains, nuts, seeds, and beans. This information is not intended to replace advice given to you by your health care provider. Make sure you discuss any questions you have with your health care provider. Document Revised: 12/22/2016 Document Reviewed: 12/22/2016 Elsevier Patient Education  2020 [employer].

## 2019-09-30 LAB — COMPLETE METABOLIC PANEL WITH GFR
AG Ratio: 1.5 (calc) (ref 1.0–2.5)
ALT: 13 U/L (ref 9–46)
AST: 17 U/L (ref 10–35)
Albumin: 4.1 g/dL (ref 3.6–5.1)
Alkaline phosphatase (APISO): 88 U/L (ref 35–144)
BUN: 11 mg/dL (ref 7–25)
CO2: 30 mmol/L (ref 20–32)
Calcium: 9.6 mg/dL (ref 8.6–10.3)
Chloride: 100 mmol/L (ref 98–110)
Creat: 0.96 mg/dL (ref 0.70–1.25)
GFR, Est African American: 96 mL/min/{1.73_m2} (ref >=60)
GFR, Est Non African American: 83 mL/min/{1.73_m2} (ref >=60)
Globulin: 2.8 g/dL (calc) (ref 1.9–3.7)
Glucose, Bld: 84 mg/dL (ref 65–99)
Potassium: 3.7 mmol/L (ref 3.5–5.3)
Sodium: 139 mmol/L (ref 135–146)
Total Bilirubin: 0.9 mg/dL (ref 0.2–1.2)
Total Protein: 6.9 g/dL (ref 6.1–8.1)

## 2019-09-30 LAB — URINALYSIS, ROUTINE W REFLEX MICROSCOPIC
Bilirubin Urine: NEGATIVE
Glucose, UA: NEGATIVE
Hgb urine dipstick: NEGATIVE
Ketones, ur: NEGATIVE
Leukocytes,Ua: NEGATIVE
Nitrite: NEGATIVE
Protein, ur: NEGATIVE
Specific Gravity, Urine: 1.017 (ref 1.001–1.03)
pH: <=5 (ref 5.0–8.0)

## 2019-09-30 LAB — LIPID PANEL
Cholesterol: 126 mg/dL (ref <200)
HDL: 48 mg/dL (ref >=40)
LDL Cholesterol (Calc): 60 mg/dL (calc)
Non-HDL Cholesterol (Calc): 78 mg/dL (calc) (ref <130)
Total CHOL/HDL Ratio: 2.6 (calc) (ref <5.0)
Triglycerides: 97 mg/dL (ref <150)

## 2019-09-30 LAB — CBC WITH DIFFERENTIAL/PLATELET
Absolute Monocytes: 640 cells/uL (ref 200–950)
Basophils Absolute: 90 cells/uL (ref 0–200)
Basophils Relative: 1.1 %
Eosinophils Absolute: 123 cells/uL (ref 15–500)
Eosinophils Relative: 1.5 %
HCT: 47.7 % (ref 38.5–50.0)
Hemoglobin: 16 g/dL (ref 13.2–17.1)
Lymphs Abs: 1468 cells/uL (ref 850–3900)
MCH: 29 pg (ref 27.0–33.0)
MCHC: 33.5 g/dL (ref 32.0–36.0)
MCV: 86.4 fL (ref 80.0–100.0)
MPV: 10.6 fL (ref 7.5–12.5)
Monocytes Relative: 7.8 %
Neutro Abs: 5879 cells/uL (ref 1500–7800)
Neutrophils Relative %: 71.7 %
Platelets: 242 10*3/uL (ref 140–400)
RBC: 5.52 10*6/uL (ref 4.20–5.80)
RDW: 12.6 % (ref 11.0–15.0)
Total Lymphocyte: 17.9 %
WBC: 8.2 10*3/uL (ref 3.8–10.8)

## 2019-09-30 LAB — MICROALBUMIN / CREATININE URINE RATIO
Creatinine, Urine: 124 mg/dL (ref 20–320)
Microalb Creat Ratio: 2 mcg/mg creat (ref <30)
Microalb, Ur: 0.2 mg/dL

## 2019-09-30 LAB — MAGNESIUM: Magnesium: 1.8 mg/dL (ref 1.5–2.5)

## 2019-09-30 LAB — VITAMIN D 25 HYDROXY (VIT D DEFICIENCY, FRACTURES): Vit D, 25-Hydroxy: 56 ng/mL (ref 30–100)

## 2019-09-30 LAB — TSH: TSH: 2.38 mIU/L (ref 0.40–4.50)

## 2019-10-11 ENCOUNTER — Other Ambulatory Visit: Payer: Self-pay | Admitting: Internal Medicine

## 2019-10-21 ENCOUNTER — Telehealth: Payer: Self-pay

## 2019-10-21 NOTE — Telephone Encounter (Signed)
Prior authorization for Freestyle Precision Neo test strips has been approved by patient's insurance.  Coverage is effective 08/28/2019 to 09/26/2020  Approval letter has been sent to scanning.

## 2019-11-11 ENCOUNTER — Other Ambulatory Visit: Payer: Self-pay | Admitting: Internal Medicine

## 2019-11-16 LAB — HM DIABETES EYE EXAM

## 2019-11-18 ENCOUNTER — Encounter: Payer: Self-pay | Admitting: Internal Medicine

## 2020-01-02 ENCOUNTER — Other Ambulatory Visit: Payer: Self-pay | Admitting: Internal Medicine

## 2020-01-02 MED ORDER — ALPRAZOLAM 1 MG PO TABS: ORAL_TABLET | ORAL | 0 refills | Status: DC

## 2020-01-09 NOTE — Progress Notes (Deleted)
3 MONTH FOLLOW UP   Assessment and Plan:   Essential hypertension - continue medications, DASH diet, exercise and monitor at home. Call if greater than 130/80.  -     CBC with Differential/Platelet -     CMP/GFR -     TSH  Aneurysm, ascending aorta ([place]) Follows with cardiology/vascular; follow up imaging due fall 2021 *** Control blood pressure, cholesterol, glucose, increase exercise.   Obesity hypoventilation syndrome (HCC) Continue CPAP, has new machine. Weight loss encouraged  LADA managed as T2DM ([place]) Managed by [name], recentlly very well controlled Discussed general issues about diabetes pathophysiology and management., Educational material distributed., Suggested low cholesterol diet., Encouraged aerobic exercise., Discussed foot care., Reminded to get yearly retinal exam. *** a1c?   CKD stage 2 due to type 2 diabetes mellitus ([place]) Increase fluids, avoid NSAIDS, monitor sugars, will monitor CMP/GFR  Erectile dysfunction associated with type 2 diabetes mellitus ([place]) Glucose control recently improved, managing well with cialis PRN  Hyperlipidemia, unspecified hyperlipidemia type -continue medications for LDL goal <70, check lipids, decrease fatty foods, increase activity.  -     Lipid panel  Obesity with body mass index (BMI) of 30.0 to 34.9 - follow up 3 months for progress monitoring - increase veggies, decrease carbs - long discussion about weight loss, diet, and exercise  Medication management -     Magnesium  Discussed med's effects and SE's. Screening labs and tests as requested with regular follow-up as recommended. Future Appointments  Date Time Provider [place]  01/10/2020 10:30 AM Liane Comber, NP [facility] None  02/03/2020  4:00 PM [name], MD [facility] None  04/11/2020 10:30 AM Liane Comber, NP [facility] None  10/05/2020  9:30 AM Liane Comber, NP [facility] None    HPI Patient presents for 3 month follow up for  HTN, chol, DM with CKD, GERD, obesity, AAA.   Wife passed away from cancer in May 22, 2018 from breast cancer recurrence. He has 2 children, 2 grandkids. He works from office doing estimates. He feels he is doing well and managing.   He has OSA on CPAP, also with obesity hypoventilation syndrome and followed by neurology; he endorses 100% compliance with restorative sleep.    BMI is There is no height or weight on file to calculate BMI., he has been working on diet and exercise, has been trying to walk more but limited by weather, plans to do 30 min, is working on cutting down on snacking in the evening, keeping bad foods out of the house, trying to increase veggies and less pasta and bread.  Wt Readings from Last 3 Encounters:  09/29/19 (!) 251 lb 9.6 oz (114.1 kg)  09/23/19 (!) 253 lb (114.8 kg)  05/26/19 258 lb (117 kg)   He has known ascending aorta dilation, It was 4.2 cm in 2016, 4.1 cm per recent ECHO 11/2017 by cardiology. He had repeat stress test and ECHO in 11/2017 which were essentially normal by Dr. Sallyanne Kuster. He is following with Jacinto Reap, PA with vascular. Follow up due this fall ***  His blood pressure has not been checked at home, today their BP is   He does not workout. He denies chest pain, shortness of breath, dizziness.  He is on cholesterol medication, lipitor 40mg  and denies myalgias. His cholesterol is at goal. The cholesterol last visit was:   Lab Results  Component Value Date   CHOL 126 09/29/2019   HDL 48 09/29/2019   LDLCALC 60 09/29/2019   TRIG 97 09/29/2019  CHOLHDL 2.6 09/29/2019   He has been working on diet and exercise for insulin dependent diabetes with CKD stage 2, he has had DM x 30 + years, he is on bASA, he is on ACE/ARB, metformin, trulicity; he is now following with [name], Dr. Gershon Crane eye exam and is still on lantus 45 units daily, and on humolog 15-20 units before meals depending on size, denies any low blood sugars, normally not taking any  humolog at bed time and denies paresthesia of the feet, polydipsia, polyuria and visual disturbances.   He reports fasting values range 80-160; no low sugars since insulin dose was recently adjusted  He takes cialis 20 mg PRN for ED  Last A1C in the office was:  Lab Results  Component Value Date   HGBA1C 6.9 (A) 09/23/2019   He has CKD II associated with T2DM monitored q37m at this office:  Lab Results  Component Value Date   [facility] 83 09/29/2019   Patient is on Vitamin D supplement, admits wasn't taking regularly, doing more consistently and taking 5000 IU daily .   Lab Results  Component Value Date   VD25OH 53 09/29/2019   He is following with Dr. Claudia Desanctis due to hematuria and elevated PSAs, plans for q13m PSA checks at his office for close monitoring.  Lab Results  Component Value Date   PSA 4.1 (H) 09/21/2018   PSA 3.2 08/04/2017   PSA 3.0 06/02/2016      Current Medications:  Current Outpatient Medications on File Prior to Visit  Medication Sig Dispense Refill  . ALPRAZolam (XANAX) 1 MG tablet Take     1/2 to 1 tablet        2 to 3 x /day          as needed for Anxiety attack 30 tablet 0  . aspirin 81 MG tablet Take 81 mg by mouth daily.    Marland Kitchen atorvastatin (LIPITOR) 40 MG tablet Take 1 tablet Daily for Cholesterol 90 tablet 3  . BD INSULIN SYRINGE U/F 31G X 5/16" 0.5 ML MISC INJECT INSULIN TWICE A DAY AS DIRECTED 200 each 3  . bisoprolol-hydrochlorothiazide (ZIAC) 5-6.25 MG tablet Take 1 tablet Daily for BP 90 tablet 3  . Cholecalciferol (VITAMIN D) 125 MCG (5000 UT) CAPS Take 10,000 Units by mouth daily. 60 capsule 0  . Continuous Blood Gluc Sensor (FREESTYLE LIBRE 14 DAY SENSOR) MISC Use for continuous blood glucose monitoring. 6 each 3  . doxazosin (CARDURA) 4 MG tablet Take 1 tablet (4 mg total) by mouth daily. 30 tablet 0  . Dulaglutide (TRULICITY) 3 ME/2.6ST SOPN Inject 3 mg into the skin once a week. 12 pen 3  . glucose 4 GM chewable tablet Chew 1 tablet (4 g total) by  mouth as needed for low blood sugar. 30 tablet 12  . glucose blood (FREESTYLE TEST STRIPS) test strip Use 1- 2 times a day with the freestyle libre 14.  CGM glucometer 150 each 3  . insulin glargine (LANTUS) 100 UNIT/ML injection Inject 0.35 mLs (35 Units total) into the skin daily. 45 mL 3  . insulin lispro (HUMALOG) 100 UNIT/ML injection Inject 8-12 units 3x a day before meals 45 mL 3  . losartan-hydrochlorothiazide (HYZAAR) 100-12.5 MG tablet TAKE 1 TABLET DAILY FOR BLOOD PRESSURE 90 tablet 3  . Magnesium 500 MG TABS Take 1 tablet by mouth daily.    . metFORMIN (GLUCOPHAGE-XR) 500 MG 24 hr tablet Take 2 tablets 2 x  /day with Meals for  Diabetes 360 tablet 3  . omeprazole (PRILOSEC) 20 MG capsule TAKE 1 CAPSULE DAILY FOR INDIGESTION AND HEARTBURN 90 capsule 3  . potassium chloride SA (KLOR-CON M20) 20 MEQ tablet Takke 1 tablet 2 x /day for Potassium 180 tablet 3  . tadalafil (CIALIS) 20 MG tablet Take 1 tablet (20 mg total) by mouth daily as needed for erectile dysfunction. 60 tablet 3   No current facility-administered medications on file prior to visit.    Allergies:  Allergies  Allergen Reactions  . Crestor [Rosuvastatin] Cough   Medical History:  Past Medical History:  Diagnosis Date  . CKD (chronic kidney disease) stage 2, GFR 60-89 ml/min    secondary to DM  . Diabetes mellitus without complication ([place]) 1610   insulin requiring  . GERD (gastroesophageal reflux disease)   . Hyperlipidemia   . Hypertension   . Hypokalemia   . Hypomagnesemia   . OSA on CPAP    10-12 years ago  . Vitamin D deficiency    Surgical History: reviewed and unchanged Family History: reviewed and unchanged Social History: reviewed and unchanged  Review of Systems:  Review of Systems  Constitutional: Negative.  Negative for malaise/fatigue and weight loss.  HENT: Negative.  Negative for hearing loss and tinnitus.   Eyes: Negative.  Negative for blurred vision and double vision.  Respiratory:  Negative for cough, hemoptysis, sputum production, shortness of breath and wheezing.   Cardiovascular: Negative.  Negative for chest pain, palpitations, orthopnea, claudication and leg swelling.  Gastrointestinal: Negative for abdominal pain, blood in stool, constipation, diarrhea, melena, nausea and vomiting. Heartburn: better with PPI.  Genitourinary: Negative.   Musculoskeletal: Negative.  Negative for joint pain and myalgias.  Skin: Negative.  Negative for rash.  Neurological: Negative.  Negative for dizziness, tingling, sensory change, weakness and headaches.  Endo/Heme/Allergies: Negative.  Negative for polydipsia.  Psychiatric/Behavioral: Negative.   All other systems reviewed and are negative.   Physical Exam: Estimated body mass index is 32.3 kg/m as calculated from the following:   Height as of 09/29/19: 6\' 2"  (1.88 m).   Weight as of 09/29/19: 251 lb 9.6 oz (114.1 kg). There were no vitals taken for this visit. General Appearance: Well nourished, in no apparent distress.  Eyes: PERRLA, EOMs, conjunctiva no swelling or erythema Sinuses: No Frontal/maxillary tenderness  ENT/Mouth: Ext aud canals clear, normal light reflex with TMs without erythema, bulging. No erythema, swelling, or exudate on post pharynx. Hearing normal.  Neck: Supple, thyroid normal. No bruits  Respiratory: Respiratory effort normal, BS equal bilaterally without rales, rhonchi, wheezing or stridor.  Cardio: RRR without murmurs, rubs or gallops, decreased HS due to body habitus. Brisk peripheral pulses without edema.  Chest: symmetric, with normal excursions and percussion.  Abdomen: Soft, nontender, obese, + ventral hernia, no guarding, rebound, masses, or organomegaly.  Lymphatics: Non tender without lymphadenopathy.  Musculoskeletal: Full ROM all peripheral extremities,5/5 strength, and normal gait.  Skin: Warm, dry without rashes, lesions, ecchymosis. Neuro: Cranial nerves intact, reflexes equal  bilaterally. Normal muscle tone, no cerebellar symptoms. Sensation intact.  Psych: Awake and oriented X 3, normal affect, Insight and Judgment appropriate.   Harry [name] 8:32 AM [facility]

## 2020-01-10 ENCOUNTER — Ambulatory Visit: Payer: 59 | Admitting: Adult Health

## 2020-01-10 DIAGNOSIS — E662 Morbid (severe) obesity with alveolar hypoventilation: Secondary | ICD-10-CM

## 2020-01-10 DIAGNOSIS — E1169 Type 2 diabetes mellitus with other specified complication: Secondary | ICD-10-CM

## 2020-01-10 DIAGNOSIS — R972 Elevated prostate specific antigen [PSA]: Secondary | ICD-10-CM

## 2020-01-10 DIAGNOSIS — E559 Vitamin D deficiency, unspecified: Secondary | ICD-10-CM

## 2020-01-10 DIAGNOSIS — E139 Other specified diabetes mellitus without complications: Secondary | ICD-10-CM

## 2020-01-10 DIAGNOSIS — I712 Thoracic aortic aneurysm, without rupture: Secondary | ICD-10-CM

## 2020-01-10 DIAGNOSIS — G4733 Obstructive sleep apnea (adult) (pediatric): Secondary | ICD-10-CM

## 2020-01-10 DIAGNOSIS — E1122 Type 2 diabetes mellitus with diabetic chronic kidney disease: Secondary | ICD-10-CM

## 2020-01-10 DIAGNOSIS — I10 Essential (primary) hypertension: Secondary | ICD-10-CM

## 2020-01-13 NOTE — Progress Notes (Signed)
3 MONTH FOLLOW UP   Assessment and Plan:   Essential hypertension - continue medications, DASH diet, exercise and monitor at home. Call if greater than 130/80.  -     CBC with Differential/Platelet -     CMP/GFR -     TSH  Aneurysm, ascending aorta ([place]) Follows with cardiology/vascular; follow up imaging due fall 2021 - reminded to schedule, phone number given  Control blood pressure, cholesterol, glucose, increase exercise.   Obesity hypoventilation syndrome (HCC) Continue CPAP, has new machine. Weight loss encouraged  LADA managed as T2DM ([place]) Managed by [name], recentlly very well controlled Discussed general issues about diabetes pathophysiology and management., Educational material distributed., Suggested low cholesterol diet., Encouraged aerobic exercise., Discussed foot care., Reminded to get yearly retinal exam. - A1C overdue, he requests this be deferred to [name], has upcoming appointment  CKD stage 2 due to type 2 diabetes mellitus ([place]) Increase fluids, avoid NSAIDS, monitor sugars, will monitor CMP/GFR  Erectile dysfunction associated with type 2 diabetes mellitus ([place]) Glucose control recently improved, managing well with cialis PRN  Hyperlipidemia, unspecified hyperlipidemia type -continue medications for LDL goal <70, check lipids, decrease fatty foods, increase activity.  -     Lipid panel  Obesity with body mass index (BMI) of 30.0 to 34.9 - follow up 3 months for progress monitoring - increase veggies, decrease carbs - long discussion about weight loss, diet, and exercise  Medication management -     Magnesium  Discussed med's effects and SE's. Screening labs and tests as requested with regular follow-up as recommended. Future Appointments  Date Time Provider [place]  02/03/2020  4:00 PM [name], MD [facility] None  04/11/2020 10:30 AM Liane Comber, NP [facility] None  10/05/2020  9:30 AM Liane Comber, NP [facility]  None    HPI Patient presents for 3 month follow up for HTN, chol, DM with CKD, GERD, obesity, AAA.   Wife passed away from cancer in 05/18/18 from breast cancer recurrence. He has 2 children, 2 grandkids. He works from office doing estimates. He feels he is doing well and managing.   He has OSA on CPAP, also with obesity hypoventilation syndrome and followed by neurology; he endorses 100% compliance with restorative sleep.    BMI is Body mass index is 31.46 kg/m., he has been working on diet and exercise, has been walking, 1-2 hours per week, is working on cutting down on snacking in the evening, keeping bad foods out of the house, trying to increase veggies and less pasta and bread.  Wt Readings from Last 3 Encounters:  01/16/20 245 lb (111.1 kg)  09/29/19 (!) 251 lb 9.6 oz (114.1 kg)  09/23/19 (!) 253 lb (114.8 kg)   He has known ascending aorta dilation, It was 4.2 cm in 2016, 4.1 cm per recent ECHO 11/2017 by cardiology. He had repeat stress test and ECHO in 11/2017 which were essentially normal by Dr. Sallyanne Kuster. He is following with Dr. Jacinto Reap with vascular q2 years.    His blood pressure has not been checked at home, today their BP is BP: 106/64 He does workout. He denies chest pain, shortness of breath, dizziness.  He is on cholesterol medication, lipitor 40mg  and denies myalgias. His cholesterol is at goal. The cholesterol last visit was:   Lab Results  Component Value Date   CHOL 126 09/29/2019   HDL 48 09/29/2019   LDLCALC 60 09/29/2019   TRIG 97 09/29/2019   CHOLHDL 2.6 09/29/2019  He has been working on diet and exercise for insulin dependent diabetes with CKD stage 2, he has had DM x 30 + years, he is on bASA, he is on ACE/ARB, metformin, trulicity; he is now following with [name], Dr. Gershon Crane eye exam and is still on lantus 40 units daily, and on humolog 8-12 units before meals depending on size, denies any low blood sugars, normally not taking any humolog at  bed time and denies paresthesia of the feet, polydipsia, polyuria and visual disturbances.   He reports fasting values range 110-140; no low sugars since insulin dose was adjusted Prior to lunch 80-110 Prior to dinner 100-175  He takes cialis 20 mg PRN for ED  Last A1C in the office was:  Lab Results  Component Value Date   HGBA1C 6.9 (A) 09/23/2019   He has CKD II associated with T2DM monitored q20m at this office, on losartan:  Lab Results  Component Value Date   GFRNONAA 83 09/29/2019   Patient is on Vitamin D supplement, admits wasn't taking regularly, doing more consistently and taking 5000 IU daily.   Lab Results  Component Value Date   VD25OH 19 09/29/2019   He is following with Dr. Claudia Desanctis due to hematuria and elevated PSAs, plans for q89m PSA checks at his office for close monitoring. Had rechecked last month, down to 3.7.  Lab Results  Component Value Date   PSA 4.1 (H) 09/21/2018   PSA 3.2 08/04/2017   PSA 3.0 06/02/2016     Current Medications:  Current Outpatient Medications on File Prior to Visit  Medication Sig Dispense Refill  . ALPRAZolam (XANAX) 1 MG tablet Take     1/2 to 1 tablet        2 to 3 x /day          as needed for Anxiety attack 30 tablet 0  . aspirin 81 MG tablet Take 81 mg by mouth daily.    Marland Kitchen atorvastatin (LIPITOR) 40 MG tablet Take 1 tablet Daily for Cholesterol 90 tablet 3  . BD INSULIN SYRINGE U/F 31G X 5/16" 0.5 ML MISC INJECT INSULIN TWICE A DAY AS DIRECTED 200 each 3  . bisoprolol-hydrochlorothiazide (ZIAC) 5-6.25 MG tablet Take 1 tablet Daily for BP 90 tablet 3  . Cholecalciferol (VITAMIN D) 125 MCG (5000 UT) CAPS Take 10,000 Units by mouth daily. 60 capsule 0  . Continuous Blood Gluc Sensor (FREESTYLE LIBRE 14 DAY SENSOR) MISC Use for continuous blood glucose monitoring. 6 each 3  . Dulaglutide (TRULICITY) 3 DV/7.6HY SOPN Inject 3 mg into the skin once a week. 12 pen 3  . glucose 4 GM chewable tablet Chew 1 tablet (4 g total) by mouth as  needed for low blood sugar. 30 tablet 12  . glucose blood (FREESTYLE TEST STRIPS) test strip Use 1- 2 times a day with the freestyle libre 14.  CGM glucometer 150 each 3  . insulin glargine (LANTUS) 100 UNIT/ML injection Inject 0.35 mLs (35 Units total) into the skin daily. 45 mL 3  . insulin lispro (HUMALOG) 100 UNIT/ML injection Inject 8-12 units 3x a day before meals 45 mL 3  . losartan-hydrochlorothiazide (HYZAAR) 100-12.5 MG tablet TAKE 1 TABLET DAILY FOR BLOOD PRESSURE 90 tablet 3  . Magnesium 500 MG TABS Take 1 tablet by mouth daily.    . metFORMIN (GLUCOPHAGE-XR) 500 MG 24 hr tablet Take 2 tablets 2 x  /day with Meals for Diabetes 360 tablet 3  . omeprazole (PRILOSEC) 20 MG  capsule TAKE 1 CAPSULE DAILY FOR INDIGESTION AND HEARTBURN 90 capsule 3  . potassium chloride SA (KLOR-CON M20) 20 MEQ tablet Takke 1 tablet 2 x /day for Potassium 180 tablet 3  . sildenafil (VIAGRA) 25 MG tablet Take 25 mg by mouth daily as needed for erectile dysfunction.    Marland Kitchen doxazosin (CARDURA) 4 MG tablet Take 1 tablet (4 mg total) by mouth daily. 30 tablet 0  . tadalafil (CIALIS) 20 MG tablet Take 1 tablet (20 mg total) by mouth daily as needed for erectile dysfunction. (Patient not taking: Reported on 01/16/2020) 60 tablet 3   No current facility-administered medications on file prior to visit.    Allergies:  Allergies  Allergen Reactions  . Crestor [Rosuvastatin] Cough   Medical History:  Past Medical History:  Diagnosis Date  . CKD (chronic kidney disease) stage 2, GFR 60-89 ml/min    secondary to DM  . Diabetes mellitus without complication ([place]) 2119   insulin requiring  . GERD (gastroesophageal reflux disease)   . Hyperlipidemia   . Hypertension   . Hypokalemia   . Hypomagnesemia   . OSA on CPAP    10-12 years ago  . Vitamin D deficiency    Surgical History: reviewed and unchanged Family History: reviewed and unchanged Social History: reviewed and unchanged  Review of Systems:  Review  of Systems  Constitutional: Negative.  Negative for malaise/fatigue and weight loss.  HENT: Negative.  Negative for hearing loss and tinnitus.   Eyes: Negative.  Negative for blurred vision and double vision.  Respiratory: Negative for cough, hemoptysis, sputum production, shortness of breath and wheezing.   Cardiovascular: Negative.  Negative for chest pain, palpitations, orthopnea, claudication and leg swelling.  Gastrointestinal: Negative for abdominal pain, blood in stool, constipation, diarrhea, melena, nausea and vomiting. Heartburn: better with PPI.  Genitourinary: Negative.   Musculoskeletal: Negative.  Negative for joint pain and myalgias.  Skin: Negative.  Negative for rash.  Neurological: Negative.  Negative for dizziness, tingling, sensory change, weakness and headaches.  Endo/Heme/Allergies: Negative.  Negative for polydipsia.  Psychiatric/Behavioral: Negative.   All other systems reviewed and are negative.   Physical Exam: Estimated body mass index is 31.46 kg/m as calculated from the following:   Height as of 09/29/19: 6\' 2"  (1.88 m).   Weight as of this encounter: 245 lb (111.1 kg). BP 106/64   Pulse 68   Temp (!) 96.6 F (35.9 C)   Wt 245 lb (111.1 kg)   SpO2 99%   BMI 31.46 kg/m  General Appearance: Well nourished, in no apparent distress.  Eyes: PERRLA, EOMs, conjunctiva no swelling or erythema Sinuses: No Frontal/maxillary tenderness  ENT/Mouth: Ext aud canals clear, normal light reflex with TMs without erythema, bulging. No erythema, swelling, or exudate on post pharynx. Hearing normal.  Neck: Supple, thyroid normal. No bruits  Respiratory: Respiratory effort normal, BS equal bilaterally without rales, rhonchi, wheezing or stridor.  Cardio: RRR without murmurs, rubs or gallops, decreased HS due to body habitus. Brisk peripheral pulses without edema.  Chest: symmetric, with normal excursions and percussion.  Abdomen: Soft, nontender, obese, + ventral hernia, no  guarding, rebound, masses, or organomegaly.  Lymphatics: Non tender without lymphadenopathy.  Musculoskeletal: Full ROM all peripheral extremities,5/5 strength, and normal gait.  Skin: Warm, dry without rashes, lesions, ecchymosis. Neuro: Cranial nerves intact, reflexes equal bilaterally. Normal muscle tone, no cerebellar symptoms. Sensation intact.  Psych: Awake and oriented X 3, normal affect, Insight and Judgment appropriate.   Izora Ribas 11:33  AM [facility]

## 2020-01-16 ENCOUNTER — Other Ambulatory Visit: Payer: Self-pay

## 2020-01-16 ENCOUNTER — Encounter: Payer: Self-pay | Admitting: Adult Health

## 2020-01-16 ENCOUNTER — Ambulatory Visit: Payer: 59 | Admitting: Adult Health

## 2020-01-16 VITALS — BP 106/64 | HR 68 | Temp 96.6°F | Wt 245.0 lb

## 2020-01-16 DIAGNOSIS — I712 Thoracic aortic aneurysm, without rupture: Secondary | ICD-10-CM | POA: Diagnosis not present

## 2020-01-16 DIAGNOSIS — G4733 Obstructive sleep apnea (adult) (pediatric): Secondary | ICD-10-CM | POA: Diagnosis not present

## 2020-01-16 DIAGNOSIS — E662 Morbid (severe) obesity with alveolar hypoventilation: Secondary | ICD-10-CM

## 2020-01-16 DIAGNOSIS — I10 Essential (primary) hypertension: Secondary | ICD-10-CM

## 2020-01-16 DIAGNOSIS — E559 Vitamin D deficiency, unspecified: Secondary | ICD-10-CM

## 2020-01-16 DIAGNOSIS — E139 Other specified diabetes mellitus without complications: Secondary | ICD-10-CM

## 2020-01-16 DIAGNOSIS — E785 Hyperlipidemia, unspecified: Secondary | ICD-10-CM

## 2020-01-16 DIAGNOSIS — N182 Chronic kidney disease, stage 2 (mild): Secondary | ICD-10-CM

## 2020-01-16 DIAGNOSIS — E1122 Type 2 diabetes mellitus with diabetic chronic kidney disease: Secondary | ICD-10-CM

## 2020-01-16 DIAGNOSIS — N521 Erectile dysfunction due to diseases classified elsewhere: Secondary | ICD-10-CM

## 2020-01-16 DIAGNOSIS — Z79899 Other long term (current) drug therapy: Secondary | ICD-10-CM

## 2020-01-16 DIAGNOSIS — I7121 Aneurysm of the ascending aorta, without rupture: Secondary | ICD-10-CM

## 2020-01-16 DIAGNOSIS — E1169 Type 2 diabetes mellitus with other specified complication: Secondary | ICD-10-CM

## 2020-01-16 NOTE — Patient Instructions (Addendum)
Goals    . Exercise 150 min/wk Moderate Activity    . Weight (lb) < 225 lb (102.1 kg)     Weigh in once a week and keep a log         May be due for AAA follow up with vascular - [phone] - ? From note sounds like was due for follow up imaging 11/2019     Try antihistamine 24 hour daily at night - 2 weeks, see if mucus improves  If not helping, could try mucinex (guaifenacin) with increased water intake     High-Fiber Diet Fiber, also called dietary fiber, is a type of carbohydrate that is found in fruits, vegetables, whole grains, and beans. A high-fiber diet can have many health benefits. Your health care provider may recommend a high-fiber diet to help:  Prevent constipation. Fiber can make your bowel movements more regular.  Lower your cholesterol.  Relieve the following conditions: ? Swelling of veins in the anus (hemorrhoids). ? Swelling and irritation (inflammation) of specific areas of the digestive tract (uncomplicated diverticulosis). ? A problem of the large intestine (colon) that sometimes causes pain and diarrhea (irritable bowel syndrome, IBS).  Prevent overeating as part of a weight-loss plan.  Prevent heart disease, type 2 diabetes, and certain cancers. What is my plan? The recommended daily fiber intake in grams (g) includes:  38 g for men age 56 or younger.  30 g for men over age 69.  77 g for women age 37 or younger.  21 g for women over age 41. You can get the recommended daily intake of dietary fiber by:  Eating a variety of fruits, vegetables, grains, and beans.  Taking a fiber supplement, if it is not possible to get enough fiber through your diet. What do I need to know about a high-fiber diet?  It is better to get fiber through food sources rather than from fiber supplements. There is not a lot of research about how effective supplements are.  Always check the fiber content on the nutrition facts label of any prepackaged food. Look for  foods that contain 5 g of fiber or more per serving.  Talk with a diet and nutrition specialist (dietitian) if you have questions about specific foods that are recommended or not recommended for your medical condition, especially if those foods are not listed below.  Gradually increase how much fiber you consume. If you increase your intake of dietary fiber too quickly, you may have bloating, cramping, or gas.  Drink plenty of water. Water helps you to digest fiber. What are tips for following this plan?  Eat a wide variety of high-fiber foods.  Make sure that half of the grains that you eat each day are whole grains.  Eat breads and cereals that are made with whole-grain flour instead of refined flour or white flour.  Eat brown rice, bulgur wheat, or millet instead of white rice.  Start the day with a breakfast that is high in fiber, such as a cereal that contains 5 g of fiber or more per serving.  Use beans in place of meat in soups, salads, and pasta dishes.  Eat high-fiber snacks, such as berries, raw vegetables, nuts, and popcorn.  Choose whole fruits and vegetables instead of processed forms like juice or sauce. What foods can I eat?  Fruits Berries. Pears. Apples. Oranges. Avocado. Prunes and raisins. Dried figs. Vegetables Sweet potatoes. Spinach. Kale. Artichokes. Cabbage. Broccoli. Cauliflower. Green peas. Carrots. Squash. Grains Whole-grain breads.  Multigrain cereal. Oats and oatmeal. Brown rice. Barley. Bulgur wheat. [place]. Quinoa. Bran muffins. Popcorn. Rye wafer crackers. Meats and other proteins Navy, kidney, and pinto beans. Soybeans. Split peas. Lentils. Nuts and seeds. Dairy Fiber-fortified yogurt. Beverages Fiber-fortified soy milk. Fiber-fortified orange juice. Other foods Fiber bars. The items listed above may not be a complete list of recommended foods and beverages. Contact a dietitian for more options. What foods are not recommended? Fruits Fruit  juice. Cooked, strained fruit. Vegetables Fried potatoes. Canned vegetables. Well-cooked vegetables. Grains White bread. Pasta made with refined flour. White rice. Meats and other proteins Fatty cuts of meat. Fried chicken or fried fish. Dairy Milk. Yogurt. Cream cheese. Sour cream. Fats and oils Butters. Beverages Soft drinks. Other foods Cakes and pastries. The items listed above may not be a complete list of foods and beverages to avoid. Contact a dietitian for more information. Summary  Fiber is a type of carbohydrate. It is found in fruits, vegetables, whole grains, and beans.  There are many health benefits of eating a high-fiber diet, such as preventing constipation, lowering blood cholesterol, helping with weight loss, and reducing your risk of heart disease, diabetes, and certain cancers.  Gradually increase your intake of fiber. Increasing too fast can result in cramping, bloating, and gas. Drink plenty of water while you increase your fiber.  The best sources of fiber include whole fruits and vegetables, whole grains, nuts, seeds, and beans. This information is not intended to replace advice given to you by your health care provider. Make sure you discuss any questions you have with your health care provider. Document Revised: 12/22/2016 Document Reviewed: 12/22/2016 Elsevier Patient Education  2020 [employer].

## 2020-01-17 LAB — COMPLETE METABOLIC PANEL WITH GFR
AG Ratio: 1.5 (calc) (ref 1.0–2.5)
ALT: 17 U/L (ref 9–46)
AST: 15 U/L (ref 10–35)
Albumin: 4 g/dL (ref 3.6–5.1)
Alkaline phosphatase (APISO): 88 U/L (ref 35–144)
BUN: 15 mg/dL (ref 7–25)
CO2: 30 mmol/L (ref 20–32)
Calcium: 9.7 mg/dL (ref 8.6–10.3)
Chloride: 101 mmol/L (ref 98–110)
Creat: 0.94 mg/dL (ref 0.70–1.25)
GFR, Est African American: 98 mL/min/{1.73_m2} (ref >=60)
GFR, Est Non African American: 85 mL/min/{1.73_m2} (ref >=60)
Globulin: 2.6 g/dL (calc) (ref 1.9–3.7)
Glucose, Bld: 143 mg/dL — ABNORMAL HIGH (ref 65–99)
Potassium: 3.7 mmol/L (ref 3.5–5.3)
Sodium: 139 mmol/L (ref 135–146)
Total Bilirubin: 0.9 mg/dL (ref 0.2–1.2)
Total Protein: 6.6 g/dL (ref 6.1–8.1)

## 2020-01-17 LAB — CBC WITH DIFFERENTIAL/PLATELET
Absolute Monocytes: 549 cells/uL (ref 200–950)
Basophils Absolute: 112 cells/uL (ref 0–200)
Basophils Relative: 1.9 %
Eosinophils Absolute: 100 cells/uL (ref 15–500)
Eosinophils Relative: 1.7 %
HCT: 44.8 % (ref 38.5–50.0)
Hemoglobin: 14.9 g/dL (ref 13.2–17.1)
Lymphs Abs: 1174 cells/uL (ref 850–3900)
MCH: 28.4 pg (ref 27.0–33.0)
MCHC: 33.3 g/dL (ref 32.0–36.0)
MCV: 85.5 fL (ref 80.0–100.0)
MPV: 10.7 fL (ref 7.5–12.5)
Monocytes Relative: 9.3 %
Neutro Abs: 3965 cells/uL (ref 1500–7800)
Neutrophils Relative %: 67.2 %
Platelets: 221 10*3/uL (ref 140–400)
RBC: 5.24 10*6/uL (ref 4.20–5.80)
RDW: 12.6 % (ref 11.0–15.0)
Total Lymphocyte: 19.9 %
WBC: 5.9 10*3/uL (ref 3.8–10.8)

## 2020-01-17 LAB — LIPID PANEL
Cholesterol: 126 mg/dL (ref <200)
HDL: 49 mg/dL (ref >=40)
LDL Cholesterol (Calc): 58 mg/dL (calc)
Non-HDL Cholesterol (Calc): 77 mg/dL (calc) (ref <130)
Total CHOL/HDL Ratio: 2.6 (calc) (ref <5.0)
Triglycerides: 106 mg/dL (ref <150)

## 2020-01-17 LAB — MAGNESIUM: Magnesium: 1.8 mg/dL (ref 1.5–2.5)

## 2020-01-17 LAB — TSH: TSH: 2.83 mIU/L (ref 0.40–4.50)

## 2020-01-22 ENCOUNTER — Other Ambulatory Visit: Payer: Self-pay | Admitting: Internal Medicine

## 2020-01-22 MED ORDER — POTASSIUM CHLORIDE CRYS ER 20 MEQ PO TBCR
EXTENDED_RELEASE_TABLET | ORAL | 3 refills | Status: DC
Start: 2020-01-22 — End: 2021-01-15

## 2020-01-22 MED ORDER — METFORMIN HCL ER 500 MG PO TB24
ORAL_TABLET | ORAL | 3 refills | Status: DC
Start: 2020-01-22 — End: 2021-04-23

## 2020-02-03 ENCOUNTER — Ambulatory Visit (INDEPENDENT_AMBULATORY_CARE_PROVIDER_SITE_OTHER): Payer: 59 | Admitting: Internal Medicine

## 2020-02-03 ENCOUNTER — Other Ambulatory Visit: Payer: Self-pay

## 2020-02-03 ENCOUNTER — Encounter: Payer: Self-pay | Admitting: Internal Medicine

## 2020-02-03 VITALS — BP 128/80 | HR 71 | Ht 73.0 in | Wt 250.4 lb

## 2020-02-03 DIAGNOSIS — E1369 Other specified diabetes mellitus with other specified complication: Secondary | ICD-10-CM

## 2020-02-03 DIAGNOSIS — E1169 Type 2 diabetes mellitus with other specified complication: Secondary | ICD-10-CM

## 2020-02-03 DIAGNOSIS — E139 Other specified diabetes mellitus without complications: Secondary | ICD-10-CM

## 2020-02-03 DIAGNOSIS — E785 Hyperlipidemia, unspecified: Secondary | ICD-10-CM

## 2020-02-03 LAB — POCT GLYCOSYLATED HEMOGLOBIN (HGB A1C): Hemoglobin A1C: 7.4 % — AB (ref 4.0–5.6)

## 2020-02-03 LAB — POCT GLUCOSE (DEVICE FOR HOME USE): Glucose Fasting, POC: 181 mg/dL — AB (ref 70–99)

## 2020-02-03 MED ORDER — INSULIN GLARGINE 100 UNIT/ML ~~LOC~~ SOLN
SUBCUTANEOUS | 3 refills | Status: DC
Start: 2020-02-03 — End: 2020-04-04

## 2020-02-03 NOTE — Patient Instructions (Addendum)
Please continue: - Metformin ER 1000 mg 2x a day with meals - Trulicity 3 mg weekly - Humalog  8-10 units before b'fast 10-12 units before lunch 10-12 (15) units before dinner  Please add the following sliding scale for Humalog: - 141-160: + 2 units  - 161-180: + 3 units  - 181-200: + 4 units  - 201-240: + 5 units  - 241-280: + 6 units  - 281-320: + 7 units  - 321-360: + 8 units  - >361: + 10 units  Please split: - Lantus 15 units in am and 30 units at night (try to take these ~12h apart)  Try to get new test strips for your meter.  Please return in 3 months.

## 2020-02-03 NOTE — Progress Notes (Signed)
Patient ID: Harry [name], male   DOB: 1955/06/02, 64 y.o.   MRN: [id number]   This visit occurred during the SARS-CoV-2 public health emergency.  Safety protocols were in place, including screening questions prior to the visit, additional usage of staff PPE, and extensive cleaning of exam room while observing appropriate contact time as indicated for disinfecting solutions.   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 Last visit 4 months ago.  Reviewed HbA1c levels: Lab Results  Component Value Date   HGBA1C 6.9 (A) 09/23/2019   HGBA1C 7.4 (A) 05/26/2019   HGBA1C 6.1 (A) 01/20/2019   He is on:  Metformin ER 1000 mg 2x a day   Trulicity 1.5 >> 3 mg weekly  Humalog  10-12 >> 8-10 units before b'fast 15-20 >> 12-15 >> 10-12 units before lunch  15-20 >> 20-25 >> 12-15 >> 10-12 (15) units before dinner 5 units at bedtime if >250  Lantus 30 - 30 >> 50 >> 45 >> 40 units in am >> 35 units at bedtime >> 40 at dinnertime  He checks his sugars more than 4 times a day with his freestyle libre CGM.  Freestyle libre CGM parameters: - Average: 130 >> 206 - % active CGM time: 88% >> 84% of the time - Glucose variability 45.9% >> 47.5% (target < or = to 36%) - Glucose management indicator: 6.4% >> 8.2% - time in range:  - very low (<54): 8% >> 1% - low (54-69): 9% >> 4% - normal range (70-180): 63% >> 39% - high sugars (181-250): 15% >> 27% - very high sugars (>250): 5% >> 28%    Previously:   Lowest sugar was 30s  ...>> 40 (sensor) >> 40; he has hypoglycemia awareness in the 60s. Highest sugar was in the 350 >> 300s >> 265 >> HI.  Glucometer: one touch ultra 2  Pt's meals are: - Breakfast: Glucerna, banana >> oatmeal - Lunch: eats out - salad with chicken; chicken burrito; sandwich; chick-fil-a >> sandwich with  either tomatoes or peanut butter and jelly + hold milk - Dinner: meat + veggie + starch - Snacks: potato chips, fruit, icecream No sodas.  -+ Mild CKD, last BUN/creatinine:  Lab Results  Component Value Date   BUN 15 01/16/2020   BUN 11 09/29/2019   CREATININE 0.94 01/16/2020   CREATININE 0.96 09/29/2019  On losartan. -+ HL; last set of lipids: Lab Results  Component Value Date   CHOL 126 01/16/2020   HDL 49 01/16/2020   LDLCALC 58 01/16/2020   TRIG 106 01/16/2020   CHOLHDL 2.6 01/16/2020  On Lipitor 40. - last eye exam was in 11/16/2019: No DR. -He denies numbness and tingling in his feet.  He also has a history of HTN, GERD, OSA-on CPAP.  Of note, his wife passed away with cancer in 2018-05-24.  ROS: Constitutional: no weight gain/no weight loss, no fatigue, no subjective hyperthermia, no subjective hypothermia Eyes: no blurry vision, no xerophthalmia ENT: no sore throat, no nodules palpated in neck, no dysphagia, no odynophagia, no hoarseness Cardiovascular: no CP/no SOB/no palpitations/no leg swelling Respiratory: no cough/no SOB/no wheezing Gastrointestinal: no N/no V/no D/no C/no acid reflux Musculoskeletal: no muscle aches/no joint aches Skin: no rashes, no hair loss Neurological: no tremors/no numbness/no tingling/no dizziness  I reviewed pt's medications, allergies, PMH, social hx, family hx,  and changes were documented in the history of present illness. Otherwise, unchanged from my initial visit note.  Past Medical History:  Diagnosis Date  . CKD (chronic kidney disease) stage 2, GFR 60-89 ml/min    secondary to DM  . Diabetes mellitus without complication ([place]) 4665   insulin requiring  . GERD (gastroesophageal reflux disease)   . Hyperlipidemia   . Hypertension   . Hypokalemia   . Hypomagnesemia   . OSA on CPAP    10-12 years ago  . Vitamin D deficiency    Past Surgical History:  Procedure Laterality Date  . ANAL FISSURE REPAIR  2007  .  TONSILLECTOMY Bilateral    As a child  . VENTRAL HERNIA REPAIR  04/13/12   Dr. Johney Maine, ventral hernia repair w/ mesh   Social History   Social History  . Marital status: Married    Spouse name: N/A  . Number of children: 2   Occupational History  . estimator   Social History Main Topics  . Smoking status: Never Smoker  . Smokeless tobacco: Not on file  . Alcohol use 0.0 oz/week    2 - 5 Cans of beer per week   Current Outpatient Medications on File Prior to Visit  Medication Sig Dispense Refill  . ALPRAZolam (XANAX) 1 MG tablet Take     1/2 to 1 tablet        2 to 3 x /day          as needed for Anxiety attack 30 tablet 0  . aspirin 81 MG tablet Take 81 mg by mouth daily.    Marland Kitchen atorvastatin (LIPITOR) 40 MG tablet Take 1 tablet Daily for Cholesterol 90 tablet 3  . BD INSULIN SYRINGE U/F 31G X 5/16" 0.5 ML MISC INJECT INSULIN TWICE A DAY AS DIRECTED 200 each 3  . bisoprolol-hydrochlorothiazide (ZIAC) 5-6.25 MG tablet Take 1 tablet Daily for BP 90 tablet 3  . Cholecalciferol (VITAMIN D) 125 MCG (5000 UT) CAPS Take 10,000 Units by mouth daily. 60 capsule 0  . Continuous Blood Gluc Sensor (FREESTYLE LIBRE 14 DAY SENSOR) MISC Use for continuous blood glucose monitoring. 6 each 3  . doxazosin (CARDURA) 4 MG tablet Take 1 tablet (4 mg total) by mouth daily. 30 tablet 0  . Dulaglutide (TRULICITY) 3 LD/3.5TS SOPN Inject 3 mg into the skin once a week. 12 pen 3  . glucose 4 GM chewable tablet Chew 1 tablet (4 g total) by mouth as needed for low blood sugar. 30 tablet 12  . glucose blood (FREESTYLE TEST STRIPS) test strip Use 1- 2 times a day with the freestyle libre 14.  CGM glucometer 150 each 3  . insulin glargine (LANTUS) 100 UNIT/ML injection Inject 0.35 mLs (35 Units total) into the skin daily. 45 mL 3  . insulin lispro (HUMALOG) 100 UNIT/ML injection Inject 8-12 units 3x a day before meals 45 mL 3  . losartan-hydrochlorothiazide (HYZAAR) 100-12.5 MG tablet TAKE 1 TABLET DAILY FOR BLOOD  PRESSURE 90 tablet 3  . Magnesium 500 MG TABS Take 1 tablet by mouth daily.    . metFORMIN (GLUCOPHAGE-XR) 500 MG 24 hr tablet Take 2 tablets 2 x  /day with Meals for Diabetes 360 tablet 3  . omeprazole (PRILOSEC) 20 MG capsule TAKE 1 CAPSULE DAILY FOR INDIGESTION AND HEARTBURN 90 capsule 3  . potassium chloride SA (KLOR-CON M20) 20 MEQ tablet [place] 1 tablet 2 x /day for Potassium 180 tablet 3  . sildenafil (VIAGRA) 25 MG  tablet Take 25 mg by mouth daily as needed for erectile dysfunction.    . tadalafil (CIALIS) 20 MG tablet Take 1 tablet (20 mg total) by mouth daily as needed for erectile dysfunction. (Patient not taking: Reported on 01/16/2020) 60 tablet 3   No current facility-administered medications on file prior to visit.   Allergies  Allergen Reactions  . Crestor [Rosuvastatin] Cough   Family History  Problem Relation Age of Onset  . Kidney cancer Father 83       kidney  . Stroke Maternal Grandmother   . Dementia Paternal Grandmother        Early 6s  . Cancer Paternal Wynell Balloon of age or type  . Cancer Paternal Uncle 69    PE: BP 128/80   Pulse 71   Ht 6\' 1"  (1.854 m)   Wt 250 lb 6.4 oz (113.6 kg)   SpO2 98%   BMI 33.04 kg/m  Wt Readings from Last 3 Encounters:  02/03/20 250 lb 6.4 oz (113.6 kg)  01/16/20 245 lb (111.1 kg)  09/29/19 (!) 251 lb 9.6 oz (114.1 kg)   Constitutional: overweight, in NAD Eyes: PERRLA, EOMI, no exophthalmos ENT: moist mucous membranes, no thyromegaly, no cervical lymphadenopathy Cardiovascular: RRR, No MRG Respiratory: CTA B Gastrointestinal: abdomen soft, NT, ND, BS+ Musculoskeletal: no deformities, strength intact in all 4 Skin: moist, warm, no rashes Neurological: no tremor with outstretched hands, DTR normal in all 4  ASSESSMENT: 1. LADA, insulin-dependent, uncontrolled, with complications - CKD stage 2 - ED  - he is interested in an insulin pump  - he has an elliptical machine at home  Component      Latest Ref Rng & Units 07/11/2016  Hemoglobin A1C      6.5  Glutamic Acid Decarb Ab     <5 IU/mL 47 (H)  Pancreatic Islet Cell Antibody     <5 JDF Units <5  C-Peptide     0.80 - 3.85 ng/mL 0.37 (L)  Glucose, Fasting     65 - 99 mg/dL 104 (H)   Elevated GAD antibodies and decreased insulin production. 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 HL  3.  Obesity class I BMI Classification:  < 18.5 underweight   18.5-24.9 normal weight   25.0-29.9 overweight   30.0-34.9 class I obesity   35.0-39.9 class II obesity   ? 40.0 class III obesity   PLAN:  1. Patient with longstanding, uncontrolled, insulin-dependent diabetes (LADA).  At last visit, his HbA1c was improved, at 6.9%.  At that time, he just started on a CGM 2 weeks prior to the appointment and he felt that this helped.  Reviewing the patterns at that time, it appeared that he had many low blood sugars in the early afternoon and, upon questioning, he did not move his Lantus dose from morning to lunchtime as advised at the previous visit.  I advised him to decrease the dose of Lantus and only to dinnertime to improve his morning sugars and avoid further drops in blood sugars during the day.  Sugars were very high at night and starting to increase around 1 AM and peaking around 8 AM, after which they started to decrease after breakfast.  Since sugars were low after meals, we also decreased his insulin with meals. CGM interpretation: -At today's visit, we reviewed her CGM downloads: It appears  that 39%  of values are in target range, compared to last visit, only 56% of values were in target range. Approximately 56% of his blood sugars are higher than 180, and 5% are lower than 70.  The calculated average blood sugar is 206, much higher than at last visit.  The projected HbA1c for the next 3 months ([facility]MI) is 8.2%, higher than his HbA1c obtained today  (please see below) -Reviewing the CGM trends, sugars appear to be much more variable and with much worse control compared to last visit. His sugars were significantly higher around the time of Thanksgiving, but he tells me that even before Thanksgiving, sugars are uncontrolled. He is having very high blood sugars, which started to increase after approximately 12 PM and then drop relatively abruptly 2 hours from 12 AM to 2 AM. I advised him to split the Lantus into 2 doses, a lower dose in the morning and a higher dose at bedtime since I suspect that Lantus is not lasting him for 24 hours. -States he tells me that he corrects his blood sugars with approximately 5 to 10 units of Humalog, and this appears to drop his blood sugars abruptly, at today's visit, I gave him a Humalog sliding scale. Otherwise, we'll continue the same doses of Humalog before meals. -At today's visit, we checked his blood sugars with his CGM, his meter, and our meter and his meter appears to show much lower blood glucose values. I advised him to get new strips for this. - I advised him to :  Patient Instructions  Please continue: - Metformin ER 1000 mg 2x a day with meals - Trulicity 3 mg weekly - Humalog  8-10 units before b'fast 10-12 units before lunch 10-12 (15) units before dinner  Please add the following sliding scale for Humalog: - 141-160: + 2 units  - 161-180: + 3 units  - 181-200: + 4 units  - 201-240: + 5 units  - 241-280: + 6 units  - 281-320: + 7 units  - 321-360: + 8 units  - >361: + 10 units  Please split: - Lantus 15 units in am and 30 units at night (try to take these ~12h apart)  Try to get new test strips for your meter.  Please return in 3 months.     - we checked his HbA1c: 7.4% (higher) - advised to check sugars at different times of the day - 4x a day, rotating check times - advised for yearly eye exams >> he is UTD - return to clinic in 3 months   2. HL -Reviewed latest lipid panel  from last month: All fractions at goal: Lab Results  Component Value Date   CHOL 126 01/16/2020   HDL 49 01/16/2020   LDLCALC 58 01/16/2020   TRIG 106 01/16/2020   CHOLHDL 2.6 01/16/2020  -Continues Lipitor 40 without side effects  3.  Obesity class I -We will continue the GLP-1 receptor agonist which should also help with weight loss -He lost 3 pounds net since last visit   [name], MD PhD [facility] End[facility]

## 2020-03-27 ENCOUNTER — Other Ambulatory Visit: Payer: Self-pay | Admitting: Internal Medicine

## 2020-03-29 ENCOUNTER — Encounter: Payer: Self-pay | Admitting: Internal Medicine

## 2020-03-29 DIAGNOSIS — E139 Other specified diabetes mellitus without complications: Secondary | ICD-10-CM

## 2020-04-04 MED ORDER — TRESIBA FLEXTOUCH 200 UNIT/ML ~~LOC~~ SOPN: PEN_INJECTOR | SUBCUTANEOUS | 1 refills | Status: DC

## 2020-04-11 ENCOUNTER — Ambulatory Visit: Payer: 59 | Admitting: Adult Health

## 2020-04-11 ENCOUNTER — Telehealth: Payer: Self-pay | Admitting: Internal Medicine

## 2020-04-11 NOTE — Telephone Encounter (Signed)
Called and confirmed instructions of 10-16 units in the morning and 30 units at night.

## 2020-04-11 NOTE — Telephone Encounter (Signed)
[facility] called asking to get clarification from a nurse to clarify directions on tresiba   [phone]  Reference# [id number]

## 2020-04-20 NOTE — Progress Notes (Signed)
3 MONTH FOLLOW UP   Assessment and Plan:   Essential hypertension - continue medications, DASH diet, exercise and monitor at home. Call if greater than 130/80.  -     CBC with Differential/Platelet -     CMP/GFR -     TSH  Aneurysm, ascending aorta ([place]) Follows with cardiology/vascular; follow up imaging due fall 2021 - reminded to schedule, phone number given  Control blood pressure, cholesterol, glucose, increase exercise.   Obesity hypoventilation syndrome ([facility]) Continue CPAP, has new machine. Weight loss encouraged  LADA managed as T2DM ([place]) Managed by [name], recentlly very well controlled Discussed general issues about diabetes pathophysiology and management., Educational material distributed., Suggested low cholesterol diet., Encouraged aerobic exercise., Discussed foot care., Reminded to get yearly retinal exam. - A1C - he requests this be deferred to [name], has upcoming appointment  CKD stage 2 due to type 2 diabetes mellitus ([place]) Increase fluids, avoid NSAIDS, monitor sugars, will monitor CMP/GFR  Erectile dysfunction associated with type 2 diabetes mellitus ([place]) Glucose control recently improved, managing well with cialis PRN  Hyperlipidemia, unspecified hyperlipidemia type -continue medications for LDL goal <70, check lipids, decrease fatty foods, increase activity.  -     Lipid panel  Obesity with body mass index (BMI) of 30.0 to 34.9 - follow up 3 months for progress monitoring - increase veggies, decrease carbs - long discussion about weight loss, diet, and exercise  Medication management -     Magnesium  Discussed med's effects and SE's. Screening labs and tests as requested with regular follow-up as recommended. Future Appointments  Date Time Provider [place]  05/04/2020  2:40 PM [name], MD [facility] None  10/05/2020  9:30 AM Liane Comber, NP [facility] None    HPI Patient presents for 3 month follow up for HTN,  chol, DM with CKD, GERD, obesity, AAA.   Wife passed away from cancer in May 08, 2018 from breast cancer recurrence. He has 2 children, 2 grandkids. He works from office doing estimates. He feels he is doing well and managing.   He has OSA on CPAP, also with obesity hypoventilation syndrome and followed by neurology; he endorses 100% compliance with restorative sleep.    BMI is Body mass index is 32.59 kg/m., he has been working on diet and exercise, has been walking, 1-2 hours per week, is working on cutting down on snacking in the evening, keeping bad foods out of the house, trying to increase veggies and less pasta and bread.  Wt Readings from Last 3 Encounters:  2020-05-08 247 lb (112 kg)  02/03/20 250 lb 6.4 oz (113.6 kg)  01/16/20 245 lb (111.1 kg)   He has known ascending aorta dilation, It was 4.2 cm in 2016, 4.1 cm per recent ECHO 11/2017 by cardiology. He had repeat stress test and ECHO in 11/2017 which were essentially normal by Dr. Sallyanne Kuster. He is following with Dr. Jacinto Reap with vascular q2 years.    His blood pressure has not been checked at home, today their BP is BP: 122/74 He does workout. He denies chest pain, shortness of breath, dizziness.  He is on cholesterol medication, lipitor 40mg  and denies myalgias. His cholesterol is at goal. The cholesterol last visit was:   Lab Results  Component Value Date   CHOL 126 01/16/2020   HDL 49 01/16/2020   LDLCALC 58 01/16/2020   TRIG 106 01/16/2020   CHOLHDL 2.6 01/16/2020   He has been working on diet and exercise for insulin dependent/LADA  diabetes with CKD stage 2, he has had DM x 30 + years, he is on bASA, he is on ACE/ARB, metformin, trulicity; he is now following with [name], Dr. Gershon Crane eye exam. Will be switching from lantus to treseba and humalog sliding scale due to insurance changes. Hasn't started yet.  Denies paresthesia of the feet, polydipsia, polyuria and visual disturbances.   He reports fasting values range  80-120 Prior to lunch 80-110 Prior to dinner 100-180 He takes cialis 20 mg PRN for ED  Last A1C in the office was:   Lab Results  Component Value Date   HGBA1C 7.4 (A) 02/03/2020   He has CKD II associated with T2DM monitored q17m at this office, on losartan:  Lab Results  Component Value Date   GFRNONAA 85 01/16/2020   Patient is on Vitamin D supplement,  taking 5000 IU daily.   Lab Results  Component Value Date   VD25OH 38 09/29/2019   He is following with Dr. Claudia Desanctis due to hematuria and elevated PSAs, doing q26m PSAs there, last reportedly declined per their note 12/2019, 3.7; planning MRI if trending up Lab Results  Component Value Date   PSA 4.1 (H) 09/21/2018   PSA 3.2 08/04/2017   PSA 3.0 06/02/2016     Current Medications:  Current Outpatient Medications on File Prior to Visit  Medication Sig Dispense Refill  . ALPRAZolam (XANAX) 1 MG tablet Take     1/2 to 1 tablet        2 to 3 x /day          as needed for Anxiety attack 30 tablet 0  . aspirin 81 MG tablet Take 81 mg by mouth daily.    Marland Kitchen atorvastatin (LIPITOR) 40 MG tablet Take 1 tablet Daily for Cholesterol 90 tablet 3  . BD INSULIN SYRINGE U/F 31G X 5/16" 0.5 ML MISC INJECT INSULIN TWICE A DAY AS DIRECTED 200 each 3  . bisoprolol-hydrochlorothiazide (ZIAC) 5-6.25 MG tablet TAKE 1 TABLET DAILY FOR BLOOD PRESSURE 90 tablet 3  . Cholecalciferol (VITAMIN D) 125 MCG (5000 UT) CAPS Take 10,000 Units by mouth daily. 60 capsule 0  . Continuous Blood Gluc Sensor (FREESTYLE LIBRE 14 DAY SENSOR) MISC Use for continuous blood glucose monitoring. 6 each 3  . Dulaglutide (TRULICITY) 3 IN/8.6VE SOPN Inject 3 mg into the skin once a week. 12 pen 3  . glucose 4 GM chewable tablet Chew 1 tablet (4 g total) by mouth as needed for low blood sugar. 30 tablet 12  . glucose blood (FREESTYLE TEST STRIPS) test strip Use 1- 2 times a day with the freestyle libre 14.  CGM glucometer 150 each 3  . insulin degludec (TRESIBA FLEXTOUCH) 200  UNIT/ML FlexTouch Pen Inject under skin 10-15 units in am and 30 units at night 9 mL 1  . insulin lispro (HUMALOG) 100 UNIT/ML injection Inject 8-12 units 3x a day before meals 45 mL 3  . losartan-hydrochlorothiazide (HYZAAR) 100-12.5 MG tablet TAKE 1 TABLET DAILY FOR BLOOD PRESSURE 90 tablet 3  . Magnesium 500 MG TABS Take 1 tablet by mouth daily.    . metFORMIN (GLUCOPHAGE-XR) 500 MG 24 hr tablet Take 2 tablets 2 x  /day with Meals for Diabetes 360 tablet 3  . omeprazole (PRILOSEC) 20 MG capsule TAKE 1 CAPSULE DAILY FOR INDIGESTION AND HEARTBURN 90 capsule 3  . potassium chloride SA (KLOR-CON M20) 20 MEQ tablet [place] 1 tablet 2 x /day for Potassium 180 tablet 3  . sildenafil (  VIAGRA) 25 MG tablet Take 25 mg by mouth daily as needed for erectile dysfunction.    . tadalafil (CIALIS) 20 MG tablet Take 1 tablet (20 mg total) by mouth daily as needed for erectile dysfunction. 60 tablet 3   No current facility-administered medications on file prior to visit.    Allergies:  Allergies  Allergen Reactions  . Crestor [Rosuvastatin] Cough   Medical History:  Past Medical History:  Diagnosis Date  . CKD (chronic kidney disease) stage 2, GFR 60-89 ml/min    secondary to DM  . Diabetes mellitus without complication ([place]) 6761   insulin requiring  . GERD (gastroesophageal reflux disease)   . Hyperlipidemia   . Hypertension   . Hypokalemia   . Hypomagnesemia   . OSA on CPAP    10-12 years ago  . Vitamin D deficiency    Surgical History: reviewed and unchanged Family History: reviewed and unchanged Social History: reviewed and unchanged   Review of Systems:  Review of Systems  Constitutional: Negative.  Negative for malaise/fatigue and weight loss.  HENT: Negative.  Negative for hearing loss and tinnitus.   Eyes: Negative.  Negative for blurred vision and double vision.  Respiratory: Negative for cough, hemoptysis, sputum production, shortness of breath and wheezing.   Cardiovascular:  Negative.  Negative for chest pain, palpitations, orthopnea, claudication and leg swelling.  Gastrointestinal: Negative for abdominal pain, blood in stool, constipation, diarrhea, melena, nausea and vomiting. Heartburn: better with PPI.  Genitourinary: Negative.   Musculoskeletal: Negative.  Negative for joint pain and myalgias.  Skin: Negative.  Negative for rash.  Neurological: Negative.  Negative for dizziness, tingling, sensory change, weakness and headaches.  Endo/Heme/Allergies: Negative.  Negative for polydipsia.  Psychiatric/Behavioral: Negative.   All other systems reviewed and are negative.   Physical Exam: Estimated body mass index is 32.59 kg/m as calculated from the following:   Height as of 02/03/20: 6\' 1"  (1.854 m).   Weight as of this encounter: 247 lb (112 kg). BP 122/74   Pulse 74   Temp (!) 97.2 F (36.2 C)   Wt 247 lb (112 kg)   SpO2 97%   BMI 32.59 kg/m  General Appearance: Well nourished, in no apparent distress.  Eyes: PERRLA, EOMs, conjunctiva no swelling or erythema Sinuses: No Frontal/maxillary tenderness  ENT/Mouth: Ext aud canals clear, normal light reflex with TMs without erythema, bulging. No erythema, swelling, or exudate on post pharynx. Hearing normal.  Neck: Supple, thyroid normal. No bruits  Respiratory: Respiratory effort normal, BS equal bilaterally without rales, rhonchi, wheezing or stridor.  Cardio: RRR without murmurs, rubs or gallops, decreased HS due to body habitus. Brisk peripheral pulses without edema.  Chest: symmetric, with normal excursions and percussion.  Abdomen: Soft, nontender, obese, + ventral hernia, no guarding, rebound, masses, or organomegaly.  Lymphatics: Non tender without lymphadenopathy.  Musculoskeletal: Full ROM all peripheral extremities,5/5 strength, and normal gait.  Skin: Warm, dry without rashes, lesions, ecchymosis. Neuro: Cranial nerves intact, reflexes equal bilaterally. Normal muscle tone, no cerebellar  symptoms. Sensation intact.  Psych: Awake and oriented X 3, normal affect, Insight and Judgment appropriate.   Gorden Harms Roshun Klingensmith 11:56 AM [facility]

## 2020-04-23 ENCOUNTER — Encounter: Payer: Self-pay | Admitting: Adult Health

## 2020-04-23 ENCOUNTER — Ambulatory Visit (INDEPENDENT_AMBULATORY_CARE_PROVIDER_SITE_OTHER): Payer: 59 | Admitting: Adult Health

## 2020-04-23 ENCOUNTER — Other Ambulatory Visit: Payer: Self-pay

## 2020-04-23 VITALS — BP 122/74 | HR 74 | Temp 97.2°F | Wt 247.0 lb

## 2020-04-23 DIAGNOSIS — N521 Erectile dysfunction due to diseases classified elsewhere: Secondary | ICD-10-CM

## 2020-04-23 DIAGNOSIS — Z79899 Other long term (current) drug therapy: Secondary | ICD-10-CM

## 2020-04-23 DIAGNOSIS — E785 Hyperlipidemia, unspecified: Secondary | ICD-10-CM | POA: Diagnosis not present

## 2020-04-23 DIAGNOSIS — G4733 Obstructive sleep apnea (adult) (pediatric): Secondary | ICD-10-CM | POA: Diagnosis not present

## 2020-04-23 DIAGNOSIS — I10 Essential (primary) hypertension: Secondary | ICD-10-CM

## 2020-04-23 DIAGNOSIS — I7121 Aneurysm of the ascending aorta, without rupture: Secondary | ICD-10-CM

## 2020-04-23 DIAGNOSIS — E559 Vitamin D deficiency, unspecified: Secondary | ICD-10-CM

## 2020-04-23 DIAGNOSIS — E662 Morbid (severe) obesity with alveolar hypoventilation: Secondary | ICD-10-CM

## 2020-04-23 DIAGNOSIS — E1169 Type 2 diabetes mellitus with other specified complication: Secondary | ICD-10-CM

## 2020-04-23 DIAGNOSIS — E1122 Type 2 diabetes mellitus with diabetic chronic kidney disease: Secondary | ICD-10-CM

## 2020-04-23 DIAGNOSIS — I712 Thoracic aortic aneurysm, without rupture: Secondary | ICD-10-CM | POA: Diagnosis not present

## 2020-04-23 DIAGNOSIS — E139 Other specified diabetes mellitus without complications: Secondary | ICD-10-CM

## 2020-04-23 DIAGNOSIS — N182 Chronic kidney disease, stage 2 (mild): Secondary | ICD-10-CM

## 2020-04-23 LAB — COMPLETE METABOLIC PANEL WITH GFR
AG Ratio: 1.6 (calc) (ref 1.0–2.5)
ALT: 14 U/L (ref 9–46)
AST: 13 U/L (ref 10–35)
Albumin: 4.1 g/dL (ref 3.6–5.1)
Alkaline phosphatase (APISO): 85 U/L (ref 35–144)
BUN: 20 mg/dL (ref 7–25)
CO2: 29 mmol/L (ref 20–32)
Calcium: 9.4 mg/dL (ref 8.6–10.3)
Chloride: 100 mmol/L (ref 98–110)
Creat: 0.95 mg/dL (ref 0.70–1.25)
GFR, Est African American: 96 mL/min/{1.73_m2} (ref >=60)
GFR, Est Non African American: 83 mL/min/{1.73_m2} (ref >=60)
Globulin: 2.6 g/dL (calc) (ref 1.9–3.7)
Glucose, Bld: 179 mg/dL — ABNORMAL HIGH (ref 65–99)
Potassium: 3.8 mmol/L (ref 3.5–5.3)
Sodium: 138 mmol/L (ref 135–146)
Total Bilirubin: 0.8 mg/dL (ref 0.2–1.2)
Total Protein: 6.7 g/dL (ref 6.1–8.1)

## 2020-04-23 LAB — LIPID PANEL
Cholesterol: 130 mg/dL (ref <200)
HDL: 49 mg/dL (ref >=40)
LDL Cholesterol (Calc): 65 mg/dL (calc)
Non-HDL Cholesterol (Calc): 81 mg/dL (calc) (ref <130)
Total CHOL/HDL Ratio: 2.7 (calc) (ref <5.0)
Triglycerides: 84 mg/dL (ref <150)

## 2020-04-23 LAB — CBC WITH DIFFERENTIAL/PLATELET
Absolute Monocytes: 660 cells/uL (ref 200–950)
Basophils Absolute: 98 cells/uL (ref 0–200)
Basophils Relative: 1.3 %
Eosinophils Absolute: 203 cells/uL (ref 15–500)
Eosinophils Relative: 2.7 %
HCT: 47.5 % (ref 38.5–50.0)
Hemoglobin: 16 g/dL (ref 13.2–17.1)
Lymphs Abs: 1440 cells/uL (ref 850–3900)
MCH: 28.9 pg (ref 27.0–33.0)
MCHC: 33.7 g/dL (ref 32.0–36.0)
MCV: 85.9 fL (ref 80.0–100.0)
MPV: 10.7 fL (ref 7.5–12.5)
Monocytes Relative: 8.8 %
Neutro Abs: 5100 cells/uL (ref 1500–7800)
Neutrophils Relative %: 68 %
Platelets: 224 10*3/uL (ref 140–400)
RBC: 5.53 10*6/uL (ref 4.20–5.80)
RDW: 12.9 % (ref 11.0–15.0)
Total Lymphocyte: 19.2 %
WBC: 7.5 10*3/uL (ref 3.8–10.8)

## 2020-04-23 LAB — TSH: TSH: 1.72 mIU/L (ref 0.40–4.50)

## 2020-04-23 LAB — VITAMIN D 25 HYDROXY (VIT D DEFICIENCY, FRACTURES): Vit D, 25-Hydroxy: 56 ng/mL (ref 30–100)

## 2020-04-23 LAB — MAGNESIUM: Magnesium: 1.9 mg/dL (ref 1.5–2.5)

## 2020-04-23 NOTE — Patient Instructions (Signed)
Goals    . Exercise 150 min/wk Moderate Activity    . Weight (lb) < 225 lb (102.1 kg)     Weigh in once a week and keep a log          High-Fiber Eating Plan Fiber, also called dietary fiber, is a type of carbohydrate. It is found foods such as fruits, vegetables, whole grains, and beans. A high-fiber diet can have many health benefits. Your health care provider may recommend a high-fiber diet to help:  Prevent constipation. Fiber can make your bowel movements more regular.  Lower your cholesterol.  Relieve the following conditions: ? Inflammation of veins in the anus (hemorrhoids). ? Inflammation of specific areas of the digestive tract (uncomplicated diverticulosis). ? A problem of the large intestine, also called the colon, that sometimes causes pain and diarrhea (irritable bowel syndrome, or IBS).  Prevent overeating as part of a weight-loss plan.  Prevent heart disease, type 2 diabetes, and certain cancers. What are tips for following this plan? Reading food labels  Check the nutrition facts label on food products for the amount of dietary fiber. Choose foods that have 5 grams of fiber or more per serving.  The goals for recommended daily fiber intake include: ? Men (age 61 or younger): 34-38 g. ? Men (over age 76): 28-34 g. ? Women (age 42 or younger): 25-28 g. ? Women (over age 49): 22-25 g. Your daily fiber goal is _____________ g.   Shopping  Choose whole fruits and vegetables instead of processed forms, such as apple juice or applesauce.  Choose a wide variety of high-fiber foods such as avocados, lentils, oats, and kidney beans.  Read the nutrition facts label of the foods you choose. Be aware of foods with added fiber. These foods often have high sugar and sodium amounts per serving. Cooking  Use whole-grain flour for baking and cooking.  Cook with brown rice instead of white rice. Meal planning  Start the day with a breakfast that is high in fiber, such  as a cereal that contains 5 g of fiber or more per serving.  Eat breads and cereals that are made with whole-grain flour instead of refined flour or white flour.  Eat brown rice, bulgur wheat, or millet instead of white rice.  Use beans in place of meat in soups, salads, and pasta dishes.  Be sure that half of the grains you eat each day are whole grains. General information  You can get the recommended daily intake of dietary fiber by: ? Eating a variety of fruits, vegetables, grains, nuts, and beans. ? Taking a fiber supplement if you are not able to take in enough fiber in your diet. It is better to get fiber through food than from a supplement.  Gradually increase how much fiber you consume. If you increase your intake of dietary fiber too quickly, you may have bloating, cramping, or gas.  Drink plenty of water to help you digest fiber.  Choose high-fiber snacks, such as berries, raw vegetables, nuts, and popcorn. What foods should I eat? Fruits Berries. Pears. Apples. Oranges. Avocado. Prunes and raisins. Dried figs. Vegetables Sweet potatoes. Spinach. Kale. Artichokes. Cabbage. Broccoli. Cauliflower. Green peas. Carrots. Squash. Grains Whole-grain breads. Multigrain cereal. Oats and oatmeal. Brown rice. Barley. Bulgur wheat. [place]. Quinoa. Bran muffins. Popcorn. Rye wafer crackers. Meats and other proteins Navy beans, kidney beans, and pinto beans. Soybeans. Split peas. Lentils. Nuts and seeds. Dairy Fiber-fortified yogurt. Beverages Fiber-fortified soy milk. Fiber-fortified orange juice. Other  foods Fiber bars. The items listed above may not be a complete list of recommended foods and beverages. Contact a dietitian for more information. What foods should I avoid? Fruits Fruit juice. Cooked, strained fruit. Vegetables Fried potatoes. Canned vegetables. Well-cooked vegetables. Grains White bread. Pasta made with refined flour. White rice. Meats and other  proteins Fatty cuts of meat. Fried chicken or fried fish. Dairy Milk. Yogurt. Cream cheese. Sour cream. Fats and oils Butters. Beverages Soft drinks. Other foods Cakes and pastries. The items listed above may not be a complete list of foods and beverages to avoid. Talk with your dietitian about what choices are best for you. Summary  Fiber is a type of carbohydrate. It is found in foods such as fruits, vegetables, whole grains, and beans.  A high-fiber diet has many benefits. It can help to prevent constipation, lower blood cholesterol, aid weight loss, and reduce your risk of heart disease, diabetes, and certain cancers.  Increase your intake of fiber gradually. Increasing fiber too quickly may cause cramping, bloating, and gas. Drink plenty of water while you increase the amount of fiber you consume.  The best sources of fiber include whole fruits and vegetables, whole grains, nuts, seeds, and beans. This information is not intended to replace advice given to you by your health care provider. Make sure you discuss any questions you have with your health care provider. Document Revised: 06/23/2019 Document Reviewed: 06/23/2019 Elsevier Patient Education  2021 [employer].

## 2020-05-04 ENCOUNTER — Encounter: Payer: Self-pay | Admitting: Internal Medicine

## 2020-05-04 ENCOUNTER — Other Ambulatory Visit: Payer: Self-pay

## 2020-05-04 ENCOUNTER — Ambulatory Visit (INDEPENDENT_AMBULATORY_CARE_PROVIDER_SITE_OTHER): Payer: 59 | Admitting: Internal Medicine

## 2020-05-04 VITALS — BP 120/78 | HR 74 | Ht 73.0 in | Wt 247.6 lb

## 2020-05-04 DIAGNOSIS — E139 Other specified diabetes mellitus without complications: Secondary | ICD-10-CM | POA: Diagnosis not present

## 2020-05-04 DIAGNOSIS — E785 Hyperlipidemia, unspecified: Secondary | ICD-10-CM | POA: Diagnosis not present

## 2020-05-04 DIAGNOSIS — E1169 Type 2 diabetes mellitus with other specified complication: Secondary | ICD-10-CM | POA: Diagnosis not present

## 2020-05-04 LAB — POCT GLYCOSYLATED HEMOGLOBIN (HGB A1C): Hemoglobin A1C: 6.8 % — AB (ref 4.0–5.6)

## 2020-05-04 NOTE — Patient Instructions (Addendum)
Please continue: - Metformin ER 1000 mg in am and 500 mg at night - Trulicity 3 mg weekly - Lantus 15 units in am and 30 units at night (or Tresiba 46 units in am)  Please move Humalog 15 min before meals and change: - Humalog  6-8 units before b'fast 10-12 units before lunch 10-12 units before dinner Sliding scale for Humalog: - 141-160: + 2 units  - 161-180: + 3 units  - 181-200: + 4 units  - 201-240: + 5 units  - 241-280: + 6 units  - 281-320: + 7 units  - 321-360: + 8 units  - >361: + 10 units  Please return in 3 months.

## 2020-05-04 NOTE — Progress Notes (Signed)
Patient ID: Harry [name], male   DOB: March 27, 1955, 65 y.o.   MRN: [id number]   This visit occurred during the SARS-CoV-2 public health emergency.  Safety protocols were in place, including screening questions prior to the visit, additional usage of staff PPE, and extensive cleaning of exam room while observing appropriate contact time as indicated for disinfecting solutions.   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 Last visit 3 months ago.  Reviewed HbA1c levels: Lab Results  Component Value Date   HGBA1C 7.4 (A) 02/03/2020   HGBA1C 6.9 (A) 09/23/2019   HGBA1C 7.4 (A) 05/26/2019   He is on:  Metformin ER 1000 mg 2x a day  >> 1000 mg in am and 500 mg at night 2/2 D  Trulicity 1.5 >> 3 mg weekly  Humalog  8-10 units before b'fast 10-12 units before lunch 10-12 (15) units before dinner Sliding scale for Humalog: - 141-160: + 2 units  - 161-180: + 3 units  - 181-200: + 4 units  - 201-240: + 5 units  - 241-280: + 6 units  - 281-320: + 7 units  - 321-360: + 8 units  - >361: + 10 units  Lantus 30 - 30 >> 50 >> 45 >> 40 units in am >> 35 units at bedtime >> 40 at dinnertime >> 15 units in a.m. and 38 units at bedtime  He checks his sugars more than 4 times a day with his freestyle libre CGM.   Previously:   Previously:   Lowest sugar was 30s ... >> 40; he has hypoglycemia awareness in the 60s. Highest sugar was in the 300s >> 265 >> HI.  Glucometer: one touch ultra 2  Pt's meals are: - Breakfast: Glucerna, banana >> oatmeal >> English muffin or boiled egg or oatmeal - Lunch: eats out - salad with chicken; chicken burrito; sandwich; chick-fil-a >> sandwich with either tomatoes or peanut butter and jelly + hold milk - Dinner: meat + veggie + starch - Snacks: potato chips, fruit, icecream No sodas.  -+  Mild CKD, last BUN/creatinine:  Lab Results  Component Value Date   BUN 20 May 20, 2020   BUN 15 01/16/2020   CREATININE 0.95 05-20-20   CREATININE 0.94 01/16/2020  On losartan. -+ HL; last set of lipids: Lab Results  Component Value Date   CHOL 130 2020/05/20   HDL 49 2020/05/20   LDLCALC 65 05-20-2020   TRIG 84 2020/05/20   CHOLHDL 2.7 20-May-2020  On Lipitor 40. - last eye exam was in 11/2019: No DR -No numbness and tingling in his feet.  He also has a history of HTN, GERD, OSA-on CPAP.  Of note, his wife passed away with cancer in May 20, 2018.  ROS: Constitutional: no weight gain/no weight loss, no fatigue, no subjective hyperthermia, no subjective hypothermia Eyes: no blurry vision, no xerophthalmia ENT: no sore throat, no nodules palpated in neck, no dysphagia, no odynophagia, no hoarseness Cardiovascular: no CP/no SOB/no palpitations/no leg swelling Respiratory: no cough/no SOB/no wheezing Gastrointestinal: no N/no V/no D/no C/no acid reflux Musculoskeletal: no muscle aches/no joint aches Skin: no rashes, no hair loss Neurological: no tremors/no numbness/no tingling/no dizziness  I reviewed pt's medications, allergies, PMH, social hx, family hx, and changes were documented in the history of present illness. Otherwise, unchanged from my initial visit note.  Past Medical History:  Diagnosis Date   CKD (chronic kidney disease) stage 2, GFR 60-89 ml/min    secondary to DM   Diabetes mellitus without complication (HCC) 7026   insulin requiring   GERD (gastroesophageal reflux disease)    Hyperlipidemia    Hypertension    Hypokalemia    Hypomagnesemia    OSA on CPAP    10-12 years ago   Vitamin D deficiency    Past Surgical History:  Procedure Laterality Date   ANAL FISSURE REPAIR  2007   TONSILLECTOMY Bilateral    As a child   VENTRAL HERNIA REPAIR  04/13/12   Dr. Johney Maine, ventral hernia repair w/ mesh   Social History   Social History   Marital  status: Married    Spouse name: N/A   Number of children: 2   Occupational History   estimator   Social History Main Topics   Smoking status: Never Smoker   Smokeless tobacco: Not on file   Alcohol use 0.0 oz/week    2 - 5 Cans of beer per week   Current Outpatient Medications on File Prior to Visit  Medication Sig Dispense Refill   ALPRAZolam (XANAX) 1 MG tablet Take     1/2 to 1 tablet        2 to 3 x /day          as needed for Anxiety attack 30 tablet 0   aspirin 81 MG tablet Take 81 mg by mouth daily.     atorvastatin (LIPITOR) 40 MG tablet Take 1 tablet Daily for Cholesterol 90 tablet 3   BD INSULIN SYRINGE U/F 31G X 5/16" 0.5 ML MISC INJECT INSULIN TWICE A DAY AS DIRECTED 200 each 3   bisoprolol-hydrochlorothiazide (ZIAC) 5-6.25 MG tablet TAKE 1 TABLET DAILY FOR BLOOD PRESSURE 90 tablet 3   Cholecalciferol (VITAMIN D) 125 MCG (5000 UT) CAPS Take 10,000 Units by mouth daily. 60 capsule 0   Continuous Blood Gluc Sensor (FREESTYLE LIBRE 14 DAY SENSOR) MISC Use for continuous blood glucose monitoring. 6 each 3   Dulaglutide (TRULICITY) 3 VZ/8.5YI SOPN Inject 3 mg into the skin once a week. 12 pen 3   glucose 4 GM chewable tablet Chew 1 tablet (4 g total) by mouth as needed for low blood sugar. 30 tablet 12   glucose blood (FREESTYLE TEST STRIPS) test strip Use 1- 2 times a day with the freestyle libre 14.  CGM glucometer 150 each 3   insulin degludec (TRESIBA FLEXTOUCH) 200 UNIT/ML FlexTouch Pen Inject under skin 10-15 units in am and 30 units at night 9 mL 1   insulin lispro (HUMALOG) 100 UNIT/ML injection Inject 8-12 units 3x a day before meals 45 mL 3   losartan-hydrochlorothiazide (HYZAAR) 100-12.5 MG tablet TAKE 1 TABLET DAILY FOR BLOOD PRESSURE 90 tablet 3   Magnesium 500 MG TABS Take 1 tablet by mouth daily.     metFORMIN (GLUCOPHAGE-XR) 500 MG 24 hr tablet Take 2 tablets 2 x  /day with Meals for Diabetes 360 tablet 3   omeprazole (PRILOSEC) 20 MG capsule  TAKE 1 CAPSULE DAILY FOR INDIGESTION AND HEARTBURN 90 capsule 3   potassium chloride SA (KLOR-CON M20) 20 MEQ tablet [place] 1 tablet 2 x /day for Potassium 180 tablet 3   sildenafil (VIAGRA) 25 MG tablet Take 25 mg by mouth daily as needed for erectile dysfunction.     tadalafil (CIALIS) 20 MG tablet Take 1 tablet (20 mg total) by mouth daily as needed for erectile dysfunction. [place]  tablet 3   No current facility-administered medications on file prior to visit.   Allergies  Allergen Reactions   Crestor [Rosuvastatin] Cough   Family History  Problem Relation Age of Onset   Kidney cancer Father 20       kidney   Stroke Maternal Grandmother    Dementia Paternal Grandmother        Early 69s   Cancer Paternal Grandfather        Unsure of age or type   Cancer Paternal Uncle 69    PE: There were no vitals taken for this visit. Wt Readings from Last 3 Encounters:  04/23/20 247 lb (112 kg)  02/03/20 250 lb 6.4 oz (113.6 kg)  01/16/20 245 lb (111.1 kg)   Constitutional: overweight, in NAD Eyes: PERRLA, EOMI, no exophthalmos ENT: moist mucous membranes, no thyromegaly, no cervical lymphadenopathy Cardiovascular: RRR, No MRG Respiratory: CTA B Gastrointestinal: abdomen soft, NT, ND, BS+ Musculoskeletal: no deformities, strength intact in all 4 Skin: moist, warm, no rashes Neurological: no tremor with outstretched hands, DTR normal in all 4  ASSESSMENT: 1. LADA, insulin-dependent, uncontrolled, with complications - CKD stage 2 - ED  - he is interested in an insulin pump  - he has an elliptical machine at home  Component     Latest Ref Rng & Units 07/11/2016  Hemoglobin A1C      6.5  Glutamic Acid Decarb Ab     <5 IU/mL 47 (H)  Pancreatic Islet Cell Antibody     <5 JDF Units <5  C-Peptide     0.80 - 3.85 ng/mL 0.37 (L)  Glucose, Fasting     65 - 99 mg/dL 104 (H)   Elevated GAD antibodies and decreased insulin production. 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 HL  3.  Obesity class I BMI Classification:  < 18.5 underweight   18.5-24.9 normal weight   25.0-29.9 overweight   30.0-34.9 class I obesity   35.0-39.9 class II obesity   ? 40.0 class III obesity   PLAN:  1. Patient with longstanding, uncontrolled, insulin-dependent diabetes (LADA) with still poor control.  His sugars improved initially after getting the CGM, but they worsened at last visit.  At that time, only 39% of his values were in target range and they were much more variable, with higher blood sugars after 12 PM and an abrupt decrease from 12 AM to 2 AM.  At that time, I advised him to split Lantus insulin into 2 doses, I gave him Humalog sliding scale and I also advised him to get new strips for his meter. -at this visit, he is on the lower dose of Metformin, decreased by PCP after he complained of diarrhea.  He feels much better on only 3 tablets a day.  We will continue this dose. CGM interpretation: -At today's visit, we reviewed her CGM downloads: It appears that 66% of values are in target range (goal >70%) but improved from last visit, while 19% are higher than 180 (goal <25%), and 15% are lower than 70 (goal <4%).  The calculated average blood sugar is 133.  The projected HbA1c for the next 3 months (GMI) is 6.5%. -Reviewing the CGM trends, his sugars are fairly stable overnight, improving from that time, decreased slightly after breakfast and drop significantly before lunch.  As he has a smaller breakfast,  I advised him to decrease the  Humalog dose before this meal.  However, after lunch, sugars increase precipitously and they continue to worsen after dinner.  I advised him to use a larger dose of Humalog before lunch but would not change the insulin with dinner to avoid low blood sugars overnight. -He tells me that his insurance now prefers [place] rather than Lantus.  I advised him  that this is a longer acting insulin that does not need to be taken twice a day >> I advised him to take 46 units daily. -Otherwise, we can continue with the rest of the regimen. - I advised him to:  Patient Instructions  Please continue: - Metformin ER 1000 mg in am and 500 mg at night - Trulicity 3 mg weekly - Lantus 15 units in am and 30 units at night (or Tresiba 46 units in am)  Please move Humalog 15 min before meals and change: - Humalog  6-8 units before b'fast 10-12 units before lunch 10-12 units before dinner Sliding scale for Humalog: - 141-160: + 2 units  - 161-180: + 3 units  - 181-200: + 4 units  - 201-240: + 5 units  - 241-280: + 6 units  - 281-320: + 7 units  - 321-360: + 8 units  - >361: + 10 units  Please return in 3 months.       - we checked his HbA1c: 6.8% (lower) - advised to check sugars at different times of the day - 4x a day, rotating check times - advised for yearly eye exams >> he is UTD - return to clinic in 3-4 months   2. HL -Reviewed his lipid panel from last month-all fractions at goal:: Lab Results  Component Value Date   CHOL 130 04/23/2020   HDL 49 04/23/2020   LDLCALC 65 04/23/2020   TRIG 84 04/23/2020   CHOLHDL 2.7 04/23/2020  -Continues Lipitor 40 without side effects  3.  Obesity class I -We will continue the GLP-1 receptor agonist which should also help with weight loss -He lost 3 pounds before last visit, gained 2 since then   [name], MD PhD [facility] Endocrinology

## 2020-06-11 ENCOUNTER — Other Ambulatory Visit: Payer: Self-pay | Admitting: Internal Medicine

## 2020-06-26 ENCOUNTER — Other Ambulatory Visit: Payer: Self-pay | Admitting: Internal Medicine

## 2020-07-13 ENCOUNTER — Other Ambulatory Visit: Payer: Self-pay | Admitting: Internal Medicine

## 2020-07-27 ENCOUNTER — Telehealth: Payer: Self-pay | Admitting: *Deleted

## 2020-07-27 DIAGNOSIS — I712 Thoracic aortic aneurysm, without rupture: Secondary | ICD-10-CM

## 2020-07-27 DIAGNOSIS — I7121 Aneurysm of the ascending aorta, without rupture: Secondary | ICD-10-CM

## 2020-07-27 NOTE — Telephone Encounter (Signed)
mychart message sent to get Echo scheduled, due in October  Needs follow up after Echo

## 2020-08-08 ENCOUNTER — Other Ambulatory Visit: Payer: Self-pay | Admitting: Internal Medicine

## 2020-08-08 MED ORDER — AZITHROMYCIN 250 MG PO TABS: ORAL_TABLET | ORAL | 0 refills | Status: DC

## 2020-08-08 MED ORDER — DEXAMETHASONE 2 MG PO TABS: ORAL_TABLET | ORAL | 0 refills | Status: DC

## 2020-08-13 ENCOUNTER — Ambulatory Visit (INDEPENDENT_AMBULATORY_CARE_PROVIDER_SITE_OTHER): Payer: 59 | Admitting: Internal Medicine

## 2020-08-13 ENCOUNTER — Other Ambulatory Visit: Payer: Self-pay

## 2020-08-13 ENCOUNTER — Encounter: Payer: Self-pay | Admitting: Internal Medicine

## 2020-08-13 VITALS — BP 140/82 | HR 77 | Ht 73.0 in | Wt 248.4 lb

## 2020-08-13 DIAGNOSIS — E1369 Other specified diabetes mellitus with other specified complication: Secondary | ICD-10-CM | POA: Diagnosis not present

## 2020-08-13 DIAGNOSIS — E1169 Type 2 diabetes mellitus with other specified complication: Secondary | ICD-10-CM

## 2020-08-13 DIAGNOSIS — E139 Other specified diabetes mellitus without complications: Secondary | ICD-10-CM

## 2020-08-13 DIAGNOSIS — E785 Hyperlipidemia, unspecified: Secondary | ICD-10-CM | POA: Diagnosis not present

## 2020-08-13 LAB — POCT GLYCOSYLATED HEMOGLOBIN (HGB A1C): Hemoglobin A1C: 7.5 % — AB (ref 4.0–5.6)

## 2020-08-13 MED ORDER — LYUMJEV KWIKPEN 100 UNIT/ML ~~LOC~~ SOPN: PEN_INJECTOR | SUBCUTANEOUS | 3 refills | Status: DC

## 2020-08-13 NOTE — Patient Instructions (Addendum)
Please continue: - Metformin ER 1000 mg in am and 500 mg at night - Trulicity 3 mg weekly - Tresiba 44 units daily  Please change from Humalog to: - Lyumjev - at the start of the meals: 6-8 units before b'fast 12-14 units before lunch 12-14 units before dinner Sliding scale for Humalog: - 141-160: + 2 units  - 161-180: + 3 units  - 181-200: + 4 units  - 201-240: + 5 units  - 241-280: + 6 units  - 281-320: + 7 units  - 321-360: + 8 units  - >361: + 10 units  Please return in 4 months.

## 2020-08-13 NOTE — Progress Notes (Signed)
Patient ID: Harry [name], male   DOB: 1955/07/27, 65 y.o.   MRN: [id number]   This visit occurred during the SARS-CoV-2 public health emergency.  Safety protocols were in place, including screening questions prior to the visit, additional usage of staff PPE, and extensive cleaning of exam room while observing appropriate contact time as indicated for disinfecting solutions.   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 Last visit 3 months ago.  Interim history: No increased urination, blurry vision, nausea, chest pain. He had a URI >> on ABx and steroids. Sugars were higher. He is preparing to go to [place] in 3 days.  Reviewed HbA1c levels: Lab Results  Component Value Date   HGBA1C 6.8 (A) 05/04/2020   HGBA1C 7.4 (A) 02/03/2020   HGBA1C 6.9 (A) 09/23/2019   He is on: - Metformin ER 1000 mg in am and 500 mg at night - 2/2 diarrhea - Trulicity 1.5 >> 3 mg weekly - >> Tresiba 44 units at bedtime - Humalog (15 minutes before meals) 6-8 units before b'fast 10-12 units before lunch 10-12 units before dinner Sliding scale for Humalog: - 141-160: + 2 units  - 161-180: + 3 units  - 181-200: + 4 units  - 201-240: + 5 units  - 241-280: + 6 units  - 281-320: + 7 units  - 321-360: + 8 units  - >361: + 10 units  He checks his sugars more than 4 times a day with his freestyle libre CGM:   Previously:   Previously:   Lowest sugar was 30s ... >> 40 >> 51; he has hypoglycemia awareness in the 60s. Highest sugar was in the 300s >> 265 >> HI >> 381.  Glucometer: one touch ultra 2  Pt's meals are: - Breakfast: Glucerna, banana >> oatmeal >> English muffin or boiled egg or oatmeal - Lunch: eats out - salad with chicken; chicken burrito; sandwich; chick-fil-a >> sandwich with either tomatoes or peanut butter and jelly + hold  milk - Dinner: meat + veggie + starch - Snacks: potato chips, fruit, icecream No sodas.  -+ Mild CKD, last BUN/creatinine:  Lab Results  Component Value Date   BUN 20 04/23/2020   BUN 15 01/16/2020   CREATININE 0.95 04/23/2020   CREATININE 0.94 01/16/2020  On losartan. -+ HL; last set of lipids: Lab Results  Component Value Date   CHOL 130 04/23/2020   HDL 49 04/23/2020   LDLCALC 65 04/23/2020   TRIG 84 04/23/2020   CHOLHDL 2.7 04/23/2020  On Lipitor 40. - last eye exam was in 11/2019: No DR -No numbness and tingling in his feet.  He also has a history of HTN, GERD, OSA-on CPAP.  Of note, his wife passed away with cancer in 04-25-2018.  ROS: Constitutional: no weight gain/no weight loss, no fatigue, no subjective hyperthermia, no subjective hypothermia Eyes: no blurry vision, no xerophthalmia ENT: no sore throat, no nodules palpated in neck, no dysphagia, no odynophagia, no hoarseness Cardiovascular: no CP/no SOB/no palpitations/no leg swelling Respiratory: no cough/no SOB/no wheezing Gastrointestinal: no N/no V/no D/no C/no acid reflux Musculoskeletal: no muscle aches/no joint aches Skin: no rashes, no hair loss Neurological: no tremors/no numbness/no tingling/no dizziness  I reviewed pt's medications, allergies, PMH, social hx, family hx, and changes were documented in the history of present illness. Otherwise, unchanged from my initial  visit note.  Past Medical History:  Diagnosis Date   CKD (chronic kidney disease) stage 2, GFR 60-89 ml/min    secondary to DM   Diabetes mellitus without complication ([place]) 1884   insulin requiring   GERD (gastroesophageal reflux disease)    Hyperlipidemia    Hypertension    Hypokalemia    Hypomagnesemia    OSA on CPAP    10-12 years ago   Vitamin D deficiency    Past Surgical History:  Procedure Laterality Date   ANAL FISSURE REPAIR  2007   TONSILLECTOMY Bilateral    As a child   VENTRAL HERNIA REPAIR  04/13/12   Dr.  Johney Maine, ventral hernia repair w/ mesh   Social History   Social History   Marital status: Married    Spouse name: N/A   Number of children: 2   Occupational History   estimator   Social History Main Topics   Smoking status: Never Smoker   Smokeless tobacco: Not on file   Alcohol use 0.0 oz/week    2 - 5 Cans of beer per week   Current Outpatient Medications on File Prior to Visit  Medication Sig Dispense Refill   ALPRAZolam (XANAX) 1 MG tablet Take     1/2 to 1 tablet        2 to 3 x /day          as needed for Anxiety attack 30 tablet 0   aspirin 81 MG tablet Take 81 mg by mouth daily.     atorvastatin (LIPITOR) 40 MG tablet Take 1 tablet Daily for Cholesterol 90 tablet 3   azithromycin (ZITHROMAX) 250 MG tablet Take 2 tablets with Food on  Day 1, then 1 tablet Daily with Food for Sinusitis / Bronchitis 6 each 0   BD INSULIN SYRINGE U/F 31G X 5/16" 0.5 ML MISC INJECT INSULIN TWICE A DAY AS DIRECTED 200 each 3   bisoprolol-hydrochlorothiazide (ZIAC) 5-6.25 MG tablet TAKE 1 TABLET DAILY FOR BLOOD PRESSURE 90 tablet 3   Cholecalciferol (VITAMIN D) 125 MCG (5000 UT) CAPS Take 10,000 Units by mouth daily. 60 capsule 0   Continuous Blood Gluc Sensor (FREESTYLE LIBRE 14 DAY SENSOR) MISC USE FOR CONTINUOUS BLOOD GLUCOSE MONITORING 6 each 3   dexamethasone (DECADRON) 2 MG tablet Take 1 tab 3 x /day for 2 days, then 2 x /day for 2  Days, then 1 tab daily 13 tablet 0   glucose 4 GM chewable tablet Chew 1 tablet (4 g total) by mouth as needed for low blood sugar. 30 tablet 12   glucose blood (FREESTYLE TEST STRIPS) test strip Use 1- 2 times a day with the freestyle libre 14.  CGM glucometer 150 each 3   insulin degludec (TRESIBA FLEXTOUCH) 200 UNIT/ML FlexTouch Pen Inject under skin 10-15 units in am and 30 units at night 9 mL 1   insulin lispro (HUMALOG) 100 UNIT/ML injection INJECT 10 UNITS TO 12 UNITS BEFORE BREAKFAST AND 15 UNITS TO 20 UNITS BEFORE LUNCH AND DINNER 210 mL 3    losartan-hydrochlorothiazide (HYZAAR) 100-12.5 MG tablet TAKE 1 TABLET DAILY FOR BLOOD PRESSURE 90 tablet 3   Magnesium 500 MG TABS Take 1 tablet by mouth daily.     metFORMIN (GLUCOPHAGE-XR) 500 MG 24 hr tablet Take 2 tablets 2 x  /day with Meals for Diabetes 360 tablet 3   omeprazole (PRILOSEC) 20 MG capsule TAKE 1 CAPSULE DAILY FOR INDIGESTION AND HEARTBURN 90 capsule 3   potassium chloride SA (  KLOR-CON M20) 20 MEQ tablet [place] 1 tablet 2 x /day for Potassium 180 tablet 3   sildenafil (VIAGRA) 25 MG tablet Take 25 mg by mouth daily as needed for erectile dysfunction.     tadalafil (CIALIS) 20 MG tablet Take 1 tablet (20 mg total) by mouth daily as needed for erectile dysfunction. 60 tablet 3   TRULICITY 3 QP/5.9FM SOPN INJECT 3 MG UNDER THE SKIN ONCE A WEEK 6 mL 3   No current facility-administered medications on file prior to visit.   Allergies  Allergen Reactions   Crestor [Rosuvastatin] Cough   Family History  Problem Relation Age of Onset   Kidney cancer Father 30       kidney   Stroke Maternal Grandmother    Dementia Paternal Grandmother        Early 13s   Cancer Paternal Grandfather        Unsure of age or type   Cancer Paternal Uncle 1    PE: BP 140/82 (BP Location: Right Arm, Patient Position: Sitting, Cuff Size: Normal)   Pulse 77   Ht 6\' 1"  (1.854 m)   Wt 248 lb 6.4 oz (112.7 kg)   SpO2 97%   BMI 32.77 kg/m  Wt Readings from Last 3 Encounters:  08/13/20 248 lb 6.4 oz (112.7 kg)  05/04/20 247 lb 9.6 oz (112.3 kg)  04/23/20 247 lb (112 kg)   Constitutional: overweight, in NAD Eyes: PERRLA, EOMI, no exophthalmos ENT: moist mucous membranes, no thyromegaly, no cervical lymphadenopathy Cardiovascular: RRR, No MRG Respiratory: CTA B Gastrointestinal: abdomen soft, NT, ND, BS+ Musculoskeletal: no deformities, strength intact in all 4 Skin: moist, warm, no rashes Neurological: no tremor with outstretched hands, DTR normal in all 4  ASSESSMENT: 1. LADA,  insulin-dependent, uncontrolled, with complications - CKD stage 2 - ED  - he is interested in an insulin pump  - he has an elliptical machine at home  Component     Latest Ref Rng & Units 07/11/2016  Hemoglobin A1C      6.5  Glutamic Acid Decarb Ab     <5 IU/mL 47 (H)  Pancreatic Islet Cell Antibody     <5 JDF Units <5  C-Peptide     0.80 - 3.85 ng/mL 0.37 (L)  Glucose, Fasting     65 - 99 mg/dL 104 (H)   Elevated GAD antibodies and decreased insulin production. 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 HL  3.  Obesity class I BMI Classification: < 18.5 underweight  18.5-24.9 normal weight  25.0-29.9 overweight  30.0-34.9 class I obesity  35.0-39.9 class II obesity  ? 40.0 class III obesity   PLAN:  1. Patient with longstanding, uncontrolled, insulin-dependent diabetes (LADA), with improved control at last visit-HbA1c 6.8%.  At that time, reviewing his CGM, sugars are fairly stable overnight, they were slightly decreased after breakfast and they were dropping significantly before lunch.  As he had a smaller breakfast, I advised him to decrease the Humalog dose before this meal.  However, after lunch, sugars were increasing precipitously and they continued to worsen after dinner.  I advised him to use a larger dose of Humalog before lunch but did not change the insulin with dinner to avoid blood sugar drops overnight.  We also discussed about switching to [place] since this was then preferred by her insurance and I advised him to take 46 units daily  if he is able to start this.  We continue Trulicity and metformin.  She is tolerating these well, without side effects.  CGM interpretation: -At today's visit, we reviewed his CGM downloads: It appears that 60% of values are in target range (goal >70%), while 33% are higher than 180 (goal <25%), and 7% are lower than 70 (goal <4%).  The calculated  average blood sugar is 159.  The projected HbA1c for the next 3 months (GMI) is 7.1%. -Reviewing the CGM trends, it appears that his sugars are controlled overnight, they drop after breakfast but then increase after lunch, dinner, and snack after dinner.  Upon questioning, he is taking his Humalog late, is not able to injected 15 minutes before the meal so he ends up injecting it either at the start of the meal or later.  We discussed that this can lead to postprandial hyperglycemia, occasionally followed by a drastic decrease in blood sugars after an initial peak, as he sees after breakfast.  My suggestion for now would be to switch from Humalog to Lyumjev, which would give him more flexibility since it is injected at the start of the meal.  As the sugars are higher after lunch and dinner and he most always have to use the sliding scale with his meals, we will increase slightly the insulin with these meals.  - I advised him to:  Patient Instructions  Please continue: - Metformin ER 1000 mg in am and 500 mg at night - Trulicity 3 mg weekly - Tresiba 44 units daily  Please change from Humalog to: - Lyumjev - at the start of the meals: 6-8 units before b'fast 12-14 units before lunch 12-14 units before dinner Sliding scale for Humalog: - 141-160: + 2 units  - 161-180: + 3 units  - 181-200: + 4 units  - 201-240: + 5 units  - 241-280: + 6 units  - 281-320: + 7 units  - 321-360: + 8 units  - >361: + 10 units  Please return in 4 months.     - we checked his HbA1c: 7.4% (higher) - advised to check sugars at different times of the day - 4x a day, rotating check times - advised for yearly eye exams >> he is UTD - return to clinic in 4 months   2. HL -Reviewed latest lipid panel from 04/2020: All fractions excellent: Lab Results  Component Value Date   CHOL 130 04/23/2020   HDL 49 04/23/2020   LDLCALC 65 04/23/2020   TRIG 84 04/23/2020   CHOLHDL 2.7 04/23/2020  -Continues Lipitor 40 mg  daily without side effects  3.  Obesity class I -We will continue the GLP-1 receptor agonist (Trulicity 3 mg weekly), which should also help with weight loss -He gained 2 pounds before last visit and gained 1 pound since then   [name], MD PhD [facility] Endocrinology

## 2020-08-27 ENCOUNTER — Other Ambulatory Visit: Payer: Self-pay | Admitting: Internal Medicine

## 2020-08-27 MED ORDER — IPRATROPIUM BROMIDE 0.06 % NA SOLN: NASAL | 2 refills | Status: DC

## 2020-08-27 MED ORDER — PSEUDOEPHEDRINE HCL ER 120 MG PO TB12: ORAL_TABLET | ORAL | 0 refills | Status: DC

## 2020-08-27 MED ORDER — DEXAMETHASONE 2 MG PO TABS: ORAL_TABLET | ORAL | 0 refills | Status: DC

## 2020-08-29 ENCOUNTER — Other Ambulatory Visit: Payer: Self-pay | Admitting: Internal Medicine

## 2020-10-05 ENCOUNTER — Encounter: Payer: Self-pay | Admitting: Adult Health

## 2020-10-05 ENCOUNTER — Ambulatory Visit (INDEPENDENT_AMBULATORY_CARE_PROVIDER_SITE_OTHER): Payer: 59 | Admitting: Adult Health

## 2020-10-05 ENCOUNTER — Other Ambulatory Visit: Payer: Self-pay

## 2020-10-05 VITALS — BP 122/82 | HR 73 | Temp 97.3°F | Ht 73.5 in | Wt 246.6 lb

## 2020-10-05 DIAGNOSIS — Z1329 Encounter for screening for other suspected endocrine disorder: Secondary | ICD-10-CM

## 2020-10-05 DIAGNOSIS — E1169 Type 2 diabetes mellitus with other specified complication: Secondary | ICD-10-CM

## 2020-10-05 DIAGNOSIS — E139 Other specified diabetes mellitus without complications: Secondary | ICD-10-CM

## 2020-10-05 DIAGNOSIS — I10 Essential (primary) hypertension: Secondary | ICD-10-CM | POA: Diagnosis not present

## 2020-10-05 DIAGNOSIS — Z0000 Encounter for general adult medical examination without abnormal findings: Secondary | ICD-10-CM

## 2020-10-05 DIAGNOSIS — E662 Morbid (severe) obesity with alveolar hypoventilation: Secondary | ICD-10-CM

## 2020-10-05 DIAGNOSIS — Z79899 Other long term (current) drug therapy: Secondary | ICD-10-CM | POA: Diagnosis not present

## 2020-10-05 DIAGNOSIS — E785 Hyperlipidemia, unspecified: Secondary | ICD-10-CM

## 2020-10-05 DIAGNOSIS — N182 Chronic kidney disease, stage 2 (mild): Secondary | ICD-10-CM

## 2020-10-05 DIAGNOSIS — E1122 Type 2 diabetes mellitus with diabetic chronic kidney disease: Secondary | ICD-10-CM

## 2020-10-05 DIAGNOSIS — G4733 Obstructive sleep apnea (adult) (pediatric): Secondary | ICD-10-CM

## 2020-10-05 DIAGNOSIS — Z13220 Encounter for screening for lipoid disorders: Secondary | ICD-10-CM | POA: Diagnosis not present

## 2020-10-05 DIAGNOSIS — Z1389 Encounter for screening for other disorder: Secondary | ICD-10-CM

## 2020-10-05 DIAGNOSIS — I7121 Aneurysm of the ascending aorta, without rupture: Secondary | ICD-10-CM

## 2020-10-05 DIAGNOSIS — E559 Vitamin D deficiency, unspecified: Secondary | ICD-10-CM | POA: Diagnosis not present

## 2020-10-05 DIAGNOSIS — Z0001 Encounter for general adult medical examination with abnormal findings: Secondary | ICD-10-CM

## 2020-10-05 DIAGNOSIS — Z23 Encounter for immunization: Secondary | ICD-10-CM | POA: Diagnosis not present

## 2020-10-05 DIAGNOSIS — N521 Erectile dysfunction due to diseases classified elsewhere: Secondary | ICD-10-CM

## 2020-10-05 DIAGNOSIS — N401 Enlarged prostate with lower urinary tract symptoms: Secondary | ICD-10-CM

## 2020-10-05 DIAGNOSIS — K2100 Gastro-esophageal reflux disease with esophagitis, without bleeding: Secondary | ICD-10-CM

## 2020-10-05 DIAGNOSIS — Z136 Encounter for screening for cardiovascular disorders: Secondary | ICD-10-CM

## 2020-10-05 DIAGNOSIS — I493 Ventricular premature depolarization: Secondary | ICD-10-CM

## 2020-10-05 DIAGNOSIS — I712 Thoracic aortic aneurysm, without rupture: Secondary | ICD-10-CM

## 2020-10-05 NOTE — Patient Instructions (Addendum)
Harry [name] , Thank you for taking time to come for your Annual Wellness Visit. I appreciate your ongoing commitment to your health goals. Please review the following plan we discussed and let me know if I can assist you in the future.   These are the goals we discussed:  Goals      Exercise 150 min/wk Moderate Activity     Weight (lb) < 225 lb (102.1 kg)     Weigh in once a week and keep a log         This is a list of the screening recommended for you and due dates:  Health Maintenance  Topic Date Due   Zoster (Shingles) Vaccine (1 of 2) Never done   Complete foot exam   09/28/2020   Pneumonia vaccines (2 of 2 - PPSV23) 09/28/2020   Flu Shot  10/01/2020   Eye exam for diabetics  11/15/2020   COVID-19 Vaccine (5 - Booster for Moderna series) 11/24/2020   Hemoglobin A1C  02/12/2021   Colon Cancer Screening  12/22/2023   Tetanus Vaccine  03/16/2028   Hepatitis C Screening: USPSTF Recommendation to screen - Ages 18-79 yo.  Completed   HPV Vaccine  Aged Out     Shingrix - check with insurance - can get at [facility] - 2 shots 2-6 months apart,   Derm Dr. Janann August [phone]   Try hydrocolloidal dressing on nose for a few weeks first  Know what a healthy weight is for you (waist to hip ratio <0.95 for men)  Aim for 7+ servings of fruits and vegetables daily Greens, cruciferous, whole grains, beans, berries, nuts/seeds  65-80+ fluid ounces of water or unsweet tea for healthy kidneys  Limit to max 1 drink of alcohol per day, less is better; avoid smoking/tobacco  Limit animal fats in diet for cholesterol and heart health - choose grass fed whenever available  Avoid highly processed foods, and foods high in saturated/trans fats  Aim for low stress - take time to unwind and care for your mental health  Aim for 150 min of moderate intensity exercise weekly for heart health, and weights twice weekly for bone health  Aim for 7-9 hours of sleep  daily      High-Fiber Eating Plan Fiber, also called dietary fiber, is a type of carbohydrate. It is found foods such as fruits, vegetables, whole grains, and beans. A high-fiber diet can have many health benefits. Your health care provider may recommend a high-fiber diet to help: Prevent constipation. Fiber can make your bowel movements more regular. Lower your cholesterol. Relieve the following conditions: Inflammation of veins in the anus (hemorrhoids). Inflammation of specific areas of the digestive tract (uncomplicated diverticulosis). A problem of the large intestine, also called the colon, that sometimes causes pain and diarrhea (irritable bowel syndrome, or IBS). Prevent overeating as part of a weight-loss plan. Prevent heart disease, type 2 diabetes, and certain cancers. What are tips for following this plan? Reading food labels  Check the nutrition facts label on food products for the amount of dietary fiber. Choose foods that have 5 grams of fiber or more per serving. The goals for recommended daily fiber intake include: Men (age 51 or younger): 34-38 g. Men (over age 63): 28-34 g. Women (age 29 or younger): 25-28 g. Women (over age 62): 22-25 g. Your daily fiber goal is _____________ g. Shopping Choose whole fruits and vegetables instead of processed forms, such as apple juice or applesauce. Choose a wide variety  of high-fiber foods such as avocados, lentils, oats, and kidney beans. Read the nutrition facts label of the foods you choose. Be aware of foods with added fiber. These foods often have high sugar and sodium amounts per serving. Cooking Use whole-grain flour for baking and cooking. Cook with brown rice instead of white rice. Meal planning Start the day with a breakfast that is high in fiber, such as a cereal that contains 5 g of fiber or more per serving. Eat breads and cereals that are made with whole-grain flour instead of refined flour or white flour. Eat  brown rice, bulgur wheat, or millet instead of white rice. Use beans in place of meat in soups, salads, and pasta dishes. Be sure that half of the grains you eat each day are whole grains. General information You can get the recommended daily intake of dietary fiber by: Eating a variety of fruits, vegetables, grains, nuts, and beans. Taking a fiber supplement if you are not able to take in enough fiber in your diet. It is better to get fiber through food than from a supplement. Gradually increase how much fiber you consume. If you increase your intake of dietary fiber too quickly, you may have bloating, cramping, or gas. Drink plenty of water to help you digest fiber. Choose high-fiber snacks, such as berries, raw vegetables, nuts, and popcorn. What foods should I eat? Fruits Berries. Pears. Apples. Oranges. Avocado. Prunes and raisins. Dried figs. Vegetables Sweet potatoes. Spinach. Kale. Artichokes. Cabbage. Broccoli. Cauliflower.Green peas. Carrots. Squash. Grains Whole-grain breads. Multigrain cereal. Oats and oatmeal. Brown rice. Barley.Bulgur wheat. [place]. Quinoa. Bran muffins. Popcorn. Rye wafer crackers. Meats and other proteins Navy beans, kidney beans, and pinto beans. Soybeans. Split peas. Lentils. Nutsand seeds. Dairy Fiber-fortified yogurt. Beverages Fiber-fortified soy milk. Fiber-fortified orange juice. Other foods Fiber bars. The items listed above may not be a complete list of recommended foods and beverages. Contact a dietitian for more information. What foods should I avoid? Fruits Fruit juice. Cooked, strained fruit. Vegetables Fried potatoes. Canned vegetables. Well-cooked vegetables. Grains White bread. Pasta made with refined flour. White rice. Meats and other proteins Fatty cuts of meat. Fried chicken or fried fish. Dairy Milk. Yogurt. Cream cheese. Sour cream. Fats and oils Butters. Beverages Soft drinks. Other foods Cakes and pastries. The items  listed above may not be a complete list of foods and beverages to avoid. Talk with your dietitian about what choices are best for you. Summary Fiber is a type of carbohydrate. It is found in foods such as fruits, vegetables, whole grains, and beans. A high-fiber diet has many benefits. It can help to prevent constipation, lower blood cholesterol, aid weight loss, and reduce your risk of heart disease, diabetes, and certain cancers. Increase your intake of fiber gradually. Increasing fiber too quickly may cause cramping, bloating, and gas. Drink plenty of water while you increase the amount of fiber you consume. The best sources of fiber include whole fruits and vegetables, whole grains, nuts, seeds, and beans. This information is not intended to replace advice given to you by your health care provider. Make sure you discuss any questions you have with your healthcare provider. Document Revised: 06/23/2019 Document Reviewed: 06/23/2019 Elsevier Patient Education  2022 [employer].

## 2020-10-05 NOTE — Progress Notes (Signed)
Complete Physical  Assessment and Plan:  Encounter for Annual Physical Exam with abnormal findings Due annually  Health Maintenance reviewed Healthy lifestyle reviewed and goals set Pneumonia boosted Check about shingrix   Essential hypertension - continue medications, DASH diet, exercise and monitor at home. Call if greater than 130/80.  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  - Lipid panel  Vitamin D deficiency - Vit D  25 hydroxy (rtn osteoporosis monitoring)   Gastroesophageal reflux disease with esophagitis Continue PPI/H2 blocker, diet discussed   Hypoventilation syndrome Sleep apnea- continue CPAP, weight loss advised.    Ventral hernia without obstruction or gangrene monitor  BPH (benign prostatic hypertrophy) Symptoms stable, urology follows, continue sildenafil   Erectile dysfunction associated with type 2 diabetes mellitus Sildenafil PRN  Elevated PSA Last back to normal Now following with [facility]; just had PSA They are planning 6 month follow up   Ascending aorta aneurysm Control blood pressure, cholesterol, glucose, increase exercise.  Continue cardio follow up, Harry [name] is planning ECHO  12/2020  Post viral cough Mild, intermittent; try antihistamine, mucinex, sudafed Try flonase for congestion, post nasal drip Voice rest, treat cough Doesn't tolerate steroid, try breztri 1 puff BID sample given, gargle/rinse Try GERD treatment if not resolving, follow up if not improved or getting worse   Orders Placed This Encounter  Procedures   Pneumococcal polysaccharide vaccine 23-valent greater than or equal to 2yo subcutaneous/IM   CBC with Differential/Platelet   COMPLETE METABOLIC PANEL WITH GFR   Magnesium   Lipid panel   TSH   VITAMIN D 25 Hydroxy (Vit-D Deficiency, Fractures)   Urinalysis, Routine w reflex microscopic   Microalbumin / creatinine urine ratio   EKG 12-Lead    Harry [name] follows q89m will follow [id number] for BP/lipids as well controlled. Follow up sooner if concerns.    Discussed med's effects and SE's. Screening labs and tests as requested with regular follow-up as recommended. Over 40 minutes of exam, counseling, chart review and critical decision making was performed  Future Appointments  Date Time Provider [place]10/07/2020 11:30 AM [id number]  12/13/2020  3:20 PM Harry [name] [facility] None  04/09/2021 10:30 AM Harry [name] [facility] None  10/08/2021 10:00 AM Harry [name] [facility] None     HPI [id number].[id number]. male patient presents for a complete physical. has Ventral hernia - periumbilical [id number] obesity ([place]- BMI 30+ with OSA; Hyperlipidemia associated with type 2 diabetes mellitus ([facility] Essential hypertension; Vitamin D deficiency; GERD (gastroesophageal reflux disease); CKD stage 2 due to type 2 diabetes mellitus ([place] Obesity hypoventilation syndrome (HCC); BPH (benign prostatic hyperplasia); Erectile dysfunction associated with type 2 diabetes mellitus ([place] PVC's (premature ventricular contractions); Aneurysm, ascending aorta (HCC); LADA (latent autoimmune diabetes in  adults), managed as type 2 ([facility] Family history  of cerebrovascular disease; and OSA on CPAP on their problem list.   Wife passed away from cancer in 05-17-18 from breast cancer recurrence. He has 2 children, 2 grandkids. He works from office doing estimates.   He had URI 6 weeks ago, possible covid, completed zpak and steroid taper which mostly resolved but had to stop steroid early due to glucose, notes persistent intermittent ongoing cough, clear secretions, some nasal congestion. Taking sudafed -   He has OSA on CPAP, also with obesity hypoventilation syndrome and followed by neurology; he endorses 100% compliance with restorative sleep. He has xanax script for anxiety but rare use, last filled 30 tabs 01/02/2020.   BMI is Body mass index is 32.09 kg/m., he does somewhat watch his diet, light breakfast with oatmeal, English muffin, salad and chicken for lunch, etc; he admits to not exercising much, was walking 2-3 miles every other day.  Wt Readings from Last 3 Encounters:  10/05/20 246 lb 9.6 oz (111.9 kg)  08/13/20 248 lb 6.4 oz (112.7 kg)  05/04/20 247 lb 9.6 oz (112.3 kg)   He has known ascending aorta dilation, It was 4.2 cm in 2016, 4.1 cm per recent ECHO 11/2017 by cardiology, plan for follow up in 2 years. He had repeat stress test and ECHO in 11/2017 which were essentially normal. Follows with Dr. Skeet [name], reports has upcoming ECHO scheduled in Oct 2022.   His blood pressure has been controlled at home, today their BP is BP: 122/82 He does not workout. He denies chest pain, shortness of breath, dizziness.   He is on cholesterol medication (atorvastatin 40 mg daily) and denies myalgias. His cholesterol is at goal. The cholesterol last visit was:   Lab Results  Component Value Date   CHOL 130 04/23/2020   HDL 49 04/23/2020   LDLCALC 65 04/23/2020   TRIG 84 04/23/2020   CHOLHDL 2.7 04/23/2020   He has been working on diet for LADA managed as T2 diabetes, now has freestyle  [place], followed by Harry [name], he is on bASA, he is on ACE/ARB and denies foot ulcerations, increased appetite, nausea, paresthesia of the feet, polydipsia, polyuria, visual disturbances, vomiting and weight loss.  Fasting is running 80-140, peak remains <180 He is treated by  Metformin ER 1000 mg 2x a day twice a day Trulicity 3 mg weekly Humalog: 6-8 units AM, 10-14 with lunch/dinner Tyler Aas: 44-46 Fasting: generally 80-120 Last A1C in the office was:  Lab Results  Component Value Date   HGBA1C 7.5 (A) 08/13/2020   He has CKD II associated with T2DM. Last GFR: Lab Results  Component Value Date   GFRNONAA 83 04/23/2020    Patient is on Vitamin D supplement, taking 5000 IU daily  Lab Results  Component Value Date   VD25OH 55 04/23/2020     Last PSA was: Lab Results  Component Value Date   PSA 4.1 (H) 09/21/2018   PSA 3.2 08/04/2017   PSA 3.0 06/02/2016  He was referred to [facility] due PSAs trending up highest was 5.3, saw Dr. Jacalyn Lefevre recently 06/14/2020, had PSA 3.6, planning 6 month follow up. He endorses slow stream occasionally if he gets up late at night to urinate; typically 1-2 times in the evening.    Current Medications:  Current Outpatient Medications on File Prior to Visit  Medication Sig Dispense Refill   ALPRAZolam (XANAX) 1 MG tablet Take     1/2 to 1 tablet        2  to 3 x /day          as needed for Anxiety attack 30 tablet 0   aspirin 81 MG tablet Take 81 mg by mouth daily.     atorvastatin (LIPITOR) 40 MG tablet TAKE 1 TABLET DAILY FOR CHOLESTEROL 90 tablet 3   BD INSULIN SYRINGE U/F 31G X 5/16" 0.5 ML MISC INJECT INSULIN TWICE A DAY AS DIRECTED 200 each 3   bisoprolol-hydrochlorothiazide (ZIAC) 5-6.25 MG tablet TAKE 1 TABLET DAILY FOR BLOOD PRESSURE 90 tablet 3   Cholecalciferol (VITAMIN D) 125 MCG (5000 UT) CAPS Take 10,000 Units by mouth daily. 60 capsule 0   Continuous Blood Gluc Sensor (FREESTYLE LIBRE 14 DAY SENSOR) MISC USE FOR  CONTINUOUS BLOOD GLUCOSE MONITORING 6 each 3   glucose 4 GM chewable tablet Chew 1 tablet (4 g total) by mouth as needed for low blood sugar. 30 tablet 12   glucose blood (FREESTYLE TEST STRIPS) test strip Use 1- 2 times a day with the freestyle libre 14.  CGM glucometer 150 each 3   insulin degludec (TRESIBA FLEXTOUCH) 200 UNIT/ML FlexTouch Pen Inject under skin 10-15 units in am and 30 units at night (Patient taking differently: Inject 44-46 Units into the skin daily before breakfast. Inject under skin 10-15 units in am and 30 units at night) 9 mL 1   ipratropium (ATROVENT) 0.06 % nasal spray Use 1 to 2 sprays to each nostril 3 to 4 x/day as needed for Rhinorrhea 15 mL 2   losartan-hydrochlorothiazide (HYZAAR) 100-12.5 MG tablet TAKE 1 TABLET DAILY FOR BLOOD PRESSURE 90 tablet 3   Magnesium 500 MG TABS Take 1 tablet by mouth daily.     metFORMIN (GLUCOPHAGE-XR) 500 MG 24 hr tablet Take 2 tablets 2 x  /day with Meals for Diabetes 360 tablet 3   omeprazole (PRILOSEC) 20 MG capsule TAKE 1 CAPSULE DAILY FOR INDIGESTION AND HEARTBURN 90 capsule 3   potassium chloride SA (KLOR-CON M20) 20 MEQ tablet [place] 1 tablet 2 x /day for Potassium 180 tablet 3   pseudoephedrine (SUDAFED) 120 MG 12 hr tablet Take      1 tablet      2 x /day (every 12 hours)          for Head and Chest Congestion 60 tablet 0   sildenafil (VIAGRA) 25 MG tablet Take 25 mg by mouth daily as needed for erectile dysfunction.     TRULICITY 3 0000000 SOPN INJECT 3 MG UNDER THE SKIN ONCE A WEEK 6 mL 3   No current facility-administered medications on file prior to visit.   Allergies:  Allergies  Allergen Reactions   Crestor [Rosuvastatin] Cough   Health Maintenance:  Immunization History  Administered Date(s) Administered   Influenza Inj Mdck Quad With Preservative 12/19/2016   Influenza Split 11/28/2013, 12/06/2014   Influenza, High Dose Seasonal PF 12/23/2019   Influenza,inj,Quad PF,6+ Mos 12/23/2017   Influenza-Unspecified  12/19/2011, 01/04/2013, 01/01/2016, 12/23/2017   Moderna SARS-COV2 Booster Vaccination 07/24/2020   Moderna Sars-Covid-2 Vaccination 04/04/2019, 05/03/2019, 12/23/2019   Pneumococcal Conjugate-13 09/29/2019   Pneumococcal Polysaccharide-23 03/03/2006, 10/05/2020   Td 03/03/2008, 03/16/2018   Zoster, Live 02/07/2011   Tetanus: 03/2018 Pneumovax: 2008, TODAY Prevnar 13: 2021  Flu vaccine: 2019 Zostavax: 2012 - shingrix Covid 19: 2/2, 2021, moderna, booster x 2   Colonoscopy: 12/2013 (Dr. Cristina Gong) due 2025 Echo 11/2017 = has upcoming scheduled Stress test 11/2017 EGD: N/A  Eye Exam: Dr. Gershon Crane q yearly, last 11/16/2019, no retinopathy - he will  schedule soon  Dentist: Dr. Carman Ching, [place], last visit 2022, goes q69mDerm: several years ago   Patient Care Team: MUnk Pinto MD as PCP - General (Internal Medicine) SRutherford Guys MD as Consulting Physician (Ophthalmology) BRonald Lobo MD as Consulting Physician (Gastroenterology) MLarey Dresser MD as Consulting Physician (Cardiology) RSkeet Latch MD as Attending Physician (Cardiology) AJanann August MD as Referring Physician (Dermatology)  Medical History:  has Ventral hernia - periumbilical [id number] Morbid obesity ([place] - BMI 30+ with OSA; Hyperlipidemia associated with type 2 diabetes mellitus ([place]; Essential hypertension; Vitamin D deficiency; GERD (gastroesophageal reflux disease); CKD stage 2 due to type 2 diabetes mellitus ([facility]; Obesity hypoventilation syndrome (HCC); BPH (benign prostatic hyperplasia); Erectile dysfunction associated with type 2 diabetes mellitus ([place]; PVC's (premature ventricular contractions); Aneurysm, ascending aorta (HCC); LADA (latent autoimmune diabetes in adults), managed as type 2 ([place]; Family history of cerebrovascular disease; and OSA on CPAP on their problem list. Surgical History:  He  has a past surgical history that includes Anal fissure repair (2007); Ventral hernia repair  (04/13/12); and Tonsillectomy (Bilateral). Family History:  His family history includes Atrial fibrillation in his mother; Cancer in his paternal grandfather; Cancer (age of onset: 59 in his paternal uncle; Dementia in his paternal grandmother; Kidney cancer (age of onset: 541 in his father; Stroke in his maternal grandmother. Social History:   reports that he has never smoked. He has never used smokeless tobacco. He reports current alcohol use of about 3.0 - 5.0 standard drinks of alcohol per week. He reports that he does not use drugs. Review of Systems:  Review of Systems  Constitutional:  Negative for malaise/fatigue and weight loss.  HENT:  Positive for congestion. Negative for ear pain, hearing loss, sinus pain, sore throat and tinnitus.   Eyes:  Negative for blurred vision and double vision.  Respiratory:  Positive for cough (intermittent, clear secretions). Negative for sputum production, shortness of breath and wheezing.   Cardiovascular:  Negative for chest pain, palpitations, orthopnea, claudication, leg swelling and PND.  Gastrointestinal:  Negative for abdominal pain, blood in stool, constipation, diarrhea, heartburn, melena, nausea and vomiting.  Genitourinary: Negative.   Musculoskeletal:  Negative for falls, joint pain and myalgias.  Skin:  Negative for rash.  Neurological:  Negative for dizziness, tingling, sensory change, weakness and headaches.  Endo/Heme/Allergies:  Negative for polydipsia.  Psychiatric/Behavioral: Negative.  Negative for depression, memory loss, substance abuse and suicidal ideas. The patient is not nervous/anxious and does not have insomnia.   All other systems reviewed and are negative.  Physical Exam: Estimated body mass index is 32.09 kg/m as calculated from the following:   Height as of this encounter: 6' 1.5" (1.867 m).   Weight as of this encounter: 246 lb 9.6 oz (111.9 kg). BP 122/82   Pulse 73   Temp (!) 97.3 F (36.3 C)   Ht 6' 1.5" (1.867  m)   Wt 246 lb 9.6 oz (111.9 kg)   SpO2 97%   BMI 32.09 kg/m  General Appearance: Well nourished, in no apparent distress.  Eyes: PERRLA, EOMs, conjunctiva no swelling or erythema Sinuses: No Frontal/maxillary tenderness  ENT/Mouth: Ext aud canals clear, normal light reflex with TMs without erythema, bulging. Good dentition. No erythema, swelling, or exudate on post pharynx. Tonsils not swollen or erythematous. Hearing normal.  Neck: Supple, thyroid normal. No bruits  Respiratory: Respiratory effort normal, BS equal bilaterally without rales, rhonchi, wheezing or stridor.  Cardio: RRR without murmurs, rubs or gallops. Brisk peripheral pulses without  edema.  Chest: symmetric, with normal excursions and percussion.  Abdomen: Soft, obese abdomen, nontender, no guarding, rebound, masses, or organomegaly. He has easily reduced ventral hernia.  Lymphatics: Non tender without lymphadenopathy.  Genitourinary: defer to urology Musculoskeletal: Full ROM all peripheral extremities,5/5 strength, and normal gait.  Skin: Warm, dry without rashes, ecchymosis. He has dark brown, raised 4 mm x 12 mm lesion to R mid back with crusting, irregular borders and coloring to R mid back (has seen derm and cleared). Scabbed lesion to bridge of nose.  Neuro: Cranial nerves intact, reflexes equal bilaterally. Normal muscle tone, no cerebellar symptoms. Sensation intact.  Psych: Awake and oriented X 3, normal affect, Insight and Judgment appropriate.   EKG:  NSR, IRBBB  Harry [name] Junio 12:22 PM [facility]

## 2020-10-06 LAB — URINALYSIS, ROUTINE W REFLEX MICROSCOPIC
Bilirubin Urine: NEGATIVE
Glucose, UA: NEGATIVE
Hgb urine dipstick: NEGATIVE
Ketones, ur: NEGATIVE
Leukocytes,Ua: NEGATIVE
Nitrite: NEGATIVE
Protein, ur: NEGATIVE
Specific Gravity, Urine: 1.019 (ref 1.001–1.035)
pH: 5.5 (ref 5.0–8.0)

## 2020-10-06 LAB — COMPLETE METABOLIC PANEL WITH GFR
AG Ratio: 1.7 (calc) (ref 1.0–2.5)
ALT: 17 U/L (ref 9–46)
AST: 18 U/L (ref 10–35)
Albumin: 4 g/dL (ref 3.6–5.1)
Alkaline phosphatase (APISO): 74 U/L (ref 35–144)
BUN: 13 mg/dL (ref 7–25)
CO2: 30 mmol/L (ref 20–32)
Calcium: 9.3 mg/dL (ref 8.6–10.3)
Chloride: 102 mmol/L (ref 98–110)
Creat: 0.92 mg/dL (ref 0.70–1.35)
Globulin: 2.3 g/dL (calc) (ref 1.9–3.7)
Glucose, Bld: 65 mg/dL (ref 65–99)
Potassium: 3.7 mmol/L (ref 3.5–5.3)
Sodium: 141 mmol/L (ref 135–146)
Total Bilirubin: 1.1 mg/dL (ref 0.2–1.2)
Total Protein: 6.3 g/dL (ref 6.1–8.1)
eGFR: 92 mL/min/{1.73_m2} (ref >=60)

## 2020-10-06 LAB — MAGNESIUM: Magnesium: 1.8 mg/dL (ref 1.5–2.5)

## 2020-10-06 LAB — CBC WITH DIFFERENTIAL/PLATELET
Absolute Monocytes: 615 cells/uL (ref 200–950)
Basophils Absolute: 98 cells/uL (ref 0–200)
Basophils Relative: 1.2 %
Eosinophils Absolute: 107 cells/uL (ref 15–500)
Eosinophils Relative: 1.3 %
HCT: 47 % (ref 38.5–50.0)
Hemoglobin: 15.6 g/dL (ref 13.2–17.1)
Lymphs Abs: 1476 cells/uL (ref 850–3900)
MCH: 28.3 pg (ref 27.0–33.0)
MCHC: 33.2 g/dL (ref 32.0–36.0)
MCV: 85.3 fL (ref 80.0–100.0)
MPV: 10.7 fL (ref 7.5–12.5)
Monocytes Relative: 7.5 %
Neutro Abs: 5904 cells/uL (ref 1500–7800)
Neutrophils Relative %: 72 %
Platelets: 241 10*3/uL (ref 140–400)
RBC: 5.51 10*6/uL (ref 4.20–5.80)
RDW: 13 % (ref 11.0–15.0)
Total Lymphocyte: 18 %
WBC: 8.2 10*3/uL (ref 3.8–10.8)

## 2020-10-06 LAB — MICROALBUMIN / CREATININE URINE RATIO
Creatinine, Urine: 107 mg/dL (ref 20–320)
Microalb Creat Ratio: 2 mcg/mg creat (ref <30)
Microalb, Ur: 0.2 mg/dL

## 2020-10-06 LAB — LIPID PANEL
Cholesterol: 131 mg/dL (ref <200)
HDL: 49 mg/dL (ref >=40)
LDL Cholesterol (Calc): 64 mg/dL (calc)
Non-HDL Cholesterol (Calc): 82 mg/dL (calc) (ref <130)
Total CHOL/HDL Ratio: 2.7 (calc) (ref <5.0)
Triglycerides: 96 mg/dL (ref <150)

## 2020-10-06 LAB — VITAMIN D 25 HYDROXY (VIT D DEFICIENCY, FRACTURES): Vit D, 25-Hydroxy: 81 ng/mL (ref 30–100)

## 2020-10-06 LAB — TSH: TSH: 2.86 mIU/L (ref 0.40–4.50)

## 2020-10-15 ENCOUNTER — Other Ambulatory Visit: Payer: Self-pay | Admitting: Adult Health

## 2020-10-15 DIAGNOSIS — R0981 Nasal congestion: Secondary | ICD-10-CM

## 2020-11-13 ENCOUNTER — Other Ambulatory Visit: Payer: Self-pay | Admitting: Adult Health

## 2020-11-15 LAB — HM DIABETES EYE EXAM

## 2020-11-21 ENCOUNTER — Encounter: Payer: Self-pay | Admitting: Internal Medicine

## 2020-12-05 ENCOUNTER — Ambulatory Visit (HOSPITAL_COMMUNITY): Payer: 59 | Attending: Cardiovascular Disease

## 2020-12-05 ENCOUNTER — Other Ambulatory Visit: Payer: Self-pay

## 2020-12-05 DIAGNOSIS — I7121 Aneurysm of the ascending aorta, without rupture: Secondary | ICD-10-CM

## 2020-12-05 LAB — ECHOCARDIOGRAM COMPLETE: S' Lateral: 3.1 cm

## 2020-12-13 ENCOUNTER — Encounter: Payer: Self-pay | Admitting: Internal Medicine

## 2020-12-13 ENCOUNTER — Ambulatory Visit (INDEPENDENT_AMBULATORY_CARE_PROVIDER_SITE_OTHER): Payer: 59 | Admitting: Internal Medicine

## 2020-12-13 ENCOUNTER — Other Ambulatory Visit: Payer: Self-pay

## 2020-12-13 VITALS — BP 126/80 | HR 67 | Ht 73.0 in | Wt 248.6 lb

## 2020-12-13 DIAGNOSIS — E139 Other specified diabetes mellitus without complications: Secondary | ICD-10-CM

## 2020-12-13 DIAGNOSIS — E785 Hyperlipidemia, unspecified: Secondary | ICD-10-CM | POA: Diagnosis not present

## 2020-12-13 DIAGNOSIS — E1169 Type 2 diabetes mellitus with other specified complication: Secondary | ICD-10-CM

## 2020-12-13 DIAGNOSIS — E669 Obesity, unspecified: Secondary | ICD-10-CM

## 2020-12-13 DIAGNOSIS — E1369 Other specified diabetes mellitus with other specified complication: Secondary | ICD-10-CM

## 2020-12-13 LAB — POCT GLYCOSYLATED HEMOGLOBIN (HGB A1C): Hemoglobin A1C: 8 % — AB (ref 4.0–5.6)

## 2020-12-13 NOTE — Progress Notes (Signed)
Patient ID: Harry [name], male   DOB: Nov 03, 1955, 65 y.o.   MRN: [id number]   This visit occurred during the SARS-CoV-2 public health emergency.  Safety protocols were in place, including screening questions prior to the visit, additional usage of staff PPE, and extensive cleaning of exam room while observing appropriate contact time as indicated for disinfecting solutions.   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 Last visit 4 months ago.  Interim history: No increased urination, blurry vision, nausea, chest pain.  Reviewed HbA1c levels: Lab Results  Component Value Date   HGBA1C 7.5 (A) 08/13/2020   HGBA1C 6.8 (A) 05/04/2020   HGBA1C 7.4 (A) 02/03/2020   He is on: - Metformin ER 1000 mg in am and 500 mg at night - 2/2 diarrhea - Trulicity 1.5 >> 3 mg weekly - >> Tresiba 44 units at bedtime - Humalog (15 minutes before meals)  6-8 units before b'fast 10-12 >> 10-14 units before lunch (sandwich) 10-12 >> 10-14  units before dinner Correction at bedtime:4-5 units Sliding scale: - 141-160: + 2 units  - 161-180: + 3 units  - 181-200: + 4 units  - 201-240: + 5 units  - 241-280: + 6 units  - 281-320: + 7 units  - 321-360: + 8 units  - >361: + 10 units He had burning at the inj site with Lyumjev.  He checks Harry sugars more than 4 times a day with Harry freestyle libre CGM:   Prev.:   Previously:   Lowest sugar was 30s ... >> 40 >> 51 >> 50; he has hypoglycemia awareness in the 60s. Highest sugar was in the 381 >> 350 >> HI.  Glucometer: one touch ultra 2  Pt's meals are: - Breakfast: Glucerna, banana >> oatmeal >> English muffin or boiled egg or oatmeal - Lunch: eats out - salad with chicken; chicken burrito; sandwich; chick-fil-a >> sandwich with either tomatoes or peanut butter and jelly + hold milk -  Dinner: meat + veggie + starch - Snacks: potato chips, fruit, icecream No sodas.  -+ Mild CKD, last BUN/creatinine:  Lab Results  Component Value Date   BUN 13 10/05/2020   BUN 20 04/23/2020   CREATININE 0.92 10/05/2020   CREATININE 0.95 04/23/2020  On losartan.  -+ HL; last set of lipids: Lab Results  Component Value Date   CHOL 131 10/05/2020   HDL 49 10/05/2020   LDLCALC 64 10/05/2020   TRIG 96 10/05/2020   CHOLHDL 2.7 10/05/2020  On Lipitor 40.  - last eye exam was in 11/2020: No DR  -No numbness and tingling in Harry feet.  He also has a history of HTN, GERD, OSA-on CPAP.  Of note, Harry [name] passed away with cancer in 2018-04-28.  ROS: + See HPI  I reviewed pt's medications, allergies, PMH, social hx, family hx, and changes were documented in the history of present illness. Otherwise, unchanged from my initial visit note.  Past Medical History:  Diagnosis Date   Diabetes mellitus without complication ([place]) 9326   insulin requiring   Elevated PSA 09/22/2018   [facility] is following    GERD (gastroesophageal reflux disease)    Hyperlipidemia    Hypertension    Hypokalemia    Hypomagnesemia    OSA on CPAP    10-12 years ago   Vitamin D  deficiency    Past Surgical History:  Procedure Laterality Date   ANAL FISSURE REPAIR  2007   TONSILLECTOMY Bilateral    As a child   VENTRAL HERNIA REPAIR  04/13/12   Dr. Johney Maine, ventral hernia repair w/ mesh   Social History   Social History   Marital status: Married    Spouse name: N/A   Number of children: 2   Occupational History   estimator   Social History Main Topics   Smoking status: Never Smoker   Smokeless tobacco: Not on file   Alcohol use 0.0 oz/week    2 - 5 Cans of beer per week   Current Outpatient Medications on File Prior to Visit  Medication Sig Dispense Refill   ALPRAZolam (XANAX) 1 MG tablet Take     1/2 to 1 tablet        2 to 3 x /day          as needed for Anxiety attack 30 tablet 0    aspirin 81 MG tablet Take 81 mg by mouth daily.     atorvastatin (LIPITOR) 40 MG tablet TAKE 1 TABLET DAILY FOR CHOLESTEROL 90 tablet 3   BD INSULIN SYRINGE U/F 31G X 5/16" 0.5 ML MISC INJECT INSULIN TWICE A DAY AS DIRECTED 200 each 3   bisoprolol-hydrochlorothiazide (ZIAC) 5-6.25 MG tablet TAKE 1 TABLET DAILY FOR BLOOD PRESSURE 90 tablet 3   Cholecalciferol (VITAMIN D) 125 MCG (5000 UT) CAPS Take 10,000 Units by mouth daily. 60 capsule 0   Continuous Blood Gluc Sensor (FREESTYLE LIBRE 14 DAY SENSOR) MISC USE FOR CONTINUOUS BLOOD GLUCOSE MONITORING 6 each 3   glucose 4 GM chewable tablet Chew 1 tablet (4 g total) by mouth as needed for low blood sugar. 30 tablet 12   glucose blood (FREESTYLE TEST STRIPS) test strip Use 1- 2 times a day with the freestyle libre 14.  CGM glucometer 150 each 3   insulin degludec (TRESIBA FLEXTOUCH) 200 UNIT/ML FlexTouch Pen Inject under skin 10-15 units in am and 30 units at night (Patient taking differently: Inject 44-46 Units into the skin daily before breakfast. Inject under skin 10-15 units in am and 30 units at night) 9 mL 1   ipratropium (ATROVENT) 0.06 % nasal spray Use 1 to 2 sprays to each nostril 3 to 4 x/day as needed for Rhinorrhea 15 mL 2   losartan-hydrochlorothiazide (HYZAAR) 100-12.5 MG tablet TAKE 1 TABLET DAILY FOR BLOOD PRESSURE 90 tablet 3   Magnesium 500 MG TABS Take 1 tablet by mouth daily.     metFORMIN (GLUCOPHAGE-XR) 500 MG 24 hr tablet Take 2 tablets 2 x  /day with Meals for Diabetes 360 tablet 3   omeprazole (PRILOSEC) 20 MG capsule TAKE 1 CAPSULE DAILY FOR INDIGESTION AND HEARTBURN 90 capsule 3   potassium chloride SA (KLOR-CON M20) 20 MEQ tablet Takke 1 tablet 2 x /day for Potassium 180 tablet 3   pseudoephedrine (SUDAFED) 120 MG 12 hr tablet Take      1 tablet      2 x /day (every 12 hours)          for Head and Chest Congestion 60 tablet 0   sildenafil (VIAGRA) 25 MG tablet Take 25 mg by mouth daily as needed for erectile dysfunction.      TRULICITY 3 ZR/0.0TM SOPN INJECT 3 MG UNDER THE SKIN ONCE A WEEK 6 mL 3   No current facility-administered medications on file prior to visit.   Allergies  Allergen Reactions   Crestor [Rosuvastatin] Cough   Family History  Problem Relation Age of Onset   Atrial fibrillation Mother    Kidney cancer Father 22       kidney   Stroke Maternal Grandmother    Dementia Paternal Grandmother        Early 26s   Cancer Paternal Grandfather        Unsure of age or type   Cancer Paternal Uncle 69    PE: BP 126/80 (BP Location: Right Arm, Patient Position: Sitting, Cuff Size: Normal)   Pulse 67   Ht 6\' 1"  (1.854 m)   Wt 248 lb 9.6 oz (112.8 kg)   SpO2 97%   BMI 32.80 kg/m  Wt Readings from Last 3 Encounters:  12/13/20 248 lb 9.6 oz (112.8 kg)  10/05/20 246 lb 9.6 oz (111.9 kg)  08/13/20 248 lb 6.4 oz (112.7 kg)   Constitutional: overweight, in NAD Eyes: PERRLA, EOMI, no exophthalmos ENT: moist mucous membranes, no thyromegaly, no cervical lymphadenopathy Cardiovascular: RRR, No MRG Respiratory: CTA B Gastrointestinal: abdomen soft, NT, ND, BS+ Musculoskeletal: no deformities, strength intact in all 4 Skin: moist, warm, no rashes Neurological: no tremor with outstretched hands, DTR normal in all 4  ASSESSMENT: 1. LADA, insulin-dependent, uncontrolled, with complications - CKD stage 2 - ED  - he is interested in an insulin pump  - he has an elliptical machine at home  Component     Latest Ref Rng & Units 07/11/2016  Hemoglobin A1C      6.5  Glutamic Acid Decarb Ab     <5 IU/mL 47 (H)  Pancreatic Islet Cell Antibody     <5 JDF Units <5  C-Peptide     0.80 - 3.85 ng/mL 0.37 (L)  Glucose, Fasting     65 - 99 mg/dL 104 (H)   Elevated GAD antibodies and decreased insulin production. 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 HL  3.  Obesity class I BMI Classification: < 18.5  underweight  18.5-24.9 normal weight  25.0-29.9 overweight  30.0-34.9 class I obesity  35.0-39.9 class II obesity  ? 40.0 class III obesity   PLAN:  1. Patient with longstanding, uncontrolled, insulin-dependent diabetes (LADA) with improved control at the beginning of the year, but worsening control afterwards.  At last visit, HbA1c was higher, at 7.5%.  At that time, sugars were controlled overnight but they were dropping after breakfast only to increase after lunch, dinner and snack after dinner.  At that time, we increased Harry insulin with lunch and dinner.  We also tried to switch from Humalog to Lyumjev, which he tried but could not tolerate due to burning at the injection site. CGM interpretation: -At today's visit, we reviewed Harry CGM downloads: It appears that 36% of values are in target range (goal >70%), while 63% are higher than 180 (goal <25%), and 1% are lower than 70 (goal <4%).  The calculated average blood sugar is 208.  The projected HbA1c for the next 3 months (GMI) is 8.3%. -Reviewing the CGM trends, sugars are much worse than before.  Throughout the night, sugars are higher than target, mostly in the 200s range, they are dropping after breakfast and increasing significantly after lunch and especially after dinner.  Because the sugars are so high, he needs to bolus for correction at bedtime after which the sugars drop slightly, but still not  to the normal range. -He does not identify any factors that could have contributed to the worsening of Harry blood sugars.  At this visit, since sugars are mostly above target, I advised him to increase the dose of [place].  I also advised him to increase the insulin with lunch and dinner and to continue to increase these if the sugars do not improve.  Since he tells me that sugars in the morning are increasing even before breakfast, before going into the shower, I advised him to take Harry insulin actually before showering.  Of note, he has a small  breakfast, of oatmeal. - I advised him to:  Patient Instructions  Please continue: - Metformin ER 1000 mg in am and 500 mg at night - Trulicity 3 mg weekly  Change: - Tresiba 48 units daily - Humalog - 15 min before meals: 6-8 units before b'fast (before shower) 14-18 units before lunch 14-18 units before dinner Sliding scale: - 141-160: + 2 units  - 161-180: + 3 units  - 181-200: + 4 units  - 201-240: + 5 units  - 241-280: + 6 units  - 281-320: + 7 units  - 321-360: + 8 units  - >361: + 10 units  Please return in 3-4 months.    - we checked Harry HbA1c: 8% (higher) - advised to check sugars at different times of the day - 4x a day, rotating check times - advised for yearly eye exams >> he is UTD - return to clinic in 4 months   2. HL -Reviewed latest lipid panel from 04/2020: All fractions excellent: Lab Results  Component Value Date   CHOL 131 10/05/2020   HDL 49 10/05/2020   LDLCALC 64 10/05/2020   TRIG 96 10/05/2020   CHOLHDL 2.7 10/05/2020  -Continues Lipitor 40 mg daily without side effects  3.  Obesity class I -We will continue Trulicity which should also help with weight loss -Weight is stable at this visit   [name], MD PhD [facility] Endocrinology

## 2020-12-13 NOTE — Patient Instructions (Addendum)
Please continue: - Metformin ER 1000 mg in am and 500 mg at night - Trulicity 3 mg weekly  Change: - Tresiba 48 units daily - Humalog - 15 min before meals: 6-8 units before b'fast (before shower) 14-18 units before lunch 14-18 units before dinner Sliding scale: - 141-160: + 2 units  - 161-180: + 3 units  - 181-200: + 4 units  - 201-240: + 5 units  - 241-280: + 6 units  - 281-320: + 7 units  - 321-360: + 8 units  - >361: + 10 units  Please return in 3-4 months.

## 2020-12-14 ENCOUNTER — Ambulatory Visit (INDEPENDENT_AMBULATORY_CARE_PROVIDER_SITE_OTHER): Payer: 59 | Admitting: Cardiovascular Disease

## 2020-12-14 ENCOUNTER — Encounter (HOSPITAL_BASED_OUTPATIENT_CLINIC_OR_DEPARTMENT_OTHER): Payer: Self-pay | Admitting: Cardiovascular Disease

## 2020-12-14 VITALS — BP 120/64 | HR 67 | Ht 73.5 in | Wt 250.8 lb

## 2020-12-14 DIAGNOSIS — I10 Essential (primary) hypertension: Secondary | ICD-10-CM

## 2020-12-14 DIAGNOSIS — Z23 Encounter for immunization: Secondary | ICD-10-CM

## 2020-12-14 DIAGNOSIS — I7121 Aneurysm of the ascending aorta, without rupture: Secondary | ICD-10-CM | POA: Diagnosis not present

## 2020-12-14 DIAGNOSIS — Z9989 Dependence on other enabling machines and devices: Secondary | ICD-10-CM

## 2020-12-14 DIAGNOSIS — G4733 Obstructive sleep apnea (adult) (pediatric): Secondary | ICD-10-CM | POA: Diagnosis not present

## 2020-12-14 NOTE — Progress Notes (Signed)
Cardiology Office Note   Date:  12/14/2020   ID:  Harry [name], DOB 1955/08/08, MRN [id number]  PCP:  Unk Pinto, MD  Cardiologist:   Harry Latch, MD   No chief complaint on file.     History of Present Illness: Harry [name] is a 65 y.o. male with mild ascending aorta aneurysm, hypertension, hyperlipidemia, OSA on CPAP, PVCs, and diabetes who is here to re-establish care.  Harry [name] was previously a patient of Dr. Aundra [name].  He was last seen 05/22/2014.  He was initially referred due to an abnormal EKG and exertional dyspnea.  Exercise nuclear stress test 05/2014 was negative for ischemia but did show multiple runs of PVCs and short runs of NSVT.  He was subsequently referred for an echocardiogram 05/2014 that revealed LVEF 60 to 65% with grade 1 diastolic dysfunction.  He was noted to have a mild ascending aortic aneurysm measuring 4.2 cm. [name] reported feeling very fatigued.  He was referred for a Lexiscan Myoview 11/2014 that was negative for ischemia.  He also had a follow up echo for aortic aneurysm that was unchanged from prior.    At his last appointment his BP was slightly elevated but he wanted to work on diet and exercise.  He had a repeat echo 12/2020 that revealed LVEF 60 to 65% with moderate asymmetric LVH.  His ascending aorta was stable at 4.2 cm.  He does ballroom dancing twice per week.  He was walking but stopped due to the heat.  Lately he has been feeling well.  He has no exertional chest pain or shortness of breath.  He has no lower extremity edema, orthopnea, or PND.  He was struggling with congestion in his sinuses that has improved with pseudoephedrine.  He is no longer taking it.  He does complain of nocturia and is scheduled to see urology soon.  Past Medical History:  Diagnosis Date   Diabetes mellitus without complication ([place]) 7510   insulin requiring   Elevated PSA 09/22/2018   [facility] is following    GERD (gastroesophageal reflux  disease)    Hyperlipidemia    Hypertension    Hypokalemia    Hypomagnesemia    OSA on CPAP    10-12 years ago   Vitamin D deficiency     Past Surgical History:  Procedure Laterality Date   ANAL FISSURE REPAIR  2007   TONSILLECTOMY Bilateral    As a child   VENTRAL HERNIA REPAIR  04/13/12   Dr. Johney Maine, ventral hernia repair w/ mesh     Current Outpatient Medications  Medication Sig Dispense Refill   ALPRAZolam (XANAX) 1 MG tablet Take     1/2 to 1 tablet        2 to 3 x /day          as needed for Anxiety attack 30 tablet 0   aspirin 81 MG tablet Take 81 mg by mouth daily.     atorvastatin (LIPITOR) 40 MG tablet TAKE 1 TABLET DAILY FOR CHOLESTEROL 90 tablet 3   BD INSULIN SYRINGE U/F 31G X 5/16" 0.5 ML MISC INJECT INSULIN TWICE A DAY AS DIRECTED 200 each 3   bisoprolol-hydrochlorothiazide (ZIAC) 5-6.25 MG tablet TAKE 1 TABLET DAILY FOR BLOOD PRESSURE 90 tablet 3   Cholecalciferol (VITAMIN D) 125 MCG (5000 UT) CAPS Take 10,000 Units by mouth daily. 60 capsule 0   Continuous Blood Gluc Sensor (FREESTYLE LIBRE 14 DAY SENSOR) MISC USE FOR CONTINUOUS BLOOD  GLUCOSE MONITORING 6 each 3   glucose 4 GM chewable tablet Chew 1 tablet (4 g total) by mouth as needed for low blood sugar. 30 tablet 12   glucose blood (FREESTYLE TEST STRIPS) test strip Use 1- 2 times a day with the freestyle libre 14.  CGM glucometer 150 each 3   HUMALOG 100 UNIT/ML injection Inject into the skin. Sliding Scale     insulin degludec (TRESIBA FLEXTOUCH) 200 UNIT/ML FlexTouch Pen Inject under skin 10-15 units in am and 30 units at night (Patient taking differently: Inject 44-46 Units into the skin daily before breakfast. Inject under skin 10-15 units in am and 30 units at night) 9 mL 1   ipratropium (ATROVENT) 0.06 % nasal spray Use 1 to 2 sprays to each nostril 3 to 4 x/day as needed for Rhinorrhea 15 mL 2   losartan-hydrochlorothiazide (HYZAAR) 100-12.5 MG tablet TAKE 1 TABLET DAILY FOR BLOOD PRESSURE 90 tablet 3    Magnesium 500 MG TABS Take 1 tablet by mouth daily.     metFORMIN (GLUCOPHAGE-XR) 500 MG 24 hr tablet Take 2 tablets 2 x  /day with Meals for Diabetes 360 tablet 3   omeprazole (PRILOSEC) 20 MG capsule TAKE 1 CAPSULE DAILY FOR INDIGESTION AND HEARTBURN 90 capsule 3   potassium chloride SA (KLOR-CON M20) 20 MEQ tablet Takke 1 tablet 2 x /day for Potassium 180 tablet 3   sildenafil (VIAGRA) 25 MG tablet Take 25 mg by mouth daily as needed for erectile dysfunction.     TRULICITY 3 BW/4.6KZ SOPN INJECT 3 MG UNDER THE SKIN ONCE A WEEK 6 mL 3   No current facility-administered medications for this visit.    Allergies:   Crestor [rosuvastatin]    Social History:  The patient  reports that he has never smoked. He has never used smokeless tobacco. He reports current alcohol use of about 3.0 - 5.0 standard drinks per week. He reports that he does not use drugs.   Family History:  The patient's family history includes Atrial fibrillation in his mother; Cancer in his paternal grandfather; Cancer (age of onset: 19) in his paternal uncle; Dementia in his paternal grandmother; Kidney cancer (age of onset: 61) in his father; Stroke in his maternal grandmother.    ROS:  Please see the history of present illness.   Otherwise, review of systems are positive for none.   All other systems are reviewed and negative.    PHYSICAL EXAM: VS:  BP 120/64   Pulse 67   Ht 6' 1.5" (1.867 m)   Wt 250 lb 12.8 oz (113.8 kg)   SpO2 97%   BMI 32.64 kg/m  , BMI Body mass index is 32.64 kg/m. GENERAL:  Well appearing HEENT: Pupils equal round and reactive, fundi not visualized, oral mucosa unremarkable NECK:  No jugular venous distention, waveform within normal limits, carotid upstroke brisk and symmetric, no bruits LUNGS:  Clear to auscultation bilaterally HEART:  RRR.  PMI not displaced or sustained,S1 and S2 within normal limits, no S3, no S4, no clicks, no rubs, no murmurs ABD:  Flat, positive bowel sounds normal  in frequency in pitch, no bruits, no rebound, no guarding, no midline pulsatile mass, no hepatomegaly, no splenomegaly EXT:  2 plus pulses throughout, no edema, no cyanosis no clubbing SKIN:  No rashes no nodules NEURO:  Cranial nerves II through XII grossly intact, motor grossly intact throughout PSYCH:  Cognitively intact, oriented to person place and time   EKG:  EKG is ordered  today. 12/30/2018: Sinus rhythm.  Rate 65 bpm.  Incomplete right bundle branch block poor R wave progression.  Lexiscan Myoview 11/12/17: The left ventricular ejection fraction is normal (55-65%). Nuclear stress EF: 57%. This is a low risk study. There was no ST segment deviation noted during stress. The study is normal.   Low risk stress nuclear study with normal perfusion and normal left ventricular regional and global systolic function.    Echo 12/2020:  1. Left ventricular ejection fraction, by estimation, is 60 to 65%. The  left ventricle has normal function. The left ventricle has no regional  wall motion abnormalities. There is moderate asymmetric left ventricular  hypertrophy. Left ventricular  diastolic parameters were normal.   2. Right ventricular systolic function is normal. The right ventricular  size is normal.   3. The mitral valve is normal in structure. No evidence of mitral valve  regurgitation. No evidence of mitral stenosis.   4. The aortic valve is normal in structure. Aortic valve regurgitation is  not visualized.   5. Aortic dilatation noted. There is mild dilatation of the ascending  aorta, measuring 42 mm.    Recent Labs: 10/05/2020: ALT 17; BUN 13; Creat 0.92; Hemoglobin 15.6; Magnesium 1.8; Platelets 241; Potassium 3.7; Sodium 141; TSH 2.86    Lipid Panel    Component Value Date/Time   CHOL 131 10/05/2020 1116   TRIG 96 10/05/2020 1116   HDL 49 10/05/2020 1116   CHOLHDL 2.7 10/05/2020 1116   VLDL 40 (H) 06/02/2016 1541   LDLCALC 64 10/05/2020 1116      Wt Readings  from Last 3 Encounters:  12/14/20 250 lb 12.8 oz (113.8 kg)  12/13/20 248 lb 9.6 oz (112.8 kg)  10/05/20 246 lb 9.6 oz (111.9 kg)      ASSESSMENT AND PLAN:  # Exertional dyspnea:  Resolved.  Echo and Lexiscan Myoview are unremarkable 11/2017.    # Ascending aorta aneurysm: Stable at 4.2 cm.  Repeat echo in 2 years.  # Essential hypertension: Blood pressures well have been controlled.  Continue bisoprolol, HCTZ, losartan.  He was previously on doxazosin and now has more nocturia.  He is scheduled to see urology soon.  OK fir they want to add back the doxazosin.  # Hyperlipidemia: Lipids are well-controlled on atorvastatin.   Current medicines are reviewed at length with the patient today.  The patient does not have concerns regarding medicines.  The following changes have been made:  no change  Labs/ tests ordered today include:   No orders of the defined types were placed in this encounter.    Disposition:   FU with Generoso Cropper C. Oval Linsey, MD, [facility] in 2 years.    Signed, Kiylah Loyer C. Oval Linsey, MD, [facility]  12/14/2020 10:01 AM    [facility]

## 2020-12-14 NOTE — Patient Instructions (Signed)
Medication Instructions:  Your physician recommends that you continue on your current medications as directed. Please refer to the Current Medication list given to you today.   *If you need a refill on your cardiac medications before your next appointment, please call your pharmacy*  Lab Work: NONE   Testing/Procedures: Your physician has requested that you have an echocardiogram. Echocardiography is a painless test that uses sound waves to create images of your heart. It provides your doctor with information about the size and shape of your heart and how well your heart's chambers and valves are working. This procedure takes approximately one hour. There are no restrictions for this procedure.  2 YEARS PRIOR TO FOLLOW UP   Follow-Up: At [facility], you and your health needs are our priority.  As part of our continuing mission to provide you with exceptional heart care, we have created designated Provider Care Teams.  These Care Teams include your primary Cardiologist (physician) and Advanced Practice Providers (APPs -  Physician Assistants and Nurse Practitioners) who all work together to provide you with the care you need, when you need it.  We recommend signing up for the patient portal called "MyChart".  Sign up information is provided on this After Visit Summary.  MyChart is used to connect with patients for Virtual Visits (Telemedicine).  Patients are able to view lab/test results, encounter notes, upcoming appointments, etc.  Non-urgent messages can be sent to your provider as well.   To learn more about what you can do with MyChart, go to [email or web address].    Your next appointment:   2 month(s)  The format for your next appointment:   In Person  Provider:   Skeet Latch, MD

## 2020-12-16 ENCOUNTER — Encounter (HOSPITAL_BASED_OUTPATIENT_CLINIC_OR_DEPARTMENT_OTHER): Payer: Self-pay | Admitting: Cardiovascular Disease

## 2020-12-27 ENCOUNTER — Ambulatory Visit: Payer: 59 | Admitting: Nurse Practitioner

## 2020-12-27 ENCOUNTER — Emergency Department (HOSPITAL_BASED_OUTPATIENT_CLINIC_OR_DEPARTMENT_OTHER)
Admission: EM | Admit: 2020-12-27 | Discharge: 2020-12-27 | Disposition: A | Discharge status: Home / Self Care | Payer: 59 | Attending: Emergency Medicine | Admitting: Emergency Medicine

## 2020-12-27 ENCOUNTER — Other Ambulatory Visit: Payer: Self-pay

## 2020-12-27 ENCOUNTER — Encounter (HOSPITAL_BASED_OUTPATIENT_CLINIC_OR_DEPARTMENT_OTHER): Payer: Self-pay

## 2020-12-27 ENCOUNTER — Emergency Department (HOSPITAL_BASED_OUTPATIENT_CLINIC_OR_DEPARTMENT_OTHER): Payer: 59

## 2020-12-27 DIAGNOSIS — N182 Chronic kidney disease, stage 2 (mild): Secondary | ICD-10-CM | POA: Diagnosis not present

## 2020-12-27 DIAGNOSIS — Z794 Long term (current) use of insulin: Secondary | ICD-10-CM | POA: Diagnosis not present

## 2020-12-27 DIAGNOSIS — E1122 Type 2 diabetes mellitus with diabetic chronic kidney disease: Secondary | ICD-10-CM | POA: Insufficient documentation

## 2020-12-27 DIAGNOSIS — I129 Hypertensive chronic kidney disease with stage 1 through stage 4 chronic kidney disease, or unspecified chronic kidney disease: Secondary | ICD-10-CM | POA: Diagnosis not present

## 2020-12-27 DIAGNOSIS — Z7984 Long term (current) use of oral hypoglycemic drugs: Secondary | ICD-10-CM | POA: Diagnosis not present

## 2020-12-27 DIAGNOSIS — R0789 Other chest pain: Secondary | ICD-10-CM | POA: Diagnosis present

## 2020-12-27 DIAGNOSIS — Z7982 Long term (current) use of aspirin: Secondary | ICD-10-CM | POA: Insufficient documentation

## 2020-12-27 DIAGNOSIS — R079 Chest pain, unspecified: Secondary | ICD-10-CM

## 2020-12-27 LAB — BASIC METABOLIC PANEL
Anion gap: 8 (ref 5–15)
BUN: 13 mg/dL (ref 8–23)
CO2: 28 mmol/L (ref 22–32)
Calcium: 9.2 mg/dL (ref 8.9–10.3)
Chloride: 100 mmol/L (ref 98–111)
Creatinine, Ser: 1.03 mg/dL (ref 0.61–1.24)
GFR, Estimated: >60 mL/min (ref >60)
Glucose, Bld: 224 mg/dL — ABNORMAL HIGH (ref 70–99)
Potassium: 3.6 mmol/L (ref 3.5–5.1)
Sodium: 136 mmol/L (ref 135–145)

## 2020-12-27 LAB — CBC
HCT: 44.5 % (ref 39.0–52.0)
Hemoglobin: 15 g/dL (ref 13.0–17.0)
MCH: 27.9 pg (ref 26.0–34.0)
MCHC: 33.7 g/dL (ref 30.0–36.0)
MCV: 82.7 fL (ref 80.0–100.0)
Platelets: 204 10*3/uL (ref 150–400)
RBC: 5.38 MIL/uL (ref 4.22–5.81)
RDW: 13.1 % (ref 11.5–15.5)
WBC: 6.2 10*3/uL (ref 4.0–10.5)
nRBC: 0 % (ref 0.0–0.2)

## 2020-12-27 LAB — D-DIMER, QUANTITATIVE: D-Dimer, Quant: <0.27 ug/mL-FEU (ref 0.00–0.50)

## 2020-12-27 LAB — TROPONIN I (HIGH SENSITIVITY)
Troponin I (High Sensitivity): 3 ng/L (ref <18)
Troponin I (High Sensitivity): 4 ng/L (ref <18)

## 2020-12-27 NOTE — Discharge Instructions (Signed)
Continue your current medications.  Follow-up with your cardiologist to discuss further evaluation.  Return to the ER for worsening symptoms, other concerns

## 2020-12-27 NOTE — ED Triage Notes (Signed)
Pt presents w/non radiating Left chest pain and tingling to bilateral finger tips x2 weeks. Pt reports this started after echocardiogram

## 2020-12-27 NOTE — ED Provider Notes (Signed)
[facility] EMERGENCY DEPT Provider Note   CSN: [id number] Arrival date & time: 12/27/20  0272     History Chief Complaint  Patient presents with   Chest Pain    Harry [name] is a 65 y.o. male.   Chest Pain  HPI: A 65 year old patient with a history of treated diabetes, hypertension, hypercholesterolemia and obesity presents for evaluation of chest pain. Initial onset of pain was more than 6 hours ago. The patient's chest pain is worse with exertion. The patient's chest pain is middle- or left-sided, is not well-localized, is not described as heaviness/pressure/tightness, is not sharp and does not radiate to the arms/jaw/neck. The patient does not complain of nausea and denies diaphoresis. The patient has no history of stroke, has no history of peripheral artery disease, has not smoked in the past 90 days and has no relevant family history of coronary artery disease (first degree relative at less than age 32).  Patient states his symptoms have been ongoing for the last couple of weeks.  He contacted his doctor to schedule an appointment and they suggested he come to the ED for evaluation.  Patient does not have a history of heart disease but he has noticed that the symptoms get worse with exertion and activity.  He also noticed when he takes a deep breath it causes some discomfort. Past Medical History:  Diagnosis Date   Diabetes mellitus without complication ([place]) 5366   insulin requiring   Elevated PSA 09/22/2018   [facility] is following    GERD (gastroesophageal reflux disease)    Hyperlipidemia    Hypertension    Hypokalemia    Hypomagnesemia    OSA on CPAP    10-12 years ago   Vitamin D deficiency     Patient Active Problem List   Diagnosis Date Noted   OSA on CPAP 12/29/2017   Family history of cerebrovascular disease 11/29/2017   LADA (latent autoimmune diabetes in adults), managed as type 2 ([place]) 11/12/2015   PVC's (premature ventricular  contractions) 05/22/2014   Aneurysm, ascending aorta ([place]) 05/22/2014   BPH (benign prostatic hyperplasia) 04/06/2014   Erectile dysfunction associated with type 2 diabetes mellitus ([place]) 04/06/2014   CKD stage 2 due to type 2 diabetes mellitus ([place]) 02/17/2014   Obesity hypoventilation syndrome ([place]) 02/17/2014   Hyperlipidemia associated with type 2 diabetes mellitus ([place])    Essential hypertension    Vitamin D deficiency    GERD (gastroesophageal reflux disease)    Ventral hernia - periumbilical 4-4IH 47/42/5956   Morbid obesity ([place]) - BMI 30+ with OSA 02/04/2012    Past Surgical History:  Procedure Laterality Date   ANAL FISSURE REPAIR  2007   TONSILLECTOMY Bilateral    As a child   VENTRAL HERNIA REPAIR  04/13/12   Dr. Johney Maine, ventral hernia repair w/ mesh       Family History  Problem Relation Age of Onset   Atrial fibrillation Mother    Kidney cancer Father 67       kidney   Stroke Maternal Grandmother    Dementia Paternal Grandmother        Early 46s   Cancer Paternal Grandfather        Unsure of age or type   Cancer Paternal Uncle 25    Social History   Tobacco Use   Smoking status: Never   Smokeless tobacco: Never  Vaping Use   Vaping Use: Never used  Substance Use Topics   Alcohol use: Yes  Alcohol/week: 3.0 - 5.0 standard drinks    Types: 3 - 5 Standard drinks or equivalent per week    Comment: weekly   Drug use: No    Home Medications Prior to Admission medications   Medication Sig Start Date End Date Taking? Authorizing Provider  aspirin 81 MG tablet Take 81 mg by mouth daily.   Yes [provider]  atorvastatin (LIPITOR) 40 MG tablet TAKE 1 TABLET DAILY FOR CHOLESTEROL 08/29/20  Yes Unk Pinto, MD  bisoprolol-hydrochlorothiazide [facility]) 5-6.25 MG tablet TAKE 1 TABLET DAILY FOR BLOOD PRESSURE 03/27/20  Yes Liane Comber, NP  Cholecalciferol (VITAMIN D) 125 MCG (5000 UT) CAPS Take 10,000 Units by mouth daily. 09/22/18  Yes Liane Comber, NP  HUMALOG 100 UNIT/ML injection Inject into the skin. Sliding Scale 11/26/20  Yes [provider]  insulin degludec (TRESIBA FLEXTOUCH) 200 UNIT/ML FlexTouch Pen Inject under skin 10-15 units in am and 30 units at night Patient taking differently: Inject 44-46 Units into the skin daily before breakfast. Inject under skin 10-15 units in am and 30 units at night 04/04/20  Yes [name], MD  losartan-hydrochlorothiazide (HYZAAR) 100-12.5 MG tablet TAKE 1 TABLET DAILY FOR BLOOD PRESSURE 07/13/20  Yes Unk Pinto, MD  Magnesium 500 MG TABS Take 1 tablet by mouth daily.   Yes [provider]  metFORMIN (GLUCOPHAGE-XR) 500 MG 24 hr tablet Take 2 tablets 2 x  /day with Meals for Diabetes 01/22/20  Yes Unk Pinto, MD  omeprazole (PRILOSEC) 20 MG capsule TAKE 1 CAPSULE DAILY FOR INDIGESTION AND HEARTBURN 11/13/20  Yes Magda Bernheim, NP  potassium chloride SA (KLOR-CON M20) 20 MEQ tablet [place] 1 tablet 2 x /day for Potassium 01/22/20  Yes Unk Pinto, MD  TRULICITY 3 ER/1.5QM SOPN INJECT 3 MG UNDER THE SKIN ONCE A WEEK 06/11/20  Yes [name], MD  ALPRAZolam Duanne Moron) 1 MG tablet Take     1/2 to 1 tablet        2 to 3 x /day          as needed for Anxiety attack 01/02/20   Unk Pinto, MD  BD INSULIN SYRINGE U/F 31G X 5/16" 0.5 ML MISC INJECT INSULIN TWICE A DAY AS DIRECTED 10/11/19   Garnet Sierras, NP  Continuous Blood Gluc Sensor (FREESTYLE LIBRE 14 DAY SENSOR) MISC USE FOR CONTINUOUS BLOOD GLUCOSE MONITORING 08/08/20   [name], MD  glucose 4 GM chewable tablet Chew 1 tablet (4 g total) by mouth as needed for low blood sugar. 08/09/15   Vicie Mutters R, PA-C  glucose blood (FREESTYLE TEST STRIPS) test strip Use 1- 2 times a day with the freestyle libre 14.  CGM glucometer 09/23/19   [name], MD  ipratropium (ATROVENT) 0.06 % nasal spray Use 1 to 2 sprays to each nostril 3 to 4 x/day as needed for Rhinorrhea 08/27/20   Unk Pinto, MD   sildenafil (VIAGRA) 25 MG tablet Take 25 mg by mouth daily as needed for erectile dysfunction.    [provider]    Allergies    Crestor [rosuvastatin]  Review of Systems   Review of Systems  Cardiovascular:  Positive for chest pain.  All other systems reviewed and are negative.  Physical Exam Updated Vital Signs BP (!) 146/89   Pulse 67   Temp 98.3 F (36.8 C) (Oral)   Resp 18   Ht 1.867 m (6' 1.5")   Wt 113.8 kg   SpO2 100%   BMI 32.64 kg/m  Physical Exam Vitals and nursing note reviewed.  Constitutional:      General: He is not in acute distress.    Appearance: He is well-developed.  HENT:     Head: Normocephalic and atraumatic.     Right Ear: External ear normal.     Left Ear: External ear normal.  Eyes:     General: No scleral icterus.       Right eye: No discharge.        Left eye: No discharge.     Conjunctiva/sclera: Conjunctivae normal.  Neck:     Trachea: No tracheal deviation.  Cardiovascular:     Rate and Rhythm: Normal rate and regular rhythm.  Pulmonary:     Effort: Pulmonary effort is normal. No respiratory distress.     Breath sounds: Normal breath sounds. No stridor. No wheezing or rales.  Abdominal:     General: Bowel sounds are normal. There is no distension.     Palpations: Abdomen is soft.     Tenderness: There is no abdominal tenderness. There is no guarding or rebound.  Musculoskeletal:        General: No tenderness or deformity.     Cervical back: Neck supple.  Skin:    General: Skin is warm and dry.     Findings: No rash.  Neurological:     General: No focal deficit present.     Mental Status: He is alert.     Cranial Nerves: No cranial nerve deficit (no facial droop, extraocular movements intact, no slurred speech).     Sensory: No sensory deficit.     Motor: No abnormal muscle tone or seizure activity.     Coordination: Coordination normal.  Psychiatric:        Mood and Affect: Mood normal.    ED Results /  Procedures / Treatments   Labs (all labs ordered are listed, but only abnormal results are displayed) Labs Reviewed  BASIC METABOLIC PANEL - Abnormal; Notable for the following components:      Result Value   Glucose, Bld 224 (*)    All other components within normal limits  CBC  D-DIMER, QUANTITATIVE  TROPONIN I (HIGH SENSITIVITY)  TROPONIN I (HIGH SENSITIVITY)    EKG EKG Interpretation  Date/Time:  Thursday December 27 2020 09:58:28 EDT Ventricular Rate:  68 PR Interval:  182 QRS Duration: 104 QT Interval:  425 QTC Calculation: 452 R Axis:   23 Text Interpretation: Sinus rhythm No old tracing to compare Confirmed by Dorie Rank [phone]) on 12/27/2020 10:03:12 AM  Radiology DG Chest Portable 1 View  Result Date: 12/27/2020 CLINICAL DATA:  Chest pain EXAM: PORTABLE CHEST 1 VIEW COMPARISON:  04/06/2013 FINDINGS: The heart size and mediastinal contours are within normal limits. Both lungs are clear. The visualized skeletal structures are unremarkable. IMPRESSION: No active disease. Reading location: [place], [place]. Electronically Signed   By: Elmer Picker M.D.   On: 12/27/2020 10:25    Procedures Procedures   Medications Ordered in ED Medications - No data to display  ED Course  I have reviewed the triage vital signs and the nursing notes.  Pertinent labs & imaging results that were available during my care of the patient were reviewed by me and considered in my medical decision making (see chart for details).  Clinical Course as of 12/27/20 1439  Thu Dec 27, 2020  1126 D-dimer is negative.  Initial troponin is normal.  CBC metabolic panel unremarkable [JK]    Clinical Course User  Index [JK] Dorie Rank, MD   MDM Rules/Calculators/A&P HEAR Score: 5                         Patient presented to the ED for evaluation of chest pain.  No known history of coronary artery disease but he does have risk factors.  ED work-up is reassuring.  No signs of anemia.  No  pneumonia.  No pneumothorax.  Serial troponins are normal.  No signs of acute cardiac ischemia.  Patient states he has had stress test in the past that were negative at the last was a couple years ago.  D-dimer is also negative.  Doubt pulmonary embolism.  Patient appears stable for close outpatient cardiac follow-up and further evaluation.  Warning signs signs precautions discussed with the patient.  Patient is comfortable with this plan and agrees to close follow-up Final Clinical Impression(s) / ED Diagnoses Final diagnoses:  Chest pain, unspecified type    Rx / DC Orders ED Discharge Orders     None        Dorie Rank, MD 12/27/20 1439

## 2020-12-27 NOTE — ED Notes (Signed)
Pt dc home. Voiced understanding of dc instructions

## 2021-01-10 ENCOUNTER — Other Ambulatory Visit: Payer: Self-pay | Admitting: Internal Medicine

## 2021-01-10 DIAGNOSIS — E139 Other specified diabetes mellitus without complications: Secondary | ICD-10-CM

## 2021-01-15 ENCOUNTER — Other Ambulatory Visit: Payer: Self-pay | Admitting: Internal Medicine

## 2021-01-17 ENCOUNTER — Other Ambulatory Visit: Payer: Self-pay | Admitting: Urology

## 2021-01-17 DIAGNOSIS — R972 Elevated prostate specific antigen [PSA]: Secondary | ICD-10-CM

## 2021-01-21 ENCOUNTER — Telehealth: Payer: Self-pay | Admitting: Internal Medicine

## 2021-01-21 DIAGNOSIS — E139 Other specified diabetes mellitus without complications: Secondary | ICD-10-CM

## 2021-01-21 MED ORDER — TRESIBA FLEXTOUCH 200 UNIT/ML ~~LOC~~ SOPN: 44.0000 [IU] | PEN_INJECTOR | Freq: Every day | SUBCUTANEOUS | 0 refills | Status: DC

## 2021-01-21 NOTE — Telephone Encounter (Signed)
Pt request 14 days supply of TRESIBA FLEXTOUCH 200 UNIT/ML FlexTouch Pen be sent to  [id number] [address], [address] Phone:  [phone]  Fax:  [phone]    Due to Pt unable to receive RX from [facility] for 14 days

## 2021-01-21 NOTE — Telephone Encounter (Signed)
Rx sent to preferred pharmacy.

## 2021-01-30 ENCOUNTER — Telehealth: Payer: Self-pay | Admitting: Internal Medicine

## 2021-01-30 NOTE — Telephone Encounter (Signed)
PT called requesting his current A1C to be faxed to [phone] [facility].

## 2021-01-31 NOTE — Telephone Encounter (Signed)
Labs printed and faxed to [phone].

## 2021-02-08 ENCOUNTER — Telehealth: Payer: Self-pay | Admitting: Internal Medicine

## 2021-02-08 DIAGNOSIS — E139 Other specified diabetes mellitus without complications: Secondary | ICD-10-CM

## 2021-02-08 MED ORDER — TRESIBA FLEXTOUCH 200 UNIT/ML ~~LOC~~ SOPN: 44.0000 [IU] | PEN_INJECTOR | Freq: Every day | SUBCUTANEOUS | 1 refills | Status: DC

## 2021-02-08 NOTE — Telephone Encounter (Signed)
MEDICATION: TRESIBA FLEXTOUCH 200 UNIT/ML FlexTouch Pen  PHARMACY:   [place], [place] Phone:  [phone]  Fax:  [phone]      HAS THE PATIENT CONTACTED THEIR PHARMACY?  Yes-PHARM requires new RX  IS THIS A 90 DAY SUPPLY : Yes  IS PATIENT OUT OF MEDICATION: No  IF NOT; HOW MUCH IS LEFT: Approx. 3 weeks  LAST APPOINTMENT DATE: @11 /30/2022  NEXT APPOINTMENT DATE:@2 /17/2023  DO WE HAVE YOUR PERMISSION TO LEAVE A DETAILED MESSAGE?: Yes  OTHER COMMENTS:    **Let patient know to contact pharmacy at the end of the day to make sure medication is ready. **  ** Please notify patient to allow 48-72 hours to process**  **Encourage patient to contact the pharmacy for refills or they can request refills through [facility]**

## 2021-02-08 NOTE — Telephone Encounter (Signed)
Script has been sent.

## 2021-02-11 MED ORDER — TRESIBA FLEXTOUCH 200 UNIT/ML ~~LOC~~ SOPN: PEN_INJECTOR | SUBCUTANEOUS | 1 refills | Status: DC

## 2021-02-11 NOTE — Addendum Note (Signed)
Addended by: Lauralyn Primes on: 02/11/2021 03:33 PM   Modules accepted: Orders

## 2021-02-11 NOTE — Telephone Encounter (Signed)
Rx corrected and sent to preferred pharmacy.

## 2021-02-15 ENCOUNTER — Encounter: Payer: Self-pay | Admitting: Adult Health

## 2021-02-15 ENCOUNTER — Ambulatory Visit (INDEPENDENT_AMBULATORY_CARE_PROVIDER_SITE_OTHER): Payer: 59 | Admitting: Adult Health

## 2021-02-15 ENCOUNTER — Other Ambulatory Visit: Payer: Self-pay

## 2021-02-15 VITALS — BP 118/72 | HR 64 | Temp 97.7°F | Wt 256.0 lb

## 2021-02-15 DIAGNOSIS — R053 Chronic cough: Secondary | ICD-10-CM | POA: Diagnosis not present

## 2021-02-15 NOTE — Progress Notes (Signed)
Assessment and Plan:  Harry [name] was seen today for acute visit.  Diagnoses and all orders for this visit:  Persistent cough for 3 weeks or longer Following URI that has mostly resolved Discussed possible post viral cough, very mild bronchitis, fairly low suspicion of a pneumonia Discussed option to screen with chest xray in order to fully rule out CAP and need for abx, reasonable due to duration and mild fatigue He feels overall doing well, would like to try inhaler sample and cough suppression first over the weekend - given breztri sample, advised 1 puff bid, rinse/gargle after use Declined prescription cough med; advised to take med regularly for 3-4 days and then evaluate If at any point worsening despite above, any worsening fatigue, dyspnea, fever/chills call/message immediately and proceed with getting CXR as ordered. If acute onset recommend ED evaluation.  -     DG Chest 2 View; Future  Further disposition pending results of labs. Discussed med's effects and SE's.   Over 20 minutes of exam, counseling, chart review, and critical decision making was performed.   Future Appointments  Date Time Provider [place]  02/19/2021 11:20 AM GI-315 MR 3 [id number] [facility] [address] 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  He reports over Thanksgiving had a bad "chest cold" but without fever, dyspnea. That mostly resolved after 1 week. However he reports persistent intermittent cough, productive of small quantity of white phlegm, ongoing for 2 weeks since cold  resolution and not resolving. He does use delsym at home occasionally if cough is particularly persistent.   He has noted mild fatigue, sleeping more than usual. Denies wheezing, aching in chest or back, dyspnea.   He reports hx of similar last year, dx with bronchitis and was given inhaler sample that worked very well. He avoids oral steroids as this tends to caused hyperglycemia.   Past Medical History:  Diagnosis Date   Diabetes mellitus without complication ([place]) 2979   insulin requiring   Elevated PSA 09/22/2018   [facility] is following    GERD (gastroesophageal reflux disease)    Hyperlipidemia    Hypertension    Hypokalemia    Hypomagnesemia    OSA on CPAP    10-12 years ago   Vitamin D deficiency      Allergies  Allergen Reactions   Crestor [Rosuvastatin] Cough    Current Outpatient Medications on File Prior to Visit  Medication Sig   ALPRAZolam (XANAX) 1 MG tablet Take     1/2 to 1 tablet        2 to 3 x /day          as needed for Anxiety attack   aspirin 81 MG tablet Take 81 mg by mouth daily.   atorvastatin (LIPITOR) 40 MG tablet TAKE 1 TABLET DAILY FOR CHOLESTEROL   BD INSULIN SYRINGE U/F 31G X 5/16" 0.5 ML MISC INJECT INSULIN TWICE A DAY AS DIRECTED   bisoprolol-hydrochlorothiazide (ZIAC) 5-6.25 MG tablet TAKE 1 TABLET DAILY FOR BLOOD PRESSURE   Cholecalciferol (VITAMIN D) 125 MCG (5000 UT)  CAPS Take 10,000 Units by mouth daily.   Continuous Blood Gluc Sensor (FREESTYLE LIBRE 14 DAY SENSOR) MISC USE FOR CONTINUOUS BLOOD GLUCOSE MONITORING   glucose 4 GM chewable tablet Chew 1 tablet (4 g total) by mouth as needed for low blood sugar.   glucose blood (FREESTYLE TEST STRIPS) test strip Use 1- 2 times a day with the freestyle libre 14.  CGM glucometer   HUMALOG 100 UNIT/ML injection Inject into the skin. Sliding Scale   losartan-hydrochlorothiazide (HYZAAR) 100-12.5 MG tablet TAKE 1 TABLET DAILY FOR BLOOD PRESSURE   Magnesium 500 MG TABS Take 1 tablet by mouth  daily.   metFORMIN (GLUCOPHAGE-XR) 500 MG 24 hr tablet Take 2 tablets 2 x  /day with Meals for Diabetes   omeprazole (PRILOSEC) 20 MG capsule TAKE 1 CAPSULE DAILY FOR INDIGESTION AND HEARTBURN   potassium chloride SA (KLOR-CON M20) 20 MEQ tablet TAKE 1 TABLET TWICE A DAY FOR POTASSIUM   sildenafil (VIAGRA) 25 MG tablet Take 25 mg by mouth daily as needed for erectile dysfunction.   tamsulosin (FLOMAX) 0.4 MG CAPS capsule Take 0.4 mg by mouth daily.   TRESIBA FLEXTOUCH 200 UNIT/ML FlexTouch Pen Inject up to 48 units into the skin daily   TRULICITY 3 EX/5.1ZG SOPN INJECT 3 MG UNDER THE SKIN ONCE A WEEK   ipratropium (ATROVENT) 0.06 % nasal spray Use 1 to 2 sprays to each nostril 3 to 4 x/day as needed for Rhinorrhea   No current facility-administered medications on file prior to visit.    ROS: all negative except above.   Physical Exam:  BP 118/72    Pulse 64    Temp 97.7 F (36.5 C)    Wt 256 lb (116.1 kg)    SpO2 99%    BMI 33.32 kg/m   General Appearance: Well nourished, in no apparent distress. Eyes: PERRLA, EOMs, conjunctiva no swelling or erythema Sinuses: No Frontal/maxillary tenderness ENT/Mouth: Ext aud canals clear, TMs without erythema, bulging. No erythema, swelling, or exudate on post pharynx.  Tonsils not swollen or erythematous. Hearing normal.  Neck: Supple, thyroid normal.  Respiratory: Respiratory effort normal, BS present in all lobes with mild rales in bil bases R> L, does improve with cough; no rhonchi, wheezing or stridor.  Cardio: RRR with no MRGs. Brisk peripheral pulses without edema.  Abdomen: Soft, + BS.  Non tender, non-distended.  Lymphatics: Non tender without lymphadenopathy.  Musculoskeletal: normal gait.  Skin: Warm, dry without rashes, lesions, ecchymosis.  Neuro: Normal muscle tone Psych: Awake and oriented X 3, normal affect, Insight and Judgment appropriate.     Izora Ribas, NP 11:00 AM [facility]

## 2021-02-19 ENCOUNTER — Encounter: Payer: Self-pay | Admitting: Internal Medicine

## 2021-02-19 ENCOUNTER — Ambulatory Visit
Admission: RE | Admit: 2021-02-19 | Discharge: 2021-02-19 | Disposition: A | Discharge status: Home / Self Care | Payer: 59 | Source: Ambulatory Visit | Attending: Urology | Admitting: Urology

## 2021-02-19 ENCOUNTER — Other Ambulatory Visit: Payer: Self-pay

## 2021-02-19 DIAGNOSIS — E139 Other specified diabetes mellitus without complications: Secondary | ICD-10-CM

## 2021-02-19 DIAGNOSIS — R972 Elevated prostate specific antigen [PSA]: Secondary | ICD-10-CM

## 2021-02-19 MED ORDER — GADOBENATE DIMEGLUMINE 529 MG/ML IV SOLN
20.0000 mL | Freq: Once | INTRAVENOUS | Status: AC | PRN
  Administered 2021-02-19: 12:00:00 20 mL via INTRAVENOUS

## 2021-02-22 ENCOUNTER — Other Ambulatory Visit: Payer: Self-pay

## 2021-02-22 DIAGNOSIS — E139 Other specified diabetes mellitus without complications: Secondary | ICD-10-CM

## 2021-02-26 MED ORDER — TRESIBA FLEXTOUCH 200 UNIT/ML ~~LOC~~ SOPN: PEN_INJECTOR | SUBCUTANEOUS | 1 refills | Status: DC

## 2021-03-22 ENCOUNTER — Other Ambulatory Visit: Payer: Self-pay | Admitting: Adult Health

## 2021-04-05 NOTE — Progress Notes (Deleted)
3 MONTH FOLLOW UP   Assessment and Plan:   Essential hypertension - continue medications, DASH diet, exercise and monitor at home. Call if greater than 130/80.  -     CBC with Differential/Platelet -     CMP/GFR -     TSH  Aneurysm, ascending aorta ([place]) Follows with cardiology/vascular; follow up imaging due fall 2021 - reminded to schedule, phone number given *** Control blood pressure, cholesterol, glucose, increase exercise.   Obesity hypoventilation syndrome (HCC) Continue CPAP, has new machine. 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, recentlly very well controlled Discussed general issues about diabetes pathophysiology and management., Educational material distributed., Suggested low cholesterol diet., Encouraged aerobic exercise., Discussed foot care., Reminded to get yearly retinal exam. - A1C - he requests this be deferred to [name], has upcoming appointment ***  CKD stage 2 due to type 2 diabetes mellitus ([place]) Increase fluids, avoid NSAIDS, monitor sugars, will monitor CMP/GFR  Erectile dysfunction associated with type 2 diabetes mellitus ([place]) Glucose control recently improved, managing well with cialis PRN  Hyperlipidemia, unspecified hyperlipidemia type -continue medications for LDL goal <70, check lipids, decrease fatty foods, increase activity.  -     Lipid panel  Obesity with body mass index (BMI) of 30.0 to 34.9 - follow up 3 months for progress monitoring - increase veggies, decrease carbs - long discussion about weight loss, diet, and exercise  Medication management -     Magnesium  Discussed med's effects and SE's. Screening labs and tests as requested with regular follow-up as recommended. Future Appointments  Date Time Provider [place]  04/09/2021 10:30 AM Liane Comber, NP [facility] None  04/19/2021  3:40 PM [name], MD [facility] None  10/08/2021 10:00 AM Liane Comber, NP [facility]  None    HPI Patient presents for 3 month follow up for HTN, chol, DM with CKD, GERD, obesity, AAA.   He has OSA on CPAP, also with obesity hypoventilation syndrome and followed by neurology; he endorses 100% compliance with restorative sleep.    Prescribed xanax PRN, rare use, last filled 01/2020.   BMI is There is no height or weight on file to calculate BMI., he has been working on diet and exercise, has been walking, 1-2 hours per week, is working on cutting down on snacking in the evening, keeping bad foods out of the house, trying to increase veggies and less pasta and bread.  Wt Readings from Last 3 Encounters:  02/15/21 256 lb (116.1 kg)  12/27/20 250 lb 12.7 oz (113.8 kg)  12/14/20 250 lb 12.8 oz (113.8 kg)   He has known ascending aorta dilation, cardiology follows, stable at 4.2 cm. He had repeat stress test and ECHO in 11/2017 which were essentially normal He is following with Dr. Jacinto Reap with vascular q2 years.    His blood pressure has not been checked at home, today their BP is   He does workout. He denies chest pain, shortness of breath, dizziness.  He is on cholesterol medication, lipitor 40mg  and denies myalgias. His cholesterol is at goal of LDL <70. The cholesterol last visit was:   Lab Results  Component Value Date   CHOL 131 10/05/2020   HDL 49 10/05/2020   LDLCALC 64 10/05/2020   TRIG 96 10/05/2020   CHOLHDL 2.7 10/05/2020   He has been working on diet and exercise for insulin dependent/LADA diabetes with CKD stage 2, he has had DM x 30 + years, he is  on bASA, he is on ACE/ARB, metformin, trulicity 3 mg/week; he is now following with [name], Dr. Gershon Crane eye exam, last 11/22/2020 without retinopathy.   Will be switching from lantus to treseba and humalog sliding scale due to insurance changes. Hasn't started yet.  Denies paresthesia of the feet, polydipsia, polyuria and visual disturbances.   He reports fasting values range 80-120 Prior to lunch  80-110 Prior to dinner 100-180 He takes cialis 20 mg PRN for ED  Last A1C in the office was:   Lab Results  Component Value Date   HGBA1C 8.0 (A) 12/13/2020   He has CKD II associated with T2DM monitored q78m at this office, on losartan:  Lab Results  Component Value Date   GFRNONAA >60 12/27/2020   Patient is on Vitamin D supplement,  taking 5000 IU daily.   Lab Results  Component Value Date   VD25OH 4 10/05/2020   He is following with Dr. Claudia Desanctis due to hematuria and elevated PSAs, doing q69m PSAs there; planning MRI if trending up Lab Results  Component Value Date   PSA 4.1 (H) 09/21/2018   PSA 3.2 08/04/2017   PSA 3.0 06/02/2016     Current Medications:  Current Outpatient Medications on File Prior to Visit  Medication Sig Dispense Refill   ALPRAZolam (XANAX) 1 MG tablet Take     1/2 to 1 tablet        2 to 3 x /day          as needed for Anxiety attack 30 tablet 0   aspirin 81 MG tablet Take 81 mg by mouth daily.     atorvastatin (LIPITOR) 40 MG tablet TAKE 1 TABLET DAILY FOR CHOLESTEROL 90 tablet 3   BD INSULIN SYRINGE U/F 31G X 5/16" 0.5 ML MISC INJECT INSULIN TWICE A DAY AS DIRECTED 200 each 3   bisoprolol-hydrochlorothiazide (ZIAC) 5-6.25 MG tablet TAKE 1 TABLET DAILY FOR BLOOD PRESSURE 90 tablet 3   Cholecalciferol (VITAMIN D) 125 MCG (5000 UT) CAPS Take 10,000 Units by mouth daily. 60 capsule 0   Continuous Blood Gluc Sensor (FREESTYLE LIBRE 14 DAY SENSOR) MISC USE FOR CONTINUOUS BLOOD GLUCOSE MONITORING 6 each 3   glucose 4 GM chewable tablet Chew 1 tablet (4 g total) by mouth as needed for low blood sugar. 30 tablet 12   glucose blood (FREESTYLE TEST STRIPS) test strip Use 1- 2 times a day with the freestyle libre 14.  CGM glucometer 150 each 3   HUMALOG 100 UNIT/ML injection Inject into the skin. Sliding Scale     losartan-hydrochlorothiazide (HYZAAR) 100-12.5 MG tablet TAKE 1 TABLET DAILY FOR BLOOD PRESSURE 90 tablet 3   Magnesium 500 MG TABS Take 1 tablet by mouth  daily.     metFORMIN (GLUCOPHAGE-XR) 500 MG 24 hr tablet Take 2 tablets 2 x  /day with Meals for Diabetes 360 tablet 3   omeprazole (PRILOSEC) 20 MG capsule TAKE 1 CAPSULE DAILY FOR INDIGESTION AND HEARTBURN 90 capsule 3   potassium chloride SA (KLOR-CON M20) 20 MEQ tablet TAKE 1 TABLET TWICE A DAY FOR POTASSIUM 180 tablet 3   sildenafil (VIAGRA) 25 MG tablet Take 25 mg by mouth daily as needed for erectile dysfunction.     tamsulosin (FLOMAX) 0.4 MG CAPS capsule Take 0.4 mg by mouth daily.     TRESIBA FLEXTOUCH 200 UNIT/ML FlexTouch Pen Inject up to 48 units into the skin daily 9 mL 1   TRULICITY 3 ID/7.8EU SOPN INJECT 3 MG UNDER THE  SKIN ONCE A WEEK 6 mL 3   No current facility-administered medications on file prior to visit.    Allergies:  Allergies  Allergen Reactions   Crestor [Rosuvastatin] Cough   Medical History:  Past Medical History:  Diagnosis Date   Diabetes mellitus without complication ([place]) 3762   insulin requiring   Elevated PSA 09/22/2018   [facility] is following    GERD (gastroesophageal reflux disease)    Hyperlipidemia    Hypertension    Hypokalemia    Hypomagnesemia    OSA on CPAP    10-12 years ago   Vitamin D deficiency    Surgical History: reviewed and unchanged Family History: reviewed and unchanged Social History: reviewed and unchanged   Review of Systems:  Review of Systems  Constitutional: Negative.  Negative for malaise/fatigue and weight loss.  HENT: Negative.  Negative for hearing loss and tinnitus.   Eyes: Negative.  Negative for blurred vision and double vision.  Respiratory:  Negative for cough, hemoptysis, sputum production, shortness of breath and wheezing.   Cardiovascular: Negative.  Negative for chest pain, palpitations, orthopnea, claudication and leg swelling.  Gastrointestinal:  Negative for abdominal pain, blood in stool, constipation, diarrhea, melena, nausea and vomiting. Heartburn: better with PPI. Genitourinary:  Negative.   Musculoskeletal: Negative.  Negative for joint pain and myalgias.  Skin: Negative.  Negative for rash.  Neurological: Negative.  Negative for dizziness, tingling, sensory change, weakness and headaches.  Endo/Heme/Allergies: Negative.  Negative for polydipsia.  Psychiatric/Behavioral: Negative.    All other systems reviewed and are negative.  Physical Exam: Estimated body mass index is 33.32 kg/m as calculated from the following:   Height as of 12/27/20: 6' 1.5" (1.867 m).   Weight as of 02/15/21: 256 lb (116.1 kg). There were no vitals taken for this visit. General Appearance: Well nourished, in no apparent distress.  Eyes: PERRLA, EOMs, conjunctiva no swelling or erythema Sinuses: No Frontal/maxillary tenderness  ENT/Mouth: Ext aud canals clear, normal light reflex with TMs without erythema, bulging. No erythema, swelling, or exudate on post pharynx. Hearing normal.  Neck: Supple, thyroid normal. No bruits  Respiratory: Respiratory effort normal, BS equal bilaterally without rales, rhonchi, wheezing or stridor.  Cardio: RRR without murmurs, rubs or gallops, decreased HS due to body habitus. Brisk peripheral pulses without edema.  Chest: symmetric, with normal excursions and percussion.  Abdomen: Soft, nontender, obese, + ventral hernia, no guarding, rebound, masses, or organomegaly.  Lymphatics: Non tender without lymphadenopathy.  Musculoskeletal: Full ROM all peripheral extremities,5/5 strength, and normal gait.  Skin: Warm, dry without rashes, lesions, ecchymosis. Neuro: Cranial nerves intact, reflexes equal bilaterally. Normal muscle tone, no cerebellar symptoms. Sensation intact.  Psych: Awake and oriented X 3, normal affect, Insight and Judgment appropriate.   Gorden Harms Tarisa Paola 1:44 PM [facility]

## 2021-04-09 ENCOUNTER — Ambulatory Visit: Payer: 59 | Admitting: Adult Health

## 2021-04-09 DIAGNOSIS — E1169 Type 2 diabetes mellitus with other specified complication: Secondary | ICD-10-CM

## 2021-04-09 DIAGNOSIS — E139 Other specified diabetes mellitus without complications: Secondary | ICD-10-CM

## 2021-04-09 DIAGNOSIS — E559 Vitamin D deficiency, unspecified: Secondary | ICD-10-CM

## 2021-04-09 DIAGNOSIS — E1122 Type 2 diabetes mellitus with diabetic chronic kidney disease: Secondary | ICD-10-CM

## 2021-04-09 DIAGNOSIS — G4733 Obstructive sleep apnea (adult) (pediatric): Secondary | ICD-10-CM

## 2021-04-09 DIAGNOSIS — E662 Morbid (severe) obesity with alveolar hypoventilation: Secondary | ICD-10-CM

## 2021-04-09 DIAGNOSIS — I7121 Aneurysm of the ascending aorta, without rupture: Secondary | ICD-10-CM

## 2021-04-09 DIAGNOSIS — I10 Essential (primary) hypertension: Secondary | ICD-10-CM

## 2021-04-18 ENCOUNTER — Ambulatory Visit (INDEPENDENT_AMBULATORY_CARE_PROVIDER_SITE_OTHER): Payer: 59 | Admitting: Adult Health

## 2021-04-18 ENCOUNTER — Other Ambulatory Visit: Payer: Self-pay

## 2021-04-18 ENCOUNTER — Encounter: Payer: Self-pay | Admitting: Adult Health

## 2021-04-18 VITALS — BP 130/72 | HR 71 | Temp 97.7°F | Wt 258.2 lb

## 2021-04-18 DIAGNOSIS — Z79899 Other long term (current) drug therapy: Secondary | ICD-10-CM

## 2021-04-18 DIAGNOSIS — I10 Essential (primary) hypertension: Secondary | ICD-10-CM | POA: Diagnosis not present

## 2021-04-18 DIAGNOSIS — E1122 Type 2 diabetes mellitus with diabetic chronic kidney disease: Secondary | ICD-10-CM

## 2021-04-18 DIAGNOSIS — N182 Chronic kidney disease, stage 2 (mild): Secondary | ICD-10-CM

## 2021-04-18 DIAGNOSIS — E559 Vitamin D deficiency, unspecified: Secondary | ICD-10-CM

## 2021-04-18 DIAGNOSIS — G4733 Obstructive sleep apnea (adult) (pediatric): Secondary | ICD-10-CM

## 2021-04-18 DIAGNOSIS — I7121 Aneurysm of the ascending aorta, without rupture: Secondary | ICD-10-CM | POA: Diagnosis not present

## 2021-04-18 DIAGNOSIS — E785 Hyperlipidemia, unspecified: Secondary | ICD-10-CM

## 2021-04-18 DIAGNOSIS — E139 Other specified diabetes mellitus without complications: Secondary | ICD-10-CM

## 2021-04-18 DIAGNOSIS — N521 Erectile dysfunction due to diseases classified elsewhere: Secondary | ICD-10-CM

## 2021-04-18 DIAGNOSIS — E1169 Type 2 diabetes mellitus with other specified complication: Secondary | ICD-10-CM

## 2021-04-18 DIAGNOSIS — E662 Morbid (severe) obesity with alveolar hypoventilation: Secondary | ICD-10-CM

## 2021-04-18 MED ORDER — LOSARTAN POTASSIUM-HCTZ 100-25 MG PO TABS: ORAL_TABLET | ORAL | 3 refills | Status: DC

## 2021-04-18 NOTE — Progress Notes (Signed)
3 MONTH FOLLOW UP   Assessment and Plan:   Essential hypertension - well controlled but would like to reduce pill burden, plan to tritrate off of ziac due to risks with masking hypoglycemia, increase losartan to 100/25 and recheck in 4-6 weeks with BMP/GFR - DASH diet, exercise and monitor at home. Call if greater than 130/80.  -     CBC with Differential/Platelet -     CMP/GFR -     TSH  Aneurysm, ascending aorta ([place]) Follows with cardiology/vascular; UTD Control blood pressure, cholesterol, glucose, increase exercise.   Obesity hypoventilation syndrome (HCC) Continue CPAP, has new machine. Weight loss encouraged  LADA managed as T2DM ([place]) Managed by [name], recentlly very well controlled Discussed general issues about diabetes pathophysiology and management., Educational material distributed., Suggested low cholesterol diet., Encouraged aerobic exercise., Discussed foot care., Reminded to get yearly retinal exam. - A1C - he requests this be deferred to [name], has upcoming appointment   CKD stage 2 due to type 2 diabetes mellitus ([place]) Increase fluids, avoid NSAIDS, monitor sugars, will monitor CMP/GFR  Erectile dysfunction associated with type 2 diabetes mellitus ([place]) Glucose control recently improved, managing well with cialis PRN  Hyperlipidemia, unspecified hyperlipidemia type -continue medications for LDL goal <70, check lipids, decrease fatty foods, increase activity.  -     Lipid panel  Obesity with body mass index (BMI) of 30.0 to 34.9 - follow up 3 months for progress monitoring - increase veggies, decrease carbs - long discussion about weight loss, diet, and exercise  Medication management -     Magnesium  Discussed med's effects and SE's. Screening labs and tests as requested with regular follow-up as recommended. Future Appointments  Date Time Provider [place]  04/19/2021  3:40 PM [name], MD [facility] None  10/08/2021 10:00  AM Liane Comber, NP [facility] None    HPI Patient presents for 6 month follow up for HTN, chol, DM with CKD, GERD, obesity, AAA.   He has OSA on CPAP, also with obesity hypoventilation syndrome and followed by neurology; he endorses 100% compliance with restorative sleep.    Prescribed xanax PRN, rare use, last filled 01/2020.   BMI is Body mass index is 33.6 kg/m., he has been working on diet and exercise, has been walking, 1-2 hours per week.  Is working on cutting down on snacking in the evening, keeping bad foods out of the house, trying to increase veggies and less pasta and bread.  Wt Readings from Last 3 Encounters:  04/18/21 258 lb 3.2 oz (117.1 kg)  02/15/21 256 lb (116.1 kg)  12/27/20 250 lb 12.7 oz (113.8 kg)   He has known ascending aorta dilation, cardiology follows, stable at 4.2 cm. He had repeat stress test and ECHO in 11/2017 which were essentially normal He is following with Dr. Jacinto Reap with vascular q2 years.    His blood pressure has not been checked at home, today their BP is BP: 130/72 He does workout. He denies chest pain, shortness of breath, dizziness.  He is on cholesterol medication, lipitor 40mg  and denies myalgias. His cholesterol is at goal of LDL <70. The cholesterol last visit was:   Lab Results  Component Value Date   CHOL 131 10/05/2020   HDL 49 10/05/2020   LDLCALC 64 10/05/2020   TRIG 96 10/05/2020   CHOLHDL 2.7 10/05/2020   He has been working on diet and exercise for insulin dependent/LADA diabetes with CKD stage 2, he has had DM x  30 + years, he is on bASA, he is on ACE/ARB, metformin, trulicity 3 mg/week; he is now following with [name], Dr. Gershon Crane eye exam, last 11/22/2020 without retinopathy.   Now on treseba 48 units and humalog sliding scale  He reports has had a few low glucose late morning  Denies paresthesia of the feet, polydipsia, polyuria and visual disturbances.   He reports fasting values range 80-120 Prior to  lunch 80-110 Prior to dinner 110-150 He takes sildenafil 25 mg PRN for ED  Freestyle libre Last A1C in the office was:   Lab Results  Component Value Date   HGBA1C 8.0 (A) 12/13/2020   He has CKD II associated with T2DM monitored q18m at this office, on losartan:  Lab Results  Component Value Date   GFRNONAA >60 12/27/2020   Patient is on Vitamin D supplement,  taking 5000 IU daily.   Lab Results  Component Value Date   VD25OH 75 10/05/2020   He is following with Dr. Claudia Desanctis due to hematuria and elevated PSAs, doing q56m PSAs there; planning MRI if trending up Lab Results  Component Value Date   PSA 4.1 (H) 09/21/2018   PSA 3.2 08/04/2017   PSA 3.0 06/02/2016     Current Medications:  Current Outpatient Medications on File Prior to Visit  Medication Sig Dispense Refill   ALPRAZolam (XANAX) 1 MG tablet Take     1/2 to 1 tablet        2 to 3 x /day          as needed for Anxiety attack 30 tablet 0   aspirin 81 MG tablet Take 81 mg by mouth daily.     atorvastatin (LIPITOR) 40 MG tablet TAKE 1 TABLET DAILY FOR CHOLESTEROL 90 tablet 3   BD INSULIN SYRINGE U/F 31G X 5/16" 0.5 ML MISC INJECT INSULIN TWICE A DAY AS DIRECTED 200 each 3   bisoprolol-hydrochlorothiazide (ZIAC) 5-6.25 MG tablet TAKE 1 TABLET DAILY FOR BLOOD PRESSURE 90 tablet 3   Cholecalciferol (VITAMIN D) 125 MCG (5000 UT) CAPS Take 10,000 Units by mouth daily. 60 capsule 0   Continuous Blood Gluc Sensor (FREESTYLE LIBRE 14 DAY SENSOR) MISC USE FOR CONTINUOUS BLOOD GLUCOSE MONITORING 6 each 3   glucose 4 GM chewable tablet Chew 1 tablet (4 g total) by mouth as needed for low blood sugar. 30 tablet 12   glucose blood (FREESTYLE TEST STRIPS) test strip Use 1- 2 times a day with the freestyle libre 14.  CGM glucometer 150 each 3   HUMALOG 100 UNIT/ML injection Inject into the skin. Sliding Scale     losartan-hydrochlorothiazide (HYZAAR) 100-12.5 MG tablet TAKE 1 TABLET DAILY FOR BLOOD PRESSURE 90 tablet 3   Magnesium 500 MG  TABS Take 1 tablet by mouth daily.     metFORMIN (GLUCOPHAGE-XR) 500 MG 24 hr tablet Take 2 tablets 2 x  /day with Meals for Diabetes 360 tablet 3   omeprazole (PRILOSEC) 20 MG capsule TAKE 1 CAPSULE DAILY FOR INDIGESTION AND HEARTBURN 90 capsule 3   potassium chloride SA (KLOR-CON M20) 20 MEQ tablet TAKE 1 TABLET TWICE A DAY FOR POTASSIUM 180 tablet 3   sildenafil (VIAGRA) 25 MG tablet Take 25 mg by mouth daily as needed for erectile dysfunction.     tamsulosin (FLOMAX) 0.4 MG CAPS capsule Take 0.4 mg by mouth daily.     TRESIBA FLEXTOUCH 200 UNIT/ML FlexTouch Pen Inject up to 48 units into the skin daily 9 mL 1  TRULICITY 3 QQ/7.6PP SOPN INJECT 3 MG UNDER THE SKIN ONCE A WEEK 6 mL 3   No current facility-administered medications on file prior to visit.    Allergies:  Allergies  Allergen Reactions   Crestor [Rosuvastatin] Cough   Medical History:  Past Medical History:  Diagnosis Date   Diabetes mellitus without complication ([place]) 5093   insulin requiring   Elevated PSA 09/22/2018   [facility]    GERD (gastroesophageal reflux disease)    Hyperlipidemia    Hypertension    Hypokalemia    Hypomagnesemia    OSA on CPAP    10-12 years ago   Vitamin D deficiency    Surgical History: reviewed and unchanged Family History: reviewed and unchanged Social History: reviewed and unchanged   Review of Systems:  Review of Systems  Constitutional: Negative.  Negative for malaise/fatigue and weight loss.  HENT: Negative.  Negative for hearing loss and tinnitus.   Eyes: Negative.  Negative for blurred vision and double vision.  Respiratory:  Negative for cough, hemoptysis, sputum production, shortness of breath and wheezing.   Cardiovascular: Negative.  Negative for chest pain, palpitations, orthopnea, claudication and leg swelling.  Gastrointestinal:  Negative for abdominal pain, blood in stool, constipation, diarrhea, melena, nausea and vomiting. Heartburn: better  with PPI. Genitourinary: Negative.   Musculoskeletal: Negative.  Negative for joint pain and myalgias.  Skin: Negative.  Negative for rash.  Neurological: Negative.  Negative for dizziness, tingling, sensory change, weakness and headaches.  Endo/Heme/Allergies: Negative.  Negative for polydipsia.  Psychiatric/Behavioral: Negative.    All other systems reviewed and are negative.  Physical Exam: Estimated body mass index is 33.6 kg/m as calculated from the following:   Height as of 12/27/20: 6' 1.5" (1.867 m).   Weight as of this encounter: 258 lb 3.2 oz (117.1 kg). BP 130/72    Pulse 71    Temp 97.7 F (36.5 C)    Wt 258 lb 3.2 oz (117.1 kg)    SpO2 99%    BMI 33.60 kg/m  General Appearance: Well nourished, in no apparent distress.  Eyes: PERRLA, EOMs, conjunctiva no swelling or erythema Sinuses: No Frontal/maxillary tenderness  ENT/Mouth: Ext aud canals clear, normal light reflex with TMs without erythema, bulging. No erythema, swelling, or exudate on post pharynx. Hearing normal.  Neck: Supple, thyroid normal. No bruits  Respiratory: Respiratory effort normal, BS equal bilaterally without rales, rhonchi, wheezing or stridor.  Cardio: RRR without murmurs, rubs or gallops, decreased HS due to body habitus. Brisk peripheral pulses without edema.  Chest: symmetric, with normal excursions and percussion.  Abdomen: Soft, nontender, obese, + ventral hernia, no guarding, rebound, masses, or organomegaly.  Lymphatics: Non tender without lymphadenopathy.  Musculoskeletal: Full ROM all peripheral extremities,5/5 strength, and normal gait.  Skin: Warm, dry without rashes, lesions, ecchymosis. Neuro: Cranial nerves intact, reflexes equal bilaterally. Normal muscle tone, no cerebellar symptoms. Sensation intact.  Psych: Awake and oriented X 3, normal affect, Insight and Judgment appropriate.   Izora Ribas, NP-C 4:26 PM [facility]

## 2021-04-18 NOTE — Patient Instructions (Addendum)
Goals      Blood Pressure < 130/80     Exercise 150 min/wk Moderate Activity     Weight (lb) < 225 lb (102.1 kg)     Weigh in once a week and keep a log        Recommend reducing ziac (bisoprolol hydrochlorothiazide) to 1/2 tab for 1 week then stop   Start losartan/hydrocholoriazide new increased dose and monitor   Goal blood pressure is around <130/80  If persistently above 140/80 in 4-6 weeks we will want to adjust meds further     HYPERTENSION INFORMATION  Monitor your blood pressure at home, please keep a record and bring that in with you to your next office visit.   Go to the ER if any CP, SOB, nausea, dizziness, severe HA, changes vision/speech  Testing/Procedures: HOW TO TAKE YOUR BLOOD PRESSURE: Rest 5 minutes before taking your blood pressure. Dont smoke or drink caffeinated beverages for at least 30 minutes before. Take your blood pressure before (not after) you eat. Sit comfortably with your back supported and both feet on the floor (dont cross your legs). Elevate your arm to heart level on a table or a desk. Use the proper sized cuff. It should fit smoothly and snugly around your bare upper arm. There should be enough room to slip a fingertip under the cuff. The bottom edge of the cuff should be 1 inch above the crease of the elbow.  Your most recent BP: BP: 130/72   Take your medications faithfully as instructed. Maintain a healthy weight. Get at least 150 minutes of aerobic exercise per week. Minimize salt intake. Minimize alcohol intake  DASH Eating Plan DASH stands for "Dietary Approaches to Stop Hypertension." The DASH eating plan is a healthy eating plan that has been shown to reduce high blood pressure (hypertension). Additional health benefits may include reducing the risk of type 2 diabetes mellitus, heart disease, and stroke. The DASH eating plan may also help with weight loss. WHAT DO I NEED TO KNOW ABOUT THE DASH EATING PLAN? For the DASH  eating plan, you will follow these general guidelines: Choose foods with a percent daily value for sodium of less than 5% (as listed on the food label). Use salt-free seasonings or herbs instead of table salt or sea salt. Check with your health care provider or pharmacist before using salt substitutes. Eat lower-sodium products, often labeled as "lower sodium" or "no salt added." Eat fresh foods. Eat more vegetables, fruits, and low-fat dairy products. Choose whole grains. Look for the word "whole" as the first word in the ingredient list. Choose fish and skinless chicken or [place] more often than red meat. Limit fish, poultry, and meat to 6 oz (170 g) each day. Limit sweets, desserts, sugars, and sugary drinks. Choose heart-healthy fats. Limit cheese to 1 oz (28 g) per day. Eat more home-cooked food and less restaurant, buffet, and fast food. Limit fried foods. Cook foods using methods other than frying. Limit canned vegetables. If you do use them, rinse them well to decrease the sodium. When eating at a restaurant, ask that your food be prepared with less salt, or no salt if possible. WHAT FOODS CAN I EAT? Seek help from a dietitian for individual calorie needs. Grains Whole grain or whole wheat bread. Brown rice. Whole grain or whole wheat pasta. Quinoa, bulgur, and whole grain cereals. Low-sodium cereals. Corn or whole wheat flour tortillas. Whole grain cornbread. Whole grain crackers. Low-sodium crackers. Vegetables Fresh or frozen vegetables (  raw, steamed, roasted, or grilled). Low-sodium or reduced-sodium tomato and vegetable juices. Low-sodium or reduced-sodium tomato sauce and paste. Low-sodium or reduced-sodium canned vegetables.  Fruits All fresh, canned (in natural juice), or frozen fruits. Meat and Other Protein Products Ground beef (85% or leaner), grass-fed beef, or beef trimmed of fat. Skinless chicken or [place]. Ground chicken or [place]. Pork trimmed of fat. All fish and  seafood. Eggs. Dried beans, peas, or lentils. Unsalted nuts and seeds. Unsalted canned beans. Dairy Low-fat dairy products, such as skim or 1% milk, 2% or reduced-fat cheeses, low-fat ricotta or cottage cheese, or plain low-fat yogurt. Low-sodium or reduced-sodium cheeses. Fats and Oils Tub margarines without trans fats. Light or reduced-fat mayonnaise and salad dressings (reduced sodium). Avocado. Safflower, olive, or canola oils. Natural peanut or almond butter. Other Unsalted popcorn and pretzels. The items listed above may not be a complete list of recommended foods or beverages. Contact your dietitian for more options. WHAT FOODS ARE NOT RECOMMENDED? Grains White bread. White pasta. White rice. Refined cornbread. Bagels and croissants. Crackers that contain trans fat. Vegetables Creamed or fried vegetables. Vegetables in a cheese sauce. Regular canned vegetables. Regular canned tomato sauce and paste. Regular tomato and vegetable juices. Fruits Dried fruits. Canned fruit in light or heavy syrup. Fruit juice. Meat and Other Protein Products Fatty cuts of meat. Ribs, chicken wings, bacon, sausage, bologna, salami, chitterlings, fatback, hot dogs, bratwurst, and packaged luncheon meats. Salted nuts and seeds. Canned beans with salt. Dairy Whole or 2% milk, cream, half-and-half, and cream cheese. Whole-fat or sweetened yogurt. Full-fat cheeses or blue cheese. Nondairy creamers and whipped toppings. Processed cheese, cheese spreads, or cheese curds. Condiments Onion and garlic salt, seasoned salt, table salt, and sea salt. Canned and packaged gravies. Worcestershire sauce. Tartar sauce. Barbecue sauce. Teriyaki sauce. Soy sauce, including reduced sodium. Steak sauce. Fish sauce. Oyster sauce. Cocktail sauce. Horseradish. Ketchup and mustard. Meat flavorings and tenderizers. Bouillon cubes. Hot sauce. Tabasco sauce. Marinades. Taco seasonings. Relishes. Fats and Oils Butter, stick margarine,  lard, shortening, ghee, and bacon fat. Coconut, palm kernel, or palm oils. Regular salad dressings. Other Pickles and olives. Salted popcorn and pretzels. The items listed above may not be a complete list of foods and beverages to avoid. Contact your dietitian for more information. WHERE CAN I FIND MORE INFORMATION? National Heart, Lung, and Blood Institute: travelstabloid.com Document Released: 02/06/2011 Document Revised: 07/04/2013 Document Reviewed: 12/22/2012 [facility] Patient Information 2015 [place], [place]. This information is not intended to replace advice given to you by your health care provider. Make sure you discuss any questions you have with your health care provider.

## 2021-04-19 ENCOUNTER — Ambulatory Visit (INDEPENDENT_AMBULATORY_CARE_PROVIDER_SITE_OTHER): Payer: 59 | Admitting: Internal Medicine

## 2021-04-19 ENCOUNTER — Encounter: Payer: Self-pay | Admitting: Internal Medicine

## 2021-04-19 VITALS — BP 140/78 | HR 70 | Ht 73.5 in | Wt 256.2 lb

## 2021-04-19 DIAGNOSIS — E139 Other specified diabetes mellitus without complications: Secondary | ICD-10-CM

## 2021-04-19 DIAGNOSIS — E785 Hyperlipidemia, unspecified: Secondary | ICD-10-CM | POA: Diagnosis not present

## 2021-04-19 DIAGNOSIS — E1169 Type 2 diabetes mellitus with other specified complication: Secondary | ICD-10-CM

## 2021-04-19 LAB — CBC WITH DIFFERENTIAL/PLATELET
Absolute Monocytes: 647 cells/uL (ref 200–950)
Basophils Absolute: 92 cells/uL (ref 0–200)
Basophils Relative: 1.4 %
Eosinophils Absolute: 178 cells/uL (ref 15–500)
Eosinophils Relative: 2.7 %
HCT: 46.7 % (ref 38.5–50.0)
Hemoglobin: 15.4 g/dL (ref 13.2–17.1)
Lymphs Abs: 1214 cells/uL (ref 850–3900)
MCH: 27.9 pg (ref 27.0–33.0)
MCHC: 33 g/dL (ref 32.0–36.0)
MCV: 84.8 fL (ref 80.0–100.0)
MPV: 11.2 fL (ref 7.5–12.5)
Monocytes Relative: 9.8 %
Neutro Abs: 4468 cells/uL (ref 1500–7800)
Neutrophils Relative %: 67.7 %
Platelets: 196 10*3/uL (ref 140–400)
RBC: 5.51 10*6/uL (ref 4.20–5.80)
RDW: 13.1 % (ref 11.0–15.0)
Total Lymphocyte: 18.4 %
WBC: 6.6 10*3/uL (ref 3.8–10.8)

## 2021-04-19 LAB — COMPLETE METABOLIC PANEL WITH GFR
AG Ratio: 1.5 (calc) (ref 1.0–2.5)
ALT: 17 U/L (ref 9–46)
AST: 17 U/L (ref 10–35)
Albumin: 3.8 g/dL (ref 3.6–5.1)
Alkaline phosphatase (APISO): 78 U/L (ref 35–144)
BUN: 14 mg/dL (ref 7–25)
CO2: 28 mmol/L (ref 20–32)
Calcium: 9.3 mg/dL (ref 8.6–10.3)
Chloride: 104 mmol/L (ref 98–110)
Creat: 1 mg/dL (ref 0.70–1.35)
Globulin: 2.6 g/dL (calc) (ref 1.9–3.7)
Glucose, Bld: 148 mg/dL — ABNORMAL HIGH (ref 65–99)
Potassium: 3.5 mmol/L (ref 3.5–5.3)
Sodium: 140 mmol/L (ref 135–146)
Total Bilirubin: 0.7 mg/dL (ref 0.2–1.2)
Total Protein: 6.4 g/dL (ref 6.1–8.1)
eGFR: 82 mL/min/{1.73_m2} (ref >=60)

## 2021-04-19 LAB — POCT GLYCOSYLATED HEMOGLOBIN (HGB A1C): Hemoglobin A1C: 7.3 % — AB (ref 4.0–5.6)

## 2021-04-19 LAB — MAGNESIUM: Magnesium: 1.8 mg/dL (ref 1.5–2.5)

## 2021-04-19 LAB — LIPID PANEL
Cholesterol: 136 mg/dL (ref <200)
HDL: 46 mg/dL (ref >=40)
LDL Cholesterol (Calc): 63 mg/dL (calc)
Non-HDL Cholesterol (Calc): 90 mg/dL (calc) (ref <130)
Total CHOL/HDL Ratio: 3 (calc) (ref <5.0)
Triglycerides: 203 mg/dL — ABNORMAL HIGH (ref <150)

## 2021-04-19 LAB — TSH: TSH: 1.92 mIU/L (ref 0.40–4.50)

## 2021-04-19 NOTE — Progress Notes (Signed)
Patient ID: Harry [name], male   DOB: [birth year], 66 y.o.   MRN: [id number]   This visit occurred during the SARS-CoV-2 public health emergency.  Safety protocols were in place, including screening questions prior to the visit, additional usage of staff PPE, and extensive cleaning of exam room while observing appropriate contact time as indicated for disinfecting solutions.   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 Last visit 4 months ago.  Interim history: No increased urination, blurry vision, nausea, chest pain. Since last visit, he had a prostate MRI due to an increased PSA.  He was started on a medication for enlarged prostate. He recently started to change his diet, reducing sweets and snacks.  Reviewed HbA1c levels: Lab Results  Component Value Date   HGBA1C 8.0 (A) 12/13/2020   HGBA1C 7.5 (A) 08/13/2020   HGBA1C 6.8 (A) 05/04/2020   He is on: - Metformin ER 1000 >> 500 mg in am and 500 mg at night - 1-2 weeks ago, 2/2 diarrhea - Trulicity 1.5 >> 3 mg weekly - Tresiba 44 >> 48 units at bedtime - Humalog (15 minutes before meals)  6-8 units before b'fast >> 5-6 before shower 10-12 >> 10-14 >> 10-15 units before lunch (sandwich) 10-12 >> 10-14 >> 12-18 units before dinner Correction at bedtime:4-5 units Sliding scale: - 141-160: + 2 units  - 161-180: + 3 units  - 181-200: + 4 units  - 201-240: + 5 units  - 241-280: + 6 units  - 281-320: + 7 units  - 321-360: + 8 units  - >361: + 10 units He had burning at the inj site with Lyumjev. Previously on Lantus.  He checks his sugars more than 4 times a day with his freestyle libre CGM:   Previously:   Prev.:   Lowest sugar was 30s ... >> 40 >> 51 >> 50 >> 48; he has hypoglycemia awareness in the 60s. Highest sugar was in the 381 >> 350 >> HI >>  300s.  Glucometer: one touch ultra 2  Pt's meals are: - Breakfast: Glucerna, banana >> oatmeal >> English muffin or boiled egg or oatmeal - Lunch: eats out - salad with chicken; chicken burrito; sandwich; chick-fil-a >> sandwich with either tomatoes or peanut butter and jelly + hold milk - Dinner: meat + veggie + starch - Snacks: potato chips, fruit, icecream No sodas.  -+ Mild CKD, last BUN/creatinine:  Lab Results  Component Value Date   BUN 14 2021/05/04   BUN 13 12/27/2020   CREATININE 1.00 May 04, 2021   CREATININE 1.03 12/27/2020  On losartan.  -+ HL; last set of lipids: Lab Results  Component Value Date   CHOL 136 05/04/2021   HDL 46 05-04-2021   LDLCALC 63 05/04/2021   TRIG 203 (H) May 04, 2021   CHOLHDL 3.0 05/04/21  On Lipitor 40.  - last eye exam was in 11/2020: No DR  -No numbness and tingling in his feet.  Up-to-date with foot exam.  He also has a history of HTN, GERD, OSA-on CPAP.  Of note, his wife passed away with cancer in May 04, 2018.  ROS: + See HPI  I reviewed pt's medications, allergies, PMH, social hx, family hx, and changes were documented in the history of present illness. Otherwise, unchanged from my initial visit note.  Past Medical History:  Diagnosis Date  Diabetes mellitus without complication ([place]) 1696   insulin requiring   Elevated PSA 09/22/2018   [facility] is following    GERD (gastroesophageal reflux disease)    Hyperlipidemia    Hypertension    Hypokalemia    Hypomagnesemia    OSA on CPAP    10-12 years ago   Vitamin D deficiency    Past Surgical History:  Procedure Laterality Date   ANAL FISSURE REPAIR  2007   TONSILLECTOMY Bilateral    As a child   VENTRAL HERNIA REPAIR  04/13/12   Dr. Johney [place], ventral hernia repair w/ mesh   Social History   Social History   Marital status: Married    Spouse name: N/A   Number of children: 2   Occupational History   estimator   Social History Main Topics   Smoking  status: Never Smoker   Smokeless tobacco: Not on file   Alcohol use 0.0 oz/week    2 - 5 Cans of beer per week   Current Outpatient Medications on File Prior to Visit  Medication Sig Dispense Refill   ALPRAZolam (XANAX) 1 MG tablet Take     1/2 to 1 tablet        2 to 3 x /day          as needed for Anxiety attack 30 tablet 0   aspirin 81 MG tablet Take 81 mg by mouth daily.     atorvastatin (LIPITOR) 40 MG tablet TAKE 1 TABLET DAILY FOR CHOLESTEROL 90 tablet 3   BD INSULIN SYRINGE U/F 31G X 5/16" 0.5 ML MISC INJECT INSULIN TWICE A DAY AS DIRECTED 200 each 3   Cholecalciferol (VITAMIN D) 125 MCG (5000 UT) CAPS Take 10,000 Units by mouth daily. 60 capsule 0   Continuous Blood Gluc Sensor (FREESTYLE LIBRE 14 DAY SENSOR) MISC USE FOR CONTINUOUS BLOOD GLUCOSE MONITORING 6 each 3   finasteride (PROSCAR) 5 MG tablet Take 5 mg by mouth daily.     glucose 4 GM chewable tablet Chew 1 tablet (4 g total) by mouth as needed for low blood sugar. 30 tablet 12   glucose blood (FREESTYLE TEST STRIPS) test strip Use 1- 2 times a day with the freestyle libre 14.  CGM glucometer 150 each 3   HUMALOG 100 UNIT/ML injection Inject into the skin. Sliding Scale     losartan-hydrochlorothiazide (HYZAAR) 100-25 MG tablet Take 1 tab daily in the morning for blood pressure goal <130/80. 90 tablet 3   Magnesium 500 MG TABS Take 1 tablet by mouth daily.     metFORMIN (GLUCOPHAGE-XR) 500 MG 24 hr tablet Take 2 tablets 2 x  /day with Meals for Diabetes 360 tablet 3   omeprazole (PRILOSEC) 20 MG capsule TAKE 1 CAPSULE DAILY FOR INDIGESTION AND HEARTBURN 90 capsule 3   potassium chloride SA (KLOR-CON M20) 20 MEQ tablet TAKE 1 TABLET TWICE A DAY FOR POTASSIUM 180 tablet 3   sildenafil (VIAGRA) 25 MG tablet Take 25 mg by mouth daily as needed for erectile dysfunction.     tamsulosin (FLOMAX) 0.4 MG CAPS capsule Take 0.4 mg by mouth daily.     TRESIBA FLEXTOUCH 200 UNIT/ML FlexTouch Pen Inject up to 48 units into the skin daily  9 mL 1   TRULICITY 3 VE/9.3YB SOPN INJECT 3 MG UNDER THE SKIN ONCE A WEEK 6 mL 3   No current facility-administered medications on file prior to visit.   Allergies  Allergen Reactions   Crestor [Rosuvastatin] Cough  Family History  Problem Relation Age of Onset   Atrial fibrillation Mother    Kidney cancer Father 62       kidney   Stroke Maternal Grandmother    Dementia Paternal Grandmother        Early 74s   Cancer Paternal Grandfather        Unsure of age or type   Cancer Paternal Uncle 68    PE: BP 140/78 (BP Location: Right Arm, Patient Position: Sitting, Cuff Size: Normal)    Pulse 70    Ht 6' 1.5" (1.867 m)    Wt 256 lb 3.2 oz (116.2 kg)    SpO2 97%    BMI 33.34 kg/m  Wt Readings from Last 3 Encounters:  04/19/21 256 lb 3.2 oz (116.2 kg)  04/18/21 258 lb 3.2 oz (117.1 kg)  02/15/21 256 lb (116.1 kg)   Constitutional: overweight, in NAD Eyes: PERRLA, EOMI, no exophthalmos ENT: moist mucous membranes, no thyromegaly, no cervical lymphadenopathy Cardiovascular: RRR, No MRG Respiratory: CTA B Musculoskeletal: no deformities, strength intact in all 4 Skin: moist, warm, no rashes Neurological: no tremor with outstretched hands, DTR normal in all 4  ASSESSMENT: 1. LADA, insulin-dependent, uncontrolled, with complications - CKD stage 2 - ED  - he is interested in an insulin pump  - he has an elliptical machine at home  Component     Latest Ref Rng & Units 07/11/2016  Hemoglobin A1C      6.5  Glutamic Acid Decarb Ab     <5 IU/mL 47 (H)  Pancreatic Islet Cell Antibody     <5 JDF Units <5  C-Peptide     0.80 - 3.85 ng/mL 0.37 (L)  Glucose, Fasting     65 - 99 mg/dL 104 (H)   Elevated GAD antibodies and decreased insulin production. 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 HL  3.  Obesity class I BMI Classification: < 18.5 underweight  18.5-24.9 normal weight   25.0-29.9 overweight  30.0-34.9 class I obesity  35.0-39.9 class II obesity  ? 40.0 class III obesity   PLAN:  1. Patient with longstanding, uncontrolled, insulin-dependent diabetes (LADA) with worsening control in the last year.  Latest HbA1c was higher, at 8.0%, increased from 7.5%.  -Of note, we tried to switch from Humalog to Lyumjev, but he could not tolerate this due to burning at the injection site. -At last visit, sugars were much worse than before.  They were higher throughout the night, mostly in the 200s range, they were dropping after breakfast and increasing significantly after lunch and especially after dinner.  Because his blood sugars were so high, he had to bolus for correction at bedtime, after which the sugars were dropping slightly, but still not to the normal range.  He was not sure why the sugars changed this much.  I advised him to increase the dose of [place] and also the insulin with lunch and dinner and to continue to increase the doses if the sugars did not improve.  Since sugars were increasing before eating, even before going into the shower, I advised him to take his insulin actually before showering.  He usually has a small breakfast, oatmeal. CGM interpretation: -At today's visit, we reviewed his CGM downloads: It appears that 73% of values are in target range (goal >70%), while 20% are higher than 180 (goal <25%), and 7% are lower than  70 (goal <4%).  The calculated average blood sugar is 141.  The projected HbA1c for the next 3 months ([facility]) is 6.7%. -Reviewing the CGM trends, it appears that his sugars are fairly stable overnight, but with occasional sugars at waking up.  He also may have low blood sugars before lunch, in the upper 40s or 50s.  I do not feel these are related to his Humalog doses.  I advised him to decrease Tresiba dose.  His dose of Humalog before breakfast appears to be adequate, as well as the 1 before lunch.  Sugars after dinner increase but this is  usually related to him taking the insulin after the meal, especially if he eats out.  Therefore, I advised him to keep the same doses of Humalog for now but discussed about the importance of taking the Humalog with him and injecting it before dinner.  In fact, I also advised him to look into the CeQur simplicity patch pump (showed him how this works) to allow him to carry his insulin with him wherever he goes.  He seems very interested in this and will check with his insurance whether this is covered.  I advised him to let me know. - I advised him to:  Patient Instructions  Please continue: - Metformin ER 500 mg in am and 500 mg at night - Trulicity 3 mg weekly  Try to decrease: - Tresiba 42-44 units daily - Humalog - 15 min before meals: 5-6 units before b'fast (before shower) 10-15 units before lunch 12-18 units before dinner Sliding scale: - 141-160: + 2 units  - 161-180: + 3 units  - 181-200: + 4 units  - 201-240: + 5 units  - 241-280: + 6 units  - 281-320: + 7 units  - 321-360: + 8 units  - >361: + 10 units   Look up the CeQur pump.  Please return in 3-4 months.    - we checked his HbA1c: 7.3% (lower) - advised to check sugars at different times of the day - 4x a day, rotating check times - advised for yearly eye exams >> he is UTD - return to clinic in 3-4 months   2. HL -Reviewed latest lipid panel from 04/2021: LDL at goal, triglycerides high: Lab Results  Component Value Date   CHOL 136 04/18/2021   HDL 46 04/18/2021   LDLCALC 63 04/18/2021   TRIG 203 (H) 04/18/2021   CHOLHDL 3.0 04/18/2021  -Continues Lipitor 40 mg daily side effects  3.  Obesity class I -We will continue Trulicity which should also help with weight loss -Weight was stable at last visit but gained 8 pounds since then -Continuing to improve his diet will greatly help-discussed about cutting out M&Ms completely   [name], MD PhD [facility] Endocrinology

## 2021-04-19 NOTE — Patient Instructions (Addendum)
Please continue: - Metformin ER 500 mg in am and 500 mg at night - Trulicity 3 mg weekly  Try to decrease: - Tresiba 42-44 units daily - Humalog - 15 min before meals: 5-6 units before b'fast (before shower) 10-15 units before lunch 12-18 units before dinner Sliding scale: - 141-160: + 2 units  - 161-180: + 3 units  - 181-200: + 4 units  - 201-240: + 5 units  - 241-280: + 6 units  - 281-320: + 7 units  - 321-360: + 8 units  - >361: + 10 units   Look up the CeQur pump.  Please return in 3-4 months.

## 2021-04-21 ENCOUNTER — Encounter: Payer: Self-pay | Admitting: Internal Medicine

## 2021-04-21 DIAGNOSIS — E139 Other specified diabetes mellitus without complications: Secondary | ICD-10-CM

## 2021-04-22 MED ORDER — TRESIBA FLEXTOUCH 200 UNIT/ML ~~LOC~~ SOPN
PEN_INJECTOR | SUBCUTANEOUS | 0 refills | Status: DC
Start: 2021-04-22 — End: 2021-08-05

## 2021-04-23 ENCOUNTER — Other Ambulatory Visit: Payer: Self-pay | Admitting: Internal Medicine

## 2021-04-26 ENCOUNTER — Encounter: Payer: Self-pay | Admitting: Internal Medicine

## 2021-04-26 DIAGNOSIS — E139 Other specified diabetes mellitus without complications: Secondary | ICD-10-CM

## 2021-04-26 MED ORDER — TRULICITY 3 MG/0.5ML ~~LOC~~ SOAJ: SUBCUTANEOUS | 1 refills | Status: DC

## 2021-05-09 ENCOUNTER — Telehealth: Payer: Self-pay | Admitting: Adult Health

## 2021-05-09 ENCOUNTER — Other Ambulatory Visit: Payer: Self-pay | Admitting: Adult Health

## 2021-05-09 DIAGNOSIS — I10 Essential (primary) hypertension: Secondary | ICD-10-CM

## 2021-05-09 MED ORDER — OLMESARTAN MEDOXOMIL-HCTZ 40-25 MG PO TABS: 1.0000 | ORAL_TABLET | Freq: Every day | ORAL | 3 refills | Status: DC

## 2021-05-09 NOTE — Telephone Encounter (Signed)
Pt said that since [place] changed his BP med a week ago his BP has been running high. Today it was 712 systolic and I couldn't remember the bottom number. Wanting advice for what he should do.  ?

## 2021-05-10 NOTE — Telephone Encounter (Signed)
Patient informed. ?OK to take 1/2 tablet of the Bisoprolol until new prescription comes in per provider. ?

## 2021-05-14 ENCOUNTER — Encounter: Payer: Self-pay | Admitting: Adult Health

## 2021-05-14 ENCOUNTER — Other Ambulatory Visit: Payer: Self-pay | Admitting: Adult Health

## 2021-05-14 DIAGNOSIS — R053 Chronic cough: Secondary | ICD-10-CM

## 2021-05-16 ENCOUNTER — Other Ambulatory Visit: Payer: Self-pay

## 2021-05-16 ENCOUNTER — Ambulatory Visit
Admission: RE | Admit: 2021-05-16 | Discharge: 2021-05-16 | Disposition: A | Discharge status: Home / Self Care | Payer: 59 | Source: Ambulatory Visit | Attending: Adult Health | Admitting: Adult Health

## 2021-05-23 ENCOUNTER — Other Ambulatory Visit: Payer: Self-pay | Admitting: Adult Health

## 2021-05-30 ENCOUNTER — Ambulatory Visit (INDEPENDENT_AMBULATORY_CARE_PROVIDER_SITE_OTHER): Payer: 59 | Admitting: Adult Health

## 2021-05-30 ENCOUNTER — Encounter: Payer: Self-pay | Admitting: Adult Health

## 2021-05-30 VITALS — BP 122/78 | HR 87 | Temp 97.5°F | Wt 257.0 lb

## 2021-05-30 DIAGNOSIS — Z79899 Other long term (current) drug therapy: Secondary | ICD-10-CM

## 2021-05-30 DIAGNOSIS — I10 Essential (primary) hypertension: Secondary | ICD-10-CM | POA: Diagnosis not present

## 2021-05-30 NOTE — Progress Notes (Signed)
Assessment and Plan: ? ?Harry [name] was seen today for follow-up. ? ?Diagnoses and all orders for this visit: ? ?Essential hypertension ?Well controlled; continue olmesartan/HCTZ ?Monitor blood pressure at home; call if consistently over 130/80 ?Continue DASH diet.   ?Reminder to go to the ER if any CP, SOB, nausea, dizziness, severe HA, changes vision/speech, left arm numbness and tingling and jaw pains ?-     BASIC METABOLIC PANEL WITH GFR ? ?Further disposition pending results of labs. Discussed med's effects and SE's.   ?Over 15 minutes of exam, counseling, chart review, and critical decision making was performed.  ? ?Future Appointments  ?Date Time Provider [place]  ?09/24/2021  4:00 PM [name], MD [id number] None  ?10/08/2021 10:00 AM Liane Comber, NP [id number] None  ? ? ?------------------------------------------------------------------------------------------------------------------ ? ? ?HPI ?BP 122/78   Pulse 87   Temp (!) 97.5 ?F (36.4 ?C)   Wt 257 lb (116.6 kg)   SpO2 99%   BMI 33.45 kg/m?  ?66 y.o.male with LADA with CKD 2 and htn presents for 4 week follow up on htn after med change.  ? ?He was previously on ziac and losartan/hctz, wished to get off of BB and reduce pill burden, switched to olmesartan/HCTZ 40/25 mg.  ? ? ?His blood pressure has been controlled at home, today their BP is BP: 122/78 ? He does workout. He denies chest pain, shortness of breath, dizziness. ? ?Lab Results  ?Component Value Date  ? EGFR 82 04/18/2021  ? ? ? ?Past Medical History:  ?Diagnosis Date  ? Diabetes mellitus without complication ([place]) 9449  ? insulin requiring  ? Elevated PSA 09/22/2018  ? [facility] is following   ? GERD (gastroesophageal reflux disease)   ? Hyperlipidemia   ? Hypertension   ? Hypokalemia   ? Hypomagnesemia   ? OSA on CPAP   ? 10-12 years ago  ? Vitamin D deficiency   ?  ? ?Allergies  ?Allergen Reactions  ? Crestor [Rosuvastatin] Cough  ? ? ?Current Outpatient Medications  on File Prior to Visit  ?Medication Sig  ? ALPRAZolam (XANAX) 1 MG tablet Take     1/2 to 1 tablet        2 to 3 x /day          as needed for Anxiety attack  ? aspirin 81 MG tablet Take 81 mg by mouth daily.  ? atorvastatin (LIPITOR) 40 MG tablet TAKE 1 TABLET DAILY FOR CHOLESTEROL  ? BD INSULIN SYRINGE U/F 31G X 5/16" 0.5 ML MISC INJECT INSULIN TWICE A DAY AS DIRECTED  ? Cholecalciferol (VITAMIN D) 125 MCG (5000 UT) CAPS Take 10,000 Units by mouth daily.  ? Continuous Blood Gluc Sensor (FREESTYLE LIBRE 14 DAY SENSOR) MISC USE FOR CONTINUOUS BLOOD GLUCOSE MONITORING  ? Cyanocobalamin (B-12) 2500 MCG SUBL Place under the tongue daily.  ? Dulaglutide (TRULICITY) 3 QP/5.9FM SOPN INJECT 3 MG UNDER THE SKIN ONCE A WEEK  ? finasteride (PROSCAR) 5 MG tablet Take 5 mg by mouth daily.  ? glucose 4 GM chewable tablet Chew 1 tablet (4 g total) by mouth as needed for low blood sugar.  ? glucose blood (FREESTYLE TEST STRIPS) test strip Use 1- 2 times a day with the freestyle libre 14.  CGM glucometer  ? HUMALOG 100 UNIT/ML injection Inject 20 Units into the skin 3 (three) times daily with meals. Sliding Scale  ? Magnesium 500 MG TABS Take 1 tablet by mouth daily.  ? metFORMIN (GLUCOPHAGE-XR) 500 MG 24  hr tablet TAKE 2 TABLETS TWICE A DAY WITH MEALS FOR DIABETES  ? olmesartan-hydrochlorothiazide (BENICAR HCT) 40-25 MG tablet Take 1 tablet by mouth daily.  ? omeprazole (PRILOSEC) 20 MG capsule TAKE 1 CAPSULE DAILY FOR INDIGESTION AND HEARTBURN  ? potassium chloride SA (KLOR-CON M20) 20 MEQ tablet TAKE 1 TABLET TWICE A DAY FOR POTASSIUM  ? sildenafil (VIAGRA) 25 MG tablet Take 25 mg by mouth daily as needed for erectile dysfunction.  ? tamsulosin (FLOMAX) 0.4 MG CAPS capsule Take 0.4 mg by mouth daily.  ? TRESIBA FLEXTOUCH 200 UNIT/ML FlexTouch Pen Inject up to 48 units into the skin daily  ? ?No current facility-administered medications on file prior to visit.  ? ? ?ROS: all negative except above.  ? ?Physical Exam: ? ?BP 122/78    Pulse 87   Temp (!) 97.5 ?F (36.4 ?C)   Wt 257 lb (116.6 kg)   SpO2 99%   BMI 33.45 kg/m?  ? ?General Appearance: Well nourished, in no apparent distress. ?Eyes: PERRLA, conjunctiva no swelling or erythema ?ENT/Mouth: mask in place; Hearing normal.  ?Neck: Supple, thyroid normal.  ?Respiratory: Respiratory effort normal, BS equal bilaterally without rales, rhonchi, wheezing or stridor.  ?Cardio: RRR with no MRGs. Brisk peripheral pulses without edema.  ?Abdomen: Soft, + BS.  Non tender, no guarding, rebound, hernias, masses. ?Lymphatics: Non tender without lymphadenopathy.  ?Musculoskeletal: no obvious deformity; normal gait.  ?Skin: Warm, dry without rashes, lesions, ecchymosis.  ?Neuro: Normal muscle tone ?Psych: Awake and oriented X 3, normal affect, Insight and Judgment appropriate.  ? ?  ? ?Izora Ribas, NP ?4:42 PM ?[facility] ? ?

## 2021-05-31 LAB — BASIC METABOLIC PANEL WITH GFR
BUN: 14 mg/dL (ref 7–25)
CO2: 29 mmol/L (ref 20–32)
Calcium: 9.3 mg/dL (ref 8.6–10.3)
Chloride: 104 mmol/L (ref 98–110)
Creat: 0.96 mg/dL (ref 0.70–1.35)
Glucose, Bld: 115 mg/dL — ABNORMAL HIGH (ref 65–99)
Potassium: 3.5 mmol/L (ref 3.5–5.3)
Sodium: 142 mmol/L (ref 135–146)
eGFR: 87 mL/min/{1.73_m2} (ref >=60)

## 2021-06-05 ENCOUNTER — Telehealth: Payer: Self-pay

## 2021-06-05 NOTE — Telephone Encounter (Signed)
Pt requested a [facility]ill be [facility] into [facility].  ?Rx called in for syringe needles. ?

## 2021-07-04 ENCOUNTER — Other Ambulatory Visit: Payer: Self-pay | Admitting: Internal Medicine

## 2021-07-04 ENCOUNTER — Encounter: Payer: Self-pay | Admitting: Internal Medicine

## 2021-07-04 DIAGNOSIS — E139 Other specified diabetes mellitus without complications: Secondary | ICD-10-CM

## 2021-07-04 MED ORDER — FREESTYLE LIBRE 14 DAY SENSOR MISC: 3 refills | Status: DC

## 2021-07-04 MED ORDER — FREESTYLE LIBRE 3 SENSOR MISC: 3 refills | Status: DC

## 2021-07-23 LAB — HM COLONOSCOPY

## 2021-08-05 ENCOUNTER — Other Ambulatory Visit: Payer: Self-pay

## 2021-08-05 DIAGNOSIS — E139 Other specified diabetes mellitus without complications: Secondary | ICD-10-CM

## 2021-08-05 MED ORDER — TRESIBA FLEXTOUCH 200 UNIT/ML ~~LOC~~ SOPN: 48.0000 [IU] | PEN_INJECTOR | Freq: Every day | SUBCUTANEOUS | 2 refills | Status: DC

## 2021-08-05 MED ORDER — TRULICITY 3 MG/0.5ML ~~LOC~~ SOAJ: SUBCUTANEOUS | 2 refills | Status: DC

## 2021-08-07 ENCOUNTER — Other Ambulatory Visit (HOSPITAL_COMMUNITY): Payer: Self-pay

## 2021-08-07 ENCOUNTER — Telehealth: Payer: Self-pay

## 2021-08-07 NOTE — Telephone Encounter (Signed)
Patient Advocate Encounter  Prior Authorization for Trulicity 3 PQ/9.8YM  has been approved.    Case ID: [id number] Effective dates: 07/08/2021 through 08/07/2022

## 2021-08-07 NOTE — Telephone Encounter (Signed)
Patient Advocate Encounter   Received notification that prior authorization for Trulicity 3 MZ/5.8EW is required.   PA submitted on 08/07/2021 via Fax  Barcode:[phone]  Case [id number] Status is pending

## 2021-09-11 ENCOUNTER — Other Ambulatory Visit: Payer: Self-pay | Admitting: Internal Medicine

## 2021-09-13 ENCOUNTER — Ambulatory Visit: Payer: 59 | Admitting: Internal Medicine

## 2021-09-24 ENCOUNTER — Ambulatory Visit: Payer: 59 | Admitting: Internal Medicine

## 2021-09-24 NOTE — Progress Notes (Deleted)
Patient ID: Harry [name], male   DOB: April 02, 1955, 66 y.o.   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 Last visit 4 months ago.  Interim history: No increased urination, blurry vision, nausea, chest pain. Before last visit, he started to change his diet, reducing sweets and snacks.  Reviewed HbA1c levels: Lab Results  Component Value Date   HGBA1C 7.3 (A) 04/19/2021   HGBA1C 8.0 (A) 12/13/2020   HGBA1C 7.5 (A) 08/13/2020   He is on: - Metformin ER 1000 >> 500 mg in am and 500 mg at night -decreased 2/2 diarrhea - Trulicity 1.5 >> 3 mg weekly - Tresiba 44 >> 48 >> 42-44 units at bedtime - Humalog (15 minutes before meals)  6-8 units before b'fast >> 5-6 before shower 10-12 >> 10-14 >> 10-15 units before lunch (sandwich) 10-12 >> 10-14 >> 12-18 units before dinner Correction at bedtime:4-5 units Sliding scale: - 141-160: + 2 units  - 161-180: + 3 units  - 181-200: + 4 units  - 201-240: + 5 units  - 241-280: + 6 units  - 281-320: + 7 units  - 321-360: + 8 units  - >361: + 10 units He had burning at the inj site with Lyumjev. Previously on Lantus.  He checks his sugars more than 4 times a day with his freestyle libre CGM:  Previously:   Previously:   Lowest sugar was 30s ... >> 50 >> 48; he has hypoglycemia awareness in the 60s. Highest sugar was in the HI >> 300s.  Glucometer: one touch ultra 2  Pt's meals are: - Breakfast: Glucerna, banana >> oatmeal >> English muffin or boiled egg or oatmeal - Lunch: eats out - salad with chicken; chicken burrito; sandwich; chick-fil-a >> sandwich with either tomatoes or peanut butter and jelly + hold milk - Dinner: meat + veggie + starch - Snacks: potato chips, fruit, icecream No sodas.  -+ Mild CKD, last BUN/creatinine:  Lab Results  Component  Value Date   BUN 14 05/30/2021   BUN 14 04/18/2021   CREATININE 0.96 05/30/2021   CREATININE 1.00 04/18/2021  On losartan.  -+ HL; last set of lipids: Lab Results  Component Value Date   CHOL 136 04/18/2021   HDL 46 04/18/2021   LDLCALC 63 04/18/2021   TRIG 203 (H) 04/18/2021   CHOLHDL 3.0 04/18/2021  On Lipitor 40.  - last eye exam was in 11/2020: No DR  -No numbness and tingling in his feet.  Foot exam 10/05/2020.  He also has a history of HTN, GERD, OSA-on CPAP.  Of note, his wife passed away with cancer in May 12, 2018.  ROS: + See HPI  I reviewed pt's medications, allergies, PMH, social hx, family hx, and changes were documented in the history of present illness. Otherwise, unchanged from my initial visit note.  Past Medical History:  Diagnosis Date   Diabetes mellitus without complication ([place]) 6568   insulin requiring   Elevated PSA 09/22/2018   [facility] is following    GERD (gastroesophageal reflux disease)    Hyperlipidemia    Hypertension    Hypokalemia    Hypomagnesemia    OSA on CPAP    10-12 years ago   Vitamin D deficiency    Past Surgical History:  Procedure Laterality Date   ANAL FISSURE REPAIR  2007   TONSILLECTOMY Bilateral    As a child   VENTRAL HERNIA REPAIR  04/13/12   Dr. Johney Maine, ventral hernia repair w/ mesh   Social History   Social History   Marital status: Married    Spouse name: N/A   Number of children: 2   Occupational History   estimator   Social History Main Topics   Smoking status: Never Smoker   Smokeless tobacco: Not on file   Alcohol use 0.0 oz/week    2 - 5 Cans of beer per week   Current Outpatient Medications on File Prior to Visit  Medication Sig Dispense Refill   ALPRAZolam (XANAX) 1 MG tablet Take     1/2 to 1 tablet        2 to 3 x /day          as needed for Anxiety attack 30 tablet 0   aspirin 81 MG tablet Take 81 mg by mouth daily.     atorvastatin (LIPITOR) 40 MG tablet TAKE 1 TABLET DAILY FOR  CHOLESTEROL 90 tablet 3   BD INSULIN SYRINGE U/F 31G X 5/16" 0.5 ML MISC INJECT INSULIN TWICE A DAY AS DIRECTED 200 each 3   Cholecalciferol (VITAMIN D) 125 MCG (5000 UT) CAPS Take 10,000 Units by mouth daily. 60 capsule 0   Cyanocobalamin (B-12) 2500 MCG SUBL Place under the tongue daily.     Dulaglutide (TRULICITY) 3 IP/3.8SN SOPN INJECT 3 MG UNDER THE SKIN ONCE A WEEK 6 mL 2   finasteride (PROSCAR) 5 MG tablet Take 5 mg by mouth daily.     glucose 4 GM chewable tablet Chew 1 tablet (4 g total) by mouth as needed for low blood sugar. 30 tablet 12   glucose blood (FREESTYLE TEST STRIPS) test strip Use 1- 2 times a day with the freestyle libre 14.  CGM glucometer 150 each 3   HUMALOG 100 UNIT/ML injection INJECT 10 UNITS TO 12 UNITS BEFORE BREAKFAST AND 15 UNITS TO 20 UNITS BEFORE LUNCH AND DINNER 210 mL 0   Magnesium 500 MG TABS Take 1 tablet by mouth daily.     metFORMIN (GLUCOPHAGE-XR) 500 MG 24 hr tablet TAKE 2 TABLETS TWICE A DAY WITH MEALS FOR DIABETES 360 tablet 3   olmesartan-hydrochlorothiazide (BENICAR HCT) 40-25 MG tablet Take 1 tablet by mouth daily. 90 tablet 3   omeprazole (PRILOSEC) 20 MG capsule TAKE 1 CAPSULE DAILY FOR INDIGESTION AND HEARTBURN 90 capsule 3   potassium chloride SA (KLOR-CON M20) 20 MEQ tablet TAKE 1 TABLET TWICE A DAY FOR POTASSIUM 180 tablet 3   sildenafil (VIAGRA) 25 MG tablet Take 25 mg by mouth daily as needed for erectile dysfunction.     tamsulosin (FLOMAX) 0.4 MG CAPS capsule Take 0.4 mg by mouth daily.     TRESIBA FLEXTOUCH 200 UNIT/ML FlexTouch Pen Inject 48 Units into the skin daily. 27 mL 2   No current facility-administered medications on file prior to visit.   Allergies  Allergen Reactions   Crestor [Rosuvastatin] Cough   Family History  Problem Relation Age of Onset   Atrial fibrillation Mother    Kidney cancer Father 57       kidney   Stroke Maternal Grandmother    Dementia Paternal Grandmother        Early 27s   Cancer Paternal  Grandfather        Unsure of age or type   Cancer Paternal Uncle 26    PE: There were no  vitals taken for this visit. Wt Readings from Last 3 Encounters:  05/30/21 257 lb (116.6 kg)  04/19/21 256 lb 3.2 oz (116.2 kg)  04/18/21 258 lb 3.2 oz (117.1 kg)   Constitutional: overweight, in NAD Eyes: no exophthalmos ENT: moist mucous membranes, no masses palpated in neck, no cervical lymphadenopathy Cardiovascular: RRR, No MRG Respiratory: CTA B Musculoskeletal: no deformities Skin: moist, warm, no rashes Neurological: no tremor with outstretched hands  ASSESSMENT: 1. LADA, insulin-dependent, uncontrolled, with complications - CKD stage 2 - ED  - he is interested in an insulin pump  - he has an elliptical machine at home  Component     Latest Ref Rng & Units 07/11/2016  Hemoglobin A1C      6.5  Glutamic Acid Decarb Ab     <5 IU/mL 47 (H)  Pancreatic Islet Cell Antibody     <5 JDF Units <5  C-Peptide     0.80 - 3.85 ng/mL 0.37 (L)  Glucose, Fasting     65 - 99 mg/dL 104 (H)   Elevated GAD antibodies and decreased insulin production. 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 HL  3.  Obesity class I BMI Classification: < 18.5 underweight  18.5-24.9 normal weight  25.0-29.9 overweight  30.0-34.9 class I obesity  35.0-39.9 class II obesity  ? 40.0 class III obesity   PLAN:  1. Patient with   longstanding, uncontrolled, insulin-dependent diabetes (LADA) with worsening control in the last year.  Latest HbA1c was higher, at 8.0%, increased from 7.5%.  -Of note, we tried to switch from Humalog to Lyumjev, but he could not tolerate this due to burning at the injection site. -At last visit, sugars were much worse than before.  They were higher throughout the night, mostly in the 200s range, they were dropping after breakfast and increasing significantly after lunch and especially after  dinner.  Because his blood sugars were so high, he had to bolus for correction at bedtime, after which the sugars were dropping slightly, but still not to the normal range.  He was not sure why the sugars changed this much.  I advised him to increase the dose of [place] and also the insulin with lunch and dinner and to continue to increase the doses if the sugars did not improve.  Since sugars were increasing before eating, even before going into the shower, I advised him to take his insulin actually before showering.  He usually has a small breakfast, oatmeal. CGM interpretation: -At today's visit, we reviewed his CGM downloads: It appears that *** of values are in target range (goal >70%), while *** are higher than 180 (goal <25%), and *** are lower than 70 (goal <4%).  The calculated average blood sugar is ***.  The projected HbA1c for the next 3 months (GMI) is ***. -Reviewing the CGM trends, ***  -Reviewing the CGM trends, it appears that his sugars are fairly stable overnight, but with occasional sugars at waking up.  He also may have low blood sugars before lunch, in the upper 40s or 50s.  I do not feel these are related to his Humalog doses.  I advised him to decrease Tresiba dose.  His dose of Humalog before breakfast appears to be adequate, as well as the 1 before lunch.  Sugars after dinner increase but this is usually related to him taking the insulin after the meal, especially if  he eats out.  Therefore, I advised him to keep the same doses of Humalog for now but discussed about the importance of taking the Humalog with him and injecting it before dinner.  In fact, I also advised him to look into the CeQur simplicity patch pump (showed him how this works) to allow him to carry his insulin with him wherever he goes.  He seems very interested in this and will check with his insurance whether this is covered.  I advised him to let me know. - I advised him to:  Patient Instructions  Please  continue: - Metformin ER 500 mg in am and 500 mg at night - Trulicity 3 mg weekly - Tresiba 42-44 units daily - Humalog - 15 min before meals: 5-6 units before b'fast (before shower) 10-15 units before lunch 12-18 units before dinner Sliding scale of Humalog: - 141-160: + 2 units  - 161-180: + 3 units  - 181-200: + 4 units  - 201-240: + 5 units  - 241-280: + 6 units  - 281-320: + 7 units  - 321-360: + 8 units  - >361: + 10 units   Look up the CeQur pump.  Please return in 3-4 months.    - we checked his HbA1c: 7%  - advised to check sugars at different times of the day - 4x a day, rotating check times - advised for yearly eye exams >> he is UTD - return to clinic in 3-4 months   2. HL -Latest lipid panel from 04/2021 was reviewed: LDL was at goal, but triglycerides elevated: Lab Results  Component Value Date   CHOL 136 04/18/2021   HDL 46 04/18/2021   LDLCALC 63 04/18/2021   TRIG 203 (H) 04/18/2021   CHOLHDL 3.0 04/18/2021  -He continues on Lipitor 40 mg daily without side effects  3.  Obesity class I -We will continue Trulicity which should also help with weight loss -He gained 8 pounds before last visit -At last visit I advised him to cut out M&Ms completely   [name], MD PhD [facility]

## 2021-10-08 ENCOUNTER — Encounter: Payer: 59 | Admitting: Nurse Practitioner

## 2021-10-08 DIAGNOSIS — Z0000 Encounter for general adult medical examination without abnormal findings: Secondary | ICD-10-CM

## 2021-10-18 ENCOUNTER — Ambulatory Visit (INDEPENDENT_AMBULATORY_CARE_PROVIDER_SITE_OTHER): Payer: 59 | Admitting: Internal Medicine

## 2021-10-18 ENCOUNTER — Encounter: Payer: Self-pay | Admitting: Internal Medicine

## 2021-10-18 VITALS — BP 144/100 | HR 69 | Ht 73.5 in | Wt 255.4 lb

## 2021-10-18 DIAGNOSIS — E669 Obesity, unspecified: Secondary | ICD-10-CM

## 2021-10-18 DIAGNOSIS — E1322 Other specified diabetes mellitus with diabetic chronic kidney disease: Secondary | ICD-10-CM

## 2021-10-18 DIAGNOSIS — E139 Other specified diabetes mellitus without complications: Secondary | ICD-10-CM

## 2021-10-18 DIAGNOSIS — E1365 Other specified diabetes mellitus with hyperglycemia: Secondary | ICD-10-CM

## 2021-10-18 DIAGNOSIS — E1369 Other specified diabetes mellitus with other specified complication: Secondary | ICD-10-CM

## 2021-10-18 DIAGNOSIS — E785 Hyperlipidemia, unspecified: Secondary | ICD-10-CM

## 2021-10-18 DIAGNOSIS — E1169 Type 2 diabetes mellitus with other specified complication: Secondary | ICD-10-CM

## 2021-10-18 DIAGNOSIS — N182 Chronic kidney disease, stage 2 (mild): Secondary | ICD-10-CM

## 2021-10-18 LAB — POCT GLYCOSYLATED HEMOGLOBIN (HGB A1C): Hemoglobin A1C: 7.7 % — AB (ref 4.0–5.6)

## 2021-10-18 MED ORDER — CEQUR SIMPLICITY INSERTER MISC: 0 refills | Status: DC

## 2021-10-18 MED ORDER — CEQUR SIMPLICITY 2U DEVI: 1.0000 | 11 refills | Status: DC

## 2021-10-18 MED ORDER — TIRZEPATIDE 5 MG/0.5ML ~~LOC~~ SOAJ: 5.0000 mg | SUBCUTANEOUS | 3 refills | Status: DC

## 2021-10-18 NOTE — Progress Notes (Signed)
Patient ID: Harry [name], male   DOB: June 27, 1955, 66 y.o.   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 Last visit 6 months ago. 10/01/2021: changed Insurance from Harry [name] to Harry [name].  Interim history: No increased urination, blurry vision, nausea, chest pain. Before last visit, he started to change his diet, reducing sweets and snacks.  However, he is not meeting them again and also relaxed his diet when he was traveling recently.  Reviewed HbA1c levels: Lab Results  Component Value Date   HGBA1C 7.3 (A) 04/19/2021   HGBA1C 8.0 (A) 12/13/2020   HGBA1C 7.5 (A) 08/13/2020   He is on: - Metformin ER 1000 >> 500 mg in am and 500 mg at night -decreased 2/2 diarrhea - Trulicity 1.5 >> 3 mg weekly - Tresiba 44 >> 48 >> 42-44 >> 46 units at bedtime - Humalog (15 minutes before meals)  6-8 units before b'fast >> 5-6 units before shower >> 6-12 units 10-12 >> 10-14 >> 10-15 >> 10-12 units before lunch (sandwich)  10-12 >> 10-14 >> 12-18 >> 12-15 units before dinner Correction at bedtime:4-5 units Sliding scale: - 141-160: + 2 units  - 161-180: + 3 units  - 181-200: + 4 units  - 201-240: + 5 units  - 241-280: + 6 units  - 281-320: + 7 units  - 321-360: + 8 units  - >361: + 10 units He had burning at the inj site with Lyumjev. Previously on Lantus.  He checks his sugars more than 4 times a day with his freestyle libre CGM:   Previously:   Previously:   Lowest sugar was 30s ... >> 50 >> 48; he has hypoglycemia awareness in the 60s. Highest sugar was in the HI >> 300s.  Glucometer: one touch ultra 2  Pt's meals are: - Breakfast: Glucerna, banana >> oatmeal >> English muffin or boiled egg or oatmeal - Lunch: eats out - salad with chicken; chicken burrito; sandwich; chick-fil-a >> sandwich with either  tomatoes or peanut butter and jelly + hold milk - Dinner: meat + veggie + starch - Snacks: potato chips, fruit, icecream No sodas.  -+ Mild CKD, last BUN/creatinine:  Lab Results  Component Value Date   BUN 14 05/30/2021   BUN 14 04/18/2021   CREATININE 0.96 05/30/2021   CREATININE 1.00 04/18/2021  On losartan.  -+ HL; last set of lipids: Lab Results  Component Value Date   CHOL 136 04/18/2021   HDL 46 04/18/2021   LDLCALC 63 04/18/2021   TRIG 203 (H) 04/18/2021   CHOLHDL 3.0 04/18/2021  On Lipitor 40.  - last eye exam was in 11/2020: No DR  -No numbness and tingling in his feet.  Foot exam 10/05/2020.  He also has a history of HTN, GERD, OSA-on CPAP.  Of note, his wife passed away with cancer in 05/05/18.  ROS: + See HPI  I reviewed pt's medications, allergies, PMH, social hx, family hx, and changes were documented in the history of present illness. Otherwise, unchanged from my initial visit note.  Past Medical History:  Diagnosis Date   Diabetes mellitus without complication ([place]) 7408   insulin requiring   Elevated PSA 09/22/2018   [facility] is following    GERD (gastroesophageal reflux disease)    Hyperlipidemia    Hypertension    Hypokalemia  Hypomagnesemia    OSA on CPAP    10-12 years ago   Vitamin D deficiency    Past Surgical History:  Procedure Laterality Date   ANAL FISSURE REPAIR  2007   TONSILLECTOMY Bilateral    As a child   VENTRAL HERNIA REPAIR  04/13/12   Dr. Johney Maine, ventral hernia repair w/ mesh   Social History   Social History   Marital status: Married    Spouse name: N/A   Number of children: 2   Occupational History   estimator   Social History Main Topics   Smoking status: Never Smoker   Smokeless tobacco: Not on file   Alcohol use 0.0 oz/week    2 - 5 Cans of beer per week   Current Outpatient Medications on File Prior to Visit  Medication Sig Dispense Refill   ALPRAZolam (XANAX) 1 MG tablet Take     1/2 to 1  tablet        2 to 3 x /day          as needed for Anxiety attack 30 tablet 0   aspirin 81 MG tablet Take 81 mg by mouth daily.     atorvastatin (LIPITOR) 40 MG tablet TAKE 1 TABLET DAILY FOR CHOLESTEROL 90 tablet 3   BD INSULIN SYRINGE U/F 31G X 5/16" 0.5 ML MISC INJECT INSULIN TWICE A DAY AS DIRECTED 200 each 3   Cholecalciferol (VITAMIN D) 125 MCG (5000 UT) CAPS Take 10,000 Units by mouth daily. 60 capsule 0   Cyanocobalamin (B-12) 2500 MCG SUBL Place under the tongue daily.     Dulaglutide (TRULICITY) 3 SW/5.4OE SOPN INJECT 3 MG UNDER THE SKIN ONCE A WEEK 6 mL 2   finasteride (PROSCAR) 5 MG tablet Take 5 mg by mouth daily.     glucose 4 GM chewable tablet Chew 1 tablet (4 g total) by mouth as needed for low blood sugar. 30 tablet 12   glucose blood (FREESTYLE TEST STRIPS) test strip Use 1- 2 times a day with the freestyle libre 14.  CGM glucometer 150 each 3   HUMALOG 100 UNIT/ML injection INJECT 10 UNITS TO 12 UNITS BEFORE BREAKFAST AND 15 UNITS TO 20 UNITS BEFORE LUNCH AND DINNER 210 mL 0   Magnesium 500 MG TABS Take 1 tablet by mouth daily.     metFORMIN (GLUCOPHAGE-XR) 500 MG 24 hr tablet TAKE 2 TABLETS TWICE A DAY WITH MEALS FOR DIABETES 360 tablet 3   olmesartan-hydrochlorothiazide (BENICAR HCT) 40-25 MG tablet Take 1 tablet by mouth daily. 90 tablet 3   omeprazole (PRILOSEC) 20 MG capsule TAKE 1 CAPSULE DAILY FOR INDIGESTION AND HEARTBURN 90 capsule 3   potassium chloride SA (KLOR-CON M20) 20 MEQ tablet TAKE 1 TABLET TWICE A DAY FOR POTASSIUM 180 tablet 3   sildenafil (VIAGRA) 25 MG tablet Take 25 mg by mouth daily as needed for erectile dysfunction.     tamsulosin (FLOMAX) 0.4 MG CAPS capsule Take 0.4 mg by mouth daily.     TRESIBA FLEXTOUCH 200 UNIT/ML FlexTouch Pen Inject 48 Units into the skin daily. 27 mL 2   No current facility-administered medications on file prior to visit.   Allergies  Allergen Reactions   Crestor [Rosuvastatin] Cough   Family History  Problem Relation  Age of Onset   Atrial fibrillation Mother    Kidney cancer Father 68       kidney   Stroke Maternal Grandmother    Dementia Paternal Grandmother  Early 51s   Cancer Paternal Grandfather        Marena [name] of age or type   Cancer Paternal Uncle 81    PE: BP (!) 144/100 (BP Location: Right Arm, Patient Position: Sitting, Cuff Size: Normal)   Pulse 69   Ht 6' 1.5" (1.867 m)   Wt 255 lb 6.4 oz (115.8 kg)   SpO2 98%   BMI 33.24 kg/m  Wt Readings from Last 3 Encounters:  10/18/21 255 lb 6.4 oz (115.8 kg)  05/30/21 257 lb (116.6 kg)  04/19/21 256 lb 3.2 oz (116.2 kg)   Constitutional: overweight, in NAD Eyes: no exophthalmos ENT: moist mucous membranes, no masses palpated in neck, no cervical lymphadenopathy Cardiovascular: RRR, No MRG Respiratory: CTA B Musculoskeletal: no deformities Skin: moist, warm, no rashes Neurological: no tremor with outstretched hands Diabetic Foot Exam - Simple   Simple Foot Form Diabetic Foot exam was performed with the following findings: Yes 10/18/2021 12:04 PM  Visual Inspection No deformities, no ulcerations, no other skin breakdown bilaterally: Yes Sensation Testing Intact to touch and monofilament testing bilaterally: Yes Pulse Check Posterior Tibialis and Dorsalis pulse intact bilaterally: Yes Comments     ASSESSMENT: 1. LADA, insulin-dependent, uncontrolled, with complications - CKD stage 2 - ED  - he is interested in an insulin pump  - he has an elliptical machine at home  Component     Latest Ref Rng & Units 07/11/2016  Hemoglobin A1C      6.5  Glutamic Acid Decarb Ab     <5 IU/mL 47 (H)  Pancreatic Islet Cell Antibody     <5 JDF Units <5  C-Peptide     0.80 - 3.85 ng/mL 0.37 (L)  Glucose, Fasting     65 - 99 mg/dL 104 (H)   Elevated GAD antibodies and decreased insulin production. 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  HL  3.  Obesity class I BMI Classification: < 18.5 underweight  18.5-24.9 normal weight  25.0-29.9 overweight  30.0-34.9 class I obesity  35.0-39.9 class II obesity  ? 40.0 class III obesity   PLAN:  1. Patient with longstanding, uncontrolled, insulin-dependent diabetes (LADA), with worsening control last year, but then with improving control at last visit.  HbA1c at that time was lower, at 7.3%. -At last visit reviewing the CGM trends, it appeared that his sugars were fairly stable overnight, with occasional low blood sugars at waking up and before lunch.  We decreased his Tresiba dose.  His dose of Humalog appeared adequate before breakfast and lunch blood sugars are higher after dinner especially as he was taking the insulin after the meal, while he was eating out.  I advised him to continue the same dose of Humalog but take it 15 minutes before he was eating.  Of note, he tried Lyumjev in the past but could not tolerate it due to burning at the injection site.  At last visit I suggested to look up the [place] simplicity system.  He did and it appears that this is covered for him. CGM interpretation: -At today's visit, we reviewed his CGM downloads: It appears that 50% of values are in target range (goal >70%), while 48% are higher than 180 (goal <25%), and 2% are lower than 70 (goal <4%).  The calculated average blood sugar is 191.  The projected HbA1c for the next 3 months (GMI) is 7.9%. -Reviewing  the CGM trends, sugars are much worse than before - they are high overnight, then he corrects them with 6-12 units (rather than 5 to 6 units as recommended at last visit) and the sugars are plummeting afterwards, reaching a nadir before lunch.  Blood sugars then increase abruptly and significantly after this meal despite him taking insulin for it.  After dinner, sugars are more variable, some of them being much higher, up to 400s and some in the target range. -At this visit, we did discuss about  improving his diet now that he is back on vacation, but I also suggested to decrease his Humalog dose taken in the morning and increase the insulin before lunch and dinner.  In the meantime, I also advised him to try to switch from Trulicity to [facility], for stronger effect.  Started on low-dose we will increase after 1 month, depending on his tolerance.  Discussed about possible side effects. -I called in a prescription for the CeQur insulin pump to his pharmacy and he will have an appointment with the diabetes educator to attach this - I advised him to:  Patient Instructions  Please continue: - Metformin ER 500 mg in am and 500 mg at night - Tresiba 42-44 units daily  Change: - Humalog - 15 min before meals:  5 units in am 12-15 units before lunch 12-18 units before dinner Sliding scale of Humalog: - 141-160: + 2 units  - 161-180: + 3 units  - 181-200: + 4 units  - 201-240: + 5 units  - 241-280: + 6 units  - 281-320: + 7 units  - 321-360: + 8 units  - >361: + 10 units    Try to schedule an appt with Vaughan Basta for the CeQur pump.   Try to change from Trulicity to [facility] 5 mg weekly - let me know in 3 weeks if we can increase the dose.  Please return in 3-4 months.    - we checked his HbA1c: 7.7% (higher) - advised to check sugars at different times of the day - 4x a day, rotating check times - advised for yearly eye exams >> he is UTD - return to clinic in 3-4 months   2. HL -Plan lipid panel from 04/2021 showed an LDL at goal, triglycerides high: Lab Results  Component Value Date   CHOL 136 04/18/2021   HDL 46 04/18/2021   LDLCALC 63 04/18/2021   TRIG 203 (H) 04/18/2021   CHOLHDL 3.0 04/18/2021  -He is on Lipitor 40 mg daily without side effects  3.  Obesity class I -We will continue Trulicity which should also help with weight loss -He gained 8 pounds before last visit -At last visit, I advised him to cut out M&Ms completely >> stopped -lost 2 lbs since last  OV   [name], MD PhD [facility]

## 2021-10-18 NOTE — Patient Instructions (Addendum)
Please continue: - Metformin ER 500 mg in am and 500 mg at night - Tresiba 42-44 units daily  Change: - Humalog - 15 min before meals:  5 units in am 12-15 units before lunch 12-18 units before dinner Sliding scale of Humalog: - 141-160: + 2 units  - 161-180: + 3 units  - 181-200: + 4 units  - 201-240: + 5 units  - 241-280: + 6 units  - 281-320: + 7 units  - 321-360: + 8 units  - >361: + 10 units    Try to schedule an appt with Vaughan Basta for the CeQur pump.   Try to change from Trulicity to [facility] 5 mg weekly - let me know in 3 weeks if we can increase the dose.  Please return in 3-4 months.

## 2021-10-23 ENCOUNTER — Ambulatory Visit (INDEPENDENT_AMBULATORY_CARE_PROVIDER_SITE_OTHER): Payer: 59 | Admitting: Nurse Practitioner

## 2021-10-23 ENCOUNTER — Encounter: Payer: Self-pay | Admitting: Nurse Practitioner

## 2021-10-23 ENCOUNTER — Telehealth: Payer: Self-pay

## 2021-10-23 ENCOUNTER — Other Ambulatory Visit (HOSPITAL_COMMUNITY): Payer: Self-pay

## 2021-10-23 VITALS — BP 140/78 | HR 78 | Temp 97.7°F | Ht 74.0 in | Wt 253.0 lb

## 2021-10-23 DIAGNOSIS — Z79899 Other long term (current) drug therapy: Secondary | ICD-10-CM

## 2021-10-23 DIAGNOSIS — Z1329 Encounter for screening for other suspected endocrine disorder: Secondary | ICD-10-CM

## 2021-10-23 DIAGNOSIS — Z0000 Encounter for general adult medical examination without abnormal findings: Secondary | ICD-10-CM | POA: Diagnosis not present

## 2021-10-23 DIAGNOSIS — E1122 Type 2 diabetes mellitus with diabetic chronic kidney disease: Secondary | ICD-10-CM

## 2021-10-23 DIAGNOSIS — Z0001 Encounter for general adult medical examination with abnormal findings: Secondary | ICD-10-CM

## 2021-10-23 DIAGNOSIS — K439 Ventral hernia without obstruction or gangrene: Secondary | ICD-10-CM

## 2021-10-23 DIAGNOSIS — E559 Vitamin D deficiency, unspecified: Secondary | ICD-10-CM

## 2021-10-23 DIAGNOSIS — E538 Deficiency of other specified B group vitamins: Secondary | ICD-10-CM

## 2021-10-23 DIAGNOSIS — I10 Essential (primary) hypertension: Secondary | ICD-10-CM | POA: Diagnosis not present

## 2021-10-23 DIAGNOSIS — N401 Enlarged prostate with lower urinary tract symptoms: Secondary | ICD-10-CM

## 2021-10-23 DIAGNOSIS — I7121 Aneurysm of the ascending aorta, without rupture: Secondary | ICD-10-CM

## 2021-10-23 DIAGNOSIS — Z1389 Encounter for screening for other disorder: Secondary | ICD-10-CM

## 2021-10-23 DIAGNOSIS — E1169 Type 2 diabetes mellitus with other specified complication: Secondary | ICD-10-CM

## 2021-10-23 DIAGNOSIS — I493 Ventricular premature depolarization: Secondary | ICD-10-CM

## 2021-10-23 DIAGNOSIS — G4733 Obstructive sleep apnea (adult) (pediatric): Secondary | ICD-10-CM

## 2021-10-23 DIAGNOSIS — Z136 Encounter for screening for cardiovascular disorders: Secondary | ICD-10-CM

## 2021-10-23 DIAGNOSIS — K2100 Gastro-esophageal reflux disease with esophagitis, without bleeding: Secondary | ICD-10-CM

## 2021-10-23 NOTE — Patient Instructions (Signed)

## 2021-10-23 NOTE — Telephone Encounter (Signed)
Patient Advocate Encounter   Received notification from [employer] that prior authorization is required for Mounjaro 5 MG. PA submitted and APPROVED on 10/23/2021.  Key [id number] Effective: 09/23/2021 - 10/23/2022  Clista Bernhardt, CPhT Rx Patient Advocate Specialist Phone: [phone]

## 2021-10-23 NOTE — Progress Notes (Signed)
Complete Physical  Assessment and Plan:  Encounter for Annual Physical Exam with abnormal findings Due annually  Health Maintenance reviewed Healthy lifestyle reviewed and goals set Pneumonia boosted Check about shingrix   Essential hypertension Continue Olmesartan-HCTZ, bASA Discussed DASH (Dietary Approaches to Stop Hypertension) DASH diet is lower in sodium than a typical American diet. Cut back on foods that are high in saturated fat, cholesterol, and trans fats. Eat more whole-grain foods, fish, poultry, and nuts Remain active and exercise as tolerated daily.  Monitor BP at home-Call if greater than 130/80.  Check and monitor CMP/CBC   PVCs Follows with cardiology- improved. 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, Endocrinology Continue Darcel Bayley, Metformin, Humalog Education: Reviewed 'ABCs' of diabetes management  Discussed goals to be met and/or maintained include A1C (<7) Blood pressure (<130/80) Cholesterol (LDL <70) Continue Eye Exam yearly  Continue Dental Exam Q6 mo Discussed dietary recommendations Discussed Physical Activity recommendations Foot exam UTD Check and monitor A1C   CKD (chronic kidney disease) stage 2 with T2DM (HCC) Continue ARB, bASA, statin Discussed how what you eat and drink can aide in kidney protection. Stay well hydrated. Avoid high salt foods. Avoid NSAIDS. Keep BP and BG well controlled.   Take medications as prescribed. Remain active and exercise as tolerated daily. Maintain weight.  Continue to monitor. Check and monitor CMP/GFR/Microablumin   Hyperlipidemia Continue Atorvastatin. Discussed lifestyle modifications. Recommended diet heavy in fruits and veggies, omega 3's. Decrease consumption of animal meats, cheeses, and dairy products. Remain active and exercise as tolerated. Continue to monitor. Check and monitor lipids/TSH   Vitamin D deficiency Continue  supplement Monitor levels.  Gastroesophageal reflux disease with esophagitis No suspected reflux complications ([place]/stricture). Lifestyle modification:  wt loss, avoid meals 2-3h before bedtime. Consider eliminating food triggers:  chocolate, caffeine, EtOH, acid/spicy food. Continue screenings as suggested.  Hypoventilation syndrome Sleep apnea Continue CPAP Continue weight loss  Ventral hernia without obstruction or gangrene Continue to monitor  BPH (benign prostatic hypertrophy) Continue to follow with Urology PRN Continue Tamsulosin Symptoms stable  Erectile dysfunction associated with type 2 diabetes mellitus Sildenafil PRN  Elevated PSA Monitor PSA Continue following with [facility] Continue to monitor  Ascending aorta aneurysm Continue following with Cardiology  Control blood pressure, cholesterol, glucose, increase exercise.   B12 Deficiency Monitor levels  Screening for ischemic heart disease Monitor EKG  Screening for thyroid disorder Monitor TSH  Screening for blood or portein in urine Monitor UA  Medication Management All medications discussed and reviewed in full. All questions and concerns regarding medications addressed.    Orders Placed This Encounter  Procedures   CBC with Differential/Platelet   COMPLETE METABOLIC PANEL WITH GFR   Magnesium   Lipid panel   TSH   Hemoglobin A1c   VITAMIN D 25 Hydroxy (Vit-D Deficiency, Fractures)   Vitamin B12   Urinalysis, Routine w reflex microscopic   EKG 12-Lead   Discussed med's effects and SE's. Screening labs and tests as requested with regular follow-up as recommended.  Over 40 minutes of exam, counseling, chart review and critical decision making was performed  Future Appointments  Date Time Provider [place]  02/21/2022  4:00 PM [name], MD [facility] None  10/27/2022  3:00 PM Darrol Jump, NP [facility] None     HPI 66 y.o. male patient presents for a  complete physical. has Ventral hernia - periumbilical 0-8XK; Morbid obesity ([place]) - BMI 30+ with OSA; Hyperlipidemia associated with  type 2 diabetes mellitus ([place]); Essential hypertension; Vitamin D deficiency; GERD (gastroesophageal reflux disease); CKD stage 2 due to type 2 diabetes mellitus ([place]); Obesity hypoventilation syndrome (HCC); BPH (benign prostatic hyperplasia); Erectile dysfunction associated with type 2 diabetes mellitus ([place]); PVC's (premature ventricular contractions); Aneurysm, ascending aorta (HCC); LADA (latent autoimmune diabetes in adults), managed as type 2 ([place]); Family history of cerebrovascular disease; and OSA on CPAP on their problem list.    Over all patient reports feeling well today. He has no additional concerns or issues today.   Wife passed away from cancer in 03-May-2018 from breast cancer recurrence. He has 2 children, 2 grandkids. He works from office doing estimates.   He has OSA on CPAP, also with obesity hypoventilation syndrome and followed by neurology; he endorses 100% compliance with restorative sleep. He has xanax script for anxiety but rare use, last filled 30 tabs 01/02/2020.   BMI is Body mass index is 32.48 kg/m., he does somewhat watch his diet, light breakfast with oatmeal, English muffin, salad and chicken for lunch, etc; he admits to not exercising much, was walking 2-3 miles every other day. 2 lb Wt Readings from Last 3 Encounters:  10/23/21 253 lb (114.8 kg)  10/18/21 255 lb 6.4 oz (115.8 kg)  05/30/21 257 lb (116.6 kg)   He has known ascending aorta dilation, It was 4.2 cm in May 03, 2014, 4.1 cm per recent ECHO 11/2017 by cardiology, plan for follow up in 2 years. He had repeat stress test and ECHO in 11/2017 which were essentially normal. Follows with [name].  His blood pressure has been controlled at home, today their BP is BP: (!) 140/78 He does not workout. He denies chest pain, shortness of breath, dizziness.   He is on cholesterol  medication (atorvastatin 40 mg daily) and denies myalgias. His cholesterol is at goal. The cholesterol last visit was:   Lab Results  Component Value Date   CHOL 136 04/18/2021   HDL 46 04/18/2021   LDLCALC 63 04/18/2021   TRIG 203 (H) 04/18/2021   CHOLHDL 3.0 04/18/2021   He has been working on diet for LADA managed as T2 diabetes, now has freestyle [place], followed by [name], he is on bASA, he is on ACE/ARB and denies foot ulcerations, increased appetite, nausea, paresthesia of the feet, polydipsia, polyuria, visual disturbances, vomiting and weight loss.   Last A1C in the office was:  Lab Results  Component Value Date   HGBA1C 7.7 (A) 10/18/2021   He has CKD II associated with T2DM. Last GFR: Lab Results  Component Value Date   GFRNONAA >60 12/27/2020    Patient is on Vitamin D supplement, taking 5000 IU daily  Lab Results  Component Value Date   VD25OH 89 10/05/2020     Last PSA was: Lab Results  Component Value Date   PSA 4.1 (H) 09/21/2018   PSA 3.2 08/04/2017   PSA 3.0 06/02/2016  He was referred to [facility].  He endorses slow stream occasionally if he gets up late at night to urinate; typically 1-2 times in the evening.    Current Medications:  Current Outpatient Medications on File Prior to Visit  Medication Sig Dispense Refill   ALPRAZolam (XANAX) 1 MG tablet Take     1/2 to 1 tablet        2 to 3 x /day          as needed for Anxiety attack 30 tablet 0   aspirin 81 MG tablet  Take 81 mg by mouth daily.     atorvastatin (LIPITOR) 40 MG tablet TAKE 1 TABLET DAILY FOR CHOLESTEROL 90 tablet 3   BD INSULIN SYRINGE U/F 31G X 5/16" 0.5 ML MISC INJECT INSULIN TWICE A DAY AS DIRECTED 200 each 3   Cholecalciferol (VITAMIN D) 125 MCG (5000 UT) CAPS Take 10,000 Units by mouth daily. 60 capsule 0   Cyanocobalamin (B-12) 2500 MCG SUBL Place under the tongue daily.     finasteride (PROSCAR) 5 MG tablet Take 5 mg by mouth daily.     glucose 4 GM chewable tablet Chew  1 tablet (4 g total) by mouth as needed for low blood sugar. 30 tablet 12   glucose blood (FREESTYLE TEST STRIPS) test strip Use 1- 2 times a day with the freestyle libre 14.  CGM glucometer 150 each 3   HUMALOG 100 UNIT/ML injection INJECT 10 UNITS TO 12 UNITS BEFORE BREAKFAST AND 15 UNITS TO 20 UNITS BEFORE LUNCH AND DINNER 210 mL 0   injection device for insulin (CEQUR SIMPLICITY 2U) DEVI 1 each by Other route every 3 (three) days. 10 each 11   Injection Device for Insulin (CEQUR SIMPLICITY INSERTER) MISC Use as advised 1 each 0   Magnesium 500 MG TABS Take 1 tablet by mouth daily.     metFORMIN (GLUCOPHAGE-XR) 500 MG 24 hr tablet TAKE 2 TABLETS TWICE A DAY WITH MEALS FOR DIABETES 360 tablet 3   olmesartan-hydrochlorothiazide (BENICAR HCT) 40-25 MG tablet Take 1 tablet by mouth daily. 90 tablet 3   omeprazole (PRILOSEC) 20 MG capsule TAKE 1 CAPSULE DAILY FOR INDIGESTION AND HEARTBURN 90 capsule 3   potassium chloride SA (KLOR-CON M20) 20 MEQ tablet TAKE 1 TABLET TWICE A DAY FOR POTASSIUM 180 tablet 3   sildenafil (VIAGRA) 25 MG tablet Take 25 mg by mouth daily as needed for erectile dysfunction.     tamsulosin (FLOMAX) 0.4 MG CAPS capsule Take 0.4 mg by mouth daily.     TRESIBA FLEXTOUCH 200 UNIT/ML FlexTouch Pen Inject 48 Units into the skin daily. 27 mL 2   tirzepatide (MOUNJARO) 5 MG/0.5ML Pen Inject 5 mg into the skin once a week. (Patient not taking: Reported on 10/23/2021) 2 mL 3   No current facility-administered medications on file prior to visit.   Allergies:  Allergies  Allergen Reactions   Crestor [Rosuvastatin] Cough   Health Maintenance:  Immunization History  Administered Date(s) Administered   Fluad Quad(high Dose 65+) 12/14/2020   Influenza Inj Mdck Quad With Preservative 12/19/2016   Influenza Split 11/28/2013, 12/06/2014   Influenza, High Dose Seasonal PF 12/23/2019   Influenza,inj,Quad PF,6+ Mos 12/23/2017   Influenza-Unspecified 12/19/2011, 01/04/2013, 01/01/2016,  12/23/2017   Moderna Covid-19 Vaccine Bivalent Booster 14yr & up 03/18/2021   Moderna SARS-COV2 Booster Vaccination 07/24/2020   Moderna Sars-Covid-2 Vaccination 04/04/2019, 05/03/2019, 12/23/2019   Pneumococcal Conjugate-13 09/29/2019   Pneumococcal Polysaccharide-23 03/03/2006, 10/05/2020   Td 03/03/2008, 03/16/2018   Zoster, Live 02/07/2011   Tetanus: 03/2018 Pneumovax: 2008, TODAY Prevnar 13: 2021  Flu vaccine: 11/2021 [facility] Zostavax: 2012 - shingrix Covid 19: 2/2, 2021, moderna, booster x 3 11/2021   Colonoscopy: 12/2013 (Dr. BCristina Gong Had recent 2023 - 5 year recall Echo 11/2017 = has upcoming scheduled Stress test 11/2017 EGD: N/A  Eye Exam: Dr. SGershon Craneq yearly, last 07/2021, no retinopathy - he will schedule soon  Dentist: Dr. GCarman Ching BLorina Rabon last visit 2023, goes [id number]: [place] surgery completed x4 weeks ago, healing scar. - Follow in December  Patient Care Team: Unk Pinto, MD as PCP - General (Internal Medicine) Rutherford Guys, MD as Consulting Physician (Ophthalmology) Ronald Lobo, MD as Consulting Physician (Gastroenterology) Larey Dresser, MD as Consulting Physician (Cardiology) Skeet Latch, MD as Attending Physician (Cardiology) Janann August, MD as Referring Physician (Dermatology)  Medical History:  has Ventral hernia - periumbilical 4-0JW; Morbid obesity ([place]) - BMI 30+ with OSA; Hyperlipidemia associated with type 2 diabetes mellitus ([place]); Essential hypertension; Vitamin D deficiency; GERD (gastroesophageal reflux disease); CKD stage 2 due to type 2 diabetes mellitus ([place]); Obesity hypoventilation syndrome (HCC); BPH (benign prostatic hyperplasia); Erectile dysfunction associated with type 2 diabetes mellitus ([place]); PVC's (premature ventricular contractions); Aneurysm, ascending aorta (HCC); LADA (latent autoimmune diabetes in adults), managed as type 2 ([place]); Family history of cerebrovascular disease; and OSA on CPAP on their  problem list. Surgical History:  He  has a past surgical history that includes Anal fissure repair (2007); Ventral hernia repair (04/13/12); and Tonsillectomy (Bilateral). Family History:  His family history includes Atrial fibrillation in his mother; Cancer in his paternal grandfather; Cancer (age of onset: 29) in his paternal uncle; Dementia in his paternal grandmother; Kidney cancer (age of onset: 36) in his father; Stroke in his maternal grandmother. Social History:   reports that he has never smoked. He has never used smokeless tobacco. He reports current alcohol use of about 3.0 - 5.0 standard drinks of alcohol per week. He reports that he does not use drugs. Review of Systems:  Review of Systems  Constitutional:  Negative for malaise/fatigue and weight loss.  HENT:  Positive for congestion. Negative for ear pain, hearing loss, sinus pain, sore throat and tinnitus.   Eyes:  Negative for blurred vision and double vision.  Respiratory:  Positive for cough (intermittent, clear secretions). Negative for sputum production, shortness of breath and wheezing.   Cardiovascular:  Negative for chest pain, palpitations, orthopnea, claudication, leg swelling and PND.  Gastrointestinal:  Negative for abdominal pain, blood in stool, constipation, diarrhea, heartburn, melena, nausea and vomiting.  Genitourinary: Negative.   Musculoskeletal:  Negative for falls, joint pain and myalgias.  Skin:  Negative for rash.  Neurological:  Negative for dizziness, tingling, sensory change, weakness and headaches.  Endo/Heme/Allergies:  Negative for polydipsia.  Psychiatric/Behavioral: Negative.  Negative for depression, memory loss, substance abuse and suicidal ideas. The patient is not nervous/anxious and does not have insomnia.   All other systems reviewed and are negative.   Physical Exam: Estimated body mass index is 32.48 kg/m as calculated from the following:   Height as of this encounter: '6\' 2"'  (1.88 m).    Weight as of this encounter: 253 lb (114.8 kg). BP (!) 140/78   Pulse 78   Temp 97.7 F (36.5 C)   Ht '6\' 2"'  (1.88 m)   Wt 253 lb (114.8 kg)   SpO2 96%   BMI 32.48 kg/m  General Appearance: Well nourished, in no apparent distress.  Eyes: PERRLA, EOMs, conjunctiva no swelling or erythema Sinuses: No Frontal/maxillary tenderness  ENT/Mouth: Ext aud canals clear, normal light reflex with TMs without erythema, bulging. Good dentition. No erythema, swelling, or exudate on post pharynx. Tonsils not swollen or erythematous. Hearing normal.  Neck: Supple, thyroid normal. No bruits  Respiratory: Respiratory effort normal, BS equal bilaterally without rales, rhonchi, wheezing or stridor.  Cardio: RRR without murmurs, rubs or gallops. Brisk peripheral pulses without edema.  Chest: symmetric, with normal excursions and percussion.  Abdomen: Soft, obese abdomen, nontender, no guarding, rebound, masses, or organomegaly.  He has easily reduced ventral hernia.  Lymphatics: Non tender without lymphadenopathy.  Genitourinary: defer to urology Musculoskeletal: Full ROM all peripheral extremities,5/5 strength, and normal gait.  Skin: Warm, dry without rashes, ecchymosis. He has dark brown, raised 4 mm x 12 mm lesion to R mid back with crusting, irregular borders and coloring to R mid back (has seen derm and cleared). Scabbed lesion to bridge of nose.  Neuro: Cranial nerves intact, reflexes equal bilaterally. Normal muscle tone, no cerebellar symptoms. Sensation intact.  Psych: Awake and oriented X 3, normal affect, Insight and Judgment appropriate.   EKG:  NSR, IRBBB  Lexie Morini 3:35 PM [facility]

## 2021-10-24 LAB — CBC WITH DIFFERENTIAL/PLATELET
Absolute Monocytes: 548 cells/uL (ref 200–950)
Basophils Absolute: 63 cells/uL (ref 0–200)
Basophils Relative: 1 %
Eosinophils Absolute: 202 cells/uL (ref 15–500)
Eosinophils Relative: 3.2 %
HCT: 45.2 % (ref 38.5–50.0)
Hemoglobin: 15.1 g/dL (ref 13.2–17.1)
Lymphs Abs: 1323 cells/uL (ref 850–3900)
MCH: 28.8 pg (ref 27.0–33.0)
MCHC: 33.4 g/dL (ref 32.0–36.0)
MCV: 86.1 fL (ref 80.0–100.0)
MPV: 10.9 fL (ref 7.5–12.5)
Monocytes Relative: 8.7 %
Neutro Abs: 4164 cells/uL (ref 1500–7800)
Neutrophils Relative %: 66.1 %
Platelets: 200 10*3/uL (ref 140–400)
RBC: 5.25 10*6/uL (ref 4.20–5.80)
RDW: 12.8 % (ref 11.0–15.0)
Total Lymphocyte: 21 %
WBC: 6.3 10*3/uL (ref 3.8–10.8)

## 2021-10-24 LAB — COMPLETE METABOLIC PANEL WITH GFR
AG Ratio: 1.4 (calc) (ref 1.0–2.5)
ALT: 19 U/L (ref 9–46)
AST: 16 U/L (ref 10–35)
Albumin: 3.7 g/dL (ref 3.6–5.1)
Alkaline phosphatase (APISO): 85 U/L (ref 35–144)
BUN: 16 mg/dL (ref 7–25)
CO2: 28 mmol/L (ref 20–32)
Calcium: 9.4 mg/dL (ref 8.6–10.3)
Chloride: 98 mmol/L (ref 98–110)
Creat: 1.12 mg/dL (ref 0.70–1.35)
Globulin: 2.6 g/dL (calc) (ref 1.9–3.7)
Glucose, Bld: 315 mg/dL — ABNORMAL HIGH (ref 65–99)
Potassium: 3.7 mmol/L (ref 3.5–5.3)
Sodium: 135 mmol/L (ref 135–146)
Total Bilirubin: 0.8 mg/dL (ref 0.2–1.2)
Total Protein: 6.3 g/dL (ref 6.1–8.1)
eGFR: 72 mL/min/{1.73_m2} (ref >=60)

## 2021-10-24 LAB — HEMOGLOBIN A1C
Hgb A1c MFr Bld: 7.4 % of total Hgb — ABNORMAL HIGH (ref <5.7)
Mean Plasma Glucose: 166 mg/dL
eAG (mmol/L): 9.2 mmol/L

## 2021-10-24 LAB — VITAMIN D 25 HYDROXY (VIT D DEFICIENCY, FRACTURES): Vit D, 25-Hydroxy: 75 ng/mL (ref 30–100)

## 2021-10-24 LAB — URINALYSIS, ROUTINE W REFLEX MICROSCOPIC
Bilirubin Urine: NEGATIVE
Hgb urine dipstick: NEGATIVE
Ketones, ur: NEGATIVE
Leukocytes,Ua: NEGATIVE
Nitrite: NEGATIVE
Protein, ur: NEGATIVE
Specific Gravity, Urine: 1.026 (ref 1.001–1.035)
pH: <=5 (ref 5.0–8.0)

## 2021-10-24 LAB — LIPID PANEL
Cholesterol: 152 mg/dL (ref <200)
HDL: 49 mg/dL (ref >=40)
LDL Cholesterol (Calc): 75 mg/dL (calc)
Non-HDL Cholesterol (Calc): 103 mg/dL (calc) (ref <130)
Total CHOL/HDL Ratio: 3.1 (calc) (ref <5.0)
Triglycerides: 188 mg/dL — ABNORMAL HIGH (ref <150)

## 2021-10-24 LAB — TSH: TSH: 2.04 mIU/L (ref 0.40–4.50)

## 2021-10-24 LAB — MAGNESIUM: Magnesium: 1.8 mg/dL (ref 1.5–2.5)

## 2021-10-24 LAB — VITAMIN B12: Vitamin B-12: >2000 pg/mL — ABNORMAL HIGH (ref 200–1100)

## 2021-11-11 ENCOUNTER — Telehealth: Payer: Self-pay | Admitting: Nurse Practitioner

## 2021-11-11 ENCOUNTER — Other Ambulatory Visit: Payer: Self-pay | Admitting: Nurse Practitioner

## 2021-11-11 DIAGNOSIS — K2100 Gastro-esophageal reflux disease with esophagitis, without bleeding: Secondary | ICD-10-CM

## 2021-11-11 MED ORDER — OMEPRAZOLE 20 MG PO CPDR: DELAYED_RELEASE_CAPSULE | ORAL | 3 refills | Status: DC

## 2021-11-11 NOTE — Telephone Encounter (Signed)
Done

## 2021-11-11 NOTE — Telephone Encounter (Signed)
Patient has to stop using mail order due to changes in insurance. Will you please send in rx [facility] to [facility] [address]?

## 2021-11-18 ENCOUNTER — Telehealth: Payer: Self-pay | Admitting: Nurse Practitioner

## 2021-11-18 DIAGNOSIS — K2100 Gastro-esophageal reflux disease with esophagitis, without bleeding: Secondary | ICD-10-CM

## 2021-11-18 DIAGNOSIS — I10 Essential (primary) hypertension: Secondary | ICD-10-CM

## 2021-11-18 MED ORDER — OMEPRAZOLE 20 MG PO CPDR: DELAYED_RELEASE_CAPSULE | ORAL | 3 refills | Status: DC

## 2021-11-18 MED ORDER — OLMESARTAN MEDOXOMIL-HCTZ 40-25 MG PO TABS: 1.0000 | ORAL_TABLET | Freq: Every day | ORAL | 3 refills | Status: DC

## 2021-11-18 NOTE — Telephone Encounter (Signed)
Needing refill on Olmesartan and Omeprazole to be sent to a new pharm: [facility] [address]

## 2021-11-18 NOTE — Addendum Note (Signed)
Addended by: Chancy Hurter on: 11/18/2021 02:21 PM   Modules accepted: Orders

## 2021-11-19 ENCOUNTER — Telehealth: Payer: Self-pay | Admitting: Nurse Practitioner

## 2021-11-19 DIAGNOSIS — K2100 Gastro-esophageal reflux disease with esophagitis, without bleeding: Secondary | ICD-10-CM

## 2021-11-19 DIAGNOSIS — I10 Essential (primary) hypertension: Secondary | ICD-10-CM

## 2021-11-19 MED ORDER — OLMESARTAN MEDOXOMIL-HCTZ 40-25 MG PO TABS: 1.0000 | ORAL_TABLET | Freq: Every day | ORAL | 3 refills | Status: DC

## 2021-11-19 MED ORDER — OMEPRAZOLE 20 MG PO CPDR: DELAYED_RELEASE_CAPSULE | ORAL | 3 refills | Status: DC

## 2021-11-19 NOTE — Addendum Note (Signed)
Addended by: Chancy Hurter on: 11/19/2021 10:02 AM   Modules accepted: Orders

## 2021-11-19 NOTE — Telephone Encounter (Signed)
Olmesartan and Omeprazole were sent to the wrong pharmacy. They were supposed to be sent to [facility] on [address]. [facility] re[facility]send [facility]

## 2021-11-25 ENCOUNTER — Telehealth: Payer: Self-pay | Admitting: Nurse Practitioner

## 2021-11-25 ENCOUNTER — Encounter: Payer: Self-pay | Admitting: Internal Medicine

## 2021-11-25 MED ORDER — POTASSIUM CHLORIDE CRYS ER 20 MEQ PO TBCR: EXTENDED_RELEASE_TABLET | ORAL | 3 refills | Status: DC

## 2021-11-25 NOTE — Telephone Encounter (Signed)
PT NEED A REFILL ON KLOR-CON TO A NEW PHARM [facility] FOR 90 DAYS REFILL HE ALSO HAS NEW INS UNTIED HEALTHCARE- PHONE NUMBER [phone]

## 2021-11-25 NOTE — Addendum Note (Signed)
Addended by: Chancy Hurter on: 11/25/2021 11:50 AM   Modules accepted: Orders

## 2021-11-26 ENCOUNTER — Other Ambulatory Visit: Payer: Self-pay | Admitting: Internal Medicine

## 2021-11-26 MED ORDER — CEQUR SIMPLICITY INSERTER MISC: 0 refills | Status: DC

## 2021-11-26 MED ORDER — DEXCOM G7 RECEIVER DEVI: 1.0000 | Freq: Once | 0 refills | Status: AC

## 2021-11-26 MED ORDER — DEXCOM G7 SENSOR MISC: 3.0000 | 4 refills | Status: DC

## 2021-11-26 MED ORDER — TIRZEPATIDE 5 MG/0.5ML ~~LOC~~ SOAJ: 5.0000 mg | SUBCUTANEOUS | 3 refills | Status: DC

## 2021-11-26 MED ORDER — INSULIN LISPRO 100 UNIT/ML IJ SOLN: INTRAMUSCULAR | 3 refills | Status: DC

## 2021-11-26 MED ORDER — CEQUR SIMPLICITY 2U DEVI: 1.0000 | 11 refills | Status: DC

## 2021-11-26 NOTE — Telephone Encounter (Signed)
I am not sure if DR. Gherghe put in the script for the [place],  IF he wants to start the [place] before I return, please call: Scot Dock: [phone].  HE can train him and answer all of his questions.

## 2021-11-27 ENCOUNTER — Telehealth: Payer: Self-pay

## 2021-11-27 ENCOUNTER — Other Ambulatory Visit (HOSPITAL_COMMUNITY): Payer: Self-pay

## 2021-11-27 NOTE — Telephone Encounter (Signed)
Patient Advocate Encounter   Received notification that prior authorization for Dexcom G7 Sensor is required/requested.   PA submitted on 11/27/21 to [facility] via CoverMyMeds Key [id number] Status is pending

## 2021-11-27 NOTE — Telephone Encounter (Signed)
Patient Advocate Encounter  Prior Authorization for [employer] has been approved.    key [id number] Effective dates: 11/27/21 through 11/28/22  Spoke with Pharmacy to process.

## 2021-11-28 ENCOUNTER — Other Ambulatory Visit: Payer: Self-pay | Admitting: Internal Medicine

## 2021-12-02 ENCOUNTER — Other Ambulatory Visit: Payer: Self-pay

## 2021-12-02 ENCOUNTER — Telehealth: Payer: Self-pay

## 2021-12-02 ENCOUNTER — Other Ambulatory Visit (HOSPITAL_COMMUNITY): Payer: Self-pay

## 2021-12-02 NOTE — Telephone Encounter (Signed)
Patient Advocate Encounter  Prior Authorization for Mounjaro '5MG'$ /0.5ML pen-injectors has been approved.    PA# [id number] Key: [id number]  Effective dates: 12/02/2021 through 12/03/2022

## 2021-12-02 NOTE — Telephone Encounter (Signed)
Patient Advocate Encounter   Received notification that prior authorization for Mounjaro '5MG'$ /0.5ML pen-injectors is required/requested.   PA submitted on 12/02/21 to [facility] via CoverMyMeds  Key [id number] Status is pending

## 2021-12-10 ENCOUNTER — Telehealth: Payer: Self-pay | Admitting: Dietician

## 2021-12-10 NOTE — Telephone Encounter (Signed)
Returned patient call.  He is changing from a [place] to a [employer] and will be using his phone as the receiver.  He will be purchasing the new iphone in about 3 weeks. Appointment set up for training.  Instructed him to put the [place] app on his phone and sign up for a Dexcom account prior to the appointment.  Antonieta Iba, RD, LDN, CDCES

## 2021-12-23 ENCOUNTER — Encounter: Payer: Self-pay | Admitting: Internal Medicine

## 2021-12-25 ENCOUNTER — Other Ambulatory Visit: Payer: Self-pay | Admitting: Nurse Practitioner

## 2021-12-25 ENCOUNTER — Telehealth: Payer: Self-pay | Admitting: Nurse Practitioner

## 2021-12-25 MED ORDER — ATORVASTATIN CALCIUM 40 MG PO TABS: 40.0000 mg | ORAL_TABLET | Freq: Every day | ORAL | 3 refills | Status: DC

## 2021-12-25 NOTE — Telephone Encounter (Signed)
Pt is using anew pharmacy, wanting prescription Atorvastatin [facility]

## 2022-01-02 ENCOUNTER — Encounter: Payer: 59 | Attending: Internal Medicine | Admitting: Dietician

## 2022-01-02 ENCOUNTER — Telehealth: Payer: Self-pay | Admitting: Internal Medicine

## 2022-01-02 DIAGNOSIS — Z794 Long term (current) use of insulin: Secondary | ICD-10-CM | POA: Insufficient documentation

## 2022-01-02 DIAGNOSIS — E139 Other specified diabetes mellitus without complications: Secondary | ICD-10-CM

## 2022-01-02 DIAGNOSIS — E1169 Type 2 diabetes mellitus with other specified complication: Secondary | ICD-10-CM | POA: Insufficient documentation

## 2022-01-02 MED ORDER — TRESIBA FLEXTOUCH 200 UNIT/ML ~~LOC~~ SOPN: 48.0000 [IU] | PEN_INJECTOR | Freq: Every day | SUBCUTANEOUS | 1 refills | Status: DC

## 2022-01-02 MED ORDER — DEXCOM G7 SENSOR MISC: 3.0000 | 1 refills | Status: DC

## 2022-01-02 MED ORDER — CEQUR SIMPLICITY 2U DEVI: 1.0000 | 1 refills | Status: DC

## 2022-01-02 NOTE — Telephone Encounter (Signed)
MEDICATION: TRESIBA FLEXTOUCH 200 UNIT/ML FlexTouch Pen  and Continuous Blood Gluc Sensor (DEXCOM G7 SENSOR) MISC  And injection device for insulin (CEQUR SIMPLICITY 2U) DEVI  PHARMACY:    [facility] ([place], [place] (Ph: [phone])    HAS THE PATIENT CONTACTED THEIR PHARMACY?  No  IS THIS A 90 DAY SUPPLY : Yes  IS PATIENT OUT OF MEDICATION: No  IF NOT; HOW MUCH IS LEFT: About 30 day supply of each  LAST APPOINTMENT DATE: '@8'$ /18/2023  NEXT APPOINTMENT DATE:'@12'$ /22/2023  DO WE HAVE YOUR PERMISSION TO LEAVE A DETAILED MESSAGE?: Yes  OTHER COMMENTS:    **Let patient know to contact pharmacy at the end of the day to make sure medication is ready. **  ** Please notify patient to allow 48-72 hours to process**  **Encourage patient to contact the pharmacy for refills or they can request refills through [facility]**

## 2022-01-02 NOTE — Telephone Encounter (Signed)
Rx sent to preferred pharmacy.

## 2022-01-02 NOTE — Progress Notes (Signed)
Dexcom G7Personal CGM Training  Start time:1310    End time: 4037 Total time: 51 Harry [name] was educated about the following:  -Getting to know device    (Phone programmed ) -Setting up device (high alert  250  , low alert 70  ) -Discussed alert profile -Inserting sensor (  right upper arm      WNL) -when to finger stick -Ending sensor session -Trouble shooting -Tape guide, clarity information  -Reviewed insulin dosing from dexcom.  -added patient to [facility] sharing  Patient has [facility] tech support and my contact information.

## 2022-01-08 ENCOUNTER — Encounter: Payer: Self-pay | Admitting: Nurse Practitioner

## 2022-01-08 ENCOUNTER — Ambulatory Visit: Payer: 59 | Admitting: Nurse Practitioner

## 2022-01-08 VITALS — BP 132/70 | HR 81 | Temp 97.8°F | Resp 16 | Ht 74.0 in | Wt 246.0 lb

## 2022-01-08 DIAGNOSIS — M545 Low back pain, unspecified: Secondary | ICD-10-CM | POA: Diagnosis not present

## 2022-01-08 DIAGNOSIS — N3000 Acute cystitis without hematuria: Secondary | ICD-10-CM | POA: Diagnosis not present

## 2022-01-08 DIAGNOSIS — R1030 Lower abdominal pain, unspecified: Secondary | ICD-10-CM | POA: Diagnosis not present

## 2022-01-08 DIAGNOSIS — E860 Dehydration: Secondary | ICD-10-CM | POA: Diagnosis not present

## 2022-01-08 MED ORDER — CIPROFLOXACIN HCL 250 MG PO TABS: 250.0000 mg | ORAL_TABLET | Freq: Two times a day (BID) | ORAL | 0 refills | Status: AC

## 2022-01-08 NOTE — Patient Instructions (Signed)
Urinary Tract Infection, Adult A urinary tract infection (UTI) is an infection of any part of the urinary tract. The urinary tract includes: The kidneys. The ureters. The bladder. The urethra. These organs make, store, and get rid of pee (urine) in the body. What are the causes? This infection is caused by germs (bacteria) in your genital area. These germs grow and cause swelling (inflammation) of your urinary tract. What increases the risk? The following factors may make you more likely to develop this condition: Using a small, thin tube (catheter) to drain pee. Not being able to control when you pee or poop (incontinence). Being male. If you are male, these things can increase the risk: Using these methods to prevent pregnancy: A medicine that kills sperm (spermicide). A device that blocks sperm (diaphragm). Having low levels of a male hormone (estrogen). Being pregnant. You are more likely to develop this condition if: You have genes that add to your risk. You are sexually active. You take antibiotic medicines. You have trouble peeing because of: A prostate that is bigger than normal, if you are male. A blockage in the part of your body that drains pee from the bladder. A kidney stone. A nerve condition that affects your bladder. Not getting enough to drink. Not peeing often enough. You have other conditions, such as: Diabetes. A weak disease-fighting system (immune system). Sickle cell disease. Gout. Injury of the spine. What are the signs or symptoms? Symptoms of this condition include: Needing to pee right away. Peeing small amounts often. Pain or burning when peeing. Blood in the pee. Pee that smells bad or not like normal. Trouble peeing. Pee that is cloudy. Fluid coming from the vagina, if you are male. Pain in the belly or lower back. Other symptoms include: Vomiting. Not feeling hungry. Feeling mixed up (confused). This may be the first symptom in  older adults. Being tired and grouchy (irritable). A fever. Watery poop (diarrhea). How is this treated? Taking antibiotic medicine. Taking other medicines. Drinking enough water. In some cases, you may need to see a specialist. Follow these instructions at home:  Medicines Take over-the-counter and prescription medicines only as told by your doctor. If you were prescribed an antibiotic medicine, take it as told by your doctor. Do not stop taking it even if you start to feel better. General instructions Make sure you: Pee until your bladder is empty. Do not hold pee for a long time. Empty your bladder after sex. Wipe from front to back after peeing or pooping if you are a male. Use each tissue one time when you wipe. Drink enough fluid to keep your pee pale yellow. Keep all follow-up visits. Contact a doctor if: You do not get better after 1-2 days. Your symptoms go away and then come back. Get help right away if: You have very bad back pain. You have very bad pain in your lower belly. You have a fever. You have chills. You feeling like you will vomit or you vomit. Summary A urinary tract infection (UTI) is an infection of any part of the urinary tract. This condition is caused by germs in your genital area. There are many risk factors for a UTI. Treatment includes antibiotic medicines. Drink enough fluid to keep your pee pale yellow. This information is not intended to replace advice given to you by your health care provider. Make sure you discuss any questions you have with your health care provider. Document Revised: 09/30/2019 Document Reviewed: 09/30/2019 Elsevier Patient Education    2023 Elsevier Inc.  

## 2022-01-08 NOTE — Progress Notes (Signed)
Assessment and Plan:  Harry [name] was seen today for an episodic visit.  Diagnoses and all order for this visit:  1. Acute cystitis without hematuria Stay well hydrated Recommended cranberry supplement   - ciprofloxacin (CIPRO) 250 MG tablet; Take 1 tablet (250 mg total) by mouth 2 (two) times daily for 10 days.  Dispense: 20 tablet; Refill: 0  2. Acute bilateral low back pain without sciatica Continue to monitor for worsening symptoms including associated N/V, abdomina pain, fever, Report to ER as this could be a more serious infection.  - CBC with Differential/Platelet - COMPLETE METABOLIC PANEL WITH GFR - Urinalysis, Routine w reflex microscopic  3. Groin pain, unspecified laterality Less likely nephrolithiasis but reviewed - pt has no hx Continue to monitor for hematuria. Notify office if symptoms fail to improve  - CBC with Differential/Platelet - COMPLETE METABOLIC PANEL WITH GFR - Urinalysis, Routine w reflex microscopic  4. Dehydration Stay well hydrated to keep system flushed  - CBC with Differential/Platelet - COMPLETE METABOLIC PANEL WITH GFR - Urinalysis, Routine w reflex microscopic  Notify office for further evaluation and treatment, questions or concerns if s/s fail to improve. The risks and benefits of my recommendations, as well as other treatment options were discussed with the patient today. Questions were answered.  Further disposition pending results of labs. Discussed med's effects and SE's.    Over 20 minutes of exam, counseling, chart review, and critical decision making was performed.   Future Appointments  Date Time Provider [place]  02/18/2022  4:00 PM Darrol Jump, NP [facility] None  02/21/2022  4:00 PM [name], MD [facility] None  10/27/2022  3:00 PM Dahlia Nifong, Kenney Houseman, NP [facility] None     ------------------------------------------------------------------------------------------------------------------   HPI BP 132/70   Pulse 81   Temp 97.8 F (36.6 C)   Resp 16   Ht '6\' 2"'$  (1.88 m)   Wt 246 lb (111.6 kg)   SpO2 98%   BMI 31.58 kg/m   66 y.o.male presents for evaluation of low back pain associated with CVA tenderness.  Pain radiates bilaterallty and at times around to groin.  Onset was approximatly 2 weeks ago after a heavy weekend of drinking acohold. Aggravated by movement, pressure, sitting, bending. Denies urinary frequency, urgency, fever, chills, N/V, lower abd pain.  Marland KitchenHe has been trying to stay well hydrated.   He was seen by [facility] on October 13th. 2023. History of elevated PSA, ED managed with Sildenafil and BPH with [facility] currently on 0.8 mg Tamsulosin and 5 mg finasteride. PSA has been fluctuating over the years. It is most recently 4.56. It has been rising while starting finasteride. He did have an MRI of the prostate in December of 2022 that did not show any targetable lesions. Plan is stated that despite having a negative MRI of the prostate, proceeding with a prostate biopsy to evaluate for prostate cancer. Plans to have biopsy 02/17/22. Medications are continued as directed.  He denies any scrotal pain or perineum pain.      Past Medical History:  Diagnosis Date   Diabetes mellitus without complication ([place]) 9509   insulin requiring   Elevated PSA 09/22/2018   [facility] is following    GERD (gastroesophageal reflux disease)    Hyperlipidemia    Hypertension    Hypokalemia    Hypomagnesemia    OSA on CPAP    10-12 years ago   Vitamin D deficiency      Allergies  Allergen Reactions  Crestor [Rosuvastatin] Cough    Current Outpatient Medications on File Prior to Visit  Medication Sig   ALPRAZolam (XANAX) 1 MG tablet Take     1/2 to 1 tablet        2 to 3 x /day          as needed for Anxiety attack   aspirin 81 MG tablet  Take 81 mg by mouth daily.   atorvastatin (LIPITOR) 40 MG tablet Take 1 tablet (40 mg total) by mouth daily. for cholesterol   BD INSULIN SYRINGE U/F 31G X 5/16" 0.5 ML MISC INJECT INSULIN TWICE A DAY AS DIRECTED   Cholecalciferol (VITAMIN D) 125 MCG (5000 UT) CAPS Take 10,000 Units by mouth daily.   Continuous Blood Gluc Sensor (DEXCOM G7 SENSOR) MISC 3 each by Does not apply route every 30 (thirty) days. Apply 1 sensor every 10 days   Cyanocobalamin (B-12) 2500 MCG SUBL Place under the tongue daily.   finasteride (PROSCAR) 5 MG tablet Take 5 mg by mouth daily.   glucose 4 GM chewable tablet Chew 1 tablet (4 g total) by mouth as needed for low blood sugar.   glucose blood (FREESTYLE TEST STRIPS) test strip Use 1- 2 times a day with the freestyle libre 14.  CGM glucometer   injection device for insulin (CEQUR SIMPLICITY 2U) DEVI 1 each by Other route every 3 (three) days.   Injection Device for Insulin (CEQUR SIMPLICITY INSERTER) MISC Use as advised   insulin lispro (HUMALOG) 100 UNIT/ML injection INJECT 10 UNITS TO 12 UNITS BEFORE BREAKFAST AND 15 UNITS TO 20 UNITS BEFORE LUNCH AND DINNER   Magnesium 500 MG TABS Take 1 tablet by mouth daily.   metFORMIN (GLUCOPHAGE-XR) 500 MG 24 hr tablet TAKE 2 TABLETS TWICE A DAY WITH MEALS FOR DIABETES   olmesartan-hydrochlorothiazide (BENICAR HCT) 40-25 MG tablet Take 1 tablet by mouth daily.   omeprazole (PRILOSEC) 20 MG capsule TAKE 1 CAPSULE DAILY FOR INDIGESTION AND HEARTBURN   potassium chloride SA (KLOR-CON M20) 20 MEQ tablet TAKE 1 TABLET TWICE A DAY FOR POTASSIUM   sildenafil (VIAGRA) 25 MG tablet Take 25 mg by mouth daily as needed for erectile dysfunction.   tamsulosin (FLOMAX) 0.4 MG CAPS capsule Take 0.4 mg by mouth daily.   tirzepatide [facility]) 5 MG/0.5ML Pen Inject 5 mg into the skin once a week.   TRESIBA FLEXTOUCH 200 UNIT/ML FlexTouch Pen Inject 48 Units into the skin daily.   No current facility-administered medications on file prior to  visit.    ROS: all negative except what is noted in the HPI.   Physical Exam:  BP 132/70   Pulse 81   Temp 97.8 F (36.6 C)   Resp 16   Ht '6\' 2"'$  (1.88 m)   Wt 246 lb (111.6 kg)   SpO2 98%   BMI 31.58 kg/m   General Appearance: NAD.  Awake, conversant and cooperative. Eyes: PERRLA, EOMs intact.  Sclera white.  Conjunctiva without erythema. Sinuses: No frontal/maxillary tenderness.  No nasal discharge. Nares patent.  ENT/Mouth: Ext aud canals clear.  Bilateral TMs w/DOL and without erythema or bulging. Hearing intact.  Posterior pharynx without swelling or exudate.  Tonsils without swelling or erythema.  Neck: Supple.  No masses, nodules or thyromegaly. Respiratory: Effort is regular with non-labored breathing. Breath sounds are equal bilaterally without rales, rhonchi, wheezing or stridor. Cardio: RRR with no MRGs. Brisk peripheral pulses without edema.  Abdomen: Active BS in all four quadrants.  Soft and non-tender  without guarding, rebound tenderness, hernias or masses. Lymphatics: Non tender without lymphadenopathy.  Musculoskeletal: Mild const-verterbral tenderness upon palpation.  Full ROM, 5/5 strength, normal ambulation.  No clubbing or cyanosis. Skin: Appropriate color for ethnicity. Warm without rashes, lesions, ecchymosis, ulcers.  Neuro: CN II-XII grossly normal. Normal muscle tone without cerebellar symptoms and intact sensation.   Psych: AO X 3,  appropriate mood and affect, insight and judgment.     Darrol Jump, NP 9:29 AM [facility]

## 2022-01-09 LAB — CBC WITH DIFFERENTIAL/PLATELET
Absolute Monocytes: 570 cells/uL (ref 200–950)
Basophils Absolute: 101 cells/uL (ref 0–200)
Basophils Relative: 1.5 %
Eosinophils Absolute: 107 cells/uL (ref 15–500)
Eosinophils Relative: 1.6 %
HCT: 47.3 % (ref 38.5–50.0)
Hemoglobin: 15.6 g/dL (ref 13.2–17.1)
Lymphs Abs: 1273 cells/uL (ref 850–3900)
MCH: 28.3 pg (ref 27.0–33.0)
MCHC: 33 g/dL (ref 32.0–36.0)
MCV: 85.8 fL (ref 80.0–100.0)
MPV: 10.5 fL (ref 7.5–12.5)
Monocytes Relative: 8.5 %
Neutro Abs: 4650 cells/uL (ref 1500–7800)
Neutrophils Relative %: 69.4 %
Platelets: 221 10*3/uL (ref 140–400)
RBC: 5.51 10*6/uL (ref 4.20–5.80)
RDW: 13.1 % (ref 11.0–15.0)
Total Lymphocyte: 19 %
WBC: 6.7 10*3/uL (ref 3.8–10.8)

## 2022-01-09 LAB — URINALYSIS, ROUTINE W REFLEX MICROSCOPIC
Bilirubin Urine: NEGATIVE
Glucose, UA: NEGATIVE
Hgb urine dipstick: NEGATIVE
Ketones, ur: NEGATIVE
Leukocytes,Ua: NEGATIVE
Nitrite: NEGATIVE
Protein, ur: NEGATIVE
Specific Gravity, Urine: 1.021 (ref 1.001–1.035)
pH: <=5 (ref 5.0–8.0)

## 2022-01-09 LAB — COMPLETE METABOLIC PANEL WITH GFR
AG Ratio: 1.6 (calc) (ref 1.0–2.5)
ALT: 16 U/L (ref 9–46)
AST: 15 U/L (ref 10–35)
Albumin: 3.9 g/dL (ref 3.6–5.1)
Alkaline phosphatase (APISO): 76 U/L (ref 35–144)
BUN: 18 mg/dL (ref 7–25)
CO2: 29 mmol/L (ref 20–32)
Calcium: 9.5 mg/dL (ref 8.6–10.3)
Chloride: 102 mmol/L (ref 98–110)
Creat: 0.85 mg/dL (ref 0.70–1.35)
Globulin: 2.4 g/dL (calc) (ref 1.9–3.7)
Glucose, Bld: 153 mg/dL — ABNORMAL HIGH (ref 65–99)
Potassium: 3.8 mmol/L (ref 3.5–5.3)
Sodium: 139 mmol/L (ref 135–146)
Total Bilirubin: 0.7 mg/dL (ref 0.2–1.2)
Total Protein: 6.3 g/dL (ref 6.1–8.1)
eGFR: 95 mL/min/{1.73_m2} (ref >=60)

## 2022-01-13 ENCOUNTER — Telehealth: Payer: Self-pay | Admitting: Pharmacy Technician

## 2022-01-13 ENCOUNTER — Other Ambulatory Visit (HOSPITAL_COMMUNITY): Payer: Self-pay

## 2022-01-13 ENCOUNTER — Encounter: Payer: Self-pay | Admitting: Internal Medicine

## 2022-01-13 DIAGNOSIS — E139 Other specified diabetes mellitus without complications: Secondary | ICD-10-CM

## 2022-01-13 NOTE — Telephone Encounter (Signed)
Pharmacy Patient Advocate Encounter   Received notification from Pt/[employer] that prior authorization for Harry [name] is required/requested.  Started PA in CoverMyMeds. Ins requests documentation of history of trial and failure, inadequate response, or intolerance to Lantus and/or Toujeo.  I only see that the pt has been on Lantus before, but changed due to ins.

## 2022-01-14 NOTE — Telephone Encounter (Signed)
Pharmacy Patient Advocate Encounter   PA submitted on 01/14/22 to [employer] via CoverMyMeds Key [id number] Status is pending

## 2022-01-16 MED ORDER — TOUJEO MAX SOLOSTAR 300 UNIT/ML ~~LOC~~ SOPN: 44.0000 [IU] | PEN_INJECTOR | Freq: Every day | SUBCUTANEOUS | 2 refills | Status: DC

## 2022-01-16 NOTE — Telephone Encounter (Signed)
Received a notification regarding Prior Authorization from [employer] for [employer].   Authorization has been DENIED because the medication is not covered because it is not on the listing or formulary of approved drugs for you plan benefit.   Harry [name] is covered if the doctor submits medical records documenting inadequate response, failed, or cannot use both of the following for a 3 month trial: Lantus and Toujeo.

## 2022-01-16 NOTE — Telephone Encounter (Signed)
Rx sent to pharmacy   

## 2022-01-20 ENCOUNTER — Other Ambulatory Visit: Payer: Self-pay

## 2022-01-20 DIAGNOSIS — K2100 Gastro-esophageal reflux disease with esophagitis, without bleeding: Secondary | ICD-10-CM

## 2022-01-20 DIAGNOSIS — I10 Essential (primary) hypertension: Secondary | ICD-10-CM

## 2022-01-20 MED ORDER — OMEPRAZOLE 20 MG PO CPDR: DELAYED_RELEASE_CAPSULE | ORAL | 3 refills | Status: DC

## 2022-01-20 MED ORDER — OLMESARTAN MEDOXOMIL-HCTZ 40-25 MG PO TABS: 1.0000 | ORAL_TABLET | Freq: Every day | ORAL | 3 refills | Status: DC

## 2022-02-18 ENCOUNTER — Ambulatory Visit (INDEPENDENT_AMBULATORY_CARE_PROVIDER_SITE_OTHER): Payer: 59 | Admitting: Nurse Practitioner

## 2022-02-18 ENCOUNTER — Encounter: Payer: Self-pay | Admitting: Nurse Practitioner

## 2022-02-18 VITALS — BP 136/78 | HR 69 | Temp 97.1°F | Ht 74.0 in | Wt 245.0 lb

## 2022-02-18 DIAGNOSIS — E1169 Type 2 diabetes mellitus with other specified complication: Secondary | ICD-10-CM

## 2022-02-18 DIAGNOSIS — E1122 Type 2 diabetes mellitus with diabetic chronic kidney disease: Secondary | ICD-10-CM | POA: Diagnosis not present

## 2022-02-18 DIAGNOSIS — Z79899 Other long term (current) drug therapy: Secondary | ICD-10-CM

## 2022-02-18 DIAGNOSIS — I7121 Aneurysm of the ascending aorta, without rupture: Secondary | ICD-10-CM

## 2022-02-18 DIAGNOSIS — K2100 Gastro-esophageal reflux disease with esophagitis, without bleeding: Secondary | ICD-10-CM

## 2022-02-18 DIAGNOSIS — I10 Essential (primary) hypertension: Secondary | ICD-10-CM | POA: Diagnosis not present

## 2022-02-18 DIAGNOSIS — E785 Hyperlipidemia, unspecified: Secondary | ICD-10-CM

## 2022-02-18 DIAGNOSIS — E559 Vitamin D deficiency, unspecified: Secondary | ICD-10-CM | POA: Diagnosis not present

## 2022-02-18 DIAGNOSIS — N182 Chronic kidney disease, stage 2 (mild): Secondary | ICD-10-CM

## 2022-02-18 NOTE — Progress Notes (Signed)
Complete Physical  Assessment and Plan:  Essential hypertension Continue Olmesartan-HCTZ, bASA Discussed DASH (Dietary Approaches to Stop Hypertension) DASH diet is lower in sodium than a typical American diet. Cut back on foods that are high in saturated fat, cholesterol, and trans fats. Eat more whole-grain foods, fish, poultry, and nuts Remain active and exercise as tolerated daily.  Monitor BP at home-Call if greater than 130/80.  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, Endocrinology Continue Darcel Bayley, Metformin, Humalog Education: Reviewed 'ABCs' of diabetes management  Discussed goals to be met and/or maintained include A1C (<7) Blood pressure (<130/80) Cholesterol (LDL <70) Continue Eye Exam yearly  Continue Dental Exam Q6 mo Discussed dietary recommendations Discussed Physical Activity recommendations Foot exam UTD Check and monitor A1C   CKD (chronic kidney disease) stage 2 with T2DM ([place]) Discussed how what you eat and drink can aide in kidney protection. Stay well hydrated. Avoid high salt foods. Avoid NSAIDS. Keep BP and BG well controlled.   Take medications as prescribed. Remain active and exercise as tolerated daily. Maintain weight.  Continue to monitor. Check and monitor CMP/GFR/Microablumin   Hyperlipidemia Continue Atorvastatin. Discussed lifestyle modifications. Recommended diet heavy in fruits and veggies, omega 3's. Decrease consumption of animal meats, cheeses, and dairy products. Remain active and exercise as tolerated. Continue to monitor. Check and monitor lipids/TSH   Vitamin D deficiency Continue supplement Monitor levels.  Gastroesophageal reflux disease with esophagitis No suspected reflux complications ([place]/stricture). Lifestyle modification:  wt loss, avoid meals 2-3h before bedtime. Consider eliminating food triggers:  chocolate, caffeine, EtOH, acid/spicy  food. Continue screenings as suggested.  Ascending aorta aneurysm Continue following with Cardiology  Control blood pressure, cholesterol, glucose, increase exercise.   Medication Management All medications discussed and reviewed in full. All questions and concerns regarding medications addressed.    No orders of the defined types were placed in this encounter.  Labs completed 01/2022 and reviewed.    Discussed med's effects and SE's. Screening labs and tests as requested with regular follow-up as recommended.  Over 20 minutes of exam, counseling, chart review and critical decision making was performed  Future Appointments  Date Time Provider [place]  02/21/2022  4:00 PM [name], MD [facility] None  10/27/2022  3:00 PM Darrol Jump, NP [facility] None     HPI 66 y.o. male patient presents for a general follow up. has Ventral hernia - periumbilical 1-9ER; Morbid obesity ([place]) - BMI 30+ with OSA; Hyperlipidemia associated with type 2 diabetes mellitus ([place]); Essential hypertension; Vitamin D deficiency; GERD (gastroesophageal reflux disease); CKD stage 2 due to type 2 diabetes mellitus ([place]); Obesity hypoventilation syndrome (HCC); BPH (benign prostatic hyperplasia); Erectile dysfunction associated with type 2 diabetes mellitus ([place]); PVC's (premature ventricular contractions); Aneurysm, ascending aorta (HCC); LADA (latent autoimmune diabetes in adults), managed as type 2 ([place]); Family history of cerebrovascular disease; and OSA on CPAP on their problem list.    Over all patient reports feeling well today. He has no additional concerns or issues today.  He is now on Dexcom. A1c running 6.6% down from 7.2% in 10/2021.  Continuing to follow with Urology.  Takeing medications for BPH as precsribed. Effective.   He has OSA on CPAP, also with obesity hypoventilation syndrome and followed by neurology; he endorses 100% compliance with restorative sleep. He has xanax  script for anxiety but rare use, last filled 30 tabs 01/02/2020.   BMI is Body mass index is 31.46 kg/m., he  does somewhat watch his diet Wt Readings from Last 3 Encounters:  02/18/22 245 lb (111.1 kg)  01/08/22 246 lb (111.6 kg)  10/23/21 253 lb (114.8 kg)   He has known ascending aorta dilation, It was 4.2 cm in 2016, 4.1 cm per recent ECHO 11/2017 by cardiology, plan for follow up in 2 years. He had repeat stress test and ECHO in 11/2017 which were essentially normal. Follows with [name].  His blood pressure has been controlled at home, today their BP is BP: 136/78 He does not workout. He denies chest pain, shortness of breath, dizziness.   He is on cholesterol medication (atorvastatin 40 mg daily) and denies myalgias. His cholesterol is at goal. The cholesterol last visit was:   Lab Results  Component Value Date   CHOL 152 10/23/2021   HDL 49 10/23/2021   LDLCALC 75 10/23/2021   TRIG 188 (H) 10/23/2021   CHOLHDL 3.1 10/23/2021   He has been working on diet for LADA managed as T2 diabetes, now has freestyle [place], followed by [name], he is on bASA, he is on ACE/ARB and denies foot ulcerations, increased appetite, nausea, paresthesia of the feet, polydipsia, polyuria, visual disturbances, vomiting and weight loss.   Last A1C in the office was:  Lab Results  Component Value Date   HGBA1C 7.4 (H) 10/23/2021   He has CKD II associated with T2DM. Last GFR: Lab Results  Component Value Date   GFRNONAA >60 12/27/2020    Patient is on Vitamin D supplement, taking 5000 IU daily  Lab Results  Component Value Date   VD25OH 61 10/23/2021     Last PSA was: Lab Results  Component Value Date   PSA 4.1 (H) 09/21/2018   PSA 3.2 08/04/2017   PSA 3.0 06/02/2016  He was referred to [facility].  He endorses slow stream occasionally if he gets up late at night to urinate; typically 1-2 times in the evening.    Current Medications:  Current Outpatient Medications  on File Prior to Visit  Medication Sig Dispense Refill   ALPRAZolam (XANAX) 1 MG tablet Take     1/2 to 1 tablet        2 to 3 x /day          as needed for Anxiety attack 30 tablet 0   aspirin 81 MG tablet Take 81 mg by mouth daily.     atorvastatin (LIPITOR) 40 MG tablet Take 1 tablet (40 mg total) by mouth daily. for cholesterol 90 tablet 3   BD INSULIN SYRINGE U/F 31G X 5/16" 0.5 ML MISC INJECT INSULIN TWICE A DAY AS DIRECTED 200 each 3   Cholecalciferol (VITAMIN D) 125 MCG (5000 UT) CAPS Take 10,000 Units by mouth daily. 60 capsule 0   Continuous Blood Gluc Sensor (DEXCOM G7 SENSOR) MISC 3 each by Does not apply route every 30 (thirty) days. Apply 1 sensor every 10 days 9 each 1   Cyanocobalamin (B-12) 2500 MCG SUBL Place under the tongue daily.     finasteride (PROSCAR) 5 MG tablet Take 5 mg by mouth daily.     glucose 4 GM chewable tablet Chew 1 tablet (4 g total) by mouth as needed for low blood sugar. 30 tablet 12   glucose blood (FREESTYLE TEST STRIPS) test strip Use 1- 2 times a day with the freestyle libre 14.  CGM glucometer 150 each 3   injection device for insulin (CEQUR SIMPLICITY 2U) DEVI 1 each by Other route  every 3 (three) days. 30 each 1   Injection Device for Insulin (CEQUR SIMPLICITY INSERTER) MISC Use as advised 1 each 0   insulin glargine, 2 Unit Dial, (TOUJEO MAX SOLOSTAR) 300 UNIT/ML Solostar Pen Inject 44-50 Units into the skin daily. 15 mL 2   insulin lispro (HUMALOG) 100 UNIT/ML injection INJECT 10 UNITS TO 12 UNITS BEFORE BREAKFAST AND 15 UNITS TO 20 UNITS BEFORE LUNCH AND DINNER 210 mL 3   Magnesium 500 MG TABS Take 1 tablet by mouth daily.     metFORMIN (GLUCOPHAGE-XR) 500 MG 24 hr tablet TAKE 2 TABLETS TWICE A DAY WITH MEALS FOR DIABETES 360 tablet 3   olmesartan-hydrochlorothiazide (BENICAR HCT) 40-25 MG tablet Take 1 tablet by mouth daily. 90 tablet 3   omeprazole (PRILOSEC) 20 MG capsule TAKE 1 CAPSULE DAILY FOR INDIGESTION AND HEARTBURN 90 capsule 3    potassium chloride SA (KLOR-CON M20) 20 MEQ tablet TAKE 1 TABLET TWICE A DAY FOR POTASSIUM 180 tablet 3   sildenafil (VIAGRA) 25 MG tablet Take 25 mg by mouth daily as needed for erectile dysfunction.     tamsulosin (FLOMAX) 0.4 MG CAPS capsule Take 0.4 mg by mouth daily.     tirzepatide [facility]) 5 MG/0.5ML Pen Inject 5 mg into the skin once a week. 6 mL 3   No current facility-administered medications on file prior to visit.   Allergies:  Allergies  Allergen Reactions   Crestor [Rosuvastatin] Cough   Health Maintenance:  Immunization History  Administered Date(s) Administered   Fluad Quad(high Dose 65+) 12/14/2020   Influenza Inj Mdck Quad With Preservative 12/19/2016   Influenza Split 11/28/2013, 12/06/2014   Influenza, High Dose Seasonal PF 12/23/2019   Influenza,inj,Quad PF,6+ Mos 12/23/2017   Influenza-Unspecified 12/19/2011, 01/04/2013, 01/01/2016, 12/23/2017   Moderna Covid-19 Vaccine Bivalent Booster 29yr & up 03/18/2021   Moderna SARS-COV2 Booster Vaccination 07/24/2020   Moderna Sars-Covid-2 Vaccination 04/04/2019, 05/03/2019, 12/23/2019   Pneumococcal Conjugate-13 09/29/2019   Pneumococcal Polysaccharide-23 03/03/2006, 10/05/2020   Td 03/03/2008, 03/16/2018   Zoster, Live 02/07/2011  Patient Care Team: MUnk Pinto MD as PCP - General (Internal Medicine) SRutherford Guys MD as Consulting Physician (Ophthalmology) BRonald Lobo MD as Consulting Physician (Gastroenterology) MLarey Dresser MD as Consulting Physician (Cardiology) RSkeet Latch MD as Attending Physician (Cardiology) AJanann August MD as Referring Physician (Dermatology)  Medical History:  has Ventral hernia - periumbilical 26-2UQ Morbid obesity ([place] - BMI 30+ with OSA; Hyperlipidemia associated with type 2 diabetes mellitus ([place]; Essential hypertension; Vitamin D deficiency; GERD (gastroesophageal reflux disease); CKD stage 2 due to type 2 diabetes mellitus ([place]; Obesity  hypoventilation syndrome (HCC); BPH (benign prostatic hyperplasia); Erectile dysfunction associated with type 2 diabetes mellitus ([place]; PVC's (premature ventricular contractions); Aneurysm, ascending aorta (HCC); LADA (latent autoimmune diabetes in adults), managed as type 2 ([place]; Family history of cerebrovascular disease; and OSA on CPAP on their problem list. Surgical History:  He  has a past surgical history that includes Anal fissure repair (2007); Ventral hernia repair (04/13/12); and Tonsillectomy (Bilateral). Family History:  His family history includes Atrial fibrillation in his mother; Cancer in his paternal grandfather; Cancer (age of onset: 510 in his paternal uncle; Dementia in his paternal grandmother; Kidney cancer (age of onset: 553 in his father; Stroke in his maternal grandmother. Social History:   reports that he has never smoked. He has never used smokeless tobacco. He reports current alcohol use of about 3.0 - 5.0 standard drinks of alcohol per week. He reports that he does not use  drugs. Review of Systems:  Review of Systems  Constitutional:  Negative for malaise/fatigue and weight loss.  HENT:  Positive for congestion. Negative for ear pain, hearing loss, sinus pain, sore throat and tinnitus.   Eyes:  Negative for blurred vision and double vision.  Respiratory:  Positive for cough (intermittent, clear secretions). Negative for sputum production, shortness of breath and wheezing.   Cardiovascular:  Negative for chest pain, palpitations, orthopnea, claudication, leg swelling and PND.  Gastrointestinal:  Negative for abdominal pain, blood in stool, constipation, diarrhea, heartburn, melena, nausea and vomiting.  Genitourinary: Negative.   Musculoskeletal:  Negative for falls, joint pain and myalgias.  Skin:  Negative for rash.  Neurological:  Negative for dizziness, tingling, sensory change, weakness and headaches.  Endo/Heme/Allergies:  Negative for polydipsia.   Psychiatric/Behavioral: Negative.  Negative for depression, memory loss, substance abuse and suicidal ideas. The patient is not nervous/anxious and does not have insomnia.   All other systems reviewed and are negative.   Physical Exam: Estimated body mass index is 31.46 kg/m as calculated from the following:   Height as of this encounter: _0  (1.88 m).   Weight as of this encounter: 245 lb (111.1 kg). BP 136/78   Pulse 69   Temp (!) 97.1 F (36.2 C)   Ht _1  (1.88 m)   Wt 245 lb (111.1 kg)   SpO2 99%   BMI 31.46 kg/m  General Appearance: Well nourished, in no apparent distress.  Eyes: PERRLA, EOMs, conjunctiva no swelling or erythema Sinuses: No Frontal/maxillary tenderness  ENT/Mouth: Ext aud canals clear, normal light reflex with TMs without erythema, bulging. Good dentition. No erythema, swelling, or exudate on post pharynx. Tonsils not swollen or erythematous. Hearing normal.  Neck: Supple, thyroid normal. No bruits  Respiratory: Respiratory effort normal, BS equal bilaterally without rales, rhonchi, wheezing or stridor.  Cardio: RRR without murmurs, rubs or gallops. Brisk peripheral pulses without edema.  Chest: symmetric, with normal excursions and percussion.  Abdomen: Soft, obese abdomen, nontender, no guarding, rebound, masses, or organomegaly. He has easily reduced ventral hernia.  Lymphatics: Non tender without lymphadenopathy.  Genitourinary: defer to urology Musculoskeletal: Full ROM all peripheral extremities,5/5 strength, and normal gait.  Skin: Warm, dry without rashes, ecchymosis. He has dark brown, raised 4 mm x 12 mm lesion to R mid back with crusting, irregular borders and coloring to R mid back (has seen derm and cleared). Scabbed lesion to bridge of nose.  Neuro: Cranial nerves intact, reflexes equal bilaterally. Normal muscle tone, no cerebellar symptoms. Sensation intact.  Psych: Awake and oriented X 3, normal affect, Insight and Judgment appropriate.    Saraya Tirey 4:33 PM [facility]

## 2022-02-21 ENCOUNTER — Encounter: Payer: Self-pay | Admitting: Internal Medicine

## 2022-02-21 ENCOUNTER — Ambulatory Visit (INDEPENDENT_AMBULATORY_CARE_PROVIDER_SITE_OTHER): Payer: Self-pay | Admitting: Internal Medicine

## 2022-02-21 VITALS — BP 112/66 | HR 73 | Ht 74.0 in | Wt 244.8 lb

## 2022-02-21 DIAGNOSIS — E669 Obesity, unspecified: Secondary | ICD-10-CM

## 2022-02-21 DIAGNOSIS — E785 Hyperlipidemia, unspecified: Secondary | ICD-10-CM

## 2022-02-21 DIAGNOSIS — E1169 Type 2 diabetes mellitus with other specified complication: Secondary | ICD-10-CM

## 2022-02-21 DIAGNOSIS — E139 Other specified diabetes mellitus without complications: Secondary | ICD-10-CM

## 2022-02-21 LAB — POCT GLYCOSYLATED HEMOGLOBIN (HGB A1C): Hemoglobin A1C: 6 % — AB (ref 4.0–5.6)

## 2022-02-21 MED ORDER — INSULIN LISPRO 100 UNIT/ML IJ SOLN: INTRAMUSCULAR | 3 refills | Status: DC

## 2022-02-21 NOTE — Patient Instructions (Addendum)
Please continue: - Metformin ER 500 mg in am and 500 mg at night - Mounjaro 5 mg weekly - Tresiba 44 units daily - Humalog - 15 min before meals:  5 units in am 12-20 units before lunch 12-20 units before dinner Change the Sliding scale of Humalog: - 141-160: + 2 units  - 161-180: + 3 units  - 181-200: + 4 units  - 201-240: + 5 units  - 241-280: + 6 units  - 281-320: + 7 units  - 321-360: + 8 units  - >361: + 10 units  Please return in 4 months.

## 2022-02-21 NOTE — Progress Notes (Signed)
Patient ID: SENDER RUEB, male   DOB: Jul 23, 1955, 66 y.o.   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 Last visit 4 months ago. 10/01/2021: changed Insurance from [employer] to [name].  Interim history: No increased urination, blurry vision, nausea, chest pain.   Reviewed HbA1c levels: Lab Results  Component Value Date   HGBA1C 7.4 (H) 10/23/2021   HGBA1C 7.7 (A) 10/18/2021   HGBA1C 7.3 (A) 04/19/2021   He is on: - Metformin ER 1000 >> 500 mg in am and 500 mg at night -decreased 2/2 diarrhea - Trulicity 1.5 >> 3 mg weekly >> Mounjaro 5 mg daily - Tresiba 44 >> 48 >> 42-44 >> 46 >> 44 units at bedtime - Humalog - 15 min before meals: in the Cequr pump - he likes it  5 units in am 12-15 units before lunch 12-18 units before dinner Correction at bedtime:4-5 units Sliding scale: - 141-160: + 2 units  - 161-180: + 3 units  - 181-200: + 4 units  - 201-240: + 5 units  - 241-280: + 6 units  - 281-320: + 7 units  - 321-360: + 8 units  - >361: + 10 units He had burning at the inj site with Lyumjev. Previously on Lantus.  He checks his sugars more than 4 times a day with his freestyle libre CGM:  Previously:   Previously:   Lowest sugar was 30s ... >> 50 >> 48 >> 52; he has hypoglycemia awareness in the 60s. Highest sugar was in the HI >> 300s >> 252.  Glucometer: one touch ultra 2  Pt's meals are: - Breakfast: Glucerna, banana >> oatmeal >> English muffin or boiled egg or oatmeal - Lunch: eats out - salad with chicken; chicken burrito; sandwich; chick-fil-a >> sandwich with either tomatoes or peanut butter and jelly + hold milk - Dinner: meat + veggie + starch - Snacks: potato chips, fruit, icecream No sodas.  -+ Mild CKD, last BUN/creatinine:  Lab Results  Component Value Date   BUN 18  01/08/2022   BUN 16 10/23/2021   CREATININE 0.85 01/08/2022   CREATININE 1.12 10/23/2021  On losartan.  -+ HL; last set of lipids: Lab Results  Component Value Date   CHOL 152 10/23/2021   HDL 49 10/23/2021   LDLCALC 75 10/23/2021   TRIG 188 (H) 10/23/2021   CHOLHDL 3.1 10/23/2021  On Lipitor 40.  - last eye exam was in 11/2020: No DR  -No numbness and tingling in his feet.  Foot exam 10/18/2021.  He also has a history of HTN, GERD, OSA-on CPAP.  Of note, his wife passed away with cancer in 2018/05/15.  ROS: + See HPI  I reviewed pt's medications, allergies, PMH, social hx, family hx, and changes were documented in the history of present illness. Otherwise, unchanged from my initial visit note.  Past Medical History:  Diagnosis Date   Diabetes mellitus without complication ([place]) 2595   insulin requiring   Elevated PSA 09/22/2018   [facility] is following    GERD (gastroesophageal reflux disease)    Hyperlipidemia    Hypertension    Hypokalemia    Hypomagnesemia    OSA on CPAP    10-12 years ago   Vitamin D deficiency    Past Surgical History:  Procedure Laterality Date  ANAL FISSURE REPAIR  2007   TONSILLECTOMY Bilateral    As a child   VENTRAL HERNIA REPAIR  04/13/12   Dr. Johney Maine, ventral hernia repair w/ mesh   Social History   Social History   Marital status: Married    Spouse name: N/A   Number of children: 2   Occupational History   estimator   Social History Main Topics   Smoking status: Never Smoker   Smokeless tobacco: Not on file   Alcohol use 0.0 oz/week    2 - 5 Cans of beer per week   Current Outpatient Medications on File Prior to Visit  Medication Sig Dispense Refill   ALPRAZolam (XANAX) 1 MG tablet Take     1/2 to 1 tablet        2 to 3 x /day          as needed for Anxiety attack 30 tablet 0   aspirin 81 MG tablet Take 81 mg by mouth daily.     atorvastatin (LIPITOR) 40 MG tablet Take 1 tablet (40 mg total) by mouth daily. for  cholesterol 90 tablet 3   BD INSULIN SYRINGE U/F 31G X 5/16" 0.5 ML MISC INJECT INSULIN TWICE A DAY AS DIRECTED 200 each 3   Cholecalciferol (VITAMIN D) 125 MCG (5000 UT) CAPS Take 10,000 Units by mouth daily. 60 capsule 0   Continuous Blood Gluc Sensor (DEXCOM G7 SENSOR) MISC 3 each by Does not apply route every 30 (thirty) days. Apply 1 sensor every 10 days 9 each 1   Cyanocobalamin (B-12) 2500 MCG SUBL Place under the tongue daily.     finasteride (PROSCAR) 5 MG tablet Take 5 mg by mouth daily.     glucose 4 GM chewable tablet Chew 1 tablet (4 g total) by mouth as needed for low blood sugar. 30 tablet 12   glucose blood (FREESTYLE TEST STRIPS) test strip Use 1- 2 times a day with the freestyle libre 14.  CGM glucometer 150 each 3   injection device for insulin (CEQUR SIMPLICITY 2U) DEVI 1 each by Other route every 3 (three) days. 30 each 1   Injection Device for Insulin (CEQUR SIMPLICITY INSERTER) MISC Use as advised 1 each 0   insulin glargine, 2 Unit Dial, (TOUJEO MAX SOLOSTAR) 300 UNIT/ML Solostar Pen Inject 44-50 Units into the skin daily. 15 mL 2   insulin lispro (HUMALOG) 100 UNIT/ML injection INJECT 10 UNITS TO 12 UNITS BEFORE BREAKFAST AND 15 UNITS TO 20 UNITS BEFORE LUNCH AND DINNER 210 mL 3   Magnesium 500 MG TABS Take 1 tablet by mouth daily.     metFORMIN (GLUCOPHAGE-XR) 500 MG 24 hr tablet TAKE 2 TABLETS TWICE A DAY WITH MEALS FOR DIABETES 360 tablet 3   olmesartan-hydrochlorothiazide (BENICAR HCT) 40-25 MG tablet Take 1 tablet by mouth daily. 90 tablet 3   omeprazole (PRILOSEC) 20 MG capsule TAKE 1 CAPSULE DAILY FOR INDIGESTION AND HEARTBURN 90 capsule 3   potassium chloride SA (KLOR-CON M20) 20 MEQ tablet TAKE 1 TABLET TWICE A DAY FOR POTASSIUM 180 tablet 3   sildenafil (VIAGRA) 25 MG tablet Take 25 mg by mouth daily as needed for erectile dysfunction.     tamsulosin (FLOMAX) 0.4 MG CAPS capsule Take 0.4 mg by mouth daily.     tirzepatide [facility]) 5 MG/0.5ML Pen Inject 5 mg  into the skin once a week. 6 mL 3   No current facility-administered medications on file prior to visit.   Allergies  Allergen  Reactions   Crestor [Rosuvastatin] Cough   Family History  Problem Relation Age of Onset   Atrial fibrillation Mother    Kidney cancer Father 73       kidney   Stroke Maternal Grandmother    Dementia Paternal Grandmother        Early 34s   Cancer Paternal Grandfather        [name] of age or type   Cancer Paternal Uncle 65    PE: BP 112/66 (BP Location: Right Arm, Patient Position: Sitting, Cuff Size: Normal)   Pulse 73   Ht '6\' 2"'$  (1.88 m)   Wt 244 lb 12.8 oz (111 kg)   SpO2 99%   BMI 31.43 kg/m   Wt Readings from Last 3 Encounters:  02/21/22 244 lb 12.8 oz (111 kg)  02/18/22 245 lb (111.1 kg)  01/08/22 246 lb (111.6 kg)   Constitutional: overweight, in NAD Eyes: no exophthalmos ENT: no masses palpated in neck, no cervical lymphadenopathy Cardiovascular: RRR, No MRG Respiratory: CTA B Musculoskeletal: no deformities Skin: no rashes Neurological: no tremor with outstretched hands  ASSESSMENT: 1. LADA, insulin-dependent, uncontrolled, with complications - CKD stage 2 - ED  - he has an elliptical machine at home  Component     Latest Ref Rng & Units 07/11/2016  Hemoglobin A1C      6.5  Glutamic Acid Decarb Ab     <5 IU/mL 47 (H)  Pancreatic Islet Cell Antibody     <5 JDF Units <5  C-Peptide     0.80 - 3.85 ng/mL 0.37 (L)  Glucose, Fasting     65 - 99 mg/dL 104 (H)   Elevated GAD antibodies and decreased insulin production. 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 HL  3.  Obesity class I BMI Classification: < 18.5 underweight  18.5-24.9 normal weight  25.0-29.9 overweight  30.0-34.9 class I obesity  35.0-39.9 class II obesity  ? 40.0 class III obesity   PLAN:  1. Patient with longstanding, uncontrolled, insulin-dependent diabetes  (LADA).  At today's visit, he is on metformin, basal/bolus insulin regimen, but since last visit he was able to switch from Trulicity to [place].  Also, he was able to start the CeQur simplicity insulin pump, which he likes.  He mentions that this allows him to bolus appropriately for meals and also for correction during the day. CGM interpretation: -At today's visit, we reviewed his CGM downloads: It appears that 76% of values are in target range (goal >70%), while 21% are higher than 180 (goal <25%), and 3% are lower than 70 (goal <4%).  The calculated average blood sugar is 145.  The projected HbA1c for the next 3 months (GMI) is 6.8%. -Reviewing the CGM trends, sugars are mostly fluctuating within the target range, with occasional high blood sugars especially after dinner.  2 weeks prior to the last 2 weeks, sugars were even better controlled, so I am assuming that the sugars are slightly higher during the holidays.  The low blood sugars that he occasionally gets are usually after lunch and I suspect that this may be due to overcorrection of higher blood sugars.  He does not follow the sliding scale that I gave him, but uses a more strict sliding scale.  We discussed about trying not to overcorrect high blood sugars but we also discussed that whenever he tries to correct the high or low blood  sugar, he needs to follow the sugars with the glucometer, rather than the sensor, since the sensor values are lacking 15 to 20 minutes behind the blood values.  He agrees to do so.  Otherwise, we will continue the current regimen. - I advised him to:  Patient Instructions  Please continue: - Metformin ER 500 mg in am and 500 mg at night - Mounjaro 5 mg weekly - Tresiba 44 units daily - Humalog - 15 min before meals:  5 units in am 12-20 units before lunch 12-20 units before dinner Change the Sliding scale of Humalog: - 141-160: + 2 units  - 161-180: + 3 units  - 181-200: + 4 units  - 201-240: + 5 units   - 241-280: + 6 units  - 281-320: + 7 units  - 321-360: + 8 units  - >361: + 10 units  Please return in 4 months.    - we checked his HbA1c: 6% (much better!) - advised to check sugars at different times of the day - 4x a day, rotating check times - advised for yearly eye exams >> he is not UTD - return to clinic in 3-4 months   2. HL -Reviewed latest lipid panel from 10/2021: LDL slightly higher than 70, triglycerides elevated: Lab Results  Component Value Date   CHOL 152 10/23/2021   HDL 49 10/23/2021   LDLCALC 75 10/23/2021   TRIG 188 (H) 10/23/2021   CHOLHDL 3.1 10/23/2021  -He is on Lipitor 40 mg daily without side effects  3.  Obesity class I -At last visit we tried to switch from Trulicity to [facility] for stronger effect -He did stop M&Ms before last visit -He lost 9 pounds since last visit   [name], MD PhD [facility]

## 2022-02-25 ENCOUNTER — Other Ambulatory Visit: Payer: Self-pay | Admitting: Nurse Practitioner

## 2022-02-27 ENCOUNTER — Other Ambulatory Visit: Payer: Self-pay

## 2022-02-27 DIAGNOSIS — E139 Other specified diabetes mellitus without complications: Secondary | ICD-10-CM

## 2022-02-27 MED ORDER — INSULIN LISPRO 100 UNIT/ML IJ SOLN: INTRAMUSCULAR | 2 refills | Status: DC

## 2022-03-18 ENCOUNTER — Encounter: Payer: Self-pay | Admitting: Internal Medicine

## 2022-03-18 LAB — HM DIABETES EYE EXAM

## 2022-03-26 ENCOUNTER — Telehealth: Payer: Self-pay | Admitting: Pharmacy Technician

## 2022-03-26 ENCOUNTER — Other Ambulatory Visit (HOSPITAL_COMMUNITY): Payer: Self-pay

## 2022-03-26 ENCOUNTER — Encounter: Payer: Self-pay | Admitting: Internal Medicine

## 2022-03-26 DIAGNOSIS — E139 Other specified diabetes mellitus without complications: Secondary | ICD-10-CM

## 2022-03-26 MED ORDER — INSULIN LISPRO 100 UNIT/ML IJ SOLN: INTRAMUSCULAR | 2 refills | Status: DC

## 2022-03-26 NOTE — Telephone Encounter (Signed)
-----  Message from Lauralyn Primes, [place] sent at 03/20/2022 10:06 AM EST ----- Regarding: PA Good morning  Can we get a PA for Humalog. Thank you.

## 2022-03-26 NOTE — Telephone Encounter (Signed)
Pharmacy Patient Advocate Encounter   Received notification from RMA/Staff msg that prior authorization for Humalog is required/requested.  Per test claim: Pt got generic Humalog (Insulin Lispro) on 03/17/22 for a 20 day supply.  Since he can get the generic, is this ok, or would you still prefer for [place] to do the PA for Humalog?  I see he sent a msg today that sounds like he's okay with the change.

## 2022-03-26 NOTE — Telephone Encounter (Signed)
Rx sent 

## 2022-04-03 ENCOUNTER — Encounter: Payer: Self-pay | Admitting: Neurology

## 2022-04-03 ENCOUNTER — Ambulatory Visit (INDEPENDENT_AMBULATORY_CARE_PROVIDER_SITE_OTHER): Payer: 59 | Admitting: Neurology

## 2022-04-03 VITALS — BP 119/75 | HR 79 | Ht 74.0 in | Wt 245.8 lb

## 2022-04-03 DIAGNOSIS — N182 Chronic kidney disease, stage 2 (mild): Secondary | ICD-10-CM

## 2022-04-03 DIAGNOSIS — K439 Ventral hernia without obstruction or gangrene: Secondary | ICD-10-CM | POA: Diagnosis not present

## 2022-04-03 DIAGNOSIS — E1122 Type 2 diabetes mellitus with diabetic chronic kidney disease: Secondary | ICD-10-CM

## 2022-04-03 DIAGNOSIS — G4733 Obstructive sleep apnea (adult) (pediatric): Secondary | ICD-10-CM | POA: Diagnosis not present

## 2022-04-03 DIAGNOSIS — E139 Other specified diabetes mellitus without complications: Secondary | ICD-10-CM

## 2022-04-03 NOTE — Patient Instructions (Signed)

## 2022-04-03 NOTE — Progress Notes (Signed)
SLEEP MEDICINE CLINIC    Provider:  Larey Seat, MD  Primary Care Physician:  Unk Pinto, MD [address] [place] [address]     Referring Provider: Unk Pinto, [address] [place],  [place] [address]          Chief Complaint according to patient   Patient presents with:     New Patient (Initial Visit)           HISTORY OF PRESENT ILLNESS:  04-03-2022:  Harry [name] is a 67 y.o. Caucasian male patient seen here as a re-referral on 04/03/2022 from Dr Melford Aase for a follow up on OSA. He was last seen at the onset of the pandemic 04-2018 by Cecille Rubin, NP for CPAP .  Chief concern according to patient :  I may need follow up to get supplies, and next year I am due for a new one.     Sandy Salaam Motter   has a past medical history of Diabetes mellitus without complication ([place]) (9147), Elevated PSA (09/22/2018), GERD (gastroesophageal reflux disease), Hyperlipidemia, Hypertension, Hypokalemia, Hypomagnesemia, OSA on CPAP, and Vitamin D deficiency.Marland Kitchen He was diagnosed with stage II CKD with a great glomerular filtration rate of 76 mL/min is followed for diabetes by [name], he has a history of essential hypertension continues on olmesartan hydrochlorothiazide.  Dr. Janit Pagan emphasized the DASH diet.  He is hyperlipidemic on atorvastatin currently.  In the past of vitamin D deficiency had been mentioned he continues to take vitamin D supplements.  He has gastroesophageal reflux disease with esophagitis but no Barrett strictures.  Ascending aortic aneurysm has been noted treatment by monitoring and 5 good blood pressure control.  His HbA1c is running around 6.6.  His body mass index is currently 31.5.  Triglycerides are elevated, he takes magnesium supplements which also helps restless legs and neuropathy a little bit.  His last visit in our clinic was on 2-18 2020 was Gilford Raid who stated that he had excellent compliance data is  compliance was 100% for days and time his residual AHI was 1.7/h and at that time his Epworth sleepiness score had been reduced to 3 out of 24 points.  Compliance report now shows again 100% compliance for 30 out of 30 days over 4 hours each night with an average use time of 9 hours 27 minutes.  The machine is set between 5 and 12 cmH2O pressure was 1 expiratory pressure relief.  His residual AHI was 1.5/h which is excellent.  He does not have high pressure needs the 95th percentile is 10 cm water air leaks of very minimal he has switched to a nasal pillow which works fine for him.  His machine will be 68 years old as of December of this year he was set up on 03/02/2018.   The patient had a sleep study in the year 2019, SPLIT study 01-18-2018.   with a result of an AHI ( Apnea Hypopnea index)  of 69/h, and was titrated to  CPAP the same night, under a mirage fx large- to 11 cm water.      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 He lost his wife to cancer 04-22-2018, during the pandemic.  Non smoker, drinks alcohol 2 beers or 1 wine in PM.      Review of Systems: Out of a complete 14 system review, the patient complains of only  the following symptoms, and all other reviewed systems are negative.:  Fatigue, sleepiness , snoring, fragmented sleep,    How likely are you to doze in the following situations: 0 = not likely, 1 = slight chance, 2 = moderate chance, 3 = high chance   Sitting and Reading? Watching Television? Sitting inactive in a public place (theater or meeting)? As a passenger in a car for an hour without a break? Lying down in the afternoon when circumstances permit? Sitting and talking to someone? Sitting quietly after lunch without alcohol? In a car, while stopped for a few minutes in traffic?   Total = 4/ 24 points   FSS endorsed at 41/ 63 points.   GDS 3/ 15 points.   Social History   Socioeconomic History    Marital status: Widowed    Spouse name: Not on file   Number of children: 2   Years of education: Not on file   Highest education level: Not on file  Occupational History   Not on file  Tobacco Use   Smoking status: Never   Smokeless tobacco: Never  Vaping Use   Vaping Use: Never used  Substance and Sexual Activity   Alcohol use: Yes    Alcohol/week: 3.0 - 5.0 standard drinks of alcohol    Types: 3 - 5 Standard drinks or equivalent per week    Comment: weekly   Drug use: No   Sexual activity: Yes    Partners: Female    Birth control/protection: Post-menopausal  Other Topics Concern   Not on file  Social History Narrative   Not on file   Social Determinants of Health   Financial Resource Strain: Not on file  Food Insecurity: Not on file  Transportation Needs: Not on file  Physical Activity: Insufficiently Active (08/04/2017)   Exercise Vital Sign    Days of Exercise per Week: 1 day    Minutes of Exercise per Session: 50 min  Stress: No Stress Concern Present (09/21/2018)   [place]    Feeling of Stress : Only a little  Social Connections: Not on file    Family History  Problem Relation Age of Onset   Atrial fibrillation Mother    Kidney cancer Father 59       kidney   Stroke Maternal Grandmother    Dementia Paternal Grandmother        Early 60s   Cancer Paternal Grandfather        Unsure of age or type   Cancer Paternal Uncle 26    Past Medical History:  Diagnosis Date   Diabetes mellitus without complication ([place]) 3500   insulin requiring   Elevated PSA 09/22/2018   [facility] is following    GERD (gastroesophageal reflux disease)    Hyperlipidemia    Hypertension    Hypokalemia    Hypomagnesemia    OSA on CPAP    10-12 years ago   Vitamin D deficiency     Past Surgical History:  Procedure Laterality Date   ANAL FISSURE REPAIR  2007   TONSILLECTOMY Bilateral    As a child    VENTRAL HERNIA REPAIR  04/13/12   Dr. Johney Maine, ventral hernia repair w/ mesh     Current Outpatient Medications on File Prior to Visit  Medication Sig Dispense Refill   ALPRAZolam (XANAX) 1 MG tablet Take     1/2 to 1 tablet        2 to  3 x /day          as needed for Anxiety attack 30 tablet 0   aspirin 81 MG tablet Take 81 mg by mouth daily.     atorvastatin (LIPITOR) 40 MG tablet Take 1 tablet (40 mg total) by mouth daily. for cholesterol 90 tablet 3   BD INSULIN SYRINGE U/F 31G X 5/16" 0.5 ML MISC INJECT INSULIN TWICE A DAY AS DIRECTED 200 each 3   Cholecalciferol (VITAMIN D) 125 MCG (5000 UT) CAPS Take 10,000 Units by mouth daily. 60 capsule 0   Continuous Blood Gluc Sensor (DEXCOM G7 SENSOR) MISC 3 each by Does not apply route every 30 (thirty) days. Apply 1 sensor every 10 days 9 each 1   Cyanocobalamin (B-12) 2500 MCG SUBL Place under the tongue daily.     finasteride (PROSCAR) 5 MG tablet Take 5 mg by mouth daily.     glucose 4 GM chewable tablet Chew 1 tablet (4 g total) by mouth as needed for low blood sugar. 30 tablet 12   glucose blood (FREESTYLE TEST STRIPS) test strip Use 1- 2 times a day with the freestyle libre 14.  CGM glucometer 150 each 3   injection device for insulin (CEQUR SIMPLICITY 2U) DEVI 1 each by Other route every 3 (three) days. 30 each 1   Injection Device for Insulin (CEQUR SIMPLICITY INSERTER) MISC Use as advised 1 each 0   insulin glargine, 2 Unit Dial, (TOUJEO MAX SOLOSTAR) 300 UNIT/ML Solostar Pen Inject 44-50 Units into the skin daily. 15 mL 2   Magnesium 500 MG TABS Take 1 tablet by mouth daily.     metFORMIN (GLUCOPHAGE-XR) 500 MG 24 hr tablet TAKE 2 TABLETS TWICE A DAY WITH MEALS FOR DIABETES (Patient taking differently: Take 500 mg by mouth 2 (two) times daily with a meal. TAKE 2 TABLETS TWICE A DAY WITH MEALS FOR DIABETES) 360 tablet 3   olmesartan-hydrochlorothiazide (BENICAR HCT) 40-25 MG tablet Take 1 tablet by mouth daily. 90 tablet 3   omeprazole  (PRILOSEC) 20 MG capsule TAKE 1 CAPSULE DAILY FOR INDIGESTION AND HEARTBURN 90 capsule 3   potassium chloride SA (KLOR-CON M20) 20 MEQ tablet TAKE 1 TABLET TWICE A DAY FOR POTASSIUM 180 tablet 3   sildenafil (VIAGRA) 25 MG tablet Take 25 mg by mouth daily as needed for erectile dysfunction.     tamsulosin (FLOMAX) 0.4 MG CAPS capsule Take 0.4 mg by mouth daily.     tirzepatide [facility]) 5 MG/0.5ML Pen Inject 5 mg into the skin once a week. 6 mL 3   insulin lispro (HUMALOG) 100 UNIT/ML injection INJECT under skin up to 50 units a day as advised (Patient not taking: Reported on 04/03/2022) 50 mL 2   No current facility-administered medications on file prior to visit.    Allergies  Allergen Reactions   Crestor [Rosuvastatin] Cough    Physical exam:  Today's Vitals   04/03/22 1301  BP: 119/75  Pulse: 79  Weight: 245 lb 12.8 oz (111.5 kg)  Height: '6\' 2"'$  (1.88 m)   Body mass index is 31.56 kg/m.   Wt Readings from Last 3 Encounters:  04/03/22 245 lb 12.8 oz (111.5 kg)  02/21/22 244 lb 12.8 oz (111 kg)  02/18/22 245 lb (111.1 kg)     Ht Readings from Last 3 Encounters:  04/03/22 '6\' 2"'$  (1.88 m)  02/21/22 '6\' 2"'$  (1.88 m)  02/18/22 '6\' 2"'$  (1.88 m)      General: The patient is awake, alert  and appears not in acute distress.  Head: Normocephalic, atraumatic. Neck is supple. Mallampati 2, unshaven. neck circumference:18 inches . Nasal airflow  patent.  Retrognathia is  seen.  Dental status: biological  Cardiovascular:  Regular rate and cardiac rhythm by pulse,  without distended neck veins. Respiratory: Lungs are clear to auscultation.  Skin: facial erythema,  Without evidence of ankle edema, or rash. Trunk: The patient's posture is erect.   Neurologic exam : The patient is awake and alert, oriented to place and time.   Memory subjective described as intact.  Attention span & concentration ability appears normal.  Speech is fluent,  without  dysarthria, dysphonia or aphasia.   Mood and affect are appropriate.   Cranial nerves: no loss of smell or taste reported  Pupils are equal and briskly reactive to light. Extraocular movements in vertical and horizontal planes were intact and without nystagmus. No Diplopia. Visual fields by finger perimetry are intact. Hearing was intact to soft voice and finger rubbing.    Facial sensation intact to fine touch.  Facial motor strength is symmetric and tongue and uvula move midline.  Neck ROM : rotation, tilt and flexion extension were normal for age and shoulder shrug was symmetrical.    Motor exam:  Symmetric bulk, tone and ROM.   Normal tone without cog -wheeling, symmetric grip strength .   Sensory:  Fine touch, pinprick and vibration were tested  and  normal.  Proprioception tested in the upper extremities was normal.   Coordination: Rapid alternating movements in the fingers/hands were of normal speed.  The Finger-to-nose maneuver was intact without evidence of ataxia or tremor.   Gait and station: no limp. Patient could rise unassisted from a seated position, walked without assistive device.  Stance is of normal width/ base and the patient turned with 3 steps.  Toe and heel walk were deferred.  Deep tendon reflexes: in the  upper and lower extremities are symmetric and intact.  Babinski response was deferred .       After spending a total time of  30  minutes face to face and additional time for physical and neurologic examination, review of laboratory studies,  personal review of imaging studies, reports and results of other testing and review of referral information / records as far as provided in visit, I have established the following assessments:  I got quickly reacquainted with this patient who got lost  to follow up  during the pandemic.   1) Mr. Hervey Wedig. Tremain is a meanwhile 67 year old compliant CPAP user for the last 4 years.  He was set up in December 2019 and will be receiving a new machine by the  end of this year.  In the meantime he will need his supplies and he has switched from a full facemask which was used during his split-night titration to a nasal pillow.  Given his excellent compliance there should be no problem to refill his supplies.  He also endorsed a low Epworth sleepiness score and a low degree of fatigue.   He had been on S9 AutoSet at the time we originally met and he was 100% compliant on this older machine as well.  His older machine had used a so clean ozone cleaning machine.     My Plan is to proceed with:  1)  I like to refill supplies, and I will in July order a HST for a baseline to then reorder the next machine for November before he retires.  I would like to thank Unk Pinto, MD and Unk Pinto, [address] [place],  [place] [address] for allowing me to meet with and to take care of this pleasant patient.   In short, NICANOR MENDOLIA is presenting with OSA on CPAP  I plan to follow up either personally or through our NP within 10 months.   CC: I will share my notes with PCP.Marland Kitchen  Electronically signed by: Larey Seat, MD 04/03/2022 1:15 PM  [facility] Board certified by The [employer] of Sleep Medicine and Diplomate of the [employer] of Sleep Medicine. Board certified In Neurology through the [place], Fellow of the [employer] of Neurology. Medical Director of [employer].

## 2022-04-11 ENCOUNTER — Other Ambulatory Visit: Payer: Self-pay

## 2022-04-11 ENCOUNTER — Encounter: Payer: Self-pay | Admitting: Internal Medicine

## 2022-04-11 DIAGNOSIS — E139 Other specified diabetes mellitus without complications: Secondary | ICD-10-CM

## 2022-04-11 MED ORDER — DEXCOM G7 SENSOR MISC: 3.0000 | 0 refills | Status: DC

## 2022-04-21 ENCOUNTER — Telehealth: Payer: Self-pay | Admitting: Neurology

## 2022-04-21 NOTE — Telephone Encounter (Signed)
HST- [employer] no auth req   Patient is scheduled at [facility] for 09/09/22 at 1 pm per MD request.  Mailed packet to the patient.

## 2022-04-28 ENCOUNTER — Other Ambulatory Visit: Payer: Self-pay

## 2022-04-28 ENCOUNTER — Ambulatory Visit
Admission: RE | Admit: 2022-04-28 | Discharge: 2022-04-28 | Disposition: A | Discharge status: Home / Self Care | Payer: 59 | Source: Ambulatory Visit | Attending: Nurse Practitioner | Admitting: Nurse Practitioner

## 2022-04-28 ENCOUNTER — Ambulatory Visit (INDEPENDENT_AMBULATORY_CARE_PROVIDER_SITE_OTHER): Payer: 59 | Admitting: Nurse Practitioner

## 2022-04-28 ENCOUNTER — Encounter: Payer: Self-pay | Admitting: Nurse Practitioner

## 2022-04-28 VITALS — BP 136/72 | HR 74 | Temp 97.5°F | Ht 74.0 in | Wt 243.0 lb

## 2022-04-28 DIAGNOSIS — R5383 Other fatigue: Secondary | ICD-10-CM | POA: Diagnosis not present

## 2022-04-28 DIAGNOSIS — J0100 Acute maxillary sinusitis, unspecified: Secondary | ICD-10-CM | POA: Diagnosis not present

## 2022-04-28 DIAGNOSIS — R0609 Other forms of dyspnea: Secondary | ICD-10-CM | POA: Diagnosis not present

## 2022-04-28 DIAGNOSIS — J069 Acute upper respiratory infection, unspecified: Secondary | ICD-10-CM

## 2022-04-28 DIAGNOSIS — M2560 Stiffness of unspecified joint, not elsewhere classified: Secondary | ICD-10-CM

## 2022-04-28 DIAGNOSIS — M25561 Pain in right knee: Secondary | ICD-10-CM

## 2022-04-28 MED ORDER — IPRATROPIUM-ALBUTEROL 0.5-2.5 (3) MG/3ML IN SOLN: 3.0000 mL | Freq: Once | RESPIRATORY_TRACT | Status: AC

## 2022-04-28 MED ORDER — AZITHROMYCIN 500 MG PO TABS: 500.0000 mg | ORAL_TABLET | Freq: Every day | ORAL | 0 refills | Status: AC

## 2022-04-28 NOTE — Patient Instructions (Addendum)
Upper Respiratory Infection, Adult An upper respiratory infection (URI) affects the nose, throat, and upper airways that lead to the lungs. The most common type of URI is often called the common cold. URIs usually get better on their own, without medical treatment. What are the causes? A URI is caused by a germ (virus). You may catch these germs by: Breathing in droplets from an infected person's cough or sneeze. Touching something that has the germ on it (is contaminated) and then touching your mouth, nose, or eyes. What increases the risk? You are more likely to get a URI if: You are very young or very old. You have close contact with others, such as at work, school, or a health care facility. You smoke. You have long-term (chronic) heart or lung disease. You have a weakened disease-fighting system (immune system). You have nasal allergies or asthma. You have a lot of stress. You have poor nutrition. What are the signs or symptoms? Runny or stuffy (congested) nose. Cough. Sneezing. Sore throat. Headache. Feeling tired (fatigue). Fever. Not wanting to eat as much as usual. Pain in your forehead, behind your eyes, and over your cheekbones (sinus pain). Muscle aches. Redness or irritation of the eyes. Pressure in the ears or face. How is this treated? URIs usually get better on their own within 7-10 days. Medicines cannot cure URIs, but your doctor may recommend certain medicines to help relieve symptoms, such as: Over-the-counter cold medicines. Medicines to reduce coughing (cough suppressants). Coughing is a type of defense against infection that helps to clear the nose, throat, windpipe, and lungs (respiratory system). Take these medicines only as told by your doctor. Medicines to lower your fever. Follow these instructions at home: Activity Rest as needed. If you have a fever, stay home from work or school until your fever is gone, or until your doctor says you may return to  work or school. You should stay home until you cannot spread the infection anymore (you are not contagious). Your doctor may have you wear a face mask so you have less risk of spreading the infection. Relieving symptoms Rinse your mouth often with salt water. To make salt water, dissolve -1 tsp (3-6 g) of salt in 1 cup (237 mL) of warm water. Use a cool-mist humidifier to add moisture to the air. This can help you breathe more easily. Eating and drinking  Drink enough fluid to keep your pee (urine) pale yellow. Eat soups and other clear broths. General instructions  Take over-the-counter and prescription medicines only as told by your doctor. Do not smoke or use any products that contain nicotine or tobacco. If you need help quitting, ask your doctor. Avoid being where people are smoking (avoid secondhand smoke). Stay up to date on all your shots (immunizations), and get the flu shot every year. Keep all follow-up visits. How to prevent the spread of infection to others  Wash your hands with soap and water for at least 20 seconds. If you cannot use soap and water, use hand sanitizer. Avoid touching your mouth, face, eyes, or nose. Cough or sneeze into a tissue or your sleeve or elbow. Do not cough or sneeze into your hand or into the air. Contact a doctor if: You are getting worse, not better. You have any of these: A fever or chills. Brown or red mucus in your nose. Yellow or brown fluid (discharge)coming from your nose. Pain in your face, especially when you bend forward. Swollen neck glands. Pain when you swallow.   White areas in the back of your throat. Get help right away if: You have shortness of breath that gets worse. You have very bad or constant: Headache. Ear pain. Pain in your forehead, behind your eyes, and over your cheekbones (sinus pain). Chest pain. You have long-lasting (chronic) lung disease along with any of these: Making high-pitched whistling sounds when  you breathe, most often when you breathe out (wheezing). Long-lasting cough (more than 14 days). Coughing up blood. A change in your usual mucus. You have a stiff neck. You have changes in your: Vision. Hearing. Thinking. Mood. These symptoms may be an emergency. Get help right away. Call 911. Do not wait to see if the symptoms will go away. Do not drive yourself to the hospital. Summary An upper respiratory infection (URI) is caused by a germ (virus). The most common type of URI is often called the common cold. URIs usually get better within 7-10 days. Take over-the-counter and prescription medicines only as told by your doctor. This information is not intended to replace advice given to you by your health care provider. Make sure you discuss any questions you have with your health care provider. Document Revised: 09/19/2020 Document Reviewed: 09/19/2020 Elsevier Patient Education  [place] for Routine Care of Injuries Many injuries can be cared for with rest, ice, compression, and elevation (RICE therapy). This includes: Resting the injured body part. Putting ice on the injury. Putting pressure (compression) on the injury. Raising the injured part (elevation). Using RICE therapy can help to lessen pain and swelling. Supplies needed: Ice. Plastic bag. Towel. Elastic bandage. Pillow or pillows to raise your injured body part. How to care for your injury with RICE therapy Rest Try to rest the injured part of your body. You can go back to your normal activities when your doctor says it is okay to do them and when you can do them without pain. If you rest the injury too much, it may not heal as well. Some injuries heal better with early movement instead of resting for too long. Ask your doctor if you should do exercises to help your injury get better. Ice  If told, put ice on the injured area. To do this: Put ice in a plastic bag. Place a towel  between your skin and the bag. Leave the ice on for 20 minutes, 2-3 times a day. Take off the ice if your skin turns bright red. This is very important. If you cannot feel pain, heat, or cold, you have a greater risk of damage to the area. Do not put ice on your bare skin. Use ice for as many days as your doctor tells you to use it. Compression Put pressure on the injured area. This can be done with an elastic bandage. If this type of bandage has been put on your injury: Follow instructions on the package the bandage came in about how to use it. Do not wrap the bandage too tightly. Wrap the bandage more loosely if part of your body beyond the bandage is blue, swollen, cold, painful, or loses feeling. Take off the bandage and put it on again every 3-4 hours or as told by your doctor. See your doctor if the bandage seems to make your problems worse.  Elevation Raise the injured area above the level of your heart while you are sitting or lying down. Follow these instructions at home: If your symptoms get worse or last a long time, make a  follow-up appointment with your doctor. You may need to have imaging tests, such as X-rays or an MRI. If you have imaging tests, ask how to get your results when they are ready. Return to your normal activities when your doctor says that it is safe. Keep all follow-up visits. Contact a doctor if: You keep having pain and swelling. Your symptoms get worse. Get help right away if: You have sudden, very bad pain at your injury or lower than your injury. You have redness or more swelling around your injury. You have tingling or numbness at your injury or lower than your injury, and it does not go away when you take off the bandage. Summary Many injuries can be cared for using rest, ice, compression, and elevation (RICE therapy). You can go back to your normal activities when your doctor says it is okay and when you can do them without pain. Put ice on the  injured area as told by your doctor. Get help if your symptoms get worse or if you keep having pain and swelling. This information is not intended to replace advice given to you by your health care provider. Make sure you discuss any questions you have with your health care provider. Document Revised: 12/08/2019 Document Reviewed: 12/08/2019 Elsevier Patient Education  [place].

## 2022-04-28 NOTE — Progress Notes (Signed)
Assessment and Plan:  Harry [name] was seen today for an episodic visit .  Diagnoses and all order for this visit:  Upper respiratory tract infection, unspecified type Given length of URI x3 weeks will assess for any underlying disease process  - DG Chest 2 View; Future  Acute non-recurrent maxillary sinusitis Stay well hydrated to keep mucus thin and productive. Duoneb administered - tolerated well Start Azithromycin as directed.  - azithromycin (ZITHROMAX) 500 MG tablet; Take 1 tablet (500 mg total) by mouth daily for 5 days.  Dispense: 5 tablet; Refill: 0 - ipratropium-albuterol (DUONEB) 0.5-2.5 (3) MG/3ML nebulizer solution 3 mL  DOE (dyspnea on exertion) Duoneb administered - tolerated well Start Azithromycin as directed.  - azithromycin (ZITHROMAX) 500 MG tablet; Take 1 tablet (500 mg total) by mouth daily for 5 days.  Dispense: 5 tablet; Refill: 0 - ipratropium-albuterol (DUONEB) 0.5-2.5 (3) MG/3ML nebulizer solution 3 mL  Other fatigue Rest  Acute pain of right knee RICE Method Continue to monitor  - DG Knee Complete 4 Views Left; Future  Limited joint range of motion (ROM)  - DG Knee Complete 4 Views Left; Future   Notify office for further evaluation and treatment, questions or concerns if any reported s/s fail to improve.   The patient was advised to call back or seek an in-person evaluation if any symptoms worsen or if the condition fails to improve as anticipated.   Further disposition pending results of labs. Discussed med's effects and SE's.    I discussed the assessment and treatment plan with the patient. The patient was provided an opportunity to ask questions and all were answered. The patient agreed with the plan and demonstrated an understanding of the instructions.  Discussed med's effects and SE's. Screening labs and tests as requested with regular follow-up as recommended.  I provided 25 minutes of face-to-face time during this encounter  including counseling, chart review, and critical decision making was preformed.   Future Appointments  Date Time Provider [place]  05/28/2022  4:00 PM Darrol Jump, NP [facility] None  06/23/2022  3:20 PM [name], MD [facility] None  09/09/2022  1:00 PM [facility] None  10/27/2022  3:00 PM Darrol Jump, NP [facility] None  01/08/2023  1:30 PM Dohmeier, Asencion Partridge, MD [facility] None    ------------------------------------------------------------------------------------------------------------------   HPI BP 136/72   Pulse 74   Temp (!) 97.5 F (36.4 C)   Ht '6\' 2"'$  (1.88 m)   Wt 243 lb (110.2 kg)   SpO2 97%   BMI 31.20 kg/m   Patient complains of symptoms of a URI, possible sinusitis. Symptoms include achiness, congestion, cough described as productive, facial pain, headache described as ache, nasal congestion, sinus pressure, and DOE . Onset of symptoms was 3 weeks ago, and has been unchanged since that time. Treatment to date:  Robitussin, Mucinex, Vicks Shower Steamer tablet  Denies fever, chills.  He is also concerned for ongoing left knee pain most notably on the inside of his knee x 8 weeks.  Denies any recent trauma, fall, injury.  He is having trouble walking d/t pain, unable to sleep at night due to pain.  Area is tender to palpation and radiates down to the posterior calf.  Denies heat, redness, swelling of the area.  Denies any past sports related injury, MVA.   Past Medical History:  Diagnosis Date   Diabetes mellitus without complication ([place]) [id number]   insulin requiring   Elevated PSA 09/22/2018   [facility]  is following    GERD (gastroesophageal reflux disease)    Hyperlipidemia    Hypertension    Hypokalemia    Hypomagnesemia    OSA on CPAP    10-12 years ago   Vitamin D deficiency      Allergies  Allergen Reactions   Crestor [Rosuvastatin] Cough    Current Outpatient Medications on File Prior to Visit  Medication  Sig   ALPRAZolam (XANAX) 1 MG tablet Take     1/2 to 1 tablet        2 to 3 x /day          as needed for Anxiety attack   aspirin 81 MG tablet Take 81 mg by mouth daily.   atorvastatin (LIPITOR) 40 MG tablet Take 1 tablet (40 mg total) by mouth daily. for cholesterol   BD INSULIN SYRINGE U/F 31G X 5/16" 0.5 ML MISC INJECT INSULIN TWICE A DAY AS DIRECTED   Cholecalciferol (VITAMIN D) 125 MCG (5000 UT) CAPS Take 10,000 Units by mouth daily.   Continuous Blood Gluc Sensor (DEXCOM G7 SENSOR) MISC 3 each by Does not apply route every 30 (thirty) days. Apply 1 sensor every 10 days   Cyanocobalamin (B-12) 2500 MCG SUBL Place under the tongue daily.   finasteride (PROSCAR) 5 MG tablet Take 5 mg by mouth daily.   glucose 4 GM chewable tablet Chew 1 tablet (4 g total) by mouth as needed for low blood sugar.   glucose blood (FREESTYLE TEST STRIPS) test strip Use 1- 2 times a day with the freestyle libre 14.  CGM glucometer   injection device for insulin (CEQUR SIMPLICITY 2U) DEVI 1 each by Other route every 3 (three) days.   Injection Device for Insulin (CEQUR SIMPLICITY INSERTER) MISC Use as advised   insulin glargine, 2 Unit Dial, (TOUJEO MAX SOLOSTAR) 300 UNIT/ML Solostar Pen Inject 44-50 Units into the skin daily.   insulin lispro (HUMALOG) 100 UNIT/ML injection INJECT under skin up to 50 units a day as advised   Magnesium 500 MG TABS Take 1 tablet by mouth daily.   metFORMIN (GLUCOPHAGE-XR) 500 MG 24 hr tablet TAKE 2 TABLETS TWICE A DAY WITH MEALS FOR DIABETES (Patient taking differently: Take 500 mg by mouth 2 (two) times daily with a meal. TAKE 2 TABLETS TWICE A DAY WITH MEALS FOR DIABETES)   olmesartan-hydrochlorothiazide (BENICAR HCT) 40-25 MG tablet Take 1 tablet by mouth daily.   omeprazole (PRILOSEC) 20 MG capsule TAKE 1 CAPSULE DAILY FOR INDIGESTION AND HEARTBURN   potassium chloride SA (KLOR-CON M20) 20 MEQ tablet TAKE 1 TABLET TWICE A DAY FOR POTASSIUM   sildenafil (VIAGRA) 25 MG tablet Take  25 mg by mouth daily as needed for erectile dysfunction.   tamsulosin (FLOMAX) 0.4 MG CAPS capsule Take 0.4 mg by mouth daily.   tirzepatide [facility]) 5 MG/0.5ML Pen Inject 5 mg into the skin once a week.   No current facility-administered medications on file prior to visit.    ROS: all negative except what is noted in the HPI.   Physical Exam:  BP 136/72   Pulse 74   Temp (!) 97.5 F (36.4 C)   Ht '6\' 2"'$  (1.88 m)   Wt 243 lb (110.2 kg)   SpO2 97%   BMI 31.20 kg/m   General Appearance: NAD.  Awake, conversant and cooperative. Eyes: PERRLA, EOMs intact.  Sclera white.  Conjunctiva without erythema. Sinuses: No frontal/maxillary tenderness.  No nasal discharge. Nares patent.  ENT/Mouth: Ext aud canals  clear.  Bilateral TMs w/DOL and without erythema or bulging. Hearing intact.  Posterior pharynx without swelling or exudate.  Tonsils without swelling or erythema.  Neck: Supple.  No masses, nodules or thyromegaly. Respiratory: Effort is regular with non-labored breathing. Breath sounds are equal bilaterally with diminished right posterior lower lung base breath sounds.   Cardio: RRR with no MRGs. Brisk peripheral pulses without edema.  Abdomen: Active BS in all four quadrants.  Soft and non-tender without guarding, rebound tenderness, hernias or masses. Lymphatics: Non tender without lymphadenopathy.  Musculoskeletal: Full ROM, 5/5 strength, favors left knee when walking.  Tender to palpation along medial condyle of tibia. Pain with flexion, unable to fully flex without pain. No redness, swelling, warmth noted. . Skin: Appropriate color for ethnicity. Warm without rashes, lesions, ecchymosis, ulcers.  Neuro: CN II-XII grossly normal. Normal muscle tone without cerebellar symptoms and intact sensation.   Psych: AO X 3,  appropriate mood and affect, insight and judgment.     Darrol Jump, NP 10:42 AM [facility]

## 2022-04-30 ENCOUNTER — Encounter: Payer: Self-pay | Admitting: Nurse Practitioner

## 2022-05-07 ENCOUNTER — Other Ambulatory Visit: Payer: Self-pay | Admitting: Internal Medicine

## 2022-05-07 DIAGNOSIS — E139 Other specified diabetes mellitus without complications: Secondary | ICD-10-CM

## 2022-05-28 ENCOUNTER — Ambulatory Visit: Payer: 59 | Admitting: Nurse Practitioner

## 2022-06-02 ENCOUNTER — Ambulatory Visit: Payer: 59 | Admitting: Nurse Practitioner

## 2022-06-09 ENCOUNTER — Encounter: Payer: Self-pay | Admitting: Neurology

## 2022-06-10 ENCOUNTER — Other Ambulatory Visit: Payer: Self-pay | Admitting: Neurology

## 2022-06-10 DIAGNOSIS — G4733 Obstructive sleep apnea (adult) (pediatric): Secondary | ICD-10-CM

## 2022-06-16 ENCOUNTER — Ambulatory Visit (INDEPENDENT_AMBULATORY_CARE_PROVIDER_SITE_OTHER): Payer: 59 | Admitting: Nurse Practitioner

## 2022-06-16 VITALS — BP 118/80 | HR 77 | Temp 97.9°F | Resp 17 | Ht 74.0 in | Wt 243.2 lb

## 2022-06-16 DIAGNOSIS — E559 Vitamin D deficiency, unspecified: Secondary | ICD-10-CM

## 2022-06-16 DIAGNOSIS — Z794 Long term (current) use of insulin: Secondary | ICD-10-CM

## 2022-06-16 DIAGNOSIS — I10 Essential (primary) hypertension: Secondary | ICD-10-CM

## 2022-06-16 DIAGNOSIS — E1122 Type 2 diabetes mellitus with diabetic chronic kidney disease: Secondary | ICD-10-CM

## 2022-06-16 DIAGNOSIS — K2100 Gastro-esophageal reflux disease with esophagitis, without bleeding: Secondary | ICD-10-CM

## 2022-06-16 DIAGNOSIS — E1169 Type 2 diabetes mellitus with other specified complication: Secondary | ICD-10-CM

## 2022-06-16 DIAGNOSIS — N182 Chronic kidney disease, stage 2 (mild): Secondary | ICD-10-CM

## 2022-06-16 DIAGNOSIS — Z79899 Other long term (current) drug therapy: Secondary | ICD-10-CM

## 2022-06-16 DIAGNOSIS — I7121 Aneurysm of the ascending aorta, without rupture: Secondary | ICD-10-CM

## 2022-06-16 DIAGNOSIS — R002 Palpitations: Secondary | ICD-10-CM

## 2022-06-16 DIAGNOSIS — E119 Type 2 diabetes mellitus without complications: Secondary | ICD-10-CM

## 2022-06-16 DIAGNOSIS — E785 Hyperlipidemia, unspecified: Secondary | ICD-10-CM

## 2022-06-16 LAB — VITAMIN D 25 HYDROXY (VIT D DEFICIENCY, FRACTURES): Vit D, 25-Hydroxy: 86 ng/mL (ref 30–100)

## 2022-06-16 NOTE — Progress Notes (Signed)
Complete Physical  Assessment and Plan:  Essential hypertension Continue Olmesartan-HCTZ, bASA Discussed DASH (Dietary Approaches to Stop Hypertension) DASH diet is lower in sodium than a typical American diet. Cut back on foods that are high in saturated fat, cholesterol, and trans fats. Eat more whole-grain foods, fish, poultry, and nuts Remain active and exercise as tolerated daily.  Monitor BP at home-Call if greater than 130/80.  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 Elvera Lennox, Endocrinology Continue Greggory Keen, Metformin, Humalog Education: Reviewed 'ABCs' of diabetes management  Discussed goals to be met and/or maintained include A1C (<7) Blood pressure (<130/80) Cholesterol (LDL <70) Continue Eye Exam yearly  Continue Dental Exam Q6 mo Discussed dietary recommendations Discussed Physical Activity recommendations Foot exam UTD Check and monitor A1C   CKD (chronic kidney disease) stage 2 with T2DM (HCC) Discussed how what you eat and drink can aide in kidney protection. Stay well hydrated. Avoid high salt foods. Avoid NSAIDS. Keep BP and BG well controlled.   Take medications as prescribed. Remain active and exercise as tolerated daily. Maintain weight.  Continue to monitor. Check and monitor CMP/GFR/Microablumin  Hyperlipidemia Continue Atorvastatin. Discussed lifestyle modifications. Recommended diet heavy in fruits and veggies, omega 3's. Decrease consumption of animal meats, cheeses, and dairy products. Remain active and exercise as tolerated. Continue to monitor. Check and monitor lipids/TSH  Vitamin D deficiency Continue supplement Monitor levels.  Gastroesophageal reflux disease with esophagitis No suspected reflux complications ([place]/stricture). Lifestyle modification:  wt loss, avoid meals 2-3h before bedtime. Consider eliminating food triggers:  chocolate, caffeine, EtOH, acid/spicy  food. Continue screenings as suggested.  Ascending aorta aneurysm Continue following with Cardiology  Control blood pressure, cholesterol, glucose, increase exercise.   Medication Management All medications discussed and reviewed in full. All questions and concerns regarding medications addressed.    Palpitations Cardiac monitor to be placed Cardiology following Dr. Elmarie Shiley Report to ER or call 911 for any increase in stroke like symptoms, including HA, N/V, paralysis, difficulty speaking, trouble walking, confusion, vision changes, CP, heart palpitations, SOB, diaphoresis.   Orders Placed This Encounter  Procedures   CBC with Differential/Platelet   COMPLETE METABOLIC PANEL WITH GFR   Lipid panel   Hemoglobin A1c   VITAMIN D 25 Hydroxy (Vit-D Deficiency, Fractures)   LONG TERM MONITOR (3-14 DAYS)    Standing Status:   Future    Number of Occurrences:   1    Standing Expiration Date:   06/16/2023    Order Specific Question:   Where should this test be performed?    Answer:   [facility]    Order Specific Question:   Does the patient have an implanted cardiac device?    Answer:   No    Order Specific Question:   Prescribed days of wear    Answer:   72    Order Specific Question:   Type of enrollment    Answer:   Home Enrollment    Order Specific Question:   Vendor:    Answer:   [employer]   Discussed med's effects and SE's. Screening labs and tests as requested with regular follow-up as recommended.  Over 25 minutes of exam, counseling, chart review and critical decision making was performed  Future Appointments  Date Time Provider Department Center  06/26/2022  2:40 PM Carlus Pavlov, MD [facility] None  09/09/2022  1:00 PM [facility] None  10/27/2022  3:00 PM Adela Glimpse, NP [facility] None  01/08/2023  1:30 PM Dohmeier, Porfirio Mylar, MD [facility] None     HPI 67 y.o. male patient presents for a general follow up. has Ventral hernia - periumbilical 2-3cm;  Morbid obesity (HCC) - BMI 30+ with OSA; Hyperlipidemia associated with type 2 diabetes mellitus; Essential hypertension; Vitamin D deficiency; GERD (gastroesophageal reflux disease); CKD stage 2 due to type 2 diabetes mellitus; Obesity hypoventilation syndrome (HCC); BPH (benign prostatic hyperplasia); Erectile dysfunction associated with type 2 diabetes mellitus; PVC's (premature ventricular contractions); Aneurysm, ascending aorta (HCC); LADA (latent autoimmune diabetes in adults), managed as type 2; Family history of cerebrovascular disease; and OSA on CPAP on their problem list.   He is now on Dexcom. A1c running 6.6% down from 7.2% in 10/2021.  He continues to follow with Dr. Elvera Lennox.    Continuing to follow with Urology.  Takeing medications for BPH as precsribed. Effective. Saw [facility] 02/12/22 for evaluation after TRUS prostate biopsy on 01/31/22 which was negative for malignancy.  He has BPH with LUTS currently on tamsulosin 0.8 mg and 5 mg fomasteride.  He continues to have a weak stream and difficulty fully emtpything. Plan is to continue medications for now.  Discussed TURP, optilume, Rezum and patient will continue think about it.  Repeat PSA in 6 mo at his follow up.  He has OSA on CPAP.  Follows with Dr. Vickey Huger also with obesity hypoventilation syndrome and followed by neurology; he endorses 100% compliance with restorative sleep. He has xanax script for anxiety but rare use, last filled 30 tabs 01/02/2020.   BMI is Body mass index is 31.23 kg/m., he does somewhat watch his diet Wt Readings from Last 3 Encounters:  06/16/22 243 lb 3.2 oz (110.3 kg)  04/28/22 243 lb (110.2 kg)  04/03/22 245 lb 12.8 oz (111.5 kg)   He has known ascending aorta dilation, It was 4.2 cm in 2016, 4.1 cm per recent ECHO 11/2017 by cardiology, plan for follow up in 2 years. He had repeat stress test and ECHO in 11/2017 which were essentially normal. Follows with Dr. Chilton Si.  He does share  some intermittent chest pain with palpitations.  Feels as though is heart will race fast but then feeling goes away.  This happens infrequently and intermittently.  Denies any other associated symptoms including chest pain, shortness of breath, N/V, diaphoresis.  His blood pressure has been controlled at home, today their BP is BP: 118/80 He does not workout. He denies chest pain, shortness of breath, dizziness.   He is on cholesterol medication (atorvastatin 40 mg daily) and denies myalgias. His cholesterol is at goal. The cholesterol last visit was:   Lab Results  Component Value Date   CHOL 152 10/23/2021   HDL 49 10/23/2021   LDLCALC 75 10/23/2021   TRIG 188 (H) 10/23/2021   CHOLHDL 3.1 10/23/2021   He has been working on diet for LADA managed as T2 diabetes, now has freestyle [place], followed by Dr. Elvera Lennox, he is on bASA, he is on ACE/ARB and denies foot ulcerations, increased appetite, nausea, paresthesia of the feet, polydipsia, polyuria, visual disturbances, vomiting and weight loss.   Last A1C in the office was:  Lab Results  Component Value Date   HGBA1C 6.0 (A) 02/21/2022   He has CKD II associated with T2DM. Last GFR: Lab Results  Component Value Date   GFRNONAA >60 12/27/2020    Patient is on Vitamin D supplement, taking 5000 IU daily  Lab Results  Component Value Date   VD25OH 75 10/23/2021  Last PSA was: Lab Results  Component Value Date   PSA 4.1 (H) 09/21/2018   PSA 3.2 08/04/2017   PSA 3.0 06/02/2016  He was referred to [facility].  He endorses slow stream occasionally if he gets up late at night to urinate; typically 1-2 times in the evening.    Current Medications:  Current Outpatient Medications on File Prior to Visit  Medication Sig Dispense Refill   ALPRAZolam (XANAX) 1 MG tablet Take     1/2 to 1 tablet        2 to 3 x /day          as needed for Anxiety attack 30 tablet 0   aspirin 81 MG tablet Take 81 mg by mouth daily.     atorvastatin  (LIPITOR) 40 MG tablet Take 1 tablet (40 mg total) by mouth daily. for cholesterol 90 tablet 3   BD INSULIN SYRINGE U/F 31G X 5/16" 0.5 ML MISC INJECT INSULIN TWICE A DAY AS DIRECTED 200 each 3   Cholecalciferol (VITAMIN D) 125 MCG (5000 UT) CAPS Take 10,000 Units by mouth daily. 60 capsule 0   Continuous Blood Gluc Sensor (DEXCOM G7 SENSOR) MISC APPLY 1 SENSOR EVERY 10 DAYS 9 each 3   Cyanocobalamin (B-12) 2500 MCG SUBL Place under the tongue daily.     finasteride (PROSCAR) 5 MG tablet Take 5 mg by mouth daily.     glucose 4 GM chewable tablet Chew 1 tablet (4 g total) by mouth as needed for low blood sugar. 30 tablet 12   glucose blood (FREESTYLE TEST STRIPS) test strip Use 1- 2 times a day with the freestyle libre 14.  CGM glucometer 150 each 3   injection device for insulin (CEQUR SIMPLICITY 2U) DEVI 1 each by Other route every 3 (three) days. 30 each 1   Injection Device for Insulin (CEQUR SIMPLICITY INSERTER) MISC Use as advised 1 each 0   insulin glargine, 2 Unit Dial, (TOUJEO MAX SOLOSTAR) 300 UNIT/ML Solostar Pen Inject 44-50 Units into the skin daily. 15 mL 2   insulin lispro (HUMALOG) 100 UNIT/ML injection INJECT under skin up to 50 units a day as advised 50 mL 2   Magnesium 500 MG TABS Take 1 tablet by mouth daily.     metFORMIN (GLUCOPHAGE-XR) 500 MG 24 hr tablet TAKE 2 TABLETS TWICE A DAY WITH MEALS FOR DIABETES (Patient taking differently: Take 500 mg by mouth 2 (two) times daily with a meal. TAKE 2 TABLETS TWICE A DAY WITH MEALS FOR DIABETES) 360 tablet 3   olmesartan-hydrochlorothiazide (BENICAR HCT) 40-25 MG tablet Take 1 tablet by mouth daily. 90 tablet 3   omeprazole (PRILOSEC) 20 MG capsule TAKE 1 CAPSULE DAILY FOR INDIGESTION AND HEARTBURN 90 capsule 3   potassium chloride SA (KLOR-CON M20) 20 MEQ tablet TAKE 1 TABLET TWICE A DAY FOR POTASSIUM 180 tablet 3   sildenafil (VIAGRA) 25 MG tablet Take 25 mg by mouth daily as needed for erectile dysfunction.     tamsulosin (FLOMAX)  0.4 MG CAPS capsule Take 0.4 mg by mouth daily.     tirzepatide [facility]) 5 MG/0.5ML Pen Inject 5 mg into the skin once a week. 6 mL 3   Current Facility-Administered Medications on File Prior to Visit  Medication Dose Route Frequency Provider Last Rate Last Admin   ipratropium-albuterol (DUONEB) 0.5-2.5 (3) MG/3ML nebulizer solution 3 mL  3 mL Nebulization Once Adela Glimpse, NP       Allergies:  Allergies  Allergen Reactions  Crestor [Rosuvastatin] Cough   Health Maintenance:  Immunization History  Administered Date(s) Administered   Fluad Quad(high Dose 65+) 12/14/2020   Influenza Inj Mdck Quad With Preservative 12/19/2016   Influenza Split 11/28/2013, 12/06/2014   Influenza, High Dose Seasonal PF 12/23/2019   Influenza,inj,Quad PF,6+ Mos 12/23/2017   Influenza-Unspecified 12/19/2011, 01/04/2013, 01/01/2016, 12/23/2017   Moderna Covid-19 Vaccine Bivalent Booster 79yrs & up 03/18/2021   Moderna SARS-COV2 Booster Vaccination 07/24/2020   Moderna Sars-Covid-2 Vaccination 04/04/2019, 05/03/2019, 12/23/2019   Pneumococcal Conjugate-13 09/29/2019   Pneumococcal Polysaccharide-23 03/03/2006, 10/05/2020   Td 03/03/2008, 03/16/2018   Zoster, Live 02/07/2011  Patient Care Team: Lucky Cowboy, MD as PCP - General (Internal Medicine) Jethro Bolus, MD as Consulting Physician (Ophthalmology) Bernette Redbird, MD as Consulting Physician (Gastroenterology) Laurey Morale, MD as Consulting Physician (Cardiology) Chilton Si, MD as Attending Physician (Cardiology) Herma Mering, MD as Referring Physician (Dermatology)  Medical History:  has Ventral hernia - periumbilical 2-3cm; Morbid obesity (HCC) - BMI 30+ with OSA; Hyperlipidemia associated with type 2 diabetes mellitus; Essential hypertension; Vitamin D deficiency; GERD (gastroesophageal reflux disease); CKD stage 2 due to type 2 diabetes mellitus; Obesity hypoventilation syndrome (HCC); BPH (benign prostatic hyperplasia);  Erectile dysfunction associated with type 2 diabetes mellitus; PVC's (premature ventricular contractions); Aneurysm, ascending aorta (HCC); LADA (latent autoimmune diabetes in adults), managed as type 2; Family history of cerebrovascular disease; and OSA on CPAP on their problem list. Surgical History:  He  has a past surgical history that includes Anal fissure repair (2007); Ventral hernia repair (04/13/12); and Tonsillectomy (Bilateral). Family History:  His family history includes Atrial fibrillation in his mother; Cancer in his paternal grandfather; Cancer (age of onset: 75) in his paternal uncle; Dementia in his paternal grandmother; Kidney cancer (age of onset: 61) in his father; Stroke in his maternal grandmother. Social History:   reports that he has never smoked. He has never used smokeless tobacco. He reports current alcohol use of about 3.0 - 5.0 standard drinks of alcohol per week. He reports that he does not use drugs.  Review of Systems:  Review of Systems  Constitutional:  Negative for malaise/fatigue and weight loss.  HENT:  Positive for congestion. Negative for ear pain, hearing loss, sinus pain, sore throat and tinnitus.   Eyes:  Negative for blurred vision and double vision.  Respiratory:  Positive for cough (intermittent, clear secretions). Negative for sputum production, shortness of breath and wheezing.   Cardiovascular:  Negative for chest pain, palpitations, orthopnea, claudication, leg swelling and PND.  Gastrointestinal:  Negative for abdominal pain, blood in stool, constipation, diarrhea, heartburn, melena, nausea and vomiting.  Genitourinary: Negative.   Musculoskeletal:  Negative for falls, joint pain and myalgias.  Skin:  Negative for rash.  Neurological:  Negative for dizziness, tingling, sensory change, weakness and headaches.  Endo/Heme/Allergies:  Negative for polydipsia.  Psychiatric/Behavioral: Negative.  Negative for depression, memory loss, substance abuse  and suicidal ideas. The patient is not nervous/anxious and does not have insomnia.   All other systems reviewed and are negative.   Physical Exam: Estimated body mass index is 31.23 kg/m as calculated from the following:   Height as of this encounter:  (1.88 m).   Weight as of this encounter: 243 lb 3.2 oz (110.3 kg). BP 118/80   Pulse 77   Temp 97.9 F (36.6 C)   Resp 17   Ht  (1.88 m)   Wt 243 lb 3.2 oz (110.3 kg)   SpO2 97%   BMI  31.23 kg/m  General Appearance: Well nourished, in no apparent distress.  Eyes: PERRLA, EOMs, conjunctiva no swelling or erythema Sinuses: No Frontal/maxillary tenderness  ENT/Mouth: Ext aud canals clear, normal light reflex with TMs without erythema, bulging. Good dentition. No erythema, swelling, or exudate on post pharynx. Tonsils not swollen or erythematous. Hearing normal.  Neck: Supple, thyroid normal. No bruits  Respiratory: Respiratory effort normal, BS equal bilaterally without rales, rhonchi, wheezing or stridor.  Cardio: RRR without murmurs, rubs or gallops. Brisk peripheral pulses without edema.  Chest: symmetric, with normal excursions and percussion.  Abdomen: Soft, obese abdomen, nontender, no guarding, rebound, masses, or organomegaly. He has easily reduced ventral hernia.  Lymphatics: Non tender without lymphadenopathy.  Genitourinary: defer to urology Musculoskeletal: Full ROM all peripheral extremities,5/5 strength, and normal gait.  Skin: Warm, dry without rashes, ecchymosis. He has dark brown, raised 4 mm x 12 mm lesion to R mid back with crusting, irregular borders and coloring to R mid back (has seen derm and cleared). Scabbed lesion to bridge of nose.  Neuro: Cranial nerves intact, reflexes equal bilaterally. Normal muscle tone, no cerebellar symptoms. Sensation intact.  Psych: Awake and oriented X 3, normal affect, Insight and Judgment appropriate.   Chisom Aust 4:00 PM [facility]

## 2022-06-17 ENCOUNTER — Ambulatory Visit: Payer: 59 | Attending: Nurse Practitioner

## 2022-06-17 DIAGNOSIS — R002 Palpitations: Secondary | ICD-10-CM

## 2022-06-17 LAB — CBC WITH DIFFERENTIAL/PLATELET
Absolute Monocytes: 563 cells/uL (ref 200–950)
Basophils Absolute: 51 cells/uL (ref 0–200)
Basophils Relative: 0.8 %
Eosinophils Absolute: 109 cells/uL (ref 15–500)
Eosinophils Relative: 1.7 %
HCT: 44.6 % (ref 38.5–50.0)
Hemoglobin: 14.9 g/dL (ref 13.2–17.1)
Lymphs Abs: 1786 cells/uL (ref 850–3900)
MCH: 28.3 pg (ref 27.0–33.0)
MCHC: 33.4 g/dL (ref 32.0–36.0)
MCV: 84.6 fL (ref 80.0–100.0)
MPV: 10.7 fL (ref 7.5–12.5)
Monocytes Relative: 8.8 %
Neutro Abs: 3891 cells/uL (ref 1500–7800)
Neutrophils Relative %: 60.8 %
Platelets: 233 10*3/uL (ref 140–400)
RBC: 5.27 10*6/uL (ref 4.20–5.80)
RDW: 13.5 % (ref 11.0–15.0)
Total Lymphocyte: 27.9 %
WBC: 6.4 10*3/uL (ref 3.8–10.8)

## 2022-06-17 LAB — COMPLETE METABOLIC PANEL WITH GFR
AG Ratio: 1.5 (calc) (ref 1.0–2.5)
ALT: 13 U/L (ref 9–46)
AST: 16 U/L (ref 10–35)
Albumin: 3.8 g/dL (ref 3.6–5.1)
Alkaline phosphatase (APISO): 84 U/L (ref 35–144)
BUN: 17 mg/dL (ref 7–25)
CO2: 29 mmol/L (ref 20–32)
Calcium: 9 mg/dL (ref 8.6–10.3)
Chloride: 102 mmol/L (ref 98–110)
Creat: 0.94 mg/dL (ref 0.70–1.35)
Globulin: 2.5 g/dL (calc) (ref 1.9–3.7)
Glucose, Bld: 62 mg/dL — ABNORMAL LOW (ref 65–99)
Potassium: 3.4 mmol/L — ABNORMAL LOW (ref 3.5–5.3)
Sodium: 141 mmol/L (ref 135–146)
Total Bilirubin: 0.9 mg/dL (ref 0.2–1.2)
Total Protein: 6.3 g/dL (ref 6.1–8.1)
eGFR: 88 mL/min/{1.73_m2} (ref >=60)

## 2022-06-17 LAB — LIPID PANEL
Cholesterol: 138 mg/dL (ref <200)
HDL: 49 mg/dL (ref >=40)
LDL Cholesterol (Calc): 66 mg/dL (calc)
Non-HDL Cholesterol (Calc): 89 mg/dL (calc) (ref <130)
Total CHOL/HDL Ratio: 2.8 (calc) (ref <5.0)
Triglycerides: 143 mg/dL (ref <150)

## 2022-06-17 LAB — HEMOGLOBIN A1C
Hgb A1c MFr Bld: 6.9 % of total Hgb — ABNORMAL HIGH (ref <5.7)
Mean Plasma Glucose: 151 mg/dL
eAG (mmol/L): 8.4 mmol/L

## 2022-06-17 NOTE — Progress Notes (Unsigned)
Enrolled for [employer] to mail a [employer] XT long term holter monitor to the patients address on file.   DOD to read. 

## 2022-06-18 ENCOUNTER — Other Ambulatory Visit: Payer: Self-pay | Admitting: Internal Medicine

## 2022-06-18 DIAGNOSIS — E139 Other specified diabetes mellitus without complications: Secondary | ICD-10-CM

## 2022-06-19 ENCOUNTER — Encounter: Payer: Self-pay | Admitting: Nurse Practitioner

## 2022-06-23 ENCOUNTER — Ambulatory Visit: Payer: 59 | Admitting: Internal Medicine

## 2022-06-26 ENCOUNTER — Encounter: Payer: Self-pay | Admitting: Internal Medicine

## 2022-06-26 ENCOUNTER — Ambulatory Visit (INDEPENDENT_AMBULATORY_CARE_PROVIDER_SITE_OTHER): Payer: 59 | Admitting: Internal Medicine

## 2022-06-26 VITALS — BP 130/82 | HR 78 | Ht 74.0 in | Wt 243.2 lb

## 2022-06-26 DIAGNOSIS — Z7984 Long term (current) use of oral hypoglycemic drugs: Secondary | ICD-10-CM | POA: Diagnosis not present

## 2022-06-26 DIAGNOSIS — Z7985 Long-term (current) use of injectable non-insulin antidiabetic drugs: Secondary | ICD-10-CM | POA: Diagnosis not present

## 2022-06-26 DIAGNOSIS — E139 Other specified diabetes mellitus without complications: Secondary | ICD-10-CM

## 2022-06-26 DIAGNOSIS — E1169 Type 2 diabetes mellitus with other specified complication: Secondary | ICD-10-CM

## 2022-06-26 DIAGNOSIS — E669 Obesity, unspecified: Secondary | ICD-10-CM | POA: Diagnosis not present

## 2022-06-26 DIAGNOSIS — E1369 Other specified diabetes mellitus with other specified complication: Secondary | ICD-10-CM | POA: Diagnosis not present

## 2022-06-26 DIAGNOSIS — E785 Hyperlipidemia, unspecified: Secondary | ICD-10-CM

## 2022-06-26 DIAGNOSIS — Z794 Long term (current) use of insulin: Secondary | ICD-10-CM

## 2022-06-26 MED ORDER — TIRZEPATIDE 7.5 MG/0.5ML ~~LOC~~ SOAJ
7.5000 mg | SUBCUTANEOUS | 3 refills | Status: DC
Start: 2022-06-26 — End: 2022-11-04

## 2022-06-26 MED ORDER — CEQUR SIMPLICITY 2U DEVI: 1.0000 | 1 refills | Status: DC

## 2022-06-26 MED ORDER — METFORMIN HCL ER 500 MG PO TB24: ORAL_TABLET | ORAL | 3 refills | Status: DC

## 2022-06-26 MED ORDER — INSULIN LISPRO 100 UNIT/ML IJ SOLN
INTRAMUSCULAR | 3 refills | Status: DC
Start: 2022-06-26 — End: 2022-11-04

## 2022-06-26 NOTE — Progress Notes (Signed)
Patient ID: Harry [name], male   DOB: 11-29-1955, 67 y.o.   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 Last visit 4 months ago. 10/01/2021: changed Insurance from [employer] to [employer].  Interim history: No increased urination, blurry vision, nausea, chest pain.  He recently had palpitations and has an event monitor that he will start wearing.   He has been having problems with sugars continuing doing these after meals and then decreasing abruptly.  He feels that switching from Humalog to lispro may have caused this problem.  He also had gestational HSV about 2 to show since last visit.  Reviewed HbA1c levels: Lab Results  Component Value Date   HGBA1C 6.9 (H) 06/16/2022   HGBA1C 6.0 (A) 02/21/2022   HGBA1C 7.4 (H) 10/23/2021   He is on: - Metformin ER 1000 >> 500 mg in am and 500 mg at night -decreased 2/2 diarrhea -now tolerated well - Trulicity 1.5 >> 3 mg weekly >> Mounjaro 5 mg daily - Tresiba 44 >> 48 >> 42-44 >> 46 >> 44 >> Toujeo 42-44 units at bedtime - Humalog >> Lispro - 15 min before meals: in the Cequr pump - he likes it  5 units in am 12-15 >> 12-20 units before lunch 12-18 >> 12-20 units before dinner Correction at bedtime:4-5 units Sliding scale of Humalog >> Lispro: - 141-160: + 2 units  - 161-180: + 3 units  - 181-200: + 4 units  - 201-240: + 5 units  - 241-280: + 6 units  - 281-320: + 7 units  - 321-360: + 8 units  - >361: + 10 units He had burning at the inj site with Lyumjev. Previously on Lantus.  He checks his sugars more than 4 times a day with his CGM:  Previously:  Previously:   Lowest sugar was 30s ... >> 50 >> 48 >> 52 >> 50; he has hypoglycemia awareness in the 60s. Highest sugar was in the HI >> 300s >> 252 >> 200s.  Glucometer: one touch ultra 2  Pt's meals are: -  Breakfast: Glucerna, banana >> oatmeal >> English muffin or boiled egg or oatmeal - Lunch: eats out - salad with chicken; chicken burrito; sandwich; chick-fil-a >> sandwich with either tomatoes or peanut butter and jelly + hold milk - Dinner: meat + veggie + starch - Snacks: potato chips, fruit, icecream No sodas.  -+ Mild CKD, last BUN/creatinine:  Lab Results  Component Value Date   BUN 17 06/16/2022   BUN 18 01/08/2022   CREATININE 0.94 06/16/2022   CREATININE 0.85 01/08/2022  On losartan.  -+ HL; last set of lipids: Lab Results  Component Value Date   CHOL 138 06/16/2022   HDL 49 06/16/2022   LDLCALC 66 06/16/2022   TRIG 143 06/16/2022   CHOLHDL 2.8 06/16/2022  On Lipitor 40.  - last eye exam was 03/18/2022: No DR  -No numbness and tingling in his feet.  Foot exam 10/18/2021.  He also has a history of HTN, GERD, OSA-on CPAP.  His wife passed away with cancer in 2018-05-24.  ROS: + See HPI  I reviewed pt's medications, allergies, PMH, social hx, family hx, and changes were documented in the history of present illness. Otherwise, unchanged from my initial visit note.  Past Medical History:  Diagnosis Date   Diabetes  mellitus without complication (HCC) 1987   insulin requiring   Elevated PSA 09/22/2018   [facility] is following    GERD (gastroesophageal reflux disease)    Hyperlipidemia    Hypertension    Hypokalemia    Hypomagnesemia    OSA on CPAP    10-12 years ago   Vitamin D deficiency    Past Surgical History:  Procedure Laterality Date   ANAL FISSURE REPAIR  2007   TONSILLECTOMY Bilateral    As a child   VENTRAL HERNIA REPAIR  04/13/12   Dr. Michaell Cowing, ventral hernia repair w/ mesh   Social History   Social History   Marital status: Married    Spouse name: N/A   Number of children: 2   Occupational History   estimator   Social History Main Topics   Smoking status: Never Smoker   Smokeless tobacco: Not on file   Alcohol use 0.0 oz/week     2 - 5 Cans of beer per week   Current Outpatient Medications on File Prior to Visit  Medication Sig Dispense Refill   ALPRAZolam (XANAX) 1 MG tablet Take     1/2 to 1 tablet        2 to 3 x /day          as needed for Anxiety attack 30 tablet 0   aspirin 81 MG tablet Take 81 mg by mouth daily.     atorvastatin (LIPITOR) 40 MG tablet Take 1 tablet (40 mg total) by mouth daily. for cholesterol 90 tablet 3   BD INSULIN SYRINGE U/F 31G X 5/16" 0.5 ML MISC INJECT INSULIN TWICE A DAY AS DIRECTED 200 each 3   Cholecalciferol (VITAMIN D) 125 MCG (5000 UT) CAPS Take 10,000 Units by mouth daily. 60 capsule 0   Continuous Blood Gluc Sensor (DEXCOM G7 SENSOR) MISC APPLY 1 SENSOR EVERY 10 DAYS 9 each 3   Cyanocobalamin (B-12) 2500 MCG SUBL Place under the tongue daily.     finasteride (PROSCAR) 5 MG tablet Take 5 mg by mouth daily.     glucose 4 GM chewable tablet Chew 1 tablet (4 g total) by mouth as needed for low blood sugar. 30 tablet 12   glucose blood (FREESTYLE TEST STRIPS) test strip Use 1- 2 times a day with the freestyle libre 14.  CGM glucometer 150 each 3   injection device for insulin (CEQUR SIMPLICITY 2U) DEVI 1 each by Other route every 3 (three) days. 30 each 1   Injection Device for Insulin (CEQUR SIMPLICITY INSERTER) MISC Use as advised 1 each 0   insulin glargine, 2 Unit Dial, (TOUJEO MAX SOLOSTAR) 300 UNIT/ML Solostar Pen Inject 42-44 Units into the skin daily. 18 mL 3   insulin lispro (HUMALOG) 100 UNIT/ML injection INJECT under skin up to 50 units a day as advised 50 mL 2   Magnesium 500 MG TABS Take 1 tablet by mouth daily.     metFORMIN (GLUCOPHAGE-XR) 500 MG 24 hr tablet TAKE 2 TABLETS TWICE A DAY WITH MEALS FOR DIABETES (Patient taking differently: Take 500 mg by mouth 2 (two) times daily with a meal. TAKE 2 TABLETS TWICE A DAY WITH MEALS FOR DIABETES) 360 tablet 3   olmesartan-hydrochlorothiazide (BENICAR HCT) 40-25 MG tablet Take 1 tablet by mouth daily. 90 tablet 3   omeprazole  (PRILOSEC) 20 MG capsule TAKE 1 CAPSULE DAILY FOR INDIGESTION AND HEARTBURN 90 capsule 3   potassium chloride SA (KLOR-CON M20) 20 MEQ tablet TAKE 1 TABLET  TWICE A DAY FOR POTASSIUM 180 tablet 3   sildenafil (VIAGRA) 25 MG tablet Take 25 mg by mouth daily as needed for erectile dysfunction.     tamsulosin (FLOMAX) 0.4 MG CAPS capsule Take 0.4 mg by mouth daily.     tirzepatide [facility]) 5 MG/0.5ML Pen Inject 5 mg into the skin once a week. 6 mL 3   Current Facility-Administered Medications on File Prior to Visit  Medication Dose Route Frequency Provider Last Rate Last Admin   ipratropium-albuterol (DUONEB) 0.5-2.5 (3) MG/3ML nebulizer solution 3 mL  3 mL Nebulization Once Cranford, Tonya, NP       Allergies  Allergen Reactions   Crestor [Rosuvastatin] Cough   Family History  Problem Relation Age of Onset   Atrial fibrillation Mother    Kidney cancer Father 81       kidney   Stroke Maternal Grandmother    Dementia Paternal Grandmother        Early 33s   Cancer Paternal Grandfather        Unsure of age or type   Cancer Paternal Uncle 60    PE: BP 130/82 (BP Location: Right Arm, Patient Position: Sitting, Cuff Size: Normal)   Pulse 78   Ht 6\' 2"  (1.88 m)   Wt 243 lb 3.2 oz (110.3 kg)   SpO2 98%   BMI 31.23 kg/m   Wt Readings from Last 3 Encounters:  06/26/22 243 lb 3.2 oz (110.3 kg)  06/16/22 243 lb 3.2 oz (110.3 kg)  04/28/22 243 lb (110.2 kg)   Constitutional: overweight, in NAD Eyes: no exophthalmos ENT: no masses palpated in neck, no cervical lymphadenopathy Cardiovascular: RRR, No MRG Respiratory: CTA B Musculoskeletal: no deformities Skin: no rashes Neurological: no tremor with outstretched hands  ASSESSMENT: 1. LADA, insulin-dependent, uncontrolled, with complications - CKD stage 2 - ED  - he has an elliptical machine at home  Component     Latest Ref Rng & Units 07/11/2016  Hemoglobin A1C      6.5  Glutamic Acid Decarb Ab     <5 IU/mL 47 (H)   Pancreatic Islet Cell Antibody     <5 JDF Units <5  C-Peptide     0.80 - 3.85 ng/mL 0.37 (L)  Glucose, Fasting     65 - 99 mg/dL 161 (H)   Elevated GAD antibodies and decreased insulin production. 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 HL  3.  Obesity class I  PLAN:  1. Patient with longstanding, uncontrolled, insulin-dependent diabetes (LADA), on metformin, basal/bolus insulin regimen and weekly GLP-1/GIP receptor agonist, currently on the CeQur simplicity insulin pump, which he likes.  At last visit, sugars improved after starting this and an HbA1c was 6.0%.  We discussed about trying not to overcorrect high blood sugars but we also discussed that whenever he needed to correct a high or low blood sugar, to follow the sugars with a glucometer, rather than the sensor, due to the last time between blood and interstitial fluid glucose.  Otherwise, sugars are fluctuating within the target range with only occasional higher blood sugars especially after dinner.  I gave him a different sliding scale of NovoLog but otherwise we did not change the regimen. -He had another HbA1c few days ago which was higher, at 6.9% CGM interpretation: -At today's visit, we reviewed his CGM downloads: It appears that 63% of values are in target range (  goal >70%), while 35% are higher than 180 (goal <25%), and 2% are lower than 70 (goal <4%).  The calculated average blood sugar is 159.  The projected HbA1c for the next 3 months (GMI) is 7.1%. -Reviewing the CGM trends, sugars are more fluctuating than before, with 35% of the values above the target range,  after breakfast but more so after dinner and throughout the night.  He has less lows compared to before but he describes that sugars continue to be increased after eating, despite him bolusing given 30 minutes before the meals.  3 to 4 hours after a meal, the sugars then start  decreasing abruptly and they continue to decrease despite him trying to correct the lows.  He feels that this was happening more after switching from Humalog to lispro, but we discussed that this type of pattern is reminiscent of site problems.  He is using the pump on the anterior abdomen and I advised him to move it either posteriorly on the abdomen, but better on the upper thighs or arms.  He will try this. -We can also try to increase the Mounjaro dose as he feels that he is not getting any more effect from the 5 mg dose.  I did advise him that he may need to start reducing the Toujeo and lispro dose while increasing the Mounjaro. - I advised him to:  Patient Instructions  Please continue: - Metformin ER 500 mg in am and 500 mg at night  Try to increase: - Mounjaro 7.5 mg weekly  You may need to reduce: - Toujeo 44 units daily - Lispro - 15 min before meals:  5 units in am 12-20 units before lunch 12-20 units before dinner Sliding scale of Lispro: - 141-160: + 2 units  - 161-180: + 3 units  - 181-200: + 4 units  - 201-240: + 5 units  - 241-280: + 6 units  - 281-320: + 7 units  - 321-360: + 8 units  - >361: + 10 units  Please return in 4 months.    - advised to check sugars at different times of the day - 4x a day, rotating check times - advised for yearly eye exams >> he is UTD - return to clinic in 4 months   2. HL -Reviewed latest lipid panel from 06/2022: LDL improved, at goal, the rest the fractions also at goal: Lab Results  Component Value Date   CHOL 138 06/16/2022   HDL 49 06/16/2022   LDLCALC 66 06/16/2022   TRIG 143 06/16/2022   CHOLHDL 2.8 06/16/2022  -He continues Lipitor 40 mg daily without side effects  3.  Obesity class I -Will switch from Trulicity to [facility] for stronger effect -he continues on Lavalette -He lost 9 pounds before last visit, but weight was stable since then   Carlus Pavlov, MD PhD [facility] Endocrinology

## 2022-06-26 NOTE — Patient Instructions (Signed)

## 2022-06-26 NOTE — Patient Instructions (Addendum)
Please continue: - Metformin ER 500 mg in am and 500 mg at night  Try to increase: - Mounjaro 7.5 mg weekly  You may need to reduce: - Toujeo 44 units daily - Lispro - 15 min before meals:  5 units in am 12-20 units before lunch 12-20 units before dinner Sliding scale of Lispro: - 141-160: + 2 units  - 161-180: + 3 units  - 181-200: + 4 units  - 201-240: + 5 units  - 241-280: + 6 units  - 281-320: + 7 units  - 321-360: + 8 units  - >361: + 10 units  Please return in 4 months.

## 2022-06-28 DIAGNOSIS — R002 Palpitations: Secondary | ICD-10-CM | POA: Diagnosis not present

## 2022-07-02 NOTE — Telephone Encounter (Signed)
Sent a mychart message to the patient because I need to r/s his HST due to we scheduling them on Wednesday the month of July.

## 2022-08-09 IMAGING — DX DG CHEST 1V PORT
1 series · 2 of 2 positions shown · non-contrast
Comparison: 04/06/2013

CLINICAL DATA: Chest pain

EXAM:
PORTABLE CHEST 1 VIEW

[Series 1: chest · 0.14mm/px · 2 of 2 slices shown]
[im 1/2]
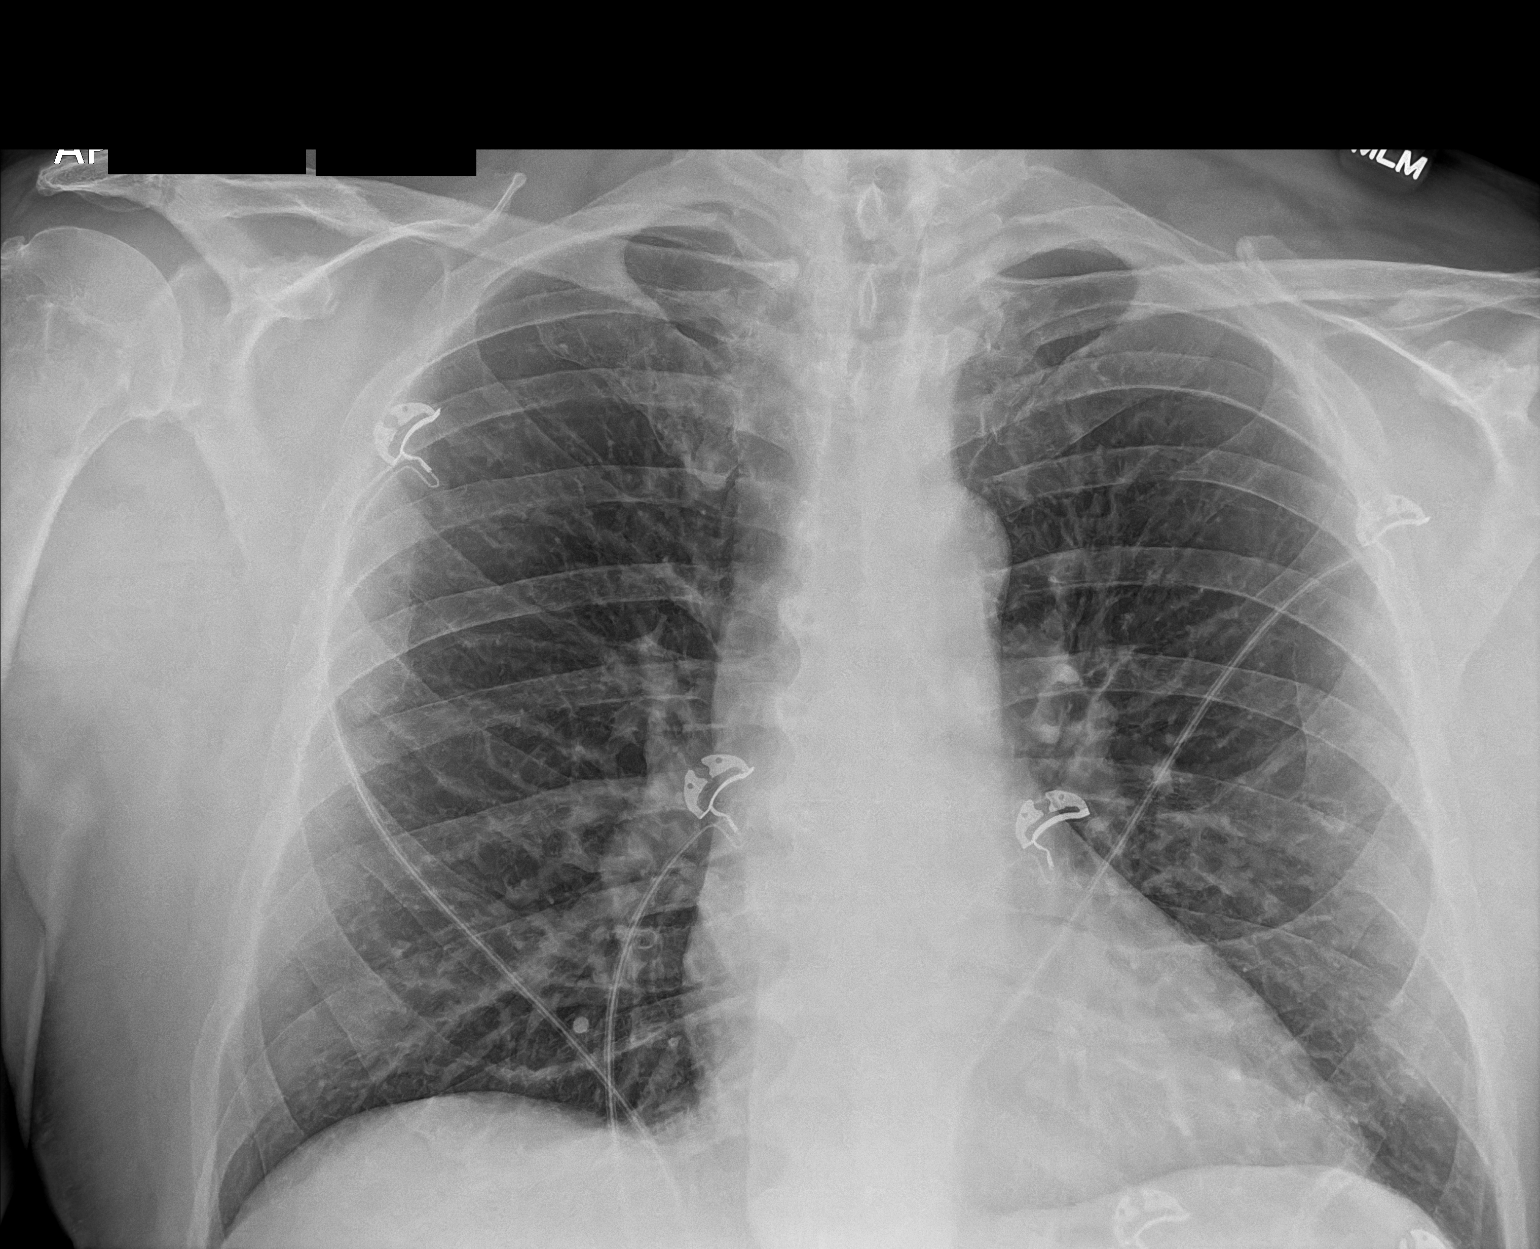
[im 2/2]
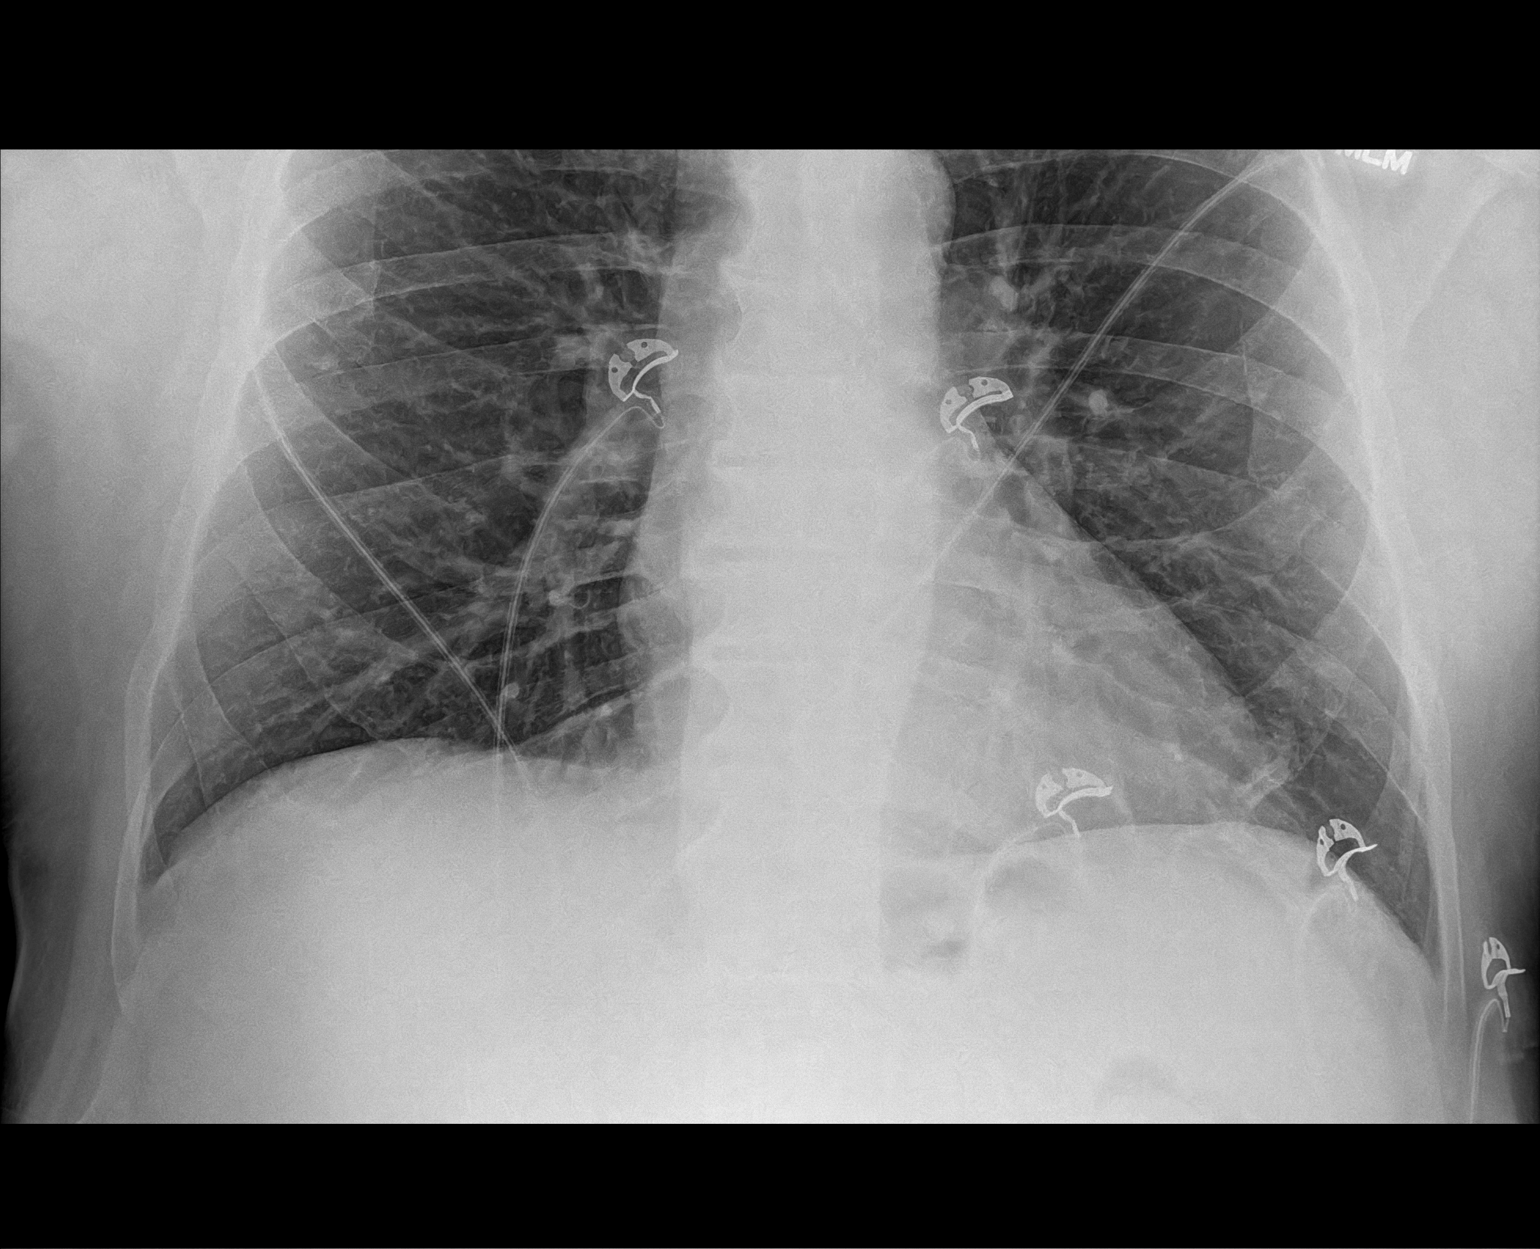

[2 of 2 positions shown; findings below may reference images not displayed]

FINDINGS: The heart size and mediastinal contours are within normal limits.
Both lungs are clear. The visualized skeletal structures are
unremarkable.
IMPRESSION: No active disease.

Reading location: [place], [place].

## 2022-08-19 ENCOUNTER — Telehealth (HOSPITAL_BASED_OUTPATIENT_CLINIC_OR_DEPARTMENT_OTHER): Payer: Self-pay | Admitting: Cardiovascular Disease

## 2022-08-19 DIAGNOSIS — I7121 Aneurysm of the ascending aorta, without rupture: Secondary | ICD-10-CM

## 2022-08-19 NOTE — Telephone Encounter (Signed)
Spoke with patient and scheduled his echo  He is aware of date, time, and location

## 2022-08-19 NOTE — Telephone Encounter (Signed)
Patient needs echo prior to visit  Called and he was at another appointment, will call back this afternoon to get scheduled

## 2022-08-19 NOTE — Telephone Encounter (Signed)
Left message for patient to call and schedule the echocardiogram ordered by Dr. Duke Salvia for October 2024

## 2022-08-19 NOTE — Telephone Encounter (Signed)
Patient wants to know if he will need to have Echocardiogram test prior to visit on 10/15.

## 2022-09-01 ENCOUNTER — Other Ambulatory Visit: Payer: Self-pay | Admitting: Internal Medicine

## 2022-09-01 ENCOUNTER — Encounter: Payer: Self-pay | Admitting: Internal Medicine

## 2022-09-01 MED ORDER — SEMAGLUTIDE (1 MG/DOSE) 4 MG/3ML ~~LOC~~ SOPN: 1.0000 mg | PEN_INJECTOR | SUBCUTANEOUS | 3 refills | Status: DC

## 2022-09-03 ENCOUNTER — Other Ambulatory Visit (HOSPITAL_COMMUNITY): Payer: Self-pay

## 2022-09-03 ENCOUNTER — Telehealth: Payer: Self-pay

## 2022-09-03 NOTE — Telephone Encounter (Signed)
Patient needs PA for Ozempic.

## 2022-09-09 ENCOUNTER — Other Ambulatory Visit (HOSPITAL_COMMUNITY): Payer: Self-pay

## 2022-09-10 ENCOUNTER — Ambulatory Visit (INDEPENDENT_AMBULATORY_CARE_PROVIDER_SITE_OTHER): Payer: 59 | Admitting: Neurology

## 2022-09-10 DIAGNOSIS — E1122 Type 2 diabetes mellitus with diabetic chronic kidney disease: Secondary | ICD-10-CM

## 2022-09-10 DIAGNOSIS — K439 Ventral hernia without obstruction or gangrene: Secondary | ICD-10-CM

## 2022-09-10 DIAGNOSIS — G4730 Sleep apnea, unspecified: Secondary | ICD-10-CM

## 2022-09-10 DIAGNOSIS — E139 Other specified diabetes mellitus without complications: Secondary | ICD-10-CM

## 2022-09-10 DIAGNOSIS — G4733 Obstructive sleep apnea (adult) (pediatric): Secondary | ICD-10-CM

## 2022-09-11 NOTE — Progress Notes (Signed)
[facility]   HOME SLEEP TEST REPORT ( by Watch PAT)   STUDY DATE:  09-11-2022    ORDERING CLINICIAN: Melvyn Novas, MD  REFERRING CLINICIAN:    CLINICAL INFORMATION/HISTORY: 04-03-2022:  Harry [name] is a 67 y.o. Caucasian male patient seen here as a re-referral on 04/03/2022 from Dr Oneta Rack for a follow up on OSA. He was last seen at the onset of the pandemic 04-2018 by Darrol Angel, NP for CPAP .  Chief concern according to patient :  I may need follow up to get supplies, and next year I am due for a new one.      Epworth sleepiness score:4 /24. FSS at 41/ 63 points, GDS 3/ 15    BMI: 31 kg/m   Neck Circumference: 18"   FINDINGS:   Sleep Summary:   Total Recording Time (hours, min):   6 h 57      Total Sleep Time (hours, min):       5 h 48 m          Percent REM (%):      14.45                                  Respiratory Indices by AASM criteria:  Calculated pAHI (per hour):      44.3/h       No central apneas are calculated                   REM pAHI:    9.7/h        ( CMS 2.4/h)                                       NREM pAHI:  50.4/h       ( CMS 36.8/h)                      Positional  AHI:   57.6/h in supine at 4 5 desat.                                                 Oxygen Saturation Statistics:   O2 Saturation Range (%):    between 83% and 99%, mean saturation: 94%.                                    O2 Saturation (minutes) <89%:    3.8 minutes        Pulse Rate Statistics:   Pulse Mean (bpm):  76 bpm               Pulse Range:  between  57 and 115 bpm.              IMPRESSION:  This HST confirms the presence of severe sleep apnea with a NREM dominance , which is suspicious of central apnea.     RECOMMENDATION: It is the NREM dominance that makes me concerned about central apnea being present, and HSTs don't always detect this form.  I like for this patient to start on auto CPAP -  and if central apneas arise, we will invite him  for in- sleep lab BiPAP/ ST titration.  It is definitely needed to treat apnea of this degree.   AUTO CPAP 6-16 cm water , 2 cm EPR and mask of choice, heated humidification.      INTERPRETING PHYSICIAN:   Melvyn Novas, MD

## 2022-09-19 NOTE — Addendum Note (Signed)
Addended by: Melvyn Novas on: 09/19/2022 11:42 AM   Modules accepted: Orders

## 2022-09-19 NOTE — Procedures (Signed)
[facility]   HOME SLEEP TEST REPORT ( by Watch PAT)   STUDY DATE:  09-11-2022    ORDERING CLINICIAN: Melvyn Novas, MD  REFERRING CLINICIAN:    CLINICAL INFORMATION/HISTORY: 04-03-2022:  Harry [name] is a 67 y.o. Caucasian male patient seen here as a re-referral on 04/03/2022 from Dr Oneta Rack for a follow up on OSA. He was last seen at the onset of the pandemic 04-2018 by Darrol Angel, NP for CPAP .  Chief concern according to patient :  I may need follow up to get supplies, and next year I am due for a new one.      Epworth sleepiness score:4 /24. FSS at 41/ 63 points, GDS 3/ 15    BMI: 31 kg/m   Neck Circumference: 18"   FINDINGS:   Sleep Summary:   Total Recording Time (hours, min):   6 h 57      Total Sleep Time (hours, min):       5 h 48 m          Percent REM (%):      14.45                                  Respiratory Indices by AASM criteria:  Calculated pAHI (per hour):      44.3/h       No central apneas are calculated                   REM pAHI:    9.7/h        ( CMS 2.4/h)                                       NREM pAHI:  50.4/h       ( CMS 36.8/h)                      Positional  AHI:   57.6/h in supine at 4 5 desat.                                                 Oxygen Saturation Statistics:   O2 Saturation Range (%):    between 83% and 99%, mean saturation: 94%.                                    O2 Saturation (minutes) <89%:    3.8 minutes        Pulse Rate Statistics:   Pulse Mean (bpm):  76 bpm               Pulse Range:  between  57 and 115 bpm.              IMPRESSION:  This HST confirms the presence of severe sleep apnea with a NREM dominance , which is suspicious of central apnea.     RECOMMENDATION: It is the NREM dominance that makes me concerned about central apnea being present, and HSTs don't always detect this form.  I like for this patient to start on auto CPAP -  and if central apneas arise, we will invite him  for in- sleep lab BiPAP/ ST titration.  It is definitely needed to treat apnea of this degree.   AUTO CPAP 6-16 cm water , 2 cm EPR and mask of choice, heated humidification.      INTERPRETING PHYSICIAN:   Melvyn Novas, MD

## 2022-09-23 ENCOUNTER — Telehealth: Payer: Self-pay | Admitting: Neurology

## 2022-09-23 ENCOUNTER — Encounter: Payer: Self-pay | Admitting: Neurology

## 2022-09-23 NOTE — Telephone Encounter (Signed)
Called the pt and reviewed the sleep study results. His current machine is through [employer] and was set up 03/02/2018. Pt has new insurance going into effect as of July but has not yet received new card. [employer] is who he is getting. Advised we would send that new card and orders to [facility] and get him established with them. Pt will upload the insurance card as soon as he receives.

## 2022-09-23 NOTE — Telephone Encounter (Signed)
-----   Message from [place] Dohmeier sent at 09/19/2022 11:42 AM EDT -----  Calculated pAHI (per hour):      44.3/h        No central apneas are calculated                   REM pAHI:    9.7/h        ( CMS 2.4/h)                                       NREM pAHI:  50.4/h       ( CMS 36.8/h)                      Positional  AHI:   57.6/h in supine at 4 % desat.   ( CMS criterion)  his HST confirms the presence of severe sleep apnea with a NREM dominance , which is suspicious of central apnea.      RECOMMENDATION: It is the NREM dominance that makes me concerned about central apnea being present, and HSTs don't always detect this form.  I like for this patient to start on auto CPAP -and if central apneas arise, we will invite him for in- sleep lab BiPAP/ ST titration.  It is definitely needed to treat apnea of this degree.    AUTO CPAP 6-16 cm water , 2 cm EPR and mask of choice, heated humidification.

## 2022-09-24 ENCOUNTER — Ambulatory Visit (INDEPENDENT_AMBULATORY_CARE_PROVIDER_SITE_OTHER): Payer: 59

## 2022-09-24 DIAGNOSIS — I7121 Aneurysm of the ascending aorta, without rupture: Secondary | ICD-10-CM

## 2022-09-24 LAB — ECHOCARDIOGRAM COMPLETE
Area-P 1/2: 3.08 cm2
S' Lateral: 2.42 cm

## 2022-09-26 ENCOUNTER — Other Ambulatory Visit: Payer: Self-pay | Admitting: Nurse Practitioner

## 2022-10-02 IMAGING — MR MR PROSTATE WO/W CM
12 series · 48 of 48 positions shown · IV contrast (multihance)
Comparison: None.

CLINICAL DATA: Elevated PSA.

EXAM:
MR PROSTATE WITHOUT AND WITH CONTRAST
TECHNIQUE: Multiplanar multisequence MRI images were obtained of the pelvis
centered about the prostate. Pre and post contrast images were
obtained.
CONTRAST:  20mL MULTIHANCE GADOBENATE DIMEGLUMINE 529 MG/ML IV SOLN

[Series 3: T2 · coronal · 3.0mm · 0.56mm/px · 1 of 25 slices shown (1 of 3)]
[im 1/25]
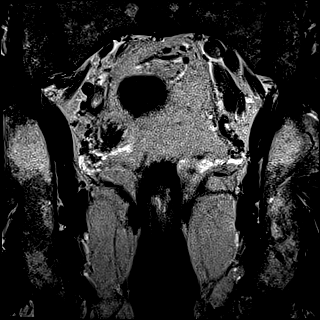

[Series 4: T1 · axial · 5.0mm · 1.25mm/px · 1 of 88 slices shown]
[im 1/88]
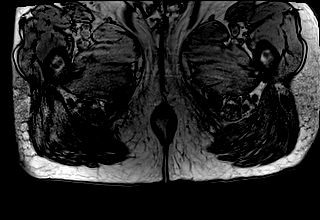

[Series 5: DWI · axial · 3.0mm · 1.75mm/px · 1 of 96 slices shown (1 of 3)]
[im 1/96]
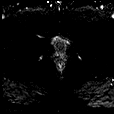

[Series 6: DWI · axial · 3.0mm · 1.75mm/px · 1 of 32 slices shown (2 of 3)]
[im 1/32]
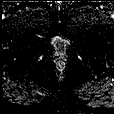

[Series 7: DWI · axial · 3.0mm · 1.75mm/px · 1 of 32 slices shown (3 of 3)]
[im 1/32]
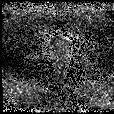

[Series 8: T2 · axial · 3.0mm · 0.56mm/px · 1 of 32 slices shown (2 of 3)]
[im 1/32]
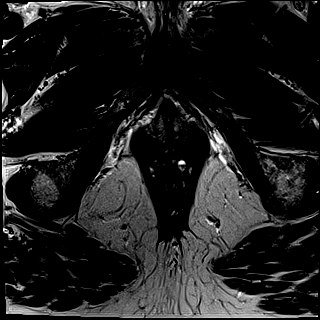

[Series 9: T2 · axial · 1.0mm · 1.04mm/px · z∈[-83,+4]mm · 2 of 88 slices shown (3 of 3)]
[im 1/88]
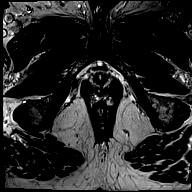
[im 88/88]
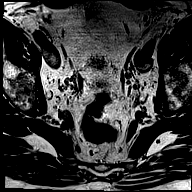

[Series 10: pre t1_twist_tra_dyn · axial · non-contrast · 3.5mm · 0.83mm/px · 1 of 26 slices shown]
[im 1/26]
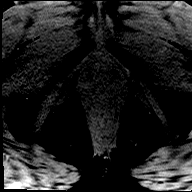

[Series 11: post t1_twist_tra_dyn-copy center · axial · non-contrast · 3.5mm · 0.83mm/px · z∈[-84,+4]mm · 18 of 780 slices shown]
[im 1/780]
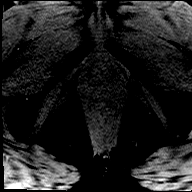
[im 46/780]
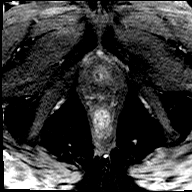
[im 92/780]
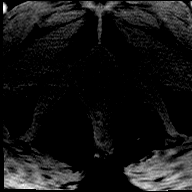
[im 138/780]
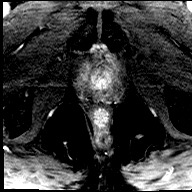
[im 184/780]
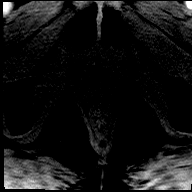
[im 230/780]
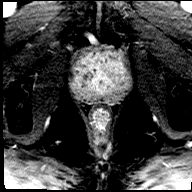
[im 275/780]
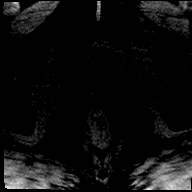
[im 321/780]
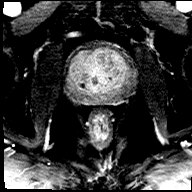
[im 367/780]
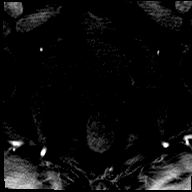
[im 413/780]
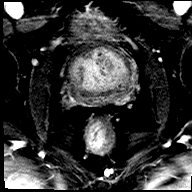
[im 459/780]
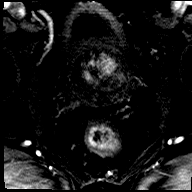
[im 505/780]
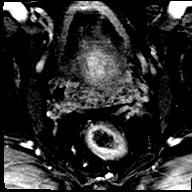
[im 550/780]
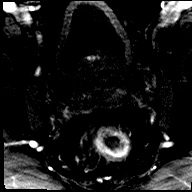
[im 596/780]
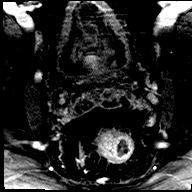
[im 642/780]
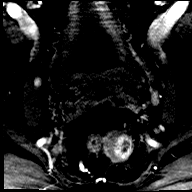
[im 688/780]
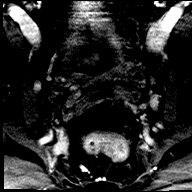
[im 734/780]
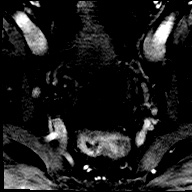
[im 780/780]
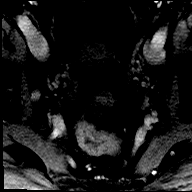

[Series 12: post t1_twist_tra_dyn-copy cent_sub · axial · 3.5mm · 0.83mm/px · z∈[-84,+4]mm · 17 of 739 slices shown]
[im 1/739]
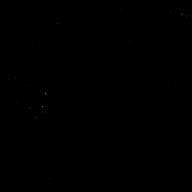
[im 47/739]
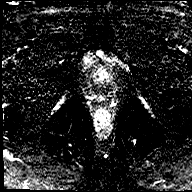
[im 93/739]
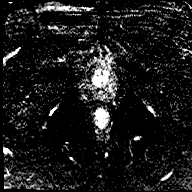
[im 139/739]
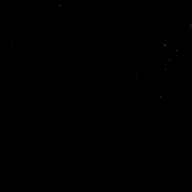
[im 185/739]
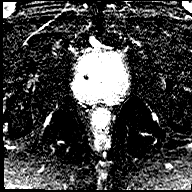
[im 231/739]
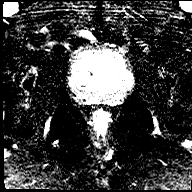
[im 277/739]
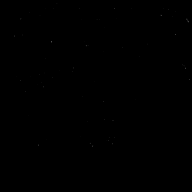
[im 323/739]
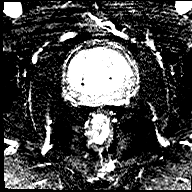
[im 370/739]
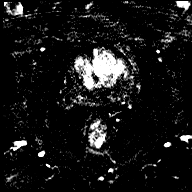
[im 416/739]
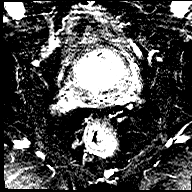
[im 462/739]
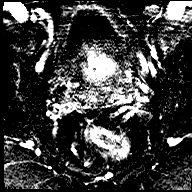
[im 508/739]
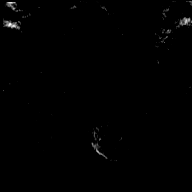
[im 554/739]
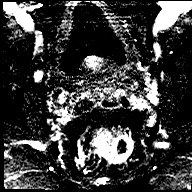
[im 600/739]
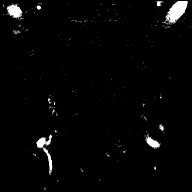
[im 646/739]
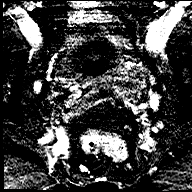
[im 692/739]
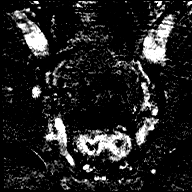
[im 739/739]
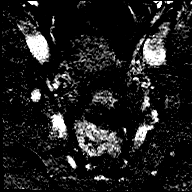

[Series 13: t1_vibe_dixon_tra_f · axial · 2.5mm · 0.91mm/px · z∈[-113,+84]mm · 2 of 80 slices shown]
[im 1/80]
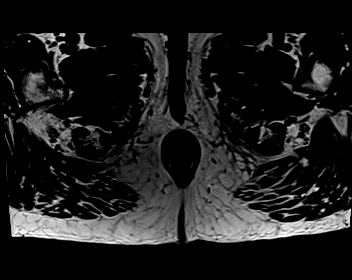
[im 80/80]
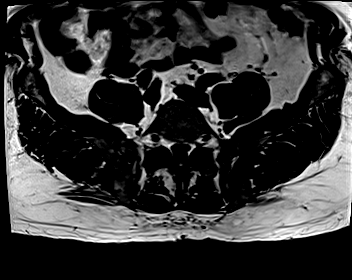

[Series 14: t1_vibe_dixon_tra_w · axial · 2.5mm · 0.91mm/px · z∈[-113,+84]mm · 2 of 80 slices shown]
[im 1/80]
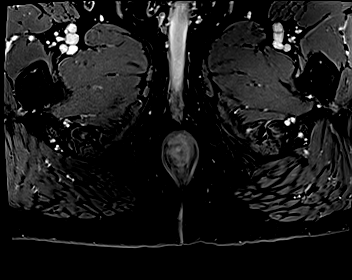
[im 80/80]
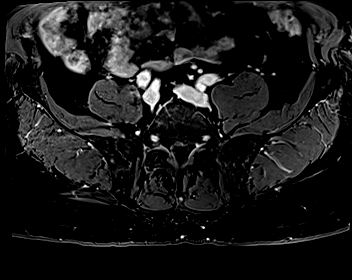

[48 of 48 positions shown; findings below may reference images not displayed]

FINDINGS: Prostate:

-- Peripheral Zone: No focal lesion seen on ADC or high b-value DWI
sequences.

-- Transition/Central Zone: Mildly enlarged with small encapsulated
BPH nodules noted; however, no suspicious nodules are identified on
T2-weighted or diffusion sequences.

-- Measurements/Volume:  6.1 x 5.1 x 6.2 cm (volume = 100 cm^3)

Transcapsular spread:  Absent

Seminal vesicle involvement:  Absent

Neurovascular bundle involvement:  Absent

Pelvic adenopathy: None visualized

Bone metastasis: None visualized

Other: Diffuse bladder wall thickening, consistent with chronic
bladder outlet obstruction. Sigmoid diverticulosis, without evidence
of diverticulitis.
IMPRESSION: No radiographic evidence of high-grade prostate carcinoma. PI-RADS 1
(v2.1): Very Low (clinically significant cancer highly unlikely)

## 2022-10-09 ENCOUNTER — Encounter: Payer: BC Managed Care – PPO | Admitting: Nurse Practitioner

## 2022-10-27 ENCOUNTER — Encounter: Payer: Self-pay | Admitting: Nurse Practitioner

## 2022-10-27 ENCOUNTER — Ambulatory Visit (INDEPENDENT_AMBULATORY_CARE_PROVIDER_SITE_OTHER): Payer: BC Managed Care – PPO | Admitting: Nurse Practitioner

## 2022-10-27 VITALS — BP 106/60 | HR 73 | Temp 97.6°F | Ht 74.0 in | Wt 239.8 lb

## 2022-10-27 DIAGNOSIS — E1122 Type 2 diabetes mellitus with diabetic chronic kidney disease: Secondary | ICD-10-CM

## 2022-10-27 DIAGNOSIS — I493 Ventricular premature depolarization: Secondary | ICD-10-CM | POA: Diagnosis not present

## 2022-10-27 DIAGNOSIS — Z1389 Encounter for screening for other disorder: Secondary | ICD-10-CM | POA: Diagnosis not present

## 2022-10-27 DIAGNOSIS — K439 Ventral hernia without obstruction or gangrene: Secondary | ICD-10-CM

## 2022-10-27 DIAGNOSIS — Z136 Encounter for screening for cardiovascular disorders: Secondary | ICD-10-CM

## 2022-10-27 DIAGNOSIS — Z0000 Encounter for general adult medical examination without abnormal findings: Secondary | ICD-10-CM | POA: Diagnosis not present

## 2022-10-27 DIAGNOSIS — Z1329 Encounter for screening for other suspected endocrine disorder: Secondary | ICD-10-CM

## 2022-10-27 DIAGNOSIS — Z13220 Encounter for screening for lipoid disorders: Secondary | ICD-10-CM

## 2022-10-27 DIAGNOSIS — E1169 Type 2 diabetes mellitus with other specified complication: Secondary | ICD-10-CM

## 2022-10-27 DIAGNOSIS — Z130 Encounter for screening for diseases of the blood and blood-forming organs and certain disorders involving the immune mechanism: Secondary | ICD-10-CM | POA: Diagnosis not present

## 2022-10-27 DIAGNOSIS — Z125 Encounter for screening for malignant neoplasm of prostate: Secondary | ICD-10-CM | POA: Diagnosis not present

## 2022-10-27 DIAGNOSIS — E559 Vitamin D deficiency, unspecified: Secondary | ICD-10-CM | POA: Diagnosis not present

## 2022-10-27 DIAGNOSIS — N401 Enlarged prostate with lower urinary tract symptoms: Secondary | ICD-10-CM | POA: Diagnosis not present

## 2022-10-27 DIAGNOSIS — I10 Essential (primary) hypertension: Secondary | ICD-10-CM | POA: Diagnosis not present

## 2022-10-27 DIAGNOSIS — E538 Deficiency of other specified B group vitamins: Secondary | ICD-10-CM

## 2022-10-27 DIAGNOSIS — E119 Type 2 diabetes mellitus without complications: Secondary | ICD-10-CM

## 2022-10-27 DIAGNOSIS — Z79899 Other long term (current) drug therapy: Secondary | ICD-10-CM | POA: Diagnosis not present

## 2022-10-27 DIAGNOSIS — Z0001 Encounter for general adult medical examination with abnormal findings: Secondary | ICD-10-CM

## 2022-10-27 DIAGNOSIS — R972 Elevated prostate specific antigen [PSA]: Secondary | ICD-10-CM

## 2022-10-27 DIAGNOSIS — E785 Hyperlipidemia, unspecified: Secondary | ICD-10-CM

## 2022-10-27 DIAGNOSIS — R350 Frequency of micturition: Secondary | ICD-10-CM | POA: Diagnosis not present

## 2022-10-27 DIAGNOSIS — I7121 Aneurysm of the ascending aorta, without rupture: Secondary | ICD-10-CM

## 2022-10-27 DIAGNOSIS — K2100 Gastro-esophageal reflux disease with esophagitis, without bleeding: Secondary | ICD-10-CM

## 2022-10-27 DIAGNOSIS — E662 Morbid (severe) obesity with alveolar hypoventilation: Secondary | ICD-10-CM

## 2022-10-27 DIAGNOSIS — Z794 Long term (current) use of insulin: Secondary | ICD-10-CM

## 2022-10-27 NOTE — Patient Instructions (Signed)

## 2022-10-27 NOTE — Progress Notes (Signed)
Complete Physical  Assessment and Plan:  Encounter for Annual Physical Exam with abnormal findings Due annually  Health Maintenance reviewed Healthy lifestyle reviewed and goals set  Essential hypertension Trending down with weight loss. Cut Benicar in 1/2 and take 1/2 dose in the AM and 1/2 dose in the PM. Continue Olmesartan-HCTZ, bASA Discussed DASH (Dietary Approaches to Stop Hypertension) DASH diet is lower in sodium than a typical American diet. Cut back on foods that are high in saturated fat, cholesterol, and trans fats. Eat more whole-grain foods, fish, poultry, and nuts Remain active and exercise as tolerated daily.  Monitor BP at home-Call if greater than 130/80.  Check and monitor CMP/CBC  PVCs Follows with cardiology- improved. 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 Elvera Lennox, Endocrinology Continue Greggory Keen, Metformin, Humalog Education: Reviewed 'ABCs' of diabetes management  Discussed goals to be met and/or maintained include A1C (<7) Blood pressure (<130/80) Cholesterol (LDL <70) Continue Eye Exam yearly  Continue Dental Exam Q6 mo Discussed dietary recommendations Discussed Physical Activity recommendations Foot exam UTD Check and monitor A1C   CKD (chronic kidney disease) stage 2 with T2DM (HCC) Stay well hydrated. Avoid high salt foods. Avoid NSAIDS. Keep BP and BG well controlled.   Take medications as prescribed. Remain active and exercise as tolerated daily. Maintain weight.  Continue to monitor. Check and monitor CMP/GFR/Microablumin  Hyperlipidemia Continue Atorvastatin. Discussed lifestyle modifications. Recommended diet heavy in fruits and veggies, omega 3's. Decrease consumption of animal meats, cheeses, and dairy products. Remain active and exercise as tolerated. Continue to monitor. Check and monitor lipids/TSH  Vitamin D deficiency Continue supplement Monitor  levels.  Gastroesophageal reflux disease with esophagitis No suspected reflux complications (Barret/stricture). Lifestyle modification:  wt loss, avoid meals 2-3h before bedtime. Consider eliminating food triggers:  chocolate, caffeine, EtOH, acid/spicy food. Continue screenings as suggested.  Hypoventilation syndrome Sleep apnea Continue CPAP Continue weight loss  Ventral hernia without obstruction or gangrene Continue to monitor  BPH (benign prostatic hypertrophy) Continue to follow with Urology PRN Continue Tamsulosin Symptoms stable Monitor PSA  Erectile dysfunction associated with type 2 diabetes mellitus Sildenafil PRN  Elevated PSA Monitor PSA Continue following with [facility] Continue to monitor  Ascending aorta aneurysm Continue following with Cardiology  Control blood pressure, cholesterol, glucose, increase exercise.   B12 Deficiency Monitor levels  Screening for ischemic heart disease Monitor EKG  Screening for thyroid disorder Monitor TSH  Screening for blood or portein in urine Monitor UA  Medication Management All medications discussed and reviewed in full. All questions and concerns regarding medications addressed.    Harry [name] defers insulin and A1c check today d/t having an updated apt with Endocrinology this week.    Orders Placed This Encounter  Procedures   CBC with Differential/Platelet   COMPLETE METABOLIC PANEL WITH GFR   Magnesium   Lipid panel   TSH   VITAMIN D 25 Hydroxy (Vit-D Deficiency, Fractures)   Urinalysis, Routine w reflex microscopic   Microalbumin / creatinine urine ratio   Vitamin B12   PSA   EKG 12-Lead    Notify office for further evaluation and treatment, questions or concerns if any reported s/s fail to improve.   The Harry [name] was advised to call back or seek an in-person evaluation if any symptoms worsen or if the condition fails to improve as anticipated.   Further disposition pending results of  labs. Discussed med's effects and SE's.    I discussed the assessment and treatment  plan with the Harry [name]. The Harry [name] was provided an opportunity to ask questions and all were answered. The Harry [name] agreed with the plan and demonstrated an understanding of the instructions.  Discussed med's effects and SE's. Screening labs and tests as requested with regular follow-up as recommended.  I provided 45 minutes of face-to-face time during this encounter including counseling, chart review, and critical decision making was preformed.  Today's Plan of Care is based on a Harry [name]-centered health care approach known as shared decision making - the decisions, tests and treatments allow for Harry [name] preferences and values to be balanced with clinical evidence.     Future Appointments  Date Time Provider Department Center  11/04/2022  3:40 PM Carlus Pavlov, MD [facility] None  12/16/2022  3:35 PM Alver Sorrow, NP [facility]  01/08/2023  1:30 PM Dohmeier, Porfirio Mylar, MD [facility] None  10/27/2023  3:00 PM Adela Glimpse, NP [facility] None     HPI 67 y.o. male Harry [name] presents for a complete physical. has Ventral hernia - periumbilical 2-3cm; Morbid obesity (HCC) - BMI 30+ with OSA; Hyperlipidemia associated with type 2 diabetes mellitus (HCC); Essential hypertension; Vitamin D deficiency; GERD (gastroesophageal reflux disease); CKD stage 2 due to type 2 diabetes mellitus (HCC); Obesity hypoventilation syndrome (HCC); BPH (benign prostatic hyperplasia); Erectile dysfunction associated with type 2 diabetes mellitus (HCC); PVC's (premature ventricular contractions); Aneurysm, ascending aorta (HCC); LADA (latent autoimmune diabetes in adults), managed as type 2 (HCC); Family history of cerebrovascular disease; and OSA on CPAP on their problem list.   Wife passed away in 02/03/Harry Harry [name] from breast cancer recurrence. He has 2 children, 2 grandkids. He works from office doing estimates.   Over all Harry [name] reports  feeling well today. He has some concern with BP dropping and feeling dizzy when changing positions or standing.  He is trending down in weight. On Ozempic for DM2 control.    BMI is Body mass index is 30.79 kg/m., he is working on diet and exercise. Wt Readings from Last 3 Encounters:  10/27/22 239 lb 12.8 oz (108.8 kg)  06/26/22 243 lb 3.2 oz (110.3 kg)  06/16/22 243 lb 3.2 oz (110.3 kg)   He has known ascending aorta dilation, It was 4.2 cm in 2016, 4.1 cm per recent ECHO 11/2017 by cardiology, plan for follow up in 2 years. He had repeat stress test and ECHO in 11/2017 which were essentially normal. Follows with Dr. Chilton Si.  His blood pressure has been controlled at home, today their BP is BP: 106/60 He does not workout. He denies chest pain, shortness of breath, dizziness.   He is on cholesterol medication (atorvastatin 40 mg daily) and denies myalgias. His cholesterol is at goal. The cholesterol last visit was:   Lab Results  Component Value Date   CHOL 138 06/16/2022   HDL 49 06/16/2022   LDLCALC 66 06/16/2022   TRIG 143 06/16/2022   CHOLHDL 2.8 06/16/2022    He has OSA on CPAP, also with obesity hypoventilation syndrome and followed by neurology; he endorses 100% compliance with restorative sleep. He has xanax script for anxiety but rare use, last filled 30 tabs 01/02/2020.    He has been working on diet for LADA managed as T2 diabetes, now has freestyle libre, followed by Dr. Elvera Lennox, he is on bASA, he is on ACE/ARB and denies foot ulcerations, increased appetite, nausea, paresthesia of the feet, polydipsia, polyuria, visual disturbances, vomiting and weight loss.   Last A1C in the office was:  Lab Results  Component Value Date   HGBA1C 6.9 (H) 06/16/2022   He has CKD II associated with T2DM. Last GFR: Lab Results  Component Value Date   GFRNONAA >60 12/27/2020    Harry [name] is on Vitamin D supplement, taking 5000 IU daily  Lab Results  Component Value Date    VD25OH 8 06/16/2022     Last PSA was: Lab Results  Component Value Date   PSA 4.1 (H) 09/21/2018   PSA 3.2 08/04/2017   PSA 3.0 06/02/2016  He was referred to [facility].  He endorses slow stream occasionally if he gets up late at night to urinate; typically 1-2 times in the evening.    Current Medications:  Current Outpatient Medications on File Prior to Visit  Medication Sig Dispense Refill   ALPRAZolam (XANAX) 1 MG tablet Take     1/2 to 1 tablet        2 to 3 x /day          as needed for Anxiety attack 30 tablet 0   aspirin 81 MG tablet Take 81 mg by mouth daily.     atorvastatin (LIPITOR) 40 MG tablet Take 1 tablet (40 mg total) by mouth daily. for cholesterol 90 tablet 3   BD INSULIN SYRINGE U/F 31G X 5/16" 0.5 ML MISC INJECT INSULIN TWICE A DAY AS DIRECTED 200 each 3   Cholecalciferol (VITAMIN D) 125 MCG (5000 UT) CAPS Take 10,000 Units by mouth daily. 60 capsule 0   Continuous Blood Gluc Sensor (DEXCOM G7 SENSOR) MISC APPLY 1 SENSOR EVERY 10 DAYS 9 each 3   Cyanocobalamin (B-12) 2500 MCG SUBL Place under the tongue daily.     finasteride (PROSCAR) 5 MG tablet Take 5 mg by mouth daily.     glucose 4 GM chewable tablet Chew 1 tablet (4 g total) by mouth as needed for low blood sugar. 30 tablet 12   glucose blood (FREESTYLE TEST STRIPS) test strip Use 1- 2 times a day with the freestyle libre 14.  CGM glucometer 150 each 3   injection device for insulin (CEQUR SIMPLICITY 2U) DEVI 1 each by Other route every 3 (three) days. 30 each 1   Injection Device for Insulin (CEQUR SIMPLICITY INSERTER) MISC Use as advised 1 each 0   insulin glargine, 2 Unit Dial, (TOUJEO MAX SOLOSTAR) 300 UNIT/ML Solostar Pen Inject 42-44 Units into the skin daily. 18 mL 3   insulin lispro (HUMALOG) 100 UNIT/ML injection INJECT under skin up to 50 units a day as advised 60 mL 3   Magnesium 500 MG TABS Take 1 tablet by mouth daily.     metFORMIN (GLUCOPHAGE-XR) 500 MG 24 hr tablet TAKE 1 TABLET TWICE A  DAY WITH MEALS FOR DIABETES 180 tablet 3   olmesartan-hydrochlorothiazide (BENICAR HCT) 40-25 MG tablet Take 1 tablet by mouth daily. 90 tablet 3   omeprazole (PRILOSEC) Harry Harry [name] MG capsule TAKE 1 CAPSULE DAILY FOR INDIGESTION AND HEARTBURN 90 capsule 3   potassium chloride SA (KLOR-CON M) Harry Harry [name] MEQ tablet TAKE 1 TABLET BY MOUTH TWICE  DAILY FOR POTASSIUM 180 tablet 3   Semaglutide, 1 MG/DOSE, 4 MG/3ML SOPN Inject 1 mg as directed once a week. 3 mL 3   sildenafil (VIAGRA) 25 MG tablet Take 25 mg by mouth daily as needed for erectile dysfunction.     tamsulosin (FLOMAX) 0.4 MG CAPS capsule Take 0.4 mg by mouth daily.     tirzepatide (MOUNJARO) 7.5 MG/0.5ML Pen Inject 7.5 mg into the skin once  a week. (Harry [name] not taking: Reported on 10/27/2022) 6 mL 3   Current Facility-Administered Medications on File Prior to Visit  Medication Dose Route Frequency Provider Last Rate Last Admin   ipratropium-albuterol (DUONEB) 0.5-2.5 (3) MG/3ML nebulizer solution 3 mL  3 mL Nebulization Once Adela Glimpse, NP       Allergies:  Allergies  Allergen Reactions   Crestor [Rosuvastatin] Cough   Health Maintenance:  Immunization History  Administered Date(s) Administered   Fluad Quad(high Dose 65+) 12/14/2020   Influenza Inj Mdck Quad With Preservative 12/19/2016   Influenza Split 11/28/2013, 12/06/2014   Influenza, High Dose Seasonal PF 12/23/2019   Influenza,inj,Quad PF,6+ Mos 12/23/2017   Influenza-Unspecified 12/19/2011, 01/04/2013, 01/01/2016, 12/23/2017   Moderna Covid-19 Vaccine Bivalent Booster 74yrs & up 03/18/2021   Moderna SARS-COV2 Booster Vaccination 07/24/2020   Moderna Sars-Covid-2 Vaccination 04/04/2019, 05/03/2019, 12/23/2019   Pneumococcal Conjugate-13 09/29/2019   Pneumococcal Polysaccharide-23 03/03/2006, 10/05/2020   Td 03/03/2008, 03/16/2018   Zoster, Live 02/07/2011   Tetanus: 03/2018 Pneumovax: 2022 Prevnar 13: 2021  Flu vaccine: 11/2021 [facility] Zostavax: 2012 - shingrix Covid  19: 2/2, 2021, moderna, booster x 3 11/2021   Colonoscopy: 12/2013 (Dr. Matthias Hughs) Had recent 2023 - 5 year recall Echo 11/2017 = has upcoming scheduled Stress test 11/2017 EGD: N/A  Eye Exam: Dr. Nile Riggs q yearly, 06/2022, no retinopathy - he will schedule soon  Dentist: Dr. Joesphine Bare, [place], last visit 2024, goes q52m Derm: Follows annually  Harry [name] Care Team: Lucky Cowboy, MD as PCP - General (Internal Medicine) Jethro Bolus, MD as Consulting Physician (Ophthalmology) Bernette Redbird, MD as Consulting Physician (Gastroenterology) Laurey Morale, MD as Consulting Physician (Cardiology) Chilton Si, MD as Attending Physician (Cardiology) Herma Mering, MD as Referring Physician (Dermatology)  Medical History:  has Ventral hernia - periumbilical 2-3cm; Morbid obesity (HCC) - BMI 30+ with OSA; Hyperlipidemia associated with type 2 diabetes mellitus (HCC); Essential hypertension; Vitamin D deficiency; GERD (gastroesophageal reflux disease); CKD stage 2 due to type 2 diabetes mellitus (HCC); Obesity hypoventilation syndrome (HCC); BPH (benign prostatic hyperplasia); Erectile dysfunction associated with type 2 diabetes mellitus (HCC); PVC's (premature ventricular contractions); Aneurysm, ascending aorta (HCC); LADA (latent autoimmune diabetes in adults), managed as type 2 (HCC); Family history of cerebrovascular disease; and OSA on CPAP on their problem list. Surgical History:  He  has a past surgical history that includes Anal fissure repair (2007); Ventral hernia repair (04/13/12); and Tonsillectomy (Bilateral). Family History:  His family history includes Atrial fibrillation in his mother; Cancer in his paternal grandfather; Cancer (age of onset: 48) in his paternal uncle; Dementia in his paternal grandmother; Kidney cancer (age of onset: 2) in his father; Stroke in his maternal grandmother. Social History:   reports that he has never smoked. He has never used smokeless tobacco.  He reports current alcohol use of about 3.0 - 5.0 standard drinks of alcohol per week. He reports that he does not use drugs. Review of Systems:  Review of Systems  Constitutional:  Negative for malaise/fatigue and weight loss.  HENT:  Positive for congestion. Negative for ear pain, hearing loss, sinus pain, sore throat and tinnitus.   Eyes:  Negative for blurred vision and double vision.  Respiratory:  Negative for cough, sputum production, shortness of breath and wheezing.   Cardiovascular:  Negative for chest pain, palpitations, orthopnea, claudication, leg swelling and PND.  Gastrointestinal:  Negative for abdominal pain, blood in stool, constipation, diarrhea, heartburn, melena, nausea and vomiting.  Genitourinary: Negative.   Musculoskeletal:  Negative for  falls, joint pain and myalgias.  Skin:  Negative for rash.  Neurological:  Negative for dizziness, tingling, sensory change, weakness and headaches.  Endo/Heme/Allergies:  Negative for polydipsia.  Psychiatric/Behavioral: Negative.  Negative for depression, memory loss, substance abuse and suicidal ideas. The Harry [name] is not nervous/anxious and does not have insomnia.   All other systems reviewed and are negative.   Physical Exam: Estimated body mass index is 30.79 kg/m as calculated from the following:   Height as of this encounter: 6\' 2"  (1.88 m).   Weight as of this encounter: 239 lb 12.8 oz (108.8 kg). BP 106/60   Pulse 73   Temp 97.6 F (36.4 C)   Ht 6\' 2"  (1.88 m)   Wt 239 lb 12.8 oz (108.8 kg)   SpO2 96%   BMI 30.79 kg/m  General Appearance: Well nourished, in no apparent distress.  Eyes: PERRLA, EOMs, conjunctiva no swelling or erythema Sinuses: No Frontal/maxillary tenderness  ENT/Mouth: Ext aud canals clear, normal light reflex with TMs without erythema, bulging. Good dentition. No erythema, swelling, or exudate on post pharynx. Tonsils not swollen or erythematous. Hearing normal.  Neck: Supple, thyroid normal. No  bruits  Respiratory: Respiratory effort normal, BS equal bilaterally without rales, rhonchi, wheezing or stridor.  Cardio: RRR without murmurs, rubs or gallops. Brisk peripheral pulses without edema.  Chest: symmetric, with normal excursions and percussion.  Abdomen: Soft, obese abdomen, nontender, no guarding, rebound, masses, or organomegaly. He has easily reduced ventral hernia.  Lymphatics: Non tender without lymphadenopathy.  Genitourinary: defer to urology Musculoskeletal: Full ROM all peripheral extremities,5/5 strength, and normal gait.  Skin: Warm, dry without rashes, ecchymosis. He has dark brown, raised 4 mm x 12 mm lesion to R mid back with crusting, irregular borders and coloring to R mid back (has seen derm and cleared). Scabbed lesion to bridge of nose.  Neuro: Cranial nerves intact, reflexes equal bilaterally. Normal muscle tone, no cerebellar symptoms. Sensation intact.  Psych: Awake and oriented X 3, normal affect, Insight and Judgment appropriate.   EKG:  NSR, IRBBB  Bessye Stith 3:56 PM [facility]

## 2022-10-28 LAB — CBC WITH DIFFERENTIAL/PLATELET
Absolute Monocytes: 529 {cells}/uL (ref 200–950)
Basophils Absolute: 88 {cells}/uL (ref 0–200)
Basophils Relative: 1.4 %
Eosinophils Absolute: 202 {cells}/uL (ref 15–500)
Eosinophils Relative: 3.2 %
HCT: 44.5 % (ref 38.5–50.0)
Hemoglobin: 15 g/dL (ref 13.2–17.1)
Lymphs Abs: 1575 {cells}/uL (ref 850–3900)
MCH: 28.6 pg (ref 27.0–33.0)
MCHC: 33.7 g/dL (ref 32.0–36.0)
MCV: 84.9 fL (ref 80.0–100.0)
MPV: 10.9 fL (ref 7.5–12.5)
Monocytes Relative: 8.4 %
Neutro Abs: 3906 {cells}/uL (ref 1500–7800)
Neutrophils Relative %: 62 %
Platelets: 217 10*3/uL (ref 140–400)
RBC: 5.24 10*6/uL (ref 4.20–5.80)
RDW: 12.8 % (ref 11.0–15.0)
Total Lymphocyte: 25 %
WBC: 6.3 10*3/uL (ref 3.8–10.8)

## 2022-10-28 LAB — URINALYSIS, ROUTINE W REFLEX MICROSCOPIC
Bilirubin Urine: NEGATIVE
Glucose, UA: NEGATIVE
Hgb urine dipstick: NEGATIVE
Ketones, ur: NEGATIVE
Leukocytes,Ua: NEGATIVE
Nitrite: NEGATIVE
Protein, ur: NEGATIVE
Specific Gravity, Urine: 1.011 (ref 1.001–1.035)
pH: <=5 (ref 5.0–8.0)

## 2022-10-28 LAB — COMPLETE METABOLIC PANEL WITH GFR
AG Ratio: 1.5 (calc) (ref 1.0–2.5)
ALT: 14 U/L (ref 9–46)
AST: 16 U/L (ref 10–35)
Albumin: 3.8 g/dL (ref 3.6–5.1)
Alkaline phosphatase (APISO): 81 U/L (ref 35–144)
BUN: 13 mg/dL (ref 7–25)
CO2: 28 mmol/L (ref 20–32)
Calcium: 9.3 mg/dL (ref 8.6–10.3)
Chloride: 101 mmol/L (ref 98–110)
Creat: 1.05 mg/dL (ref 0.70–1.35)
Globulin: 2.5 g/dL (ref 1.9–3.7)
Glucose, Bld: 110 mg/dL — ABNORMAL HIGH (ref 65–99)
Potassium: 3.3 mmol/L — ABNORMAL LOW (ref 3.5–5.3)
Sodium: 138 mmol/L (ref 135–146)
Total Bilirubin: 1 mg/dL (ref 0.2–1.2)
Total Protein: 6.3 g/dL (ref 6.1–8.1)
eGFR: 77 mL/min/{1.73_m2} (ref >=60)

## 2022-10-28 LAB — MAGNESIUM: Magnesium: 1.8 mg/dL (ref 1.5–2.5)

## 2022-10-28 LAB — LIPID PANEL
Cholesterol: 144 mg/dL (ref <200)
HDL: 53 mg/dL (ref >=40)
LDL Cholesterol (Calc): 70 mg/dL
Non-HDL Cholesterol (Calc): 91 mg/dL (ref <130)
Total CHOL/HDL Ratio: 2.7 (calc) (ref <5.0)
Triglycerides: 133 mg/dL (ref <150)

## 2022-10-28 LAB — TSH: TSH: 2.16 m[IU]/L (ref 0.40–4.50)

## 2022-10-28 LAB — MICROALBUMIN / CREATININE URINE RATIO
Creatinine, Urine: 113 mg/dL (ref 20–320)
Microalb Creat Ratio: 2 mg/g{creat} (ref <30)
Microalb, Ur: 0.2 mg/dL

## 2022-10-28 LAB — VITAMIN D 25 HYDROXY (VIT D DEFICIENCY, FRACTURES): Vit D, 25-Hydroxy: 79 ng/mL (ref 30–100)

## 2022-10-28 LAB — VITAMIN B12: Vitamin B-12: 1843 pg/mL — ABNORMAL HIGH (ref 200–1100)

## 2022-10-28 LAB — PSA: PSA: 2.78 ng/mL (ref <=4.00)

## 2022-11-04 ENCOUNTER — Encounter: Payer: Self-pay | Admitting: Internal Medicine

## 2022-11-04 ENCOUNTER — Ambulatory Visit: Payer: BC Managed Care – PPO | Admitting: Internal Medicine

## 2022-11-04 VITALS — BP 100/58 | HR 71 | Ht 74.0 in | Wt 244.2 lb

## 2022-11-04 DIAGNOSIS — E785 Hyperlipidemia, unspecified: Secondary | ICD-10-CM

## 2022-11-04 DIAGNOSIS — E139 Other specified diabetes mellitus without complications: Secondary | ICD-10-CM

## 2022-11-04 DIAGNOSIS — E1022 Type 1 diabetes mellitus with diabetic chronic kidney disease: Secondary | ICD-10-CM | POA: Diagnosis not present

## 2022-11-04 DIAGNOSIS — Z794 Long term (current) use of insulin: Secondary | ICD-10-CM

## 2022-11-04 DIAGNOSIS — E669 Obesity, unspecified: Secondary | ICD-10-CM

## 2022-11-04 DIAGNOSIS — N182 Chronic kidney disease, stage 2 (mild): Secondary | ICD-10-CM

## 2022-11-04 DIAGNOSIS — E1169 Type 2 diabetes mellitus with other specified complication: Secondary | ICD-10-CM

## 2022-11-04 DIAGNOSIS — Z7984 Long term (current) use of oral hypoglycemic drugs: Secondary | ICD-10-CM

## 2022-11-04 DIAGNOSIS — G4733 Obstructive sleep apnea (adult) (pediatric): Secondary | ICD-10-CM | POA: Diagnosis not present

## 2022-11-04 DIAGNOSIS — E1069 Type 1 diabetes mellitus with other specified complication: Secondary | ICD-10-CM | POA: Diagnosis not present

## 2022-11-04 DIAGNOSIS — Z7985 Long-term (current) use of injectable non-insulin antidiabetic drugs: Secondary | ICD-10-CM

## 2022-11-04 LAB — HEMOGLOBIN A1C: Hemoglobin A1C: 6.9

## 2022-11-04 MED ORDER — INSULIN LISPRO 100 UNIT/ML IJ SOLN
INTRAMUSCULAR | 3 refills | Status: DC
Start: 2022-11-04 — End: 2022-11-21

## 2022-11-04 MED ORDER — METFORMIN HCL ER 500 MG PO TB24: ORAL_TABLET | ORAL | 3 refills | Status: DC

## 2022-11-04 MED ORDER — CEQUR SIMPLICITY 2U DEVI
1.0000 | 1 refills | Status: DC
Start: 2022-11-04 — End: 2023-07-14

## 2022-11-04 MED ORDER — TIRZEPATIDE 7.5 MG/0.5ML ~~LOC~~ SOAJ: 7.5000 mg | SUBCUTANEOUS | 3 refills | Status: DC

## 2022-11-04 MED ORDER — DEXCOM G7 SENSOR MISC
3 refills | Status: DC
Start: 2022-11-04 — End: 2023-08-07

## 2022-11-04 MED ORDER — TOUJEO MAX SOLOSTAR 300 UNIT/ML ~~LOC~~ SOPN
44.0000 [IU] | PEN_INJECTOR | Freq: Every day | SUBCUTANEOUS | 3 refills | Status: AC
Start: 2022-11-04 — End: ?

## 2022-11-04 NOTE — Patient Instructions (Addendum)
Please continue: - Metformin ER 500 mg in am and 500 mg at night - Toujeo 44 units daily - Lispro - 15 min before meals:  5 units in am 12-20 units before lunch 12-20 units before dinner Sliding scale of Lispro: - 141-160: + 2 units  - 161-180: + 3 units  - 181-200: + 4 units  - 201-240: + 5 units  - 241-280: + 6 units  - 281-320: + 7 units  - 321-360: + 8 units  - >361: + 10 units  Try to switch back to Mounjaro 7.5 mg weekly, but if you cannot, increase Ozempic to 2 mg weekly.  Please return in 4 months.

## 2022-11-04 NOTE — Progress Notes (Signed)
Patient ID: Harry [name], male   DOB: 22-Sep-1955, 67 y.o.   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 Last visit 4 months ago. 10/01/2021: changed Insurance from [employer] to [employer].  Interim history: No increased urination, blurry vision, nausea, chest pain.   Reviewed HbA1c levels: Lab Results  Component Value Date   HGBA1C 6.9 (H) 06/16/2022   HGBA1C 6.0 (A) 02/21/2022   HGBA1C 7.4 (H) 10/23/2021   He is on: - Metformin ER 1000 >> 500 mg in am and 500 mg at night -decreased 2/2 diarrhea -now tolerated well - Mounjaro 5 >> 7.5 mg daily >> Ozempic 1 mg weekly (due to lack of availability of Mounjaro) - Toujeo 42-44 >> 44 units at bedtime - Lispro - 15 min before meals: in the Cequr pump - he likes it  5 units in am 12-15 >> 12-20 units before lunch 12-18 >> 12-20 units before dinner Correction at bedtime:4-5 units Sliding scale of Humalog >> Lispro: - 141-160: + 2 units  - 161-180: + 3 units  - 181-200: + 4 units  - 201-240: + 5 units  - 241-280: + 6 units  - 281-320: + 7 units  - 321-360: + 8 units  - >361: + 10 units He had burning at the inj site with Lyumjev. He was previously on Trulicity. He was previously on Lantus and [place].  Also, Humalog brand-name.  He checks his sugars more than 4 times a day with his CGM:  Previously:  Previously:  Previously:   Lowest sugar was 30s ... >> 52 >> 50 >> 60s; he has hypoglycemia awareness in the 60s. Highest sugar was in the HI >> ... 200s >> 400s.  Glucometer: one touch ultra 2  Pt's meals are: - Breakfast: Glucerna, banana >> oatmeal >> English muffin or boiled egg or oatmeal - Lunch: eats out - salad with chicken; chicken burrito; sandwich; chick-fil-a >> sandwich with either tomatoes or peanut butter and jelly + hold milk - Dinner: meat +  veggie + starch - Snacks: potato chips, fruit, icecream No sodas.  -+ Mild CKD, last BUN/creatinine:  Lab Results  Component Value Date   BUN 13 10/27/2022   BUN 17 06/16/2022   CREATININE 1.05 10/27/2022   CREATININE 0.94 06/16/2022   Lab Results  Component Value Date   MICRALBCREAT 2 10/27/2022   MICRALBCREAT 2 10/05/2020   MICRALBCREAT 2 09/29/2019   MICRALBCREAT 2 09/21/2018   MICRALBCREAT 7 08/04/2017   MICRALBCREAT 3 06/02/2016   MICRALBCREAT 3 04/18/2015   MICRALBCREAT 2.7 04/06/2014   MICRALBCREAT 3.3 04/06/2013  On losartan.  -+ HL; last set of lipids: Lab Results  Component Value Date   CHOL 144 10/27/2022   HDL 53 10/27/2022   LDLCALC 70 10/27/2022   TRIG 133 10/27/2022   CHOLHDL 2.7 10/27/2022  On Lipitor 40.  - last eye exam was 03/18/2022: No DR  -No numbness and tingling in his feet.  Foot exam 10/18/2021.  He also has a history of HTN, GERD, OSA-on CPAP.  His wife passed away with cancer in 04-23-2018.  ROS: + See HPI  I reviewed pt's medications, allergies, PMH, social hx, family hx, and changes were documented in the history of present illness. Otherwise, unchanged from my initial visit note.  Past Medical History:  Diagnosis Date  Diabetes mellitus without complication (HCC) 1987   insulin requiring   Elevated PSA 09/22/2018   [facility] is following    GERD (gastroesophageal reflux disease)    Hyperlipidemia    Hypertension    Hypokalemia    Hypomagnesemia    OSA on CPAP    10-12 years ago   Vitamin D deficiency    Past Surgical History:  Procedure Laterality Date   ANAL FISSURE REPAIR  2007   TONSILLECTOMY Bilateral    As a child   VENTRAL HERNIA REPAIR  04/13/12   Dr. Michaell Cowing, ventral hernia repair w/ mesh   Social History   Social History   Marital status: Married    Spouse name: N/A   Number of children: 2   Occupational History   estimator   Social History Main Topics   Smoking status: Never Smoker   Smokeless  tobacco: Not on file   Alcohol use 0.0 oz/week    2 - 5 Cans of beer per week   Current Outpatient Medications on File Prior to Visit  Medication Sig Dispense Refill   ALPRAZolam (XANAX) 1 MG tablet Take     1/2 to 1 tablet        2 to 3 x /day          as needed for Anxiety attack 30 tablet 0   aspirin 81 MG tablet Take 81 mg by mouth daily.     atorvastatin (LIPITOR) 40 MG tablet Take 1 tablet (40 mg total) by mouth daily. for cholesterol 90 tablet 3   BD INSULIN SYRINGE U/F 31G X 5/16" 0.5 ML MISC INJECT INSULIN TWICE A DAY AS DIRECTED 200 each 3   Cholecalciferol (VITAMIN D) 125 MCG (5000 UT) CAPS Take 10,000 Units by mouth daily. 60 capsule 0   Continuous Blood Gluc Sensor (DEXCOM G7 SENSOR) MISC APPLY 1 SENSOR EVERY 10 DAYS 9 each 3   Cyanocobalamin (B-12) 2500 MCG SUBL Place under the tongue daily.     finasteride (PROSCAR) 5 MG tablet Take 5 mg by mouth daily.     glucose 4 GM chewable tablet Chew 1 tablet (4 g total) by mouth as needed for low blood sugar. 30 tablet 12   glucose blood (FREESTYLE TEST STRIPS) test strip Use 1- 2 times a day with the freestyle libre 14.  CGM glucometer 150 each 3   injection device for insulin (CEQUR SIMPLICITY 2U) DEVI 1 each by Other route every 3 (three) days. 30 each 1   Injection Device for Insulin (CEQUR SIMPLICITY INSERTER) MISC Use as advised 1 each 0   insulin glargine, 2 Unit Dial, (TOUJEO MAX SOLOSTAR) 300 UNIT/ML Solostar Pen Inject 42-44 Units into the skin daily. 18 mL 3   insulin lispro (HUMALOG) 100 UNIT/ML injection INJECT under skin up to 50 units a day as advised 60 mL 3   Magnesium 500 MG TABS Take 1 tablet by mouth daily.     metFORMIN (GLUCOPHAGE-XR) 500 MG 24 hr tablet TAKE 1 TABLET TWICE A DAY WITH MEALS FOR DIABETES 180 tablet 3   olmesartan-hydrochlorothiazide (BENICAR HCT) 40-25 MG tablet Take 1 tablet by mouth daily. 90 tablet 3   omeprazole (PRILOSEC) 20 MG capsule TAKE 1 CAPSULE DAILY FOR INDIGESTION AND HEARTBURN 90  capsule 3   potassium chloride SA (KLOR-CON M) 20 MEQ tablet TAKE 1 TABLET BY MOUTH TWICE  DAILY FOR POTASSIUM 180 tablet 3   Semaglutide, 1 MG/DOSE, 4 MG/3ML SOPN Inject 1 mg as directed once  a week. 3 mL 3   sildenafil (VIAGRA) 25 MG tablet Take 25 mg by mouth daily as needed for erectile dysfunction.     tamsulosin (FLOMAX) 0.4 MG CAPS capsule Take 0.4 mg by mouth daily.     tirzepatide (MOUNJARO) 7.5 MG/0.5ML Pen Inject 7.5 mg into the skin once a week. (Patient not taking: Reported on 10/27/2022) 6 mL 3   Current Facility-Administered Medications on File Prior to Visit  Medication Dose Route Frequency Provider Last Rate Last Admin   ipratropium-albuterol (DUONEB) 0.5-2.5 (3) MG/3ML nebulizer solution 3 mL  3 mL Nebulization Once [place], Archie Patten, NP       Allergies  Allergen Reactions   Crestor [Rosuvastatin] Cough   Family History  Problem Relation Age of Onset   Atrial fibrillation Mother    Kidney cancer Father 30       kidney   Stroke Maternal Grandmother    Dementia Paternal Grandmother        Early 51s   Cancer Paternal Grandfather        Unsure of age or type   Cancer Paternal Uncle 28   PE: BP (!) 100/58   Pulse 71   Ht 6\' 2"  (1.88 m)   Wt 244 lb 3.2 oz (110.8 kg)   SpO2 96%   BMI 31.35 kg/m   Wt Readings from Last 3 Encounters:  11/04/22 244 lb 3.2 oz (110.8 kg)  10/27/22 239 lb 12.8 oz (108.8 kg)  06/26/22 243 lb 3.2 oz (110.3 kg)   Constitutional: overweight, in NAD Eyes: no exophthalmos ENT: no masses palpated in neck, no cervical lymphadenopathy Cardiovascular: RRR, No MRG Respiratory: CTA B Musculoskeletal: no deformities Skin: no rashes Neurological: no tremor with outstretched hands Diabetic Foot Exam - Simple   Simple Foot Form Diabetic Foot exam was performed with the following findings: Yes 11/04/2022  4:06 PM  Visual Inspection No deformities, no ulcerations, no other skin breakdown bilaterally: Yes Sensation Testing Intact to touch and  monofilament testing bilaterally: Yes Pulse Check Posterior Tibialis and Dorsalis pulse intact bilaterally: Yes Comments    ASSESSMENT: 1. LADA, insulin-dependent, uncontrolled, with complications - CKD stage 2 - ED  - he has an elliptical machine at home  Component     Latest Ref Rng & Units 07/11/2016  Hemoglobin A1C      6.5  Glutamic Acid Decarb Ab     <5 IU/mL 47 (H)  Pancreatic Islet Cell Antibody     <5 JDF Units <5  C-Peptide     0.80 - 3.85 ng/mL 0.37 (L)  Glucose, Fasting     65 - 99 mg/dL 409 (H)   Elevated GAD antibodies and decreased insulin production. 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 HL  3.  Obesity class I  PLAN:  1. Patient with longstanding, uncontrolled, insulin-dependent diabetes (LADA), on metformin, basal-bolus insulin regimen and weekly GLP-1/GIP receptor agonist and currently on the CeQur simplicity insulin pump.  Sugars previously improved to 6.0% but at last visit HbA1c increased to 6.9% and sugars are more fluctuating, with 35% of the values above target range, after breakfast, but more so after dinner and throughout the night.  He had less lows but he was having problems with postprandial hyperglycemia despite giving himself boluses 30 minutes before the meals.  3 to 4 hours after a meal, the sugars were starting to decrease abruptly and they continued  to decrease despite him trying to correct the lows.  He felt that this was happening more after switching from Humalog to lispro, we discussed that this type of pattern could be seen in problems with injection sites.  He was using the pump in the anterior abdomen and I advised him to move it either posteriorly on the abdomen or better on the upper thighs or arms.  He wanted to try this.  I also advised him to increase the Mounjaro dose and discussed about the possibility of needing lower doses of insulin after  this increase in dose. CGM interpretation: -At today's visit, we reviewed his CGM downloads: It appears that 61% of values are in target range (goal >70%), while 38% are higher than 180 (goal <25%), and 1% are lower than 70 (goal <4%).  The calculated average blood sugar is 170.  The projected HbA1c for the next 3 months (GMI) is 7.4%. -Reviewing the CGM trends, sugars are fluctuating in the upper half of the target range throughout the day but they increase after approximately 2 PM with a peak after approximately 9 PM.  Upon questioning, he was not able to obtain the higher dose of Mounjaro which was recommended at last visit.  Therefore, he is now on Ozempic 1 mg weekly.  We discussed that this does not appear to be enough for him and, since he changed insurance and he gets his medicines now through [employer], I sent a prescription for the 7.5 mg of Mounjaro along with the rest of his diabetic refills to this pharmacy.  I am hoping that he can obtain it through there.  I did advise him to let me know if he is not able to find it, in which case we need to call in a higher dose of Ozempic for him.  For now, I recommended to continue the same insulin doses and also the same dose of metformin. - I advised him to:  Patient Instructions  Please continue: - Metformin ER 500 mg in am and 500 mg at night - Toujeo 44 units daily - Lispro - 15 min before meals:  5 units in am 12-20 units before lunch 12-20 units before dinner Sliding scale of Lispro: - 141-160: + 2 units  - 161-180: + 3 units  - 181-200: + 4 units  - 201-240: + 5 units  - 241-280: + 6 units  - 281-320: + 7 units  - 321-360: + 8 units  - >361: + 10 units  Try to switch back to Mounjaro 7.5 mg weekly, but if you cannot, increase Ozempic to 2 mg weekly.  Please return in 4 months.    - we checked his HbA1c: 6.9% (stable) - advised to check sugars at different times of the day - 4x a day, rotating check times -  advised for yearly eye exams >> he is UTD - return to clinic in 4 months   2. HL -Plan lipid panel from a week ago showed fractions at goal: Lab Results  Component Value Date   CHOL 144 10/27/2022   HDL 53 10/27/2022   LDLCALC 70 10/27/2022   TRIG 133 10/27/2022   CHOLHDL 2.7 10/27/2022  -He continues on Lipitor 40 mg daily without side effects  3.  Obesity class I -We were able to switch from Trulicity to [facility] for stronger effect on weight and sugars.  However, he was not able to find [facility] since last visit so he is now on Ozempic.  Will try to switch back to [facility] -Weight was stable at last visit, previously lost 9 pounds; he lost 4 pounds since last visit -He gained 1 pound since last visit   Carlus Pavlov, MD PhD [facility] Endocrinology

## 2022-11-14 ENCOUNTER — Encounter: Payer: Self-pay | Admitting: Internal Medicine

## 2022-11-14 ENCOUNTER — Other Ambulatory Visit: Payer: Self-pay

## 2022-11-14 DIAGNOSIS — E119 Type 2 diabetes mellitus without complications: Secondary | ICD-10-CM

## 2022-11-20 ENCOUNTER — Other Ambulatory Visit: Payer: Self-pay

## 2022-11-20 ENCOUNTER — Telehealth: Payer: Self-pay

## 2022-11-20 ENCOUNTER — Other Ambulatory Visit: Payer: Self-pay | Admitting: Internal Medicine

## 2022-11-20 DIAGNOSIS — Z794 Long term (current) use of insulin: Secondary | ICD-10-CM

## 2022-11-20 MED ORDER — INSULIN ASPART 100 UNIT/ML IJ SOLN: 50.0000 [IU] | Freq: Three times a day (TID) | INTRAMUSCULAR | 0 refills | Status: DC

## 2022-11-20 NOTE — Telephone Encounter (Signed)
Pharmacy Patient Advocate Encounter   Received notification from CoverMyMeds that prior authorization for [facility] is required/requested.   Per test claim: PA required; PA started via CoverMyMeds. KEY [id number] . Waiting for clinical questions to populate.

## 2022-11-20 NOTE — Telephone Encounter (Signed)
[employer] is not refilling the Mounjaro 7.5 mg due to it needing prior auth, please advise

## 2022-11-20 NOTE — Telephone Encounter (Signed)
Pharmacy Patient Advocate Encounter   Received notification from CoverMyMeds that prior authorization for Dexcom G7 sensor is required/requested.   Per test claim: PA required; PA started via CoverMyMeds. KEY [id number] . Waiting for clinical questions to populate.

## 2022-11-21 ENCOUNTER — Other Ambulatory Visit: Payer: Self-pay | Admitting: Internal Medicine

## 2022-11-21 NOTE — Telephone Encounter (Signed)
Clinical info including chart notes and labs have been submitted

## 2022-11-25 DIAGNOSIS — G4733 Obstructive sleep apnea (adult) (pediatric): Secondary | ICD-10-CM | POA: Diagnosis not present

## 2022-11-26 ENCOUNTER — Other Ambulatory Visit (HOSPITAL_COMMUNITY): Payer: Self-pay

## 2022-11-26 NOTE — Telephone Encounter (Signed)
Pharmacy Patient Advocate Encounter  Received notification from [facility] that Prior Authorization for Harry [name] has been APPROVED through 11/21/2023   PA #/Case ID/Reference #: [id number]

## 2022-11-26 NOTE — Telephone Encounter (Signed)
Pharmacy Patient Advocate Encounter  Received notification from [facility] that Prior Authorization for Dexcom G7 sensor has been APPROVED through 11/21/2023   PA #/Case ID/Reference #: [id number]

## 2022-12-04 DIAGNOSIS — G4733 Obstructive sleep apnea (adult) (pediatric): Secondary | ICD-10-CM | POA: Diagnosis not present

## 2022-12-05 ENCOUNTER — Encounter: Payer: Self-pay | Admitting: Neurology

## 2022-12-08 ENCOUNTER — Encounter: Payer: Self-pay | Admitting: Nurse Practitioner

## 2022-12-08 DIAGNOSIS — I10 Essential (primary) hypertension: Secondary | ICD-10-CM

## 2022-12-08 DIAGNOSIS — K2100 Gastro-esophageal reflux disease with esophagitis, without bleeding: Secondary | ICD-10-CM

## 2022-12-08 MED ORDER — OMEPRAZOLE 20 MG PO CPDR: DELAYED_RELEASE_CAPSULE | ORAL | 3 refills | Status: DC

## 2022-12-08 MED ORDER — OLMESARTAN MEDOXOMIL-HCTZ 40-25 MG PO TABS: 1.0000 | ORAL_TABLET | Freq: Every day | ORAL | 3 refills | Status: DC

## 2022-12-08 MED ORDER — ATORVASTATIN CALCIUM 40 MG PO TABS: 40.0000 mg | ORAL_TABLET | Freq: Every day | ORAL | 3 refills | Status: DC

## 2022-12-08 MED ORDER — POTASSIUM CHLORIDE CRYS ER 20 MEQ PO TBCR: EXTENDED_RELEASE_TABLET | ORAL | 3 refills | Status: DC

## 2022-12-08 NOTE — Telephone Encounter (Signed)
Called and LVM for pt to call office back to confirm message has been received.  LVM stating that his 11/07 appt has been changed to an Initial Cpap appt due to him already receiving cpap machine and appt being within the compliance dates.

## 2022-12-15 DIAGNOSIS — I10 Essential (primary) hypertension: Secondary | ICD-10-CM | POA: Diagnosis not present

## 2022-12-15 DIAGNOSIS — G4733 Obstructive sleep apnea (adult) (pediatric): Secondary | ICD-10-CM | POA: Diagnosis not present

## 2022-12-16 ENCOUNTER — Ambulatory Visit (HOSPITAL_BASED_OUTPATIENT_CLINIC_OR_DEPARTMENT_OTHER): Payer: BC Managed Care – PPO | Admitting: Family

## 2022-12-16 ENCOUNTER — Encounter (HOSPITAL_BASED_OUTPATIENT_CLINIC_OR_DEPARTMENT_OTHER): Payer: Self-pay | Admitting: Family

## 2022-12-16 VITALS — BP 118/70 | HR 75 | Temp 97.6°F | Wt 239.6 lb

## 2022-12-16 DIAGNOSIS — I7121 Aneurysm of the ascending aorta, without rupture: Secondary | ICD-10-CM | POA: Diagnosis not present

## 2022-12-16 DIAGNOSIS — I10 Essential (primary) hypertension: Secondary | ICD-10-CM | POA: Diagnosis not present

## 2022-12-16 DIAGNOSIS — E782 Mixed hyperlipidemia: Secondary | ICD-10-CM

## 2022-12-16 MED ORDER — OLMESARTAN MEDOXOMIL-HCTZ 40-12.5 MG PO TABS
0.5000 | ORAL_TABLET | Freq: Two times a day (BID) | ORAL | 3 refills | Status: DC
Start: 2022-12-16 — End: 2023-11-11

## 2022-12-16 NOTE — Patient Instructions (Addendum)
Medication Instructions:  Your physician has recommended you make the following change in your medication:   CHANGE Olmesartan-hydrochlorothiazide to 40-12.5mg  tablet : Take half tablet twice daily  *If you need a refill on your cardiac medications before your next appointment, please call your pharmacy*   Testing/Procedures: We will plan for echocardiogram in 2 years for monitoring of your aortic dilation which was stable compared to previous on your recent echocardiogram. Prevent this from worsening by keeping your blood pressure well controlled.   Follow-Up: At [facility], you and your health needs are our priority.  As part of our continuing mission to provide you with exceptional heart care, we have created designated Provider Care Teams.  These Care Teams include your primary Cardiologist (physician) and Advanced Practice Providers (APPs -  Physician Assistants and Nurse Practitioners) who all work together to provide you with the care you need, when you need it.  We recommend signing up for the patient portal called "MyChart".  Sign up information is provided on this After Visit Summary.  MyChart is used to connect with patients for Virtual Visits (Telemedicine).  Patients are able to view lab/test results, encounter notes, upcoming appointments, etc.  Non-urgent messages can be sent to your provider as well.   To learn more about what you can do with MyChart, go to [email or web address].    Your next appointment:   2 year(s)  Provider:   Chilton Si, MD or Gillian Shields, NP    Other Instructions  Heart Healthy Diet Recommendations: A low-salt diet is recommended. Meats should be grilled, baked, or boiled. Avoid fried foods. Focus on lean protein sources like fish or chicken with vegetables and fruits. The American Heart Association is a Chief Technology Officer!  American Heart Association Diet and Lifeystyle Recommendations   Exercise recommendations: The American Heart  Association recommends 150 minutes of moderate intensity exercise weekly. Try 30 minutes of moderate intensity exercise 4-5 times per week. This could include walking, jogging, or swimming.

## 2022-12-16 NOTE — Progress Notes (Signed)
Cardiology Office Note:  .   Date:  12/19/2022  ID:  Harry [name], DOB 01/07/1956, MRN [id number] PCP: Adela Glimpse, NP  [facility] Providers Cardiologist:  Chilton Si, MD    History of Present Illness: .   Harry [name] is a 67 y.o. male with a history of mild ascending aorta aneurysm, hypertension, hyperlipidemia, OSA on CPAP, PVC, diabetes.  Deviously patient of Dr. Shirlee Latch having since transition to Dr. Duke Salvia.  Initially referred due to abnormal EKG and exertional dyspnea.  Myoview 05/2015 negative for ischemia but did show PVCs and short runs of NSVT.  Echo 05/2014 LVEF 60 to 60%, grade 1 diastolic dysfunction, mild ascending aortic aneurysm 4.2 cm.  Ascending aortic aneurysm has undergone serial monitoring since that time.  11/2017 ascending aorta 41 mm ? 12/2020 ascending aorta 42 mm ? 09/2022 ascending aorta 40 mm  Presents today for follow-up independently.  Since last seen and enjoyed seeing some shows at [employer] in [place].  Previously was experiencing some hypotension with lightheadedness with position changes which has improved since olmesartan-hydrochlorothiazide was split to half tablet twice daily.  Has had persistent relatively low blood pressures at home.  Likely caused by successful weight loss on Mounjaro.  ROS: Please see the history of present illness.    All other systems reviewed and are negative.   Studies Reviewed: .        Cardiac Studies & Procedures     STRESS TESTS  MYOCARDIAL PERFUSION IMAGING 11/12/2017  Narrative  The left ventricular ejection fraction is normal (55-65%).  Nuclear stress EF: 57%.  This is a low risk study.  There was no ST segment deviation noted during stress.  The study is normal.  Low risk stress nuclear study with normal perfusion and normal left ventricular regional and global systolic function.   ECHOCARDIOGRAM  ECHOCARDIOGRAM COMPLETE 09/24/2022  Narrative ECHOCARDIOGRAM REPORT    Patient  Name:   Harry [name] Date of Exam: 09/24/2022 Medical Rec #:  [id number]      Height:       74.0 in Accession #:    [id number]     Weight:       243.2 lb Date of Birth:  06-24-1955      BSA:          2.362 m Patient Age:    67 years       BP:           95/54 mmHg Patient Gender: M              HR:           75 bpm. Exam Location:  Outpatient  Procedure: 2D Echo, 3D Echo, Color Doppler, Cardiac Doppler and Strain Analysis  Indications:    Ascending Aortic Aneurysm  History:        Patient has prior history of Echocardiogram examinations. Arrythmias:PVC; Risk Factors:Hypertension, Diabetes, Dyslipidemia and Non-Smoker.  Sonographer:    Jeryl Columbia RDCS Referring Phys: [id number] TIFFANY Gresham  IMPRESSIONS   1. Left ventricular ejection fraction, by estimation, is 70 to 75%. The left ventricle has hyperdynamic function. The left ventricle has no regional wall motion abnormalities. There is mild concentric left ventricular hypertrophy. Left ventricular diastolic parameters are consistent with Grade I diastolic dysfunction (impaired relaxation). 2. Right ventricular systolic function is normal. The right ventricular size is normal. Tricuspid regurgitation signal is inadequate for assessing PA pressure. 3. The mitral valve is normal in structure. Trivial mitral valve regurgitation.  4. The aortic valve is tricuspid. There is mild calcification of the aortic valve. There is mild thickening of the aortic valve. Aortic valve regurgitation is not visualized. Aortic valve sclerosis/calcification is present, without any evidence of aortic stenosis. 5. Aortic dilatation noted. There is borderline dilatation of the aortic root, measuring 38 mm. There is mild dilatation of the ascending aorta, measuring 40 mm. 6. The inferior vena cava is normal in size with greater than 50% respiratory variability, suggesting right atrial pressure of 3 mmHg.  Comparison(s): Prior TTE in 12/2020 with EF 60-65%,  Ascending aorta 42mm.  FINDINGS Left Ventricle: Left ventricular ejection fraction, by estimation, is 70 to 75%. The left ventricle has hyperdynamic function. The left ventricle has no regional wall motion abnormalities. Global longitudinal strain performed but not reported based on interpreter judgement due to suboptimal tracking. 3D ejection fraction reviewed and evaluated as part of the interpretation. Alternate measurement of EF is felt to be most reflective of LV function. The left ventricular internal cavity size was normal in size. There is mild concentric left ventricular hypertrophy. Left ventricular diastolic parameters are consistent with Grade I diastolic dysfunction (impaired relaxation).  Right Ventricle: The right ventricular size is normal. No increase in right ventricular wall thickness. Right ventricular systolic function is normal. Tricuspid regurgitation signal is inadequate for assessing PA pressure.  Left Atrium: Left atrial size was normal in size.  Right Atrium: Right atrial size was normal in size.  Pericardium: There is no evidence of pericardial effusion.  Mitral Valve: The mitral valve is normal in structure. Trivial mitral valve regurgitation.  Tricuspid Valve: The tricuspid valve is normal in structure. Tricuspid valve regurgitation is not demonstrated.  Aortic Valve: The aortic valve is tricuspid. There is mild calcification of the aortic valve. There is mild thickening of the aortic valve. Aortic valve regurgitation is not visualized. Aortic valve sclerosis/calcification is present, without any evidence of aortic stenosis.  Pulmonic Valve: The pulmonic valve was normal in structure. Pulmonic valve regurgitation is trivial.  Aorta: Aortic dilatation noted. There is borderline dilatation of the aortic root, measuring 38 mm. There is mild dilatation of the ascending aorta, measuring 40 mm.  Venous: The inferior vena cava is normal in size with greater than 50%  respiratory variability, suggesting right atrial pressure of 3 mmHg.  IAS/Shunts: The atrial septum is grossly normal.   LEFT VENTRICLE PLAX 2D LVIDd:         4.18 cm   Diastology LVIDs:         2.42 cm   LV e' medial:    4.35 cm/s LV PW:         1.48 cm   LV E/e' medial:  16.1 LV IVS:        1.25 cm   LV e' lateral:   5.22 cm/s LVOT diam:     2.10 cm   LV E/e' lateral: 13.4 LV SV:         81 LV SV Index:   34 LVOT Area:     3.46 cm  3D Volume EF: 3D EF:        60 % LV EDV:       102 ml LV ESV:       41 ml LV SV:        61 ml  RIGHT VENTRICLE RV Basal diam:  4.53 cm RV Mid diam:    3.68 cm RV S prime:     14.30 cm/s TAPSE (M-mode): 1.8 cm  LEFT  ATRIUM             Index        RIGHT ATRIUM           Index LA diam:        3.60 cm 1.52 cm/m   RA Area:     16.00 cm LA Vol (A2C):   56.2 ml 23.80 ml/m  RA Volume:   39.30 ml  16.64 ml/m LA Vol (A4C):   53.0 ml 22.44 ml/m LA Biplane Vol: 58.4 ml 24.73 ml/m AORTIC VALVE LVOT Vmax:   124.00 cm/s LVOT Vmean:  81.700 cm/s LVOT VTI:    0.234 m  AORTA Ao Root diam: 3.80 cm  MITRAL VALVE MV Area (PHT): 3.08 cm    SHUNTS MV Decel Time: 246 msec    Systemic VTI:  0.23 m MV E velocity: 70.20 cm/s  Systemic Diam: 2.10 cm MV A velocity: 88.80 cm/s MV E/A ratio:  0.79  Harry Flatten MD Electronically signed by Harry Flatten MD Signature Date/Time: 09/24/2022/9:26:07 PM    Final    MONITORS  LONG TERM MONITOR (3-14 DAYS) 07/21/2022  Narrative Patch Wear Time:  13 days and 22 hours (2024-04-27T10:06:08-398 to 2024-05-11T08:13:33-0400)  Patient had a min HR of 55 bpm, max HR of 218 bpm, and avg HR of 77 bpm. Predominant underlying rhythm was Sinus Rhythm.  EVENTS: 2 Ventricular Tachycardia runs occurred, the run with the fastest interval lasting 6 beats with a max rate of 218 bpm (avg 141 bpm); the run with the fastest interval was also the longest.  13 Supraventricular Tachycardia runs occurred, the run  with the fastest interval lasting 8 beats with a max rate of 218 bpm, the longest lasting 15 beats with an avg rate of 135 bpm.  Isolated SVEs were rare (<1.0%), SVE Couplets were rare (<1.0%), and SVE Triplets were rare (<1.0%).  Isolated VEs were rare (<1.0%, 1208), VE Couplets were rare (<1.0%, 18), and VE Triplets were rare (<1.0%, 8). Ventricular Bigeminy and Trigeminy were present.  No atrial fibrillation, sustained ventricular tachyarrhythmias, or bradyarrhythmias were detected.  Patient triggered events corresponded with sinus rhythm and PVCs.           Risk Assessment/Calculations:            Physical Exam:   VS:  BP 118/70 (BP Location: Right Arm, Patient Position: Sitting, Cuff Size: Normal)   Pulse 75   Temp 97.6 F (36.4 C) (Temporal)   Wt 239 lb 9.6 oz (108.7 kg)   SpO2 96%   BMI 30.76 kg/m    Wt Readings from Last 3 Encounters:  12/16/22 239 lb 9.6 oz (108.7 kg)  11/04/22 244 lb 3.2 oz (110.8 kg)  10/27/22 239 lb 12.8 oz (108.8 kg)    GEN: Well nourished, well developed in no acute distress NECK: No JVD; No carotid bruits CARDIAC: RRR, no murmurs, rubs, gallops RESPIRATORY:  Clear to auscultation without rales, wheezing or rhonchi  ABDOMEN: Soft, non-tender, non-distended EXTREMITIES:  No edema; No deformity   ASSESSMENT AND PLAN: .    HTN -now with hypotension associate with lightheadedness likely due to successful weight loss.  Will change olmesartan-HCTZ from 40-25mg  tablet to 40-12.5 mg tablet.  He will continue taking half tablet twice per day.  He will contact our office if he has persistent hypotension for which could be transition to olmesartan without hydrochlorothiazide component.  Ascending aortic aneurysm -stable since 2016.  Most recent echo 09/2018 440 mm.  Repeat echo in 2  years for monitoring.  Continue optimal BP control, as above.  Would recommend avoidance of fluoroquinolones.  HLD - Continue Atorvastatin 40mg  daily.       Dispo: follow up  in 2 years  Signed, Alver Sorrow, NP

## 2022-12-18 ENCOUNTER — Telehealth (HOSPITAL_BASED_OUTPATIENT_CLINIC_OR_DEPARTMENT_OTHER): Payer: Self-pay | Admitting: Family

## 2022-12-18 NOTE — Telephone Encounter (Signed)
Mychart message to patient, called and went to voicemail

## 2022-12-18 NOTE — Telephone Encounter (Signed)
Hello,  I called patient this morning to get him scheduled for his follow up in 2 months for an office visit. Patient stated that wasn't correct. So I followed up with Rutherford Guys just to clarify that the AVS was correct. AVS was correct which wanted him to come back in 2 years for an Echocardiogram and a 2 month follow in the office. When calling the patient back to get him scheduled. He stated that he would like Walker to give him a call before he schedule the appointment.   Thanks

## 2022-12-18 NOTE — Telephone Encounter (Signed)
I will call him, but just want to confirm that AVS is correct, 73mo fu and echo in 2 years?

## 2022-12-18 NOTE — Telephone Encounter (Signed)
It is echo in 2 years and f/u after echo in 2 years. No 2 mos f/u needed.   Alver Sorrow, NP

## 2022-12-19 ENCOUNTER — Encounter (HOSPITAL_BASED_OUTPATIENT_CLINIC_OR_DEPARTMENT_OTHER): Payer: Self-pay | Admitting: Family

## 2022-12-29 ENCOUNTER — Encounter: Payer: Self-pay | Admitting: Nurse Practitioner

## 2022-12-29 ENCOUNTER — Ambulatory Visit (INDEPENDENT_AMBULATORY_CARE_PROVIDER_SITE_OTHER): Payer: BC Managed Care – PPO | Admitting: Nurse Practitioner

## 2022-12-29 ENCOUNTER — Ambulatory Visit: Payer: BC Managed Care – PPO | Admitting: Nurse Practitioner

## 2022-12-29 VITALS — BP 100/76 | HR 78 | Temp 97.7°F | Ht 74.0 in | Wt 235.0 lb

## 2022-12-29 DIAGNOSIS — N401 Enlarged prostate with lower urinary tract symptoms: Secondary | ICD-10-CM

## 2022-12-29 DIAGNOSIS — M545 Low back pain, unspecified: Secondary | ICD-10-CM

## 2022-12-29 DIAGNOSIS — R3912 Poor urinary stream: Secondary | ICD-10-CM | POA: Diagnosis not present

## 2022-12-29 DIAGNOSIS — R350 Frequency of micturition: Secondary | ICD-10-CM

## 2022-12-29 DIAGNOSIS — R351 Nocturia: Secondary | ICD-10-CM

## 2022-12-29 MED ORDER — CYCLOBENZAPRINE HCL 10 MG PO TABS: ORAL_TABLET | ORAL | 0 refills | Status: DC

## 2022-12-29 NOTE — Patient Instructions (Signed)
 Kidney Stones Kidney stones are rock-like masses that form inside of the kidneys. Kidneys are organs that make pee (urine). A kidney stone may move into other parts of the urinary tract, including: The tubes that connect the kidneys to the bladder (ureters). The bladder. The tube that carries urine out of the body (urethra). Kidney stones can cause very bad pain and can block the flow of pee. The stone usually leaves your body through your pee. A doctor may need to take out the stone. What are the causes? Kidney stones may be caused by: Too much calcium in the body. This may be caused by too much parathyroid hormone in the blood. Uric acid crystals in the bladder. The body makes uric acid when you eat certain foods. Narrowing of one or both of the ureters. A kidney blockage that you were born with. Past surgery on the kidney or the ureters. What increases the risk? You are more likely to develop this condition if: You have had a kidney stone in the past. Other people in your family have had kidney stones. You do not drink enough water. You eat a diet that is high in protein, salt (sodium), or sugar. You are very overweight (obese). What are the signs or symptoms? Symptoms of a kidney stone may include: Pain in the side of the belly, right below the ribs. Pain usually spreads to the groin. Needing to pee often or right away. Pain when peeing. Blood in your pee. Feeling like you may vomit (nauseous). Vomiting. Fever and chills. How is this treated? Treatment depends on the size, location, and makeup of the kidney stones. The stones will often pass out of the body when you pee. You may need to: Drink more fluid to help pass the stone. In some cases, you may be given fluids through an IV tube at the hospital. Take medicine for pain. Change your diet to help keep kidney stones from coming back. Sometimes, you may need: A procedure to break up kidney stones using a beam of light (laser)  or shock waves. Surgery to remove the kidney stones. Follow these instructions at home: Medicines Take over-the-counter and prescription medicines only as told by your doctor. Ask your doctor if the medicine prescribed to you requires you to avoid driving or using machinery. Eating and drinking Drink enough fluid to keep your pee pale yellow. You may be told to drink at least 8-10 glasses of water each day. This will help you pass the stone. If told by your doctor, change your diet. You may be told to: Limit how much salt you eat. Eat more fruits and vegetables. Limit how much meat, poultry, fish, and eggs you eat. Follow instructions from your doctor about what you may eat and drink. General instructions Collect pee samples as told by your doctor. You may need to collect a pee sample: 24 hours after a stone comes out. 8-12 weeks after a stone comes out, and every 6-12 months after that. Strain your pee every time you pee. Use the strainer that your doctor recommends. Do not throw out the stone. Keep it so that it can be tested by your doctor. Keep all follow-up visits. You may need X-rays and ultrasounds to make sure the stone has come out. How is this prevented? To prevent another kidney stone: Drink enough fluid to keep your pee pale yellow. This is the best way to prevent kidney stones. Eat healthy foods. Avoid certain foods as told by your doctor. You  may be told to eat less protein. Stay at a healthy weight. Where to find more information National Kidney Foundation (NKF): kidney.org [facility]): urologyhealth.org Contact a doctor if: You have pain that gets worse or does not get better with medicine. Get help right away if: You have a fever or chills. You get very bad pain. You get new pain in your belly. You faint. You cannot pee. This information is not intended to replace advice given to you by your health care provider. Make sure you discuss any  questions you have with your health care provider. Document Revised: 10/11/2021 Document Reviewed: 10/11/2021 Elsevier Patient Education  2024 [employer].

## 2022-12-29 NOTE — Progress Notes (Signed)
Assessment and Plan:  Harry [name] was seen today for an episodic visit.  Diagnoses and all order for this visit:  Acute left-sided low back pain without sciatica/Urinary frequency Assess for possible nephrolithiasis versus muscle strain - does have reports of increase in weak urinary stream with frequency. Start Flexeril as directed for management of pain - continue to monitor. If UA/Cx negative discussed further work up with lumbar x-ray  - CBC with Differential/Platelet - COMPLETE METABOLIC PANEL WITH GFR - Urinalysis, Routine w reflex microscopic - Urine Culture - cyclobenzaprine (FLEXERIL) 10 MG tablet; Take 1/2 to 1 tablet 3 x /day as needed for Muscle Spasm  Dispense: 30 tablet; Refill: 0  Weak urinary stream/Benign prostatic hyperplasia with nocturia  - Urinalysis, Routine w reflex microscopic - Urine Culture - PSA  Notify office for further evaluation and treatment, questions or concerns if s/s fail to improve. The risks and benefits of my recommendations, as well as other treatment options were discussed with the patient today. Questions were answered.  Further disposition pending results of labs. Discussed med's effects and SE's.    Over 20 minutes of exam, counseling, chart review, and critical decision making was performed.   Future Appointments  Date Time Provider Department Center  01/08/2023  1:30 PM Dohmeier, Porfirio Mylar, MD [facility] None  02/03/2023  3:30 PM Adela Glimpse, NP [facility] None  03/06/2023  3:20 PM Carlus Pavlov, MD [facility] None  10/27/2023  3:00 PM Gilmer Kaminsky, Archie Patten, NP [facility] None    ------------------------------------------------------------------------------------------------------------------   HPI BP 100/76   Pulse 78   Temp 97.7 F (36.5 C)   Ht 6\' 2"  (1.88 m)   Wt 235 lb (106.6 kg)   SpO2 99%   BMI 30.17 kg/m   67 y.o.male presents for evaluation of left side flank pain that radiates to groin.  Pain is sometimes noticed on  right flank.  No rash or URI symptoms.  States pain is most felt when laying down at night.  Does not feel as much when laying supine however if laying on left or right side feels like a dull muscle ache.  Has noticed increase in weak stream and urinary frequency.  No hematuria.  No hx of nephrolithiasis.  He has BPH currently treated with Tamsulosin 0.8 mg and Finasteride 5 mg, occasional Sildenafil 25 mg.  Denies any recent fall, trauma or injury to that area.  Denies fever, chills, N/V. Feels it may be a muscle strain and the pain feels as though it deep inside the back.  Has tried Tylenol without relief.    Past Medical History:  Diagnosis Date   Diabetes mellitus without complication (HCC) 1987   insulin requiring   Elevated PSA 09/22/2018   [facility] is following    GERD (gastroesophageal reflux disease)    Hyperlipidemia    Hypertension    Hypokalemia    Hypomagnesemia    OSA on CPAP    10-12 years ago   Vitamin D deficiency      Allergies  Allergen Reactions   Crestor [Rosuvastatin] Cough    Current Outpatient Medications on File Prior to Visit  Medication Sig   ALPRAZolam (XANAX) 1 MG tablet Take     1/2 to 1 tablet        2 to 3 x /day          as needed for Anxiety attack   aspirin 81 MG tablet Take 81 mg by mouth daily.   atorvastatin (LIPITOR) 40 MG tablet Take  1 tablet (40 mg total) by mouth daily. for cholesterol   BD INSULIN SYRINGE U/F 31G X 5/16" 0.5 ML MISC INJECT INSULIN TWICE A DAY AS DIRECTED   Cholecalciferol (VITAMIN D) 125 MCG (5000 UT) CAPS Take 10,000 Units by mouth daily.   Continuous Glucose Sensor (DEXCOM G7 SENSOR) MISC Use 1 sensor every 10 days   Cyanocobalamin (B-12) 2500 MCG SUBL Place under the tongue daily.   finasteride (PROSCAR) 5 MG tablet Take 5 mg by mouth daily.   glucose 4 GM chewable tablet Chew 1 tablet (4 g total) by mouth as needed for low blood sugar.   glucose blood (FREESTYLE TEST STRIPS) test strip Use 1- 2 times a day with  the freestyle libre 14.  CGM glucometer   injection device for insulin (CEQUR SIMPLICITY 2U) DEVI 1 each by Other route every 3 (three) days.   Injection Device for Insulin (CEQUR SIMPLICITY INSERTER) MISC Use as advised   insulin aspart (NOVOLOG) 100 UNIT/ML injection Inject 50 units under the skin three times daily before meals.   insulin glargine, 2 Unit Dial, (TOUJEO MAX SOLOSTAR) 300 UNIT/ML Solostar Pen Inject 44 Units into the skin daily.   Magnesium 500 MG TABS Take 1 tablet by mouth daily.   metFORMIN (GLUCOPHAGE-XR) 500 MG 24 hr tablet TAKE 1 TABLET TWICE A DAY WITH MEALS FOR DIABETES   olmesartan-hydrochlorothiazide (BENICAR HCT) 40-12.5 MG tablet Take 0.5 tablets by mouth 2 (two) times daily.   omeprazole (PRILOSEC) 20 MG capsule TAKE 1 CAPSULE DAILY FOR INDIGESTION AND HEARTBURN   potassium chloride SA (KLOR-CON M) 20 MEQ tablet TAKE 1 TABLET BY MOUTH TWICE  DAILY FOR POTASSIUM   Semaglutide, 1 MG/DOSE, 4 MG/3ML SOPN Inject 1 mg as directed once a week.   sildenafil (VIAGRA) 25 MG tablet Take 25 mg by mouth daily as needed for erectile dysfunction.   tamsulosin (FLOMAX) 0.4 MG CAPS capsule Take 0.4 mg by mouth daily.   tirzepatide (MOUNJARO) 7.5 MG/0.5ML Pen Inject 7.5 mg into the skin once a week.   Current Facility-Administered Medications on File Prior to Visit  Medication   ipratropium-albuterol (DUONEB) 0.5-2.5 (3) MG/3ML nebulizer solution 3 mL    ROS: all negative except what is noted in the HPI.   Physical Exam:  BP 100/76   Pulse 78   Temp 97.7 F (36.5 C)   Ht 6\' 2"  (1.88 m)   Wt 235 lb (106.6 kg)   SpO2 99%   BMI 30.17 kg/m   General Appearance: NAD.  Awake, conversant and cooperative. Eyes: PERRLA, EOMs intact.  Sclera white.  Conjunctiva without erythema. Sinuses: No frontal/maxillary tenderness.  No nasal discharge. Nares patent.  ENT/Mouth: Ext aud canals clear.  Bilateral TMs w/DOL and without erythema or bulging. Hearing intact.  Posterior pharynx  without swelling or exudate.  Tonsils without swelling or erythema.  Neck: Supple.  No masses, nodules or thyromegaly. Respiratory: Effort is regular with non-labored breathing. Breath sounds are equal bilaterally without rales, rhonchi, wheezing or stridor.  Cardio: RRR with no MRGs. Brisk peripheral pulses without edema.  Abdomen: Active BS in all four quadrants.  Soft and non-tender without guarding, rebound tenderness, hernias or masses. Lymphatics: Non tender without lymphadenopathy.  Musculoskeletal: Full ROM, 5/5 strength, normal ambulation.  No clubbing or cyanosis. Skin: Appropriate color for ethnicity. Warm without rashes, lesions, ecchymosis, ulcers.  Neuro: CN II-XII grossly normal. Normal muscle tone without cerebellar symptoms and intact sensation.   Psych: AO X 3,  appropriate mood and affect,  insight and judgment.     Adela Glimpse, NP 4:40 PM [facility]

## 2022-12-30 LAB — CBC WITH DIFFERENTIAL/PLATELET
Absolute Lymphocytes: 1435 {cells}/uL (ref 850–3900)
Absolute Monocytes: 518 {cells}/uL (ref 200–950)
Basophils Absolute: 97 {cells}/uL (ref 0–200)
Basophils Relative: 1.4 %
Eosinophils Absolute: 152 {cells}/uL (ref 15–500)
Eosinophils Relative: 2.2 %
HCT: 44.9 % (ref 38.5–50.0)
Hemoglobin: 14.8 g/dL (ref 13.2–17.1)
MCH: 28.2 pg (ref 27.0–33.0)
MCHC: 33 g/dL (ref 32.0–36.0)
MCV: 85.7 fL (ref 80.0–100.0)
MPV: 10.6 fL (ref 7.5–12.5)
Monocytes Relative: 7.5 %
Neutro Abs: 4699 {cells}/uL (ref 1500–7800)
Neutrophils Relative %: 68.1 %
Platelets: 221 10*3/uL (ref 140–400)
RBC: 5.24 10*6/uL (ref 4.20–5.80)
RDW: 12.4 % (ref 11.0–15.0)
Total Lymphocyte: 20.8 %
WBC: 6.9 10*3/uL (ref 3.8–10.8)

## 2022-12-30 LAB — URINALYSIS, ROUTINE W REFLEX MICROSCOPIC
Bilirubin Urine: NEGATIVE
Glucose, UA: NEGATIVE
Hgb urine dipstick: NEGATIVE
Leukocytes,Ua: NEGATIVE
Nitrite: NEGATIVE
Protein, ur: NEGATIVE
Specific Gravity, Urine: 1.026 (ref 1.001–1.035)
pH: 5.5 (ref 5.0–8.0)

## 2022-12-30 LAB — COMPLETE METABOLIC PANEL WITH GFR
AG Ratio: 1.7 (calc) (ref 1.0–2.5)
ALT: 12 U/L (ref 9–46)
AST: 14 U/L (ref 10–35)
Albumin: 4 g/dL (ref 3.6–5.1)
Alkaline phosphatase (APISO): 81 U/L (ref 35–144)
BUN: 18 mg/dL (ref 7–25)
CO2: 26 mmol/L (ref 20–32)
Calcium: 9.3 mg/dL (ref 8.6–10.3)
Chloride: 99 mmol/L (ref 98–110)
Creat: 1.25 mg/dL (ref 0.70–1.35)
Globulin: 2.4 g/dL (ref 1.9–3.7)
Glucose, Bld: 138 mg/dL — ABNORMAL HIGH (ref 65–99)
Potassium: 3.7 mmol/L (ref 3.5–5.3)
Sodium: 138 mmol/L (ref 135–146)
Total Bilirubin: 1.2 mg/dL (ref 0.2–1.2)
Total Protein: 6.4 g/dL (ref 6.1–8.1)
eGFR: 63 mL/min/{1.73_m2} (ref >=60)

## 2022-12-30 LAB — URINE CULTURE
MICRO NUMBER:: 15652391
Result:: NO GROWTH
SPECIMEN QUALITY:: ADEQUATE

## 2022-12-30 LAB — PSA: PSA: 2.86 ng/mL (ref <=4.00)

## 2023-01-01 ENCOUNTER — Encounter: Payer: Self-pay | Admitting: Nurse Practitioner

## 2023-01-04 DIAGNOSIS — G4733 Obstructive sleep apnea (adult) (pediatric): Secondary | ICD-10-CM | POA: Diagnosis not present

## 2023-01-08 ENCOUNTER — Encounter: Payer: Self-pay | Admitting: Nurse Practitioner

## 2023-01-08 ENCOUNTER — Encounter: Payer: Self-pay | Admitting: Neurology

## 2023-01-08 ENCOUNTER — Ambulatory Visit: Payer: BC Managed Care – PPO | Admitting: Neurology

## 2023-01-08 VITALS — BP 126/84 | HR 82 | Ht 73.0 in | Wt 238.0 lb

## 2023-01-08 DIAGNOSIS — G4733 Obstructive sleep apnea (adult) (pediatric): Secondary | ICD-10-CM

## 2023-01-08 NOTE — Progress Notes (Signed)
Provider:  Melvyn Novas, MD  Primary Care Physician:  Adela Glimpse, NP [address] [place] [address]     Referring Provider: Lucky Cowboy, Md [address] [place],  [place] [address]          Chief Complaint according to patient   Patient presents with:     RV on CPAP after Sleep study, Patient (Initial new CPAP Visit)           HISTORY OF PRESENT ILLNESS:  Harry [name] is a 67 y.o. male patient who is here for revisit 01/08/2023 for  CPAP compliance.   Chief concern according to patient : Mr. Tabb received a new CPAP but it is the same model he has used for over 5 years prior.  So he has no trouble with the CPAP machine knows how to use it.  He is an experienced user and he had seen me in February of this year as his old machine needed supplies and needed replacement.  At the time while on CPAP his Epworth score was 4 out of 24 points his fatigue severity score was 41 out of 63 points and he had endorsed a GDS at 3 out of 15.  He had a home sleep test with a little under 6 hours of recorded sleep and an overall AHI of 44/h.  So this qualifies for severe sleep apnea.  What was concerning is that most apneas were in non-REM sleep significantly more than in REM sleep.  The REM sleep AHI was under 10 an hour and non-REM sleep AHI was 50/h.  Following CMS criteria it would have been a REM AHI of 2.4 and a CMS based AHI of 36.8 in non-REM sleep.   He also had many more apneas when he slept on his back.    He started with his new CPAP - S 10 auto titration device between a setting of 6 and 16 cm water and 3 cm expiratory pressure relief.   His residual AHI is 1.1/h which is excellent he does not have central apneas arising, the 95th percentile pressure is only 8.7 cm water and he has very little air leakage.  He is 100% compliant patient by hours and days with an average of 9 hours 20 minutes each night.      His last visit in our  clinic was on 2-18 2020 with Latrelle Dodrill , NP, who stated that he had excellent compliance data . His compliance was 100% for days and time his residual AHI was 1.7/h and at that time his Epworth sleepiness score had been reduced to 3 out of 24 points.   Compliance report now shows again 100% compliance for 30 out of 30 days over 4 hours each night with an average use time of 9 hours 27 minutes.   Review of Systems: Out of a complete 14 system review, the patient complains of only the following symptoms, and all other reviewed systems are negative.:  Fatigue, sleepiness , snoring, fragmented sleep, Insomnia, RLS, Nocturia    How likely are you to doze in the following situations: 0 = not likely, 1 = slight chance, 2 = moderate chance, 3 = high chance   Sitting and Reading? Watching Television? Sitting inactive in a public place (theater or meeting)? As a passenger in a car for an hour without a break? Lying down in the afternoon when circumstances permit? Sitting and talking to  someone? Sitting quietly after lunch without alcohol? In a car, while stopped for a few minutes in traffic?   Total = 3/ 24 points   FSS endorsed at 35/ 63 points.   GDS 2/ 15   Social History   Socioeconomic History   Marital status: Widowed    Spouse name: Not on file   Number of children: 2   Years of education: Not on file   Highest education level: Not on file  Occupational History   Not on file  Tobacco Use   Smoking status: Never   Smokeless tobacco: Never  Vaping Use   Vaping status: Never Used  Substance and Sexual Activity   Alcohol use: Yes    Alcohol/week: 3.0 - 5.0 standard drinks of alcohol    Types: 3 - 5 Standard drinks or equivalent per week    Comment: weekly   Drug use: No   Sexual activity: Yes    Partners: Female    Birth control/protection: Post-menopausal  Other Topics Concern   Not on file  Social History Narrative   Not on file   Social Determinants of Health    Financial Resource Strain: Not on file  Food Insecurity: Not on file  Transportation Needs: Not on file  Physical Activity: Insufficiently Active (08/04/2017)   Exercise Vital Sign    Days of Exercise per Week: 1 day    Minutes of Exercise per Session: 50 min  Stress: No Stress Concern Present (09/21/2018)   [employer] of Occupational Health - Occupational Stress Questionnaire    Feeling of Stress : Only a little  Social Connections: Not on file    Family History  Problem Relation Age of Onset   Atrial fibrillation Mother    Kidney cancer Father 58       kidney   Stroke Maternal Grandmother    Dementia Paternal Grandmother        Early 68s   Cancer Paternal Grandfather        Unsure of age or type   Cancer Paternal Uncle 62    Past Medical History:  Diagnosis Date   Diabetes mellitus without complication (HCC) 1987   insulin requiring   Elevated PSA 09/22/2018   [facility] is following    GERD (gastroesophageal reflux disease)    Hyperlipidemia    Hypertension    Hypokalemia    Hypomagnesemia    OSA on CPAP    10-12 years ago   Vitamin D deficiency     Past Surgical History:  Procedure Laterality Date   ANAL FISSURE REPAIR  2007   TONSILLECTOMY Bilateral    As a child   VENTRAL HERNIA REPAIR  04/13/12   Dr. Michaell Cowing, ventral hernia repair w/ mesh     Current Outpatient Medications on File Prior to Visit  Medication Sig Dispense Refill   ALPRAZolam (XANAX) 1 MG tablet Take     1/2 to 1 tablet        2 to 3 x /day          as needed for Anxiety attack 30 tablet 0   aspirin 81 MG tablet Take 81 mg by mouth daily.     atorvastatin (LIPITOR) 40 MG tablet Take 1 tablet (40 mg total) by mouth daily. for cholesterol 90 tablet 3   BD INSULIN SYRINGE U/F 31G X 5/16" 0.5 ML MISC INJECT INSULIN TWICE A DAY AS DIRECTED 200 each 3   Cholecalciferol (VITAMIN D) 125 MCG (5000 UT) CAPS Take  10,000 Units by mouth daily. 60 capsule 0   Continuous Glucose Sensor (DEXCOM  G7 SENSOR) MISC Use 1 sensor every 10 days 9 each 3   Cyanocobalamin (B-12) 2500 MCG SUBL Place under the tongue daily.     cyclobenzaprine (FLEXERIL) 10 MG tablet Take 1/2 to 1 tablet 3 x /day as needed for Muscle Spasm 30 tablet 0   finasteride (PROSCAR) 5 MG tablet Take 5 mg by mouth daily.     glucose 4 GM chewable tablet Chew 1 tablet (4 g total) by mouth as needed for low blood sugar. 30 tablet 12   glucose blood (FREESTYLE TEST STRIPS) test strip Use 1- 2 times a day with the freestyle libre 14.  CGM glucometer 150 each 3   injection device for insulin (CEQUR SIMPLICITY 2U) DEVI 1 each by Other route every 3 (three) days. 30 each 1   Injection Device for Insulin (CEQUR SIMPLICITY INSERTER) MISC Use as advised 1 each 0   insulin aspart (NOVOLOG) 100 UNIT/ML injection Inject 50 units under the skin three times daily before meals. 40 mL 11   insulin glargine, 2 Unit Dial, (TOUJEO MAX SOLOSTAR) 300 UNIT/ML Solostar Pen Inject 44 Units into the skin daily. 18 mL 3   Magnesium 500 MG TABS Take 1 tablet by mouth daily.     metFORMIN (GLUCOPHAGE-XR) 500 MG 24 hr tablet TAKE 1 TABLET TWICE A DAY WITH MEALS FOR DIABETES 180 tablet 3   olmesartan-hydrochlorothiazide (BENICAR HCT) 40-12.5 MG tablet Take 0.5 tablets by mouth 2 (two) times daily. 90 tablet 3   omeprazole (PRILOSEC) 20 MG capsule TAKE 1 CAPSULE DAILY FOR INDIGESTION AND HEARTBURN 90 capsule 3   potassium chloride SA (KLOR-CON M) 20 MEQ tablet TAKE 1 TABLET BY MOUTH TWICE  DAILY FOR POTASSIUM 180 tablet 3   sildenafil (VIAGRA) 25 MG tablet Take 25 mg by mouth daily as needed for erectile dysfunction.     tamsulosin (FLOMAX) 0.4 MG CAPS capsule Take 0.4 mg by mouth daily.     tirzepatide (MOUNJARO) 7.5 MG/0.5ML Pen Inject 7.5 mg into the skin once a week. 6 mL 3   Current Facility-Administered Medications on File Prior to Visit  Medication Dose Route Frequency Provider Last Rate Last Admin   ipratropium-albuterol (DUONEB) 0.5-2.5 (3)  MG/3ML nebulizer solution 3 mL  3 mL Nebulization Once Adela Glimpse, NP        Allergies  Allergen Reactions   Crestor [Rosuvastatin] Cough     DIAGNOSTIC DATA (LABS, IMAGING, TESTING) - I reviewed patient records, labs, notes, testing and imaging myself where available.  Lab Results  Component Value Date   WBC 6.9 12/29/2022   HGB 14.8 12/29/2022   HCT 44.9 12/29/2022   MCV 85.7 12/29/2022   PLT 221 12/29/2022      Component Value Date/Time   NA 138 12/29/2022 1548   K 3.7 12/29/2022 1548   CL 99 12/29/2022 1548   CO2 26 12/29/2022 1548   GLUCOSE 138 (H) 12/29/2022 1548   BUN 18 12/29/2022 1548   CREATININE 1.25 12/29/2022 1548   CALCIUM 9.3 12/29/2022 1548   PROT 6.4 12/29/2022 1548   ALBUMIN 3.7 06/02/2016 1541   AST 14 12/29/2022 1548   ALT 12 12/29/2022 1548   ALKPHOS 85 06/02/2016 1541   BILITOT 1.2 12/29/2022 1548   GFRNONAA >60 12/27/2020 1014   GFRNONAA 83 04/23/2020 1212   GFRAA 96 04/23/2020 1212   Lab Results  Component Value Date   CHOL 144 10/27/2022  HDL 53 10/27/2022   LDLCALC 70 10/27/2022   TRIG 133 10/27/2022   CHOLHDL 2.7 10/27/2022   Lab Results  Component Value Date   HGBA1C 6.9 (H) 06/16/2022   Lab Results  Component Value Date   VITAMINB12 1,843 (H) 10/27/2022   Lab Results  Component Value Date   TSH 2.16 10/27/2022    PHYSICAL EXAM:  Today's Vitals   01/08/23 1315  BP: 126/84  Pulse: 82  Weight: 238 lb (108 kg)  Height: 6\' 1"  (1.854 m)   Body mass index is 31.4 kg/m.   Wt Readings from Last 3 Encounters:  01/08/23 238 lb (108 kg)  12/29/22 235 lb (106.6 kg)  12/16/22 239 lb 9.6 oz (108.7 kg)     Ht Readings from Last 3 Encounters:  01/08/23 6\' 1"  (1.854 m)  12/29/22 6\' 2"  (1.88 m)  11/04/22 6\' 2"  (1.88 m)      General: The patient is awake, alert and appears not in acute distress.  Head: Normocephalic, atraumatic. Neck is supple. Mallampati 2, unshaven. neck circumference:18 inches . Nasal airflow   patent.  Retrognathia is  seen.  Dental status: biological  Cardiovascular:  Regular rate and cardiac rhythm by pulse,  without distended neck veins. Respiratory: Lungs are clear to auscultation.  Skin: facial erythema,  Without evidence of ankle edema, or rash. Trunk: The patient's posture is erect.   Neurologic exam : The patient is awake and alert, oriented to place and time.   Memory subjective described as intact.  Attention span & concentration ability appears normal.  Speech is fluent,  without  dysarthria, dysphonia or aphasia.  Mood and affect are appropriate.   Cranial nerves: no loss of smell or taste reported  Pupils are equal and briskly reactive to light. Extraocular movements in vertical and horizontal planes were intact and without nystagmus. No Diplopia. Visual fields by finger perimetry are intact. Hearing was intact to soft voice and finger rubbing.    Facial sensation intact to fine touch.  Facial motor strength is symmetric and tongue and uvula move midline.  Neck ROM : rotation, tilt and flexion extension were normal for age and shoulder shrug was symmetrical.    Motor exam:  Symmetric bulk, tone and ROM.   Normal tone without cog -wheeling, symmetric grip strength .   Sensory:  Fine touch, pinprick and vibration were tested  and  normal.  Proprioception tested in the upper extremities was normal. Deep tendon reflexes: in the  upper and lower extremities are symmetric and intact.  Babinski response was deferred .    ASSESSMENT AND PLAN 67 y.o. year old male OSA patient here with:    1) CPAP dependency in , at baseline, severe OSA, but with a very unusual NREM sleep dominated apnea.   This device is successfully controlling his apnea and he has been 100 % compliant.   2) there was no break in CPAP therapy , the patient fluently transitioned to the new device . He also reports that congestion , if any, will be alleviated by CPAP.   3) NO change in settings,  RV once a year through our NP . He has been followed by cardiology for an aortic dilation.   Mr. Sustaita had a question about obtaining a travel CPAP and I will be happy to write him a prescription that he can use online or with his DME however he prefers this setting will be 9 cm water pressure, the equivalent of the 95th percentile pressure  used at this current daily CPAP.  I would try to obtain a CPAP with the same maker [employer] so that the data can be confluent on 1 report.  In addition we discussed briefly inspire and the patient has a significant and not apnea hypopnea index.  He has a BMI that is above 30 however, he did have an oxygen nadir at 83% and had 4 minutes of low oxygen total.  The there was no bradycardia noted with his form of sleep.  I explained how the device would work and that it is considered successful treatment if he achieves a result a resident not resolution of apnea hypopnea but a reduction by 60 or 70% max.  I would like to thank Adela Glimpse, NP , Chilton Si, MD, and Lucky Cowboy, Md [address] [place],  [place] [address] for allowing me to meet with and to take care of this pleasant patient.    After spending a total time of  20  minutes face to face and additional time for physical and neurologic examination, review of laboratory studies,  personal review of imaging studies, reports and results of other testing and review of referral information / records as far as provided in visit,   Electronically signed by: Melvyn Novas, MD 01/08/2023 2:03 PM  [facility] Board certified by The [employer] of Sleep Medicine and Diplomate of the [employer] of Sleep Medicine. Board certified In Neurology through the ABPN, Fellow of the [employer] of Neu[employer].

## 2023-01-12 MED ORDER — PREDNISONE 10 MG PO TABS
ORAL_TABLET | ORAL | 0 refills | Status: DC
Start: 2023-01-12 — End: 2023-02-03

## 2023-02-03 ENCOUNTER — Ambulatory Visit: Payer: BC Managed Care – PPO | Admitting: Nurse Practitioner

## 2023-02-03 ENCOUNTER — Encounter: Payer: Self-pay | Admitting: Nurse Practitioner

## 2023-02-03 VITALS — BP 118/80 | HR 80 | Temp 97.9°F | Ht 74.0 in | Wt 241.0 lb

## 2023-02-03 DIAGNOSIS — E119 Type 2 diabetes mellitus without complications: Secondary | ICD-10-CM

## 2023-02-03 DIAGNOSIS — N182 Chronic kidney disease, stage 2 (mild): Secondary | ICD-10-CM

## 2023-02-03 DIAGNOSIS — E1122 Type 2 diabetes mellitus with diabetic chronic kidney disease: Secondary | ICD-10-CM | POA: Diagnosis not present

## 2023-02-03 DIAGNOSIS — E559 Vitamin D deficiency, unspecified: Secondary | ICD-10-CM

## 2023-02-03 DIAGNOSIS — R972 Elevated prostate specific antigen [PSA]: Secondary | ICD-10-CM

## 2023-02-03 DIAGNOSIS — E538 Deficiency of other specified B group vitamins: Secondary | ICD-10-CM

## 2023-02-03 DIAGNOSIS — E785 Hyperlipidemia, unspecified: Secondary | ICD-10-CM

## 2023-02-03 DIAGNOSIS — K439 Ventral hernia without obstruction or gangrene: Secondary | ICD-10-CM

## 2023-02-03 DIAGNOSIS — R351 Nocturia: Secondary | ICD-10-CM

## 2023-02-03 DIAGNOSIS — I7121 Aneurysm of the ascending aorta, without rupture: Secondary | ICD-10-CM

## 2023-02-03 DIAGNOSIS — G4733 Obstructive sleep apnea (adult) (pediatric): Secondary | ICD-10-CM | POA: Diagnosis not present

## 2023-02-03 DIAGNOSIS — I493 Ventricular premature depolarization: Secondary | ICD-10-CM | POA: Diagnosis not present

## 2023-02-03 DIAGNOSIS — I10 Essential (primary) hypertension: Secondary | ICD-10-CM | POA: Diagnosis not present

## 2023-02-03 DIAGNOSIS — K2100 Gastro-esophageal reflux disease with esophagitis, without bleeding: Secondary | ICD-10-CM

## 2023-02-03 DIAGNOSIS — E662 Morbid (severe) obesity with alveolar hypoventilation: Secondary | ICD-10-CM

## 2023-02-03 DIAGNOSIS — E1169 Type 2 diabetes mellitus with other specified complication: Secondary | ICD-10-CM

## 2023-02-03 DIAGNOSIS — Z794 Long term (current) use of insulin: Secondary | ICD-10-CM

## 2023-02-03 DIAGNOSIS — Z79899 Other long term (current) drug therapy: Secondary | ICD-10-CM

## 2023-02-03 DIAGNOSIS — N401 Enlarged prostate with lower urinary tract symptoms: Secondary | ICD-10-CM

## 2023-02-03 NOTE — Patient Instructions (Signed)

## 2023-02-03 NOTE — Progress Notes (Signed)
Follow Up  Assessment and Plan:  Essential hypertension Well controlled on current medications. Discussed DASH (Dietary Approaches to Stop Hypertension) DASH diet is lower in sodium than a typical American diet. Cut back on foods that are high in saturated fat, cholesterol, and trans fats. Eat more whole-grain foods, fish, poultry, and nuts Remain active and exercise as tolerated daily.  Monitor BP at home-Call if greater than 130/80.  Check and monitor CMP/CBC  PVCs Follows with cardiology- improved. 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 Elvera Lennox, Endocrinology Continue Greggory Keen, Metformin, Humalog Education: Reviewed 'ABCs' of diabetes management  Discussed goals to be met and/or maintained include A1C (<7) Blood pressure (<130/80) Cholesterol (LDL <70) Continue Eye Exam yearly  Continue Dental Exam Q6 mo Discussed dietary recommendations Discussed Physical Activity recommendations Foot exam UTD Check and monitor A1C  CKD (chronic kidney disease) stage 2 with T2DM (HCC) Stay well hydrated. Avoid high salt foods. Avoid NSAIDS. Keep BP and BG well controlled.   Take medications as prescribed. Remain active and exercise as tolerated daily. Maintain weight.  Continue to monitor. Check and monitor CMP/GFR/Microablumin  Hyperlipidemia Continue Atorvastatin. Discussed lifestyle modifications. Recommended diet heavy in fruits and veggies, omega 3's. Decrease consumption of animal meats, cheeses, and dairy products. Remain active and exercise as tolerated. Continue to monitor. Check and monitor lipids/TSH  Vitamin D deficiency Continue supplement Monitor levels.  Gastroesophageal reflux disease with esophagitis No suspected reflux complications (Barret/stricture). Lifestyle modification:  wt loss, avoid meals 2-3h before bedtime. Consider eliminating food triggers:  chocolate, caffeine, EtOH, acid/spicy food. Continue  screenings as suggested.  Hypoventilation syndrome Sleep apnea Continue CPAP Continue weight loss  Ventral hernia without obstruction or gangrene Continue to monitor  BPH (benign prostatic hypertrophy) Continue to follow with Urology PRN Continue Tamsulosin Symptoms stable Monitor PSA  Erectile dysfunction associated with type 2 diabetes mellitus Sildenafil PRN  Elevated PSA Monitor PSA Continue following with [facility] Continue to monitor  Ascending aorta aneurysm Continue following with Cardiology  Control blood pressure, cholesterol, glucose, increase exercise.   B12 Deficiency Monitor levels  Medication Management All medications discussed and reviewed in full. All questions and concerns regarding medications addressed.       Notify office for further evaluation and treatment, questions or concerns if any reported s/s fail to improve.   The patient was advised to call back or seek an in-person evaluation if any symptoms worsen or if the condition fails to improve as anticipated.   Further disposition pending results of labs. Discussed med's effects and SE's.    I discussed the assessment and treatment plan with the patient. The patient was provided an opportunity to ask questions and all were answered. The patient agreed with the plan and demonstrated an understanding of the instructions.  Discussed med's effects and SE's. Screening labs and tests as requested with regular follow-up as recommended.  I provided 30 minutes of face-to-face time during this encounter including counseling, chart review, and critical decision making was preformed.  Today's Plan of Care is based on a patient-centered health care approach known as shared decision making - the decisions, tests and treatments allow for patient preferences and values to be balanced with clinical evidence.     Future Appointments  Date Time Provider Department Center  03/06/2023  3:20 PM Carlus Pavlov, MD [id number] None  10/27/2023  3:00 PM Adela Glimpse, NP [facility] None  01/14/2024  2:30 PM Dohmeier, Porfirio Mylar, MD [facility] None  HPI 67 y.o. male patient presents for a general follow up. has Ventral hernia - periumbilical 2-3cm; Morbid obesity (HCC) - BMI 30+ with OSA; Hyperlipidemia associated with type 2 diabetes mellitus (HCC); Essential hypertension; Vitamin D deficiency; GERD (gastroesophageal reflux disease); CKD stage 2 due to type 2 diabetes mellitus (HCC); Obesity hypoventilation syndrome (HCC); BPH (benign prostatic hyperplasia); Erectile dysfunction associated with type 2 diabetes mellitus (HCC); PVC's (premature ventricular contractions); Aneurysm, ascending aorta (HCC); LADA (latent autoimmune diabetes in adults), managed as type 2 (HCC); Family history of cerebrovascular disease; and Severe obstructive sleep apnea-hypopnea syndrome on their problem list.   Wife passed away in 04-29-18 from breast cancer recurrence. He has 2 children, 2 grandkids. He works from office doing estimates.   Over all patient reports feeling well today. He has no new concerns at this time.   BMI is Body mass index is 30.94 kg/m., he is working on diet and exercise. Wt Readings from Last 3 Encounters:  02/03/23 241 lb (109.3 kg)  01/08/23 238 lb (108 kg)  12/29/22 235 lb (106.6 kg)   He has known ascending aorta dilation, It was 4.2 cm in 2016, 4.1 cm per recent ECHO 11/2017 by cardiology, plan for follow up in 2 years. He had repeat stress test and ECHO in 11/2017 which were essentially normal. Follows with Dr. Chilton Si.  His blood pressure has been controlled at home, today their BP is BP: 118/80 He does not workout. He denies chest pain, shortness of breath, dizziness.   He is on cholesterol medication (atorvastatin 40 mg daily) and denies myalgias. His cholesterol is at goal. The cholesterol last visit was:   Lab Results  Component Value Date   CHOL 144 10/27/2022   HDL 53  10/27/2022   LDLCALC 70 10/27/2022   TRIG 133 10/27/2022   CHOLHDL 2.7 10/27/2022    He has OSA on CPAP, also with obesity hypoventilation syndrome and followed by neurology; he endorses 100% compliance with restorative sleep. He has xanax script for anxiety but rare use, last filled 30 tabs 01/02/2020.    He has been working on diet for LADA managed as T2 diabetes, now has freestyle libre, followed by Dr. Elvera Lennox, he is on bASA, he is on ACE/ARB and denies foot ulcerations, increased appetite, nausea, paresthesia of the feet, polydipsia, polyuria, visual disturbances, vomiting and weight loss.   Last A1C in the office was:  Lab Results  Component Value Date   HGBA1C 6.9 (H) 06/16/2022   He has CKD II associated with T2DM. Last GFR: Lab Results  Component Value Date   GFRNONAA >60 12/27/2020    Patient is on Vitamin D supplement, taking 5000 IU daily  Lab Results  Component Value Date   VD25OH 50 10/27/2022     Last PSA was: Lab Results  Component Value Date   PSA 2.86 12/29/2022   PSA 2.78 10/27/2022   PSA 4.1 (H) 09/21/2018  He was referred to [facility].  He endorses slow stream occasionally if he gets up late at night to urinate; typically 1-2 times in the evening.    Current Medications:  Current Outpatient Medications on File Prior to Visit  Medication Sig Dispense Refill   ALPRAZolam (XANAX) 1 MG tablet Take     1/2 to 1 tablet        2 to 3 x /day          as needed for Anxiety attack 30 tablet 0   aspirin 81 MG tablet Take  81 mg by mouth daily.     atorvastatin (LIPITOR) 40 MG tablet Take 1 tablet (40 mg total) by mouth daily. for cholesterol 90 tablet 3   BD INSULIN SYRINGE U/F 31G X 5/16" 0.5 ML MISC INJECT INSULIN TWICE A DAY AS DIRECTED 200 each 3   Cholecalciferol (VITAMIN D) 125 MCG (5000 UT) CAPS Take 10,000 Units by mouth daily. 60 capsule 0   Continuous Glucose Sensor (DEXCOM G7 SENSOR) MISC Use 1 sensor every 10 days 9 each 3   Cyanocobalamin (B-12)  2500 MCG SUBL Place under the tongue daily.     cyclobenzaprine (FLEXERIL) 10 MG tablet Take 1/2 to 1 tablet 3 x /day as needed for Muscle Spasm 30 tablet 0   finasteride (PROSCAR) 5 MG tablet Take 5 mg by mouth daily.     glucose 4 GM chewable tablet Chew 1 tablet (4 g total) by mouth as needed for low blood sugar. 30 tablet 12   glucose blood (FREESTYLE TEST STRIPS) test strip Use 1- 2 times a day with the freestyle libre 14.  CGM glucometer 150 each 3   injection device for insulin (CEQUR SIMPLICITY 2U) DEVI 1 each by Other route every 3 (three) days. 30 each 1   Injection Device for Insulin (CEQUR SIMPLICITY INSERTER) MISC Use as advised 1 each 0   insulin aspart (NOVOLOG) 100 UNIT/ML injection Inject 50 units under the skin three times daily before meals. 40 mL 11   insulin glargine, 2 Unit Dial, (TOUJEO MAX SOLOSTAR) 300 UNIT/ML Solostar Pen Inject 44 Units into the skin daily. 18 mL 3   Magnesium 500 MG TABS Take 1 tablet by mouth daily.     metFORMIN (GLUCOPHAGE-XR) 500 MG 24 hr tablet TAKE 1 TABLET TWICE A DAY WITH MEALS FOR DIABETES 180 tablet 3   olmesartan-hydrochlorothiazide (BENICAR HCT) 40-12.5 MG tablet Take 0.5 tablets by mouth 2 (two) times daily. 90 tablet 3   omeprazole (PRILOSEC) 20 MG capsule TAKE 1 CAPSULE DAILY FOR INDIGESTION AND HEARTBURN 90 capsule 3   potassium chloride SA (KLOR-CON M) 20 MEQ tablet TAKE 1 TABLET BY MOUTH TWICE  DAILY FOR POTASSIUM 180 tablet 3   sildenafil (VIAGRA) 25 MG tablet Take 25 mg by mouth daily as needed for erectile dysfunction.     tamsulosin (FLOMAX) 0.4 MG CAPS capsule Take 0.4 mg by mouth daily.     tirzepatide (MOUNJARO) 7.5 MG/0.5ML Pen Inject 7.5 mg into the skin once a week. 6 mL 3   predniSONE (DELTASONE) 10 MG tablet 1 tab 3 x day for 2 days, then 1 tab 2 x day for 2 days, then 1 tab 1 x day for 3 days 13 tablet 0   Current Facility-Administered Medications on File Prior to Visit  Medication Dose Route Frequency Provider Last Rate  Last Admin   ipratropium-albuterol (DUONEB) 0.5-2.5 (3) MG/3ML nebulizer solution 3 mL  3 mL Nebulization Once Adela Glimpse, NP       Allergies:  Allergies  Allergen Reactions   Crestor [Rosuvastatin] Cough   Health Maintenance:  Immunization History  Administered Date(s) Administered   Fluad Quad(high Dose 65+) 12/14/2020   Influenza Inj Mdck Quad With Preservative 12/19/2016   Influenza Split 11/28/2013, 12/06/2014   Influenza, High Dose Seasonal PF 12/23/2019   Influenza,inj,Quad PF,6+ Mos 12/23/2017   Influenza-Unspecified 12/19/2011, 01/04/2013, 01/01/2016, 12/23/2017   Moderna Covid-19 Vaccine Bivalent Booster 92yrs & up 03/18/2021   Moderna SARS-COV2 Booster Vaccination 07/24/2020   Moderna Sars-Covid-2 Vaccination 04/04/2019, 05/03/2019, 12/23/2019  Pneumococcal Conjugate-13 09/29/2019   Pneumococcal Polysaccharide-23 03/03/2006, 10/05/2020   Td 03/03/2008, 03/16/2018   Zoster, Live 02/07/2011    Patient Care Team: Adela Glimpse, NP as PCP - General (Nurse Practitioner) Chilton Si, MD as PCP - Cardiology (Cardiology) Jethro Bolus, MD as Consulting Physician (Ophthalmology) Bernette Redbird, MD as Consulting Physician (Gastroenterology) Laurey Morale, MD as Consulting Physician (Cardiology) Herma Mering, MD as Referring Physician (Dermatology)  Medical History:  has Ventral hernia - periumbilical 2-3cm; Morbid obesity (HCC) - BMI 30+ with OSA; Hyperlipidemia associated with type 2 diabetes mellitus (HCC); Essential hypertension; Vitamin D deficiency; GERD (gastroesophageal reflux disease); CKD stage 2 due to type 2 diabetes mellitus (HCC); Obesity hypoventilation syndrome (HCC); BPH (benign prostatic hyperplasia); Erectile dysfunction associated with type 2 diabetes mellitus (HCC); PVC's (premature ventricular contractions); Aneurysm, ascending aorta (HCC); LADA (latent autoimmune diabetes in adults), managed as type 2 (HCC); Family history of  cerebrovascular disease; and Severe obstructive sleep apnea-hypopnea syndrome on their problem list. Surgical History:  He  has a past surgical history that includes Anal fissure repair (2007); Ventral hernia repair (04/13/12); and Tonsillectomy (Bilateral). Family History:  His family history includes Atrial fibrillation in his mother; Cancer in his paternal grandfather; Cancer (age of onset: 41) in his paternal uncle; Dementia in his paternal grandmother; Kidney cancer (age of onset: 31) in his father; Stroke in his maternal grandmother. Social History:   reports that he has never smoked. He has never used smokeless tobacco. He reports current alcohol use of about 3.0 - 5.0 standard drinks of alcohol per week. He reports that he does not use drugs. Review of Systems:  Review of Systems  Constitutional:  Negative for malaise/fatigue and weight loss.  HENT:  Positive for congestion. Negative for ear pain, hearing loss, sinus pain, sore throat and tinnitus.   Eyes:  Negative for blurred vision and double vision.  Respiratory:  Negative for cough, sputum production, shortness of breath and wheezing.   Cardiovascular:  Negative for chest pain, palpitations, orthopnea, claudication, leg swelling and PND.  Gastrointestinal:  Negative for abdominal pain, blood in stool, constipation, diarrhea, heartburn, melena, nausea and vomiting.  Genitourinary: Negative.   Musculoskeletal:  Negative for falls, joint pain and myalgias.  Skin:  Negative for rash.  Neurological:  Negative for dizziness, tingling, sensory change, weakness and headaches.  Endo/Heme/Allergies:  Negative for polydipsia.  Psychiatric/Behavioral: Negative.  Negative for depression, memory loss, substance abuse and suicidal ideas. The patient is not nervous/anxious and does not have insomnia.   All other systems reviewed and are negative.   Physical Exam: Estimated body mass index is 30.94 kg/m as calculated from the following:    Height as of this encounter: 6\' 2"  (1.88 m).   Weight as of this encounter: 241 lb (109.3 kg). BP 118/80   Pulse 80   Temp 97.9 F (36.6 C)   Ht 6\' 2"  (1.88 m)   Wt 241 lb (109.3 kg)   SpO2 99%   BMI 30.94 kg/m  General Appearance: Well nourished, in no apparent distress.  Eyes: PERRLA, EOMs, conjunctiva no swelling or erythema Sinuses: No Frontal/maxillary tenderness  ENT/Mouth: Ext aud canals clear, normal light reflex with TMs without erythema, bulging. Good dentition. No erythema, swelling, or exudate on post pharynx. Tonsils not swollen or erythematous. Hearing normal.  Neck: Supple, thyroid normal. No bruits  Respiratory: Respiratory effort normal, BS equal bilaterally without rales, rhonchi, wheezing or stridor.  Cardio: RRR without murmurs, rubs or gallops. Brisk peripheral pulses without edema.  Chest: symmetric, with normal excursions and percussion.  Abdomen: Soft, obese abdomen, nontender, no guarding, rebound, masses, or organomegaly. He has easily reduced ventral hernia.  Lymphatics: Non tender without lymphadenopathy.  Genitourinary: defer to urology Musculoskeletal: Full ROM all peripheral extremities,5/5 strength, and normal gait.  Skin: Warm, dry without rashes, ecchymosis. He has dark brown, raised 4 mm x 12 mm lesion to R mid back with crusting, irregular borders and coloring to R mid back (has seen derm and cleared). Scabbed lesion to bridge of nose.  Neuro: Cranial nerves intact, reflexes equal bilaterally. Normal muscle tone, no cerebellar symptoms. Sensation intact.  Psych: Awake and oriented X 3, normal affect, Insight and Judgment appropriate.   Clint Strupp 3:48 PM [facility]

## 2023-02-04 DIAGNOSIS — G4733 Obstructive sleep apnea (adult) (pediatric): Secondary | ICD-10-CM | POA: Diagnosis not present

## 2023-02-05 DIAGNOSIS — R972 Elevated prostate specific antigen [PSA]: Secondary | ICD-10-CM | POA: Diagnosis not present

## 2023-02-13 DIAGNOSIS — R3912 Poor urinary stream: Secondary | ICD-10-CM | POA: Diagnosis not present

## 2023-02-13 DIAGNOSIS — R351 Nocturia: Secondary | ICD-10-CM | POA: Diagnosis not present

## 2023-02-13 DIAGNOSIS — N401 Enlarged prostate with lower urinary tract symptoms: Secondary | ICD-10-CM | POA: Diagnosis not present

## 2023-03-02 DIAGNOSIS — D225 Melanocytic nevi of trunk: Secondary | ICD-10-CM | POA: Diagnosis not present

## 2023-03-02 DIAGNOSIS — L814 Other melanin hyperpigmentation: Secondary | ICD-10-CM | POA: Diagnosis not present

## 2023-03-02 DIAGNOSIS — Z08 Encounter for follow-up examination after completed treatment for malignant neoplasm: Secondary | ICD-10-CM | POA: Diagnosis not present

## 2023-03-02 DIAGNOSIS — L821 Other seborrheic keratosis: Secondary | ICD-10-CM | POA: Diagnosis not present

## 2023-03-06 ENCOUNTER — Encounter: Payer: Self-pay | Admitting: Internal Medicine

## 2023-03-06 ENCOUNTER — Ambulatory Visit: Payer: BC Managed Care – PPO | Admitting: Internal Medicine

## 2023-03-06 VITALS — BP 124/70 | HR 71 | Ht 74.0 in | Wt 249.0 lb

## 2023-03-06 DIAGNOSIS — E1069 Type 1 diabetes mellitus with other specified complication: Secondary | ICD-10-CM

## 2023-03-06 DIAGNOSIS — E139 Other specified diabetes mellitus without complications: Secondary | ICD-10-CM

## 2023-03-06 DIAGNOSIS — E785 Hyperlipidemia, unspecified: Secondary | ICD-10-CM

## 2023-03-06 DIAGNOSIS — E66811 Obesity, class 1: Secondary | ICD-10-CM | POA: Diagnosis not present

## 2023-03-06 DIAGNOSIS — E1169 Type 2 diabetes mellitus with other specified complication: Secondary | ICD-10-CM

## 2023-03-06 LAB — POCT GLYCOSYLATED HEMOGLOBIN (HGB A1C): Hemoglobin A1C: 6.9 % — AB (ref 4.0–5.6)

## 2023-03-06 MED ORDER — TIRZEPATIDE 10 MG/0.5ML ~~LOC~~ SOAJ: 10.0000 mg | SUBCUTANEOUS | 3 refills | Status: DC

## 2023-03-06 MED ORDER — HUMALOG 100 UNIT/ML IJ SOLN: INTRAMUSCULAR | 3 refills | Status: DC

## 2023-03-06 NOTE — Patient Instructions (Addendum)
 Please continue: - Metformin  ER 500 mg in am and 500 mg at night - Toujeo  44 units daily  Increase: -  Mounjaro  10 mg weekly.  Change Lispro to: -  Humalog  - 15 min before meals:  5 units in am 12-20 units before lunch 12-20 units before dinner Sliding scale of  Humalog  - 141-160: + 2 units  - 161-180: + 3 units  - 181-200: + 4 units  - 201-240: + 5 units  - 241-280: + 6 units  - 281-320: + 7 units  - 321-360: + 8 units  - >361: + 10 units  Please schedule an appt with Rock Dasen for diabetes education - to see the available pumps.  Please return in 4 months.

## 2023-03-06 NOTE — Progress Notes (Signed)
 Patient ID: ESA RADEN, male   DOB: [birth year], 68 y.o.   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 Last visit 4 months ago. 10/01/2021: changed Insurance from [employer] to [employer].  Interim history: No increased urination but does have nocturia, blurry vision, nausea, chest pain.   Reviewed HbA1c levels: Lab Results  Component Value Date   HGBA1C 6.9 11/04/2022   HGBA1C 6.9 (H) 06/16/2022   HGBA1C 6.0 (A) 02/21/2022   He is on: - Metformin  ER 1000 >> 500 mg in am and 500 mg at night -decreased 2/2 diarrhea -now tolerated well - Mounjaro  5 >> 7.5 mg daily >> Ozempic  1 mg weekly (due to lack of availability of Mounjaro ) >> Mounjaro  7.5 mg  weekly now - Toujeo  42-44 >> 44 units at bedtime - Lispro >> Novolog  - 15 min before meals: in the Cequr pump - he likes it  5 units in am 12-15 >> 12-20 units before lunch 12-18 >> 12-20 units before dinner Correction at bedtime:4-5 units Sliding scale of Humalog  >> Lispro >> Novolog : - 141-160: + 2 units  - 161-180: + 3 units  - 181-200: + 4 units  - 201-240: + 5 units  - 241-280: + 6 units  - 281-320: + 7 units  - 321-360: + 8 units  - >361: + 10 units He had burning at the inj site with Lyumjev . He was previously on Trulicity . He was previously on Lantus  and Tresiba .  Also, Humalog  brand-name.  He checks his sugars more than 4 times a day with his CGM:  Previously:  Previously:   Lowest sugar was 30s ... >> 52 >> 50 >> 60s >> 50; he has hypoglycemia awareness in the 60s. Highest sugar was in the HI >> ... 200s >> 400s >> 375.  Glucometer: one touch ultra 2  Pt's meals are: - Breakfast: Glucerna, banana >> oatmeal >> English muffin or boiled egg or oatmeal - Lunch: eats out - salad with chicken; chicken burrito; sandwich; chick-fil-a >> sandwich with  either tomatoes or peanut butter and jelly + hold milk - Dinner: meat + veggie + starch - Snacks: potato chips, fruit, icecream No sodas.  -+ Mild CKD, last BUN/creatinine:  Lab Results  Component Value Date   BUN 18 12/29/2022   BUN 13 10/27/2022   CREATININE 1.25 12/29/2022   CREATININE 1.05 10/27/2022   Lab Results  Component Value Date   MICRALBCREAT 2 10/27/2022   MICRALBCREAT 2 10/05/2020   MICRALBCREAT 2 09/29/2019   MICRALBCREAT 2 09/21/2018   MICRALBCREAT 7 08/04/2017   MICRALBCREAT 3 06/02/2016   MICRALBCREAT 3 04/18/2015   MICRALBCREAT 2.7 04/06/2014   MICRALBCREAT 3.3 04/06/2013  On losartan .  -+ HL; last set of lipids: Lab Results  Component Value Date   CHOL 144 10/27/2022   HDL 53 10/27/2022   LDLCALC 70 10/27/2022   TRIG 133 10/27/2022   CHOLHDL 2.7 10/27/2022  On Lipitor 40.  - last eye exam was 03/18/2022: No DR  -No numbness and tingling in his feet.  Foot exam 11/04/2022.  He also has a history of HTN, GERD, OSA-on CPAP.  His wife passed away with cancer in 04/24/18.  ROS: + See HPI  I reviewed pt's medications, allergies, PMH, social hx, family hx, and changes were documented in the history of present  illness. Otherwise, unchanged from my initial visit note.  Past Medical History:  Diagnosis Date   Diabetes mellitus without complication (HCC) 1987   insulin  requiring   Elevated PSA 09/22/2018   [facility] is following    GERD (gastroesophageal reflux disease)    Hyperlipidemia    Hypertension    Hypokalemia    Hypomagnesemia    OSA on CPAP    10-12 years ago   Vitamin D  deficiency    Past Surgical History:  Procedure Laterality Date   ANAL FISSURE REPAIR  2007   TONSILLECTOMY Bilateral    As a child   VENTRAL HERNIA REPAIR  04/13/12   Dr. Sheldon, ventral hernia repair w/ mesh   Social History   Social History   Marital status: Married    Spouse name: N/A   Number of children: 2   Occupational History   estimator    Social History Main Topics   Smoking status: Never Smoker   Smokeless tobacco: Not on file   Alcohol use 0.0 oz/week    2 - 5 Cans of beer per week   Current Outpatient Medications on File Prior to Visit  Medication Sig Dispense Refill   ALPRAZolam  (XANAX ) 1 MG tablet Take     1/2 to 1 tablet        2 to 3 x /day          as needed for Anxiety attack 30 tablet 0   aspirin 81 MG tablet Take 81 mg by mouth daily.     atorvastatin  (LIPITOR) 40 MG tablet Take 1 tablet (40 mg total) by mouth daily. for cholesterol 90 tablet 3   BD INSULIN  SYRINGE U/F 31G X 5/16 0.5 ML MISC INJECT INSULIN  TWICE A DAY AS DIRECTED 200 each 3   Cholecalciferol (VITAMIN D ) 125 MCG (5000 UT) CAPS Take 10,000 Units by mouth daily. 60 capsule 0   Continuous Glucose Sensor (DEXCOM G7 SENSOR) MISC Use 1 sensor every 10 days 9 each 3   Cyanocobalamin  (B-12) 2500 MCG SUBL Place under the tongue daily.     cyclobenzaprine  (FLEXERIL ) 10 MG tablet Take 1/2 to 1 tablet 3 x /day as needed for Muscle Spasm 30 tablet 0   finasteride (PROSCAR) 5 MG tablet Take 5 mg by mouth daily.     glucose 4 GM chewable tablet Chew 1 tablet (4 g total) by mouth as needed for low blood sugar. 30 tablet 12   glucose blood (FREESTYLE TEST STRIPS) test strip Use 1- 2 times a day with the freestyle libre 14.  CGM glucometer 150 each 3   injection device for insulin  (CEQUR SIMPLICITY 2U) DEVI 1 each by Other route every 3 (three) days. 30 each 1   Injection Device for Insulin  (CEQUR SIMPLICITY INSERTER) MISC Use as advised 1 each 0   insulin  aspart (NOVOLOG ) 100 UNIT/ML injection Inject 50 units under the skin three times daily before meals. 40 mL 11   insulin  glargine, 2 Unit Dial, (TOUJEO  MAX SOLOSTAR) 300 UNIT/ML Solostar Pen Inject 44 Units into the skin daily. 18 mL 3   Magnesium 500 MG TABS Take 1 tablet by mouth daily.     metFORMIN  (GLUCOPHAGE -XR) 500 MG 24 hr tablet TAKE 1 TABLET TWICE A DAY WITH MEALS FOR DIABETES 180 tablet 3    olmesartan -hydrochlorothiazide  (BENICAR  HCT) 40-12.5 MG tablet Take 0.5 tablets by mouth 2 (two) times daily. 90 tablet 3   omeprazole  (PRILOSEC) 20 MG capsule TAKE 1 CAPSULE DAILY FOR INDIGESTION  AND HEARTBURN 90 capsule 3   potassium chloride  SA (KLOR-CON  M) 20 MEQ tablet TAKE 1 TABLET BY MOUTH TWICE  DAILY FOR POTASSIUM 180 tablet 3   sildenafil (VIAGRA) 25 MG tablet Take 25 mg by mouth daily as needed for erectile dysfunction.     tamsulosin  (FLOMAX ) 0.4 MG CAPS capsule Take 0.4 mg by mouth daily.     tirzepatide  (MOUNJARO ) 7.5 MG/0.5ML Pen Inject 7.5 mg into the skin once a week. 6 mL 3   Current Facility-Administered Medications on File Prior to Visit  Medication Dose Route Frequency Provider Last Rate Last Admin   ipratropium-albuterol  (DUONEB) 0.5-2.5 (3) MG/3ML nebulizer solution 3 mL  3 mL Nebulization Once Cranford, Tonya, NP       Allergies  Allergen Reactions   Crestor [Rosuvastatin] Cough   Family History  Problem Relation Age of Onset   Atrial fibrillation Mother    Kidney cancer Father 4       kidney   Stroke Maternal Grandmother    Dementia Paternal Grandmother        Early 34s   Cancer Paternal Apolinar Jarvis of age or type   Cancer Paternal Uncle 14   PE: BP 124/70   Pulse 71   Ht 6' 2 (1.88 m)   Wt 249 lb (112.9 kg)   SpO2 94%   BMI 31.97 kg/m   Wt Readings from Last 10 Encounters:  03/06/23 249 lb (112.9 kg)  02/03/23 241 lb (109.3 kg)  01/08/23 238 lb (108 kg)  12/29/22 235 lb (106.6 kg)  12/16/22 239 lb 9.6 oz (108.7 kg)  11/04/22 244 lb 3.2 oz (110.8 kg)  10/27/22 239 lb 12.8 oz (108.8 kg)  06/26/22 243 lb 3.2 oz (110.3 kg)  06/16/22 243 lb 3.2 oz (110.3 kg)  04/28/22 243 lb (110.2 kg)   Constitutional: overweight, in NAD Eyes: no exophthalmos ENT: no masses palpated in neck, no cervical lymphadenopathy Cardiovascular: RRR, No MRG Respiratory: CTA B Musculoskeletal: no deformities Skin: no rashes Neurological: no tremor with  outstretched hands  ASSESSMENT: 1. LADA, insulin -dependent, uncontrolled, with complications - CKD stage 2 - ED  - he has an elliptical machine at home  Component     Latest Ref Rng & Units 07/11/2016  Hemoglobin A1C      6.5  Glutamic Acid Decarb Ab     <5 IU/mL 47 (H)  Pancreatic Islet Cell Antibody     <5 JDF Units <5  C-Peptide     0.80 - 3.85 ng/mL 0.37 (L)  Glucose, Fasting     65 - 99 mg/dL 895 (H)   Elevated GAD antibodies and decreased insulin  production. 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 HL  3.  Obesity class I  PLAN:  1. Patient with longstanding, uncontrolled, insulin -dependent diabetes (LADA) on metformin , basal-bolus insulin  regimen and weekly GLP-1/GIP receptor agonist.  He is on the CeQur simplicity insulin  pump.  At last visit, sugars are fluctuating in the upper half of the target range throughout the day but they were increasing after approximately 2 PM and peaking at around 9 PM.  Upon questioning, he was not able to obtain the higher dose of Mounjaro  so he was on Ozempic  1 mg weekly.  He changed his pharmacy so I sent a prescription of Mounjaro  to the [facility] pharmacy but discussed that if he was not  able to obtain it, to increase the dose of Ozempic .  We continued the rest of the regimen.  HbA1c at last visit was 6.9%, stable. CGM interpretation: -At today's visit, we reviewed his CGM downloads: It appears that 57% of values are in target range (goal >70%), while 42% are higher than 180 (goal <25%), and 0% are lower than 70 (goal <4%).  The calculated average blood sugar is 176.  The projected HbA1c for the next 3 months (GMI) is 7.5%. -Reviewing the CGM trends, sugars appear to be improving overnight, per his report, occasionally dropping too low, without a clear reason and increasing in a stepwise fashion throughout the day, with the highest blood sugars after dinner.   He feels that his NovoLog  is not working as fast as Humalog .  He also has been on lispro in the past that he did not feel was working as well as Humalog .  At today's visit, he mentions that he changed his insurance and would like to try Humalog  again.  I called this into his pharmacy.  I advised him to inject this earlier before meals.  He is currently on Mounjaro , but I advised him to increase the dose at today's visit.  -At today's visit we also discussed about regular insulin  pumps and I suggested to see the diabetes educator to be shown different times as I feel that a closed-loop system would work better for him.  He will research this and schedule an appointment with the - I advised him to:  Patient Instructions  Please continue: - Metformin  ER 500 mg in am and 500 mg at night - Toujeo  44 units daily  Increase: -  Mounjaro  10 mg weekly.  Change Lispro to: -  Humalog  - 15 min before meals:  5 units in am 12-20 units before lunch 12-20 units before dinner Sliding scale of  Humalog  - 141-160: + 2 units  - 161-180: + 3 units  - 181-200: + 4 units  - 201-240: + 5 units  - 241-280: + 6 units  - 281-320: + 7 units  - 321-360: + 8 units  - >361: + 10 units  Please schedule an appt with Rock Dasen for diabetes education - to see the available pumps.  Please return in 4 months.    - we checked his HbA1c: 6.9% (stable) - advised to check sugars at different times of the day - 4x a day, rotating check times - advised for yearly eye exams >> he is UTD - return to clinic in 4 months   2. HL -Latest lipid panel was at goal in 10/2022: Lab Results  Component Value Date   CHOL 144 10/27/2022   HDL 53 10/27/2022   LDLCALC 70 10/27/2022   TRIG 133 10/27/2022   CHOLHDL 2.7 10/27/2022  -He continues on Lipitor 40 mg daily without side effects  3.  Obesity class I -We were able to switch from Trulicity  to Mounjaro  for stronger effect on weight and sugars.  However, he was not able  to fill Mounjaro  before last visit and he was still on Ozempic  1 mg weekly.  At that time, I advised him to try again to find Mounjaro , but if not able, to increase the dose of Ozempic  to 2 mg weekly.  He is currently on Mounjaro  and will increase the dose at today's visit. -He gained 1 pound before last visit and gained 5 pounds since then   Lela Fendt, MD PhD [facility] Endocrinology

## 2023-03-11 ENCOUNTER — Encounter: Payer: Self-pay | Admitting: Nurse Practitioner

## 2023-03-16 ENCOUNTER — Ambulatory Visit: Payer: BC Managed Care – PPO | Admitting: Nutrition

## 2023-03-16 DIAGNOSIS — G4733 Obstructive sleep apnea (adult) (pediatric): Secondary | ICD-10-CM | POA: Diagnosis not present

## 2023-03-16 DIAGNOSIS — I10 Essential (primary) hypertension: Secondary | ICD-10-CM | POA: Diagnosis not present

## 2023-04-06 ENCOUNTER — Other Ambulatory Visit: Payer: Self-pay | Admitting: Urology

## 2023-04-08 ENCOUNTER — Encounter (HOSPITAL_COMMUNITY): Payer: Self-pay

## 2023-04-08 NOTE — Progress Notes (Addendum)
 PCP - Lela Fendt, MD LOV 03-06-23 epic Cardiologist -   PPM/ICD -  Device Orders -  Rep Notified -   Chest x-ray - 04-28-22 epic EKG - 10-27-22 epic Stress Test - 2019 ECHO - 09-24-22 epic Cardiac Cath -  Long term monitor-07-20-12 epic HgbAic 03-06-23 6.9 epic  Sleep Study -  CPAP -   Fasting Blood Sugar -  Checks Blood Sugar _____ times a day  Blood Thinner Instructions: Aspirin Instructions:  ERAS Protcol - PRE-SURGERY     COVID vaccine -  MOUNJARO  NONE AFTER 04-15-22 LAST DOSE-  Activity-- Anesthesia review: OSA,HTM,DM 2, mild ascending aortic aneurysm 42mm per echo 09-24-22   Patient denies shortness of breath, fever, cough and chest pain at PAT appointment   All instructions explained to the patient, with a verbal understanding of the material. Patient agrees to go over the instructions while at home for a better understanding. Patient also instructed to self quarantine after being tested for COVID-19. The opportunity to ask questions was provided.

## 2023-04-08 NOTE — Patient Instructions (Signed)
 SURGICAL WAITING ROOM VISITATION  Patients having surgery or a procedure may have no more than 2 support people in the waiting area - these visitors may rotate.    Children under the age of 11 must have an adult with them who is not the patient.  Due to an increase in RSV and influenza rates and associated hospitalizations, children ages 29 and under may not visit patients in [facility] hospitals.  Visitors with respiratory illnesses are discouraged from visiting and should remain at home.  If the patient needs to stay at the hospital during part of their recovery, the visitor guidelines for inpatient rooms apply. Pre-op nurse will coordinate an appropriate time for 1 support person to accompany patient in pre-op.  This support person may not rotate.    Please refer to the [facility] website for the visitor guidelines for Inpatients (after your surgery is over and you are in a regular room).       Your procedure is scheduled on:04-23-23    Report to [facility] Main Entrance    Report to admitting at       0715  AM   Call this number if you have problems the morning of surgery [phone]   Do not eat food OR DINK LIQUIDS :After Midnight. EXCEPT SIPS WITH MEDS                             If you have questions, please contact your surgeon's office.   FOLLOW ANY ADDITIONAL PRE OP INSTRUCTIONS YOU RECEIVED FROM YOUR SURGEON'S OFFICE!!!     Oral Hygiene is also important to reduce your risk of infection.                                    Remember - BRUSH YOUR TEETH THE MORNING OF SURGERY WITH YOUR REGULAR TOOTHPASTE  DENTURES WILL BE REMOVED PRIOR TO SURGERY PLEASE DO NOT APPLY Poly grip OR ADHESIVES!!!   Do NOT smoke after Midnight   Stop all vitamins and herbal supplements 7 days before surgery.   Take these medicines the morning of surgery with A SIP OF WATER: Tamsulosin , omeprazole , finasteride, atorvastatin , xanax  if needed How to Manage Your  Diabetes Before and After Surgery  Why is it important to control my blood sugar before and after surgery? Improving blood sugar levels before and after surgery helps healing and can limit problems. A way of improving blood sugar control is eating a healthy diet by:  Eating less sugar and carbohydrates  Increasing activity/exercise  Talking with your doctor about reaching your blood sugar goals High blood sugars (greater than 180 mg/dL) can raise your risk of infections and slow your recovery, so you will need to focus on controlling your diabetes during the weeks before surgery. Make sure that the doctor who takes care of your diabetes knows about your planned surgery including the date and location.  How do I manage my blood sugar before surgery? Check your blood sugar at least 4 times a day, starting 2 days before surgery, to make sure that the level is not too high or low. Check your blood sugar the morning of your surgery when you wake up and every 2 hours until you get to the Short Stay unit. If your blood sugar is less than 70 mg/dL, you will need to treat for low blood sugar:  Do not take insulin . Treat a low blood sugar (less than 70 mg/dL) with  cup of clear juice (cranberry or apple), 4 glucose tablets, OR glucose gel. Recheck blood sugar in 15 minutes after treatment (to make sure it is greater than 70 mg/dL). If your blood sugar is not greater than 70 mg/dL on recheck, call [phone] for further instructions. Report your blood sugar to the short stay nurse when you get to Short Stay.  If you are admitted to the hospital after surgery: Your blood sugar will be checked by the staff and you will probably be given insulin  after surgery (instead of oral diabetes medicines) to make sure you have good blood sugar levels. The goal for blood sugar control after surgery is 80-180 mg/dL.   WHAT DO I DO ABOUT MY DIABETES MEDICATION?  Do not take oral diabetes medicines (pills) the  morning of surgery.  TOUJEO  TAKE AS USUAL MORNING BEFORE SURGERY NO EVENING DOSE DAY BEFORE SURGERY  DAY OF SURGERY IF YOU TAKE IN THE AM YOU WILL TAKE 50% OF YOUR NORMAL DOSE        DO NOT TAKE THE FOLLOWING 7 DAYS PRIOR TO SURGERY: Ozempic , Wegovy , Rybelsus  (Semaglutide ), Byetta (exenatide), Bydureon (exenatide ER), Victoza, Saxenda (liraglutide), or Trulicity  (dulaglutide ) Mounjaro  (Tirzepatide )      NONE AFTER 04-16-23   Adlyxin (Lixisenatide), Polyethylene Glycol Loxenatide.  If your CBG is greater than 220 mg/dL, you may take  of your sliding scale  (correction) dose of insulin .    For patients with insulin  pumps: Contact your diabetes doctor for specific instructions before surgery. Decrease basal rates by 20% at midnight the night before your surgery. Note that if your surgery is planned to be longer than 2 hours, your insulin  pump will be removed and intravenous (IV) insulin  will be started and managed by the nurses and the anesthesiologist. You will be able to restart your insulin  pump once you are awake and able to manage it.  Make sure to bring insulin  pump supplies to the hospital with you in case the  site needs to be changed.  Patient Signature:  Date:   Nurse Signature:  Date:   Reviewed and Endorsed by [facility] Patient Education Committee, August 2015  DO NOT TAKE ANY ORAL DIABETIC MEDICATIONS DAY OF YOUR SURGERY  Bring CPAP mask and tubing day of surgery.                              You may not have any metal on your body including hair pins, jewelry, and body piercing             Do not wear lotions, powders, /cologne, or deodorant                Men may shave face and neck.   Do not bring valuables to the hospital. [facility] IS NOT             RESPONSIBLE   FOR VALUABLES.   Contacts, glasses, dentures or bridgework may not be worn into surgery.   Bring small overnight bag day of surgery.   DO NOT BRING YOUR HOME MEDICATIONS TO THE  HOSPITAL. PHARMACY WILL DISPENSE MEDICATIONS LISTED ON YOUR MEDICATION LIST TO YOU DURING YOUR ADMISSION IN THE HOSPITAL!    Patients discharged on the day of surgery will not be allowed to drive home.  Someone NEEDS to stay with you for the first 24 hours  after anesthesia.   Special Instructions: Bring a copy of your healthcare power of attorney and living will documents the day of surgery if you haven't scanned them before.              Please read over the following fact sheets you were given: IF YOU HAVE QUESTIONS ABOUT YOUR PRE-OP INSTRUCTIONS PLEASE CALL [phone]    If you test positive for Covid or have been in contact with anyone that has tested positive in the last 10 days please notify you surgeon.    [facility] - Preparing for Surgery Before surgery, you can play an important role.  Because skin is not sterile, your skin needs to be as free of germs as possible.  You can reduce the number of germs on your skin by washing with CHG (chlorahexidine gluconate) soap before surgery.  CHG is an antiseptic cleaner which kills germs and bonds with the skin to continue killing germs even after washing. Please DO NOT use if you have an allergy to CHG or antibacterial soaps.  If your skin becomes reddened/irritated stop using the CHG and inform your nurse when you arrive at Short Stay. Do not shave (including legs and underarms) for at least 48 hours prior to the first CHG shower.  You may shave your face/neck. Please follow these instructions carefully:  1.  Shower with CHG Soap the night before surgery and the  morning of Surgery.  2.  If you choose to wash your hair, wash your hair first as usual with your  normal  shampoo.  3.  After you shampoo, rinse your hair and body thoroughly to remove the  shampoo.                           4.  Use CHG as you would any other liquid soap.  You can apply chg directly  to the skin and wash                       Gently with a scrungie or clean  washcloth.  5.  Apply the CHG Soap to your body ONLY FROM THE NECK DOWN.   Do not use on face/ open                           Wound or open sores. Avoid contact with eyes, ears mouth and genitals (private parts).                       Wash face,  Genitals (private parts) with your normal soap.             6.  Wash thoroughly, paying special attention to the area where your surgery  will be performed.  7.  Thoroughly rinse your body with warm water from the neck down.  8.  DO NOT shower/wash with your normal soap after using and rinsing off  the CHG Soap.                9.  Pat yourself dry with a clean towel.            10.  Wear clean pajamas.            11.  Place clean sheets on your bed the night of your first shower and do not  sleep with pets. Day of Surgery : Do not  apply any lotions/deodorants the morning of surgery.  Please wear clean clothes to the hospital/surgery center.  FAILURE TO FOLLOW THESE INSTRUCTIONS MAY RESULT IN THE CANCELLATION OF YOUR SURGERY PATIENT SIGNATURE_________________________________  NURSE SIGNATURE__________________________________  ________________________________________________________________________

## 2023-04-10 ENCOUNTER — Encounter (HOSPITAL_COMMUNITY)
Admission: RE | Admit: 2023-04-10 | Discharge: 2023-04-10 | Disposition: A | Discharge status: Home / Self Care | Payer: BC Managed Care – PPO | Source: Ambulatory Visit | Attending: Anesthesiology | Admitting: Anesthesiology

## 2023-04-10 DIAGNOSIS — E119 Type 2 diabetes mellitus without complications: Secondary | ICD-10-CM

## 2023-04-10 DIAGNOSIS — Z794 Long term (current) use of insulin: Secondary | ICD-10-CM

## 2023-04-15 ENCOUNTER — Other Ambulatory Visit: Payer: Self-pay | Admitting: Urology

## 2023-04-15 DIAGNOSIS — N401 Enlarged prostate with lower urinary tract symptoms: Secondary | ICD-10-CM

## 2023-04-23 ENCOUNTER — Ambulatory Visit (HOSPITAL_COMMUNITY): Admit: 2023-04-23 | Payer: BC Managed Care – PPO | Admitting: Urology

## 2023-04-23 SURGERY — TRANSURETHRAL RESECTION OF THE PROSTATE (TURP)
Anesthesia: General

## 2023-05-06 ENCOUNTER — Ambulatory Visit
Admission: RE | Admit: 2023-05-06 | Discharge: 2023-05-06 | Disposition: A | Discharge status: Home / Self Care | Payer: BC Managed Care – PPO | Source: Ambulatory Visit | Attending: Urology | Admitting: Urology

## 2023-05-06 DIAGNOSIS — N401 Enlarged prostate with lower urinary tract symptoms: Secondary | ICD-10-CM | POA: Diagnosis not present

## 2023-05-06 DIAGNOSIS — R339 Retention of urine, unspecified: Secondary | ICD-10-CM | POA: Diagnosis not present

## 2023-05-06 HISTORY — PX: IR RADIOLOGIST EVAL & MGMT: IMG5224

## 2023-05-06 NOTE — Progress Notes (Signed)
 Reason for visit: BPH with LUTS. Prostate embolization.   Care Team(s) Primary Care: Salvatore Decent, FNP Endocrinology: Carlus Pavlov, MD  Urology:  Noel Christmas MD  History of Present Illness:  [name] is a 68 y.o. male comorbid w PMHx significant for HTN, T2DM on metformin, elevated PSA w negative prostate Bx (01/31/22). Pt is closely followed by Urology and was planned for operative intervention with potential aqua-ablation on 04/23/23, however this was cancelled. Pt was then referred to discuss minimally invasive options for prostate debulking with prostate artery embolization.   He reports experiencing urinary retention for years, managed on alpha blockade with acute episode a few months prior when his medications ran out. He denies prior catheterization. He works as a Oncologist. ROS was positive for prior hematuria with a negative CT urogram (10/18/2018), ED on sildenafil and retrograde ejaculation.  I used a International Prostatism Symptom Score* (IPSS) Score to quantify his symptoms : 25 points (Severe symptoms)   *Gery Pray MJ, Melene Muller MP, et al. The American Urological Association symptom index for benign prostatic hyperplasia. The Measurement Committee of the American Urological Association. J Urol A5431891; 191:4782.   Review of Systems: A 12 point ROS discussed and pertinent positives are indicated in the HPI above.  All other systems are negative.   Past Medical History:  Diagnosis Date   Diabetes mellitus without complication (HCC) 1987   insulin requiring   Elevated PSA 09/22/2018   [facility] is following    GERD (gastroesophageal reflux disease)    Hyperlipidemia    Hypertension    Hypokalemia    Hypomagnesemia    OSA on CPAP    10-12 years ago   Vitamin D deficiency     Past Surgical History:  Procedure Laterality Date   ANAL FISSURE REPAIR  2007   IR RADIOLOGIST EVAL & MGMT  05/06/2023   TONSILLECTOMY Bilateral     As a child   VENTRAL HERNIA REPAIR  04/13/12   Dr. Michaell Cowing, ventral hernia repair w/ mesh    Allergies: Crestor [rosuvastatin]  Medications: Prior to Admission medications   Medication Sig Start Date End Date Taking? Authorizing Provider  ALPRAZolam Prudy Feeler) 1 MG tablet Take     1/2 to 1 tablet        2 to 3 x /day          as needed for Anxiety attack Patient taking differently: Take 1 mg by mouth every morning. 01/02/20   Lucky Cowboy, MD  aspirin 81 MG tablet Take 81 mg by mouth daily.    [provider]  atorvastatin (LIPITOR) 40 MG tablet Take 1 tablet (40 mg total) by mouth daily. for cholesterol 12/08/22   Adela Glimpse, NP  BD INSULIN SYRINGE U/F 31G X 5/16" 0.5 ML MISC INJECT INSULIN TWICE A DAY AS DIRECTED 10/11/19   Elder Negus, NP  Cholecalciferol (VITAMIN D) 125 MCG (5000 UT) CAPS Take 10,000 Units by mouth daily. Patient taking differently: Take 5,000 Units by mouth daily. 09/22/18   Judd Gaudier, NP  Continuous Glucose Sensor (DEXCOM G7 SENSOR) MISC Use 1 sensor every 10 days 11/04/22   Carlus Pavlov, MD  Cyanocobalamin (B-12) 2500 MCG SUBL Place 2,500 mcg under the tongue daily.    [provider]  cyclobenzaprine (FLEXERIL) 10 MG tablet Take 1/2 to 1 tablet 3 x /day as needed for Muscle Spasm Patient not taking: Reported on 04/08/2023 12/29/22   Adela Glimpse, NP  finasteride (PROSCAR) 5 MG  tablet Take 5 mg by mouth daily.    [provider]  glucose 4 GM chewable tablet Chew 1 tablet (4 g total) by mouth as needed for low blood sugar. 08/09/15   Quentin Mulling R, PA-C  glucose blood (FREESTYLE TEST STRIPS) test strip Use 1- 2 times a day with the freestyle libre 14.  CGM glucometer 09/23/19   Carlus Pavlov, MD  HUMALOG 100 UNIT/ML injection Use up to 80 units a day in the insulin pump 03/06/23   Carlus Pavlov, MD  injection device for insulin (CEQUR SIMPLICITY 2U) DEVI 1 each by Other route every 3 (three) days. 11/04/22   Carlus Pavlov, MD  Injection Device for Insulin (CEQUR SIMPLICITY INSERTER) MISC Use as advised 11/26/21   Carlus Pavlov, MD  insulin glargine, 2 Unit Dial, (TOUJEO MAX SOLOSTAR) 300 UNIT/ML Solostar Pen Inject 44 Units into the skin daily. 11/04/22   Carlus Pavlov, MD  Magnesium 500 MG TABS Take 500 mg by mouth daily.    [provider]  metFORMIN (GLUCOPHAGE-XR) 500 MG 24 hr tablet TAKE 1 TABLET TWICE A DAY WITH MEALS FOR DIABETES 11/04/22   Carlus Pavlov, MD  olmesartan-hydrochlorothiazide (BENICAR HCT) 40-12.5 MG tablet Take 0.5 tablets by mouth 2 (two) times daily. 12/16/22   Alver Sorrow, NP  omeprazole (PRILOSEC) 20 MG capsule TAKE 1 CAPSULE DAILY FOR INDIGESTION AND HEARTBURN 12/08/22   Cranford, Archie Patten, NP  potassium chloride SA (KLOR-CON M) 20 MEQ tablet TAKE 1 TABLET BY MOUTH TWICE  DAILY FOR POTASSIUM 12/08/22   Adela Glimpse, NP  sildenafil (VIAGRA) 25 MG tablet Take 25 mg by mouth daily as needed for erectile dysfunction.    [provider]  tamsulosin (FLOMAX) 0.4 MG CAPS capsule Take 0.8 mg by mouth daily.    [provider]  tirzepatide Greggory Keen) 10 MG/0.5ML Pen Inject 10 mg into the skin once a week. 03/06/23   Carlus Pavlov, MD     Family History  Problem Relation Age of Onset   Atrial fibrillation Mother    Kidney cancer Father 49       kidney   Stroke Maternal Grandmother    Dementia Paternal Grandmother        Early 7s   Cancer Paternal Grandfather        Unsure of age or type   Cancer Paternal Uncle 87    Social History   Socioeconomic History   Marital status: Widowed    Spouse name: Not on file   Number of children: 2   Years of education: Not on file   Highest education level: Not on file  Occupational History   Not on file  Tobacco Use   Smoking status: Never   Smokeless tobacco: Never  Vaping Use   Vaping status: Never Used  Substance and Sexual Activity   Alcohol use: Yes    Alcohol/week: 3.0 - 5.0 standard  drinks of alcohol    Types: 3 - 5 Standard drinks or equivalent per week    Comment: weekly   Drug use: No   Sexual activity: Yes    Partners: Female  Other Topics Concern   Not on file  Social History Narrative   Not on file   Social Drivers of Health   Financial Resource Strain: Not on file  Food Insecurity: Not on file  Transportation Needs: Not on file  Physical Activity: Insufficiently Active (08/04/2017)   Exercise Vital Sign    Days of Exercise per Week: 1 day  Minutes of Exercise per Session: 50 min  Stress: No Stress Concern Present (09/21/2018)   [employer] of Occupational Health - Occupational Stress Questionnaire    Feeling of Stress : Only a little  Social Connections: Not on file    Review of Systems As above  Vital Signs: BP (!) 148/80   Pulse 77   Temp 97.6 F (36.4 C)   Resp 17   SpO2 96%   Physical Exam  General: WN, NAD  CV: RRR on monitor Pulm: normal work of breathing on RA Abd: S, ND, NT MSK: Grossly normal Psych: Appropriate affect.  Imaging:  CT Urogram, 10/18/2018 IMPRESSION:  Prostate is enlarged and indents the bladder. Associated bladder wall thickening is indicative of a degree of outlet obstruction.  No additional findings to explain the patient's hematuria.  Calculated prostate volume, 6.6 x 5.1 x 5.8 cm = 98 mL    [place] Prostate Bx, 01/31/22 Calculated prostate volume, 99 mL    IR Radiologist Eval & Mgmt Result Date: 05/06/2023 EXAM: NEW PATIENT OFFICE VISIT CHIEF COMPLAINT: See below HISTORY OF PRESENT ILLNESS: See below REVIEW OF SYSTEMS: See below PHYSICAL EXAMINATION: See below ASSESSMENT AND PLAN: Please refer to completed note in the electronic medical record on [facility] Roanna Banning, MD [facility] Radiology Electronically Signed   By: Roanna Banning M.D.   On: 05/06/2023 09:36    Labs:  CBC: Recent Labs    06/16/22 1626 10/27/22 1552 12/29/22 1548   WBC 6.4 6.3 6.9  HGB 14.9 15.0 14.8  HCT 44.6 44.5 44.9  PLT 233 217 221    COAGS: No results for input(s): "INR", "APTT" in the last 8760 hours.  BMP: Recent Labs    06/16/22 1626 10/27/22 1552 12/29/22 1548 05/07/23 1531  NA 141 138 138 139  K 3.4* 3.3* 3.7 3.6  CL 102 101 99 98  CO2 29 28 26 27   GLUCOSE 62* 110* 138* 230*  BUN 17 13 18 14   CALCIUM 9.0 9.3 9.3 9.4  CREATININE 0.94 1.05 1.25 1.04    Assessment and Plan:  68 y/o M comorbid w PMHx significant for HTN, T2DM on metformin, elevated PSA w negative prostate Bx (01/31/22), and long standing BPH w LUTS.  Prostatomegaly (~100 mL) PSA, last 2.5 (02/05/23) IPSS Score Total, 25 points (Severe symptoms) QoL dissatisfied, on tamsulosin 0.4 mg qHS and finasteride 5mg  QD  Given the nature of the disease, I felt that the patient would benefit from Prostate Artery Embolization (PAE).  The procedure has been fully reviewed with the patient/patient's authorized representative. The risks, benefits and alternatives of PAE were fully discussed including technique, potential complications, timeline of improvement, efficacy and durability. The patient/patient's authorized representative has consented to the procedure.   *Will order CTA Pelvis for procedural planning *OK to proceed to schedule PAE on mutual availability.   *Same day procedure, no overnight admission. Procedure to be performed at [facility] *on Metformin. Request 2 day hold prior to procedure and resume 2 days post op, to reduce risk of LA with iodinated contrast  *Pt habitus > 6 ft, will plan for trans-femoral access. *Rocephin for pre op Abx *CBC, BMP, Coags + PSA to be drawn on procedure day  Thank you for this interesting consult.  I greatly enjoyed meeting Harry [name] and look forward to participating in their care.  A copy of this report was sent to the requesting provider on this date.  Electronically Signed:  Cletis Athens  Veria Stradley, MD [facility] Inter[facility]  [facility]iology [facility] Radiology   Pager. [phone] Clinic. [phone]  I spent a total of 60 Minutes in face to face in clinical consultation, greater than 50% of which was counseling/coordinating care for Mr Harry [name] BPH and urinary retention

## 2023-05-07 ENCOUNTER — Encounter: Payer: Self-pay | Admitting: Internal Medicine

## 2023-05-07 ENCOUNTER — Ambulatory Visit: Payer: BC Managed Care – PPO | Admitting: Nurse Practitioner

## 2023-05-07 ENCOUNTER — Other Ambulatory Visit: Payer: Self-pay | Admitting: Interventional Radiology

## 2023-05-07 ENCOUNTER — Ambulatory Visit: Payer: BC Managed Care – PPO | Admitting: Internal Medicine

## 2023-05-07 VITALS — BP 128/78 | HR 74 | Temp 98.1°F | Ht 74.0 in | Wt 240.4 lb

## 2023-05-07 DIAGNOSIS — E1122 Type 2 diabetes mellitus with diabetic chronic kidney disease: Secondary | ICD-10-CM

## 2023-05-07 DIAGNOSIS — N401 Enlarged prostate with lower urinary tract symptoms: Secondary | ICD-10-CM | POA: Diagnosis not present

## 2023-05-07 DIAGNOSIS — R351 Nocturia: Secondary | ICD-10-CM

## 2023-05-07 DIAGNOSIS — Z23 Encounter for immunization: Secondary | ICD-10-CM | POA: Diagnosis not present

## 2023-05-07 DIAGNOSIS — E785 Hyperlipidemia, unspecified: Secondary | ICD-10-CM | POA: Diagnosis not present

## 2023-05-07 DIAGNOSIS — E1169 Type 2 diabetes mellitus with other specified complication: Secondary | ICD-10-CM

## 2023-05-07 DIAGNOSIS — I10 Essential (primary) hypertension: Secondary | ICD-10-CM

## 2023-05-07 DIAGNOSIS — N182 Chronic kidney disease, stage 2 (mild): Secondary | ICD-10-CM

## 2023-05-07 DIAGNOSIS — E139 Other specified diabetes mellitus without complications: Secondary | ICD-10-CM

## 2023-05-07 NOTE — Progress Notes (Signed)
 [facility] PRIMARY CARE [facility] [address] [place] [address] Dept: [phone] Dept Fax: [phone]  New Patient Office Visit  Subjective:   Harry [name] Mar 25, 1955 05/07/2023  Chief Complaint  Patient presents with   Establish Care    HPI: Harry [name] presents today to establish care at [facility]. Introduced to Publishing rights manager role and practice setting.  All questions answered.  Concerns: See below   Discussed the use of AI scribe software for clinical note transcription with the patient, who gave verbal consent to proceed.  History of Present Illness   The patient, with a history of diabetes, benign prostatic hyperplasia (BPH), hypertension, CKD stage 2, and hyperlipidemia, is managed by an endocrinologist and a primary care provider. The patient's diabetes is well-controlled with an A1c of 6.9. The patient splits his blood pressure medication due to lightheadedness. The patient also takes a vitamin D supplement. He is managed by urology for BPH, awaiting surgical intervention.      Lab Results  Component Value Date   HGBA1C 6.9 (A) 03/06/2023     The following portions of the patient's history were reviewed and updated as appropriate: past medical history, past surgical history, family history, social history, allergies, medications, and problem list.   Patient Active Problem List   Diagnosis Date Noted   Severe obstructive sleep apnea-hypopnea syndrome 12/29/2017   Family history of cerebrovascular disease 11/29/2017   LADA (latent autoimmune diabetes in adults), managed as type 2 (HCC) 11/12/2015   PVC's (premature ventricular contractions) 05/22/2014   Aneurysm, ascending aorta (HCC) 05/22/2014   BPH (benign prostatic hyperplasia) 04/06/2014   Erectile dysfunction associated with type 2 diabetes mellitus (HCC) 04/06/2014   CKD stage 2 due to type 2 diabetes mellitus (HCC) 02/17/2014    Obesity hypoventilation syndrome (HCC) 02/17/2014   Hyperlipidemia associated with type 2 diabetes mellitus (HCC)    Essential hypertension    Vitamin D deficiency    GERD (gastroesophageal reflux disease)    Ventral hernia - periumbilical 2-3cm 02/04/2012   Morbid obesity (HCC) - BMI 30+ with OSA 02/04/2012   Past Medical History:  Diagnosis Date   Diabetes mellitus without complication (HCC) 1987   insulin requiring   Elevated PSA 09/22/2018   [facility] is following    GERD (gastroesophageal reflux disease)    Hyperlipidemia    Hypertension    Hypokalemia    Hypomagnesemia    OSA on CPAP    10-12 years ago   Vitamin D deficiency    Past Surgical History:  Procedure Laterality Date   ANAL FISSURE REPAIR  2007   IR RADIOLOGIST EVAL & MGMT  05/06/2023   TONSILLECTOMY Bilateral    As a child   VENTRAL HERNIA REPAIR  04/13/12   Dr. Michaell Cowing, ventral hernia repair w/ mesh   Family History  Problem Relation Age of Onset   Atrial fibrillation Mother    Kidney cancer Father 102       kidney   Stroke Maternal Grandmother    Dementia Paternal Grandmother        Early 83s   Cancer Paternal Grandfather        Unsure of age or type   Cancer Paternal Uncle 44    Current Outpatient Medications:    ALPRAZolam (XANAX) 1 MG tablet, Take     1/2 to 1 tablet        2 to 3 x /day  as needed for Anxiety attack (Patient taking differently: Take 1 mg by mouth every morning.), Disp: 30 tablet, Rfl: 0   aspirin 81 MG tablet, Take 81 mg by mouth daily., Disp: , Rfl:    atorvastatin (LIPITOR) 40 MG tablet, Take 1 tablet (40 mg total) by mouth daily. for cholesterol, Disp: 90 tablet, Rfl: 3   BD INSULIN SYRINGE U/F 31G X 5/16" 0.5 ML MISC, INJECT INSULIN TWICE A DAY AS DIRECTED, Disp: 200 each, Rfl: 3   Cholecalciferol (VITAMIN D) 125 MCG (5000 UT) CAPS, Take 10,000 Units by mouth daily. (Patient taking differently: Take 5,000 Units by mouth daily.), Disp: 60 capsule, Rfl: 0    Continuous Glucose Sensor (DEXCOM G7 SENSOR) MISC, Use 1 sensor every 10 days, Disp: 9 each, Rfl: 3   Cyanocobalamin (B-12) 2500 MCG SUBL, Place 2,500 mcg under the tongue daily., Disp: , Rfl:    finasteride (PROSCAR) 5 MG tablet, Take 5 mg by mouth daily., Disp: , Rfl:    glucose 4 GM chewable tablet, Chew 1 tablet (4 g total) by mouth as needed for low blood sugar., Disp: 30 tablet, Rfl: 12   glucose blood (FREESTYLE TEST STRIPS) test strip, Use 1- 2 times a day with the freestyle libre 14.  CGM glucometer, Disp: 150 each, Rfl: 3   HUMALOG 100 UNIT/ML injection, Use up to 80 units a day in the insulin pump, Disp: 60 mL, Rfl: 3   injection device for insulin (CEQUR SIMPLICITY 2U) DEVI, 1 each by Other route every 3 (three) days., Disp: 30 each, Rfl: 1   Injection Device for Insulin (CEQUR SIMPLICITY INSERTER) MISC, Use as advised, Disp: 1 each, Rfl: 0   insulin glargine, 2 Unit Dial, (TOUJEO MAX SOLOSTAR) 300 UNIT/ML Solostar Pen, Inject 44 Units into the skin daily., Disp: 18 mL, Rfl: 3   Magnesium 500 MG TABS, Take 500 mg by mouth daily., Disp: , Rfl:    metFORMIN (GLUCOPHAGE-XR) 500 MG 24 hr tablet, TAKE 1 TABLET TWICE A DAY WITH MEALS FOR DIABETES, Disp: 180 tablet, Rfl: 3   olmesartan-hydrochlorothiazide (BENICAR HCT) 40-12.5 MG tablet, Take 0.5 tablets by mouth 2 (two) times daily., Disp: 90 tablet, Rfl: 3   omeprazole (PRILOSEC) 20 MG capsule, TAKE 1 CAPSULE DAILY FOR INDIGESTION AND HEARTBURN, Disp: 90 capsule, Rfl: 3   potassium chloride SA (KLOR-CON M) 20 MEQ tablet, TAKE 1 TABLET BY MOUTH TWICE  DAILY FOR POTASSIUM, Disp: 180 tablet, Rfl: 3   sildenafil (VIAGRA) 25 MG tablet, Take 25 mg by mouth daily as needed for erectile dysfunction., Disp: , Rfl:    tamsulosin (FLOMAX) 0.4 MG CAPS capsule, Take 0.8 mg by mouth daily., Disp: , Rfl:    tirzepatide (MOUNJARO) 10 MG/0.5ML Pen, Inject 10 mg into the skin once a week., Disp: 6 mL, Rfl: 3  Current Facility-Administered Medications:     ipratropium-albuterol (DUONEB) 0.5-2.5 (3) MG/3ML nebulizer solution 3 mL, 3 mL, Nebulization, Once, Cranford, Tonya, NP Allergies  Allergen Reactions   Crestor [Rosuvastatin] Cough    ROS: A complete ROS was performed with pertinent positives/negatives noted in the HPI. The remainder of the ROS are negative.   Objective:   Today's Vitals   05/07/23 1452  BP: 128/78  Pulse: 74  Temp: 98.1 F (36.7 C)  TempSrc: Temporal  SpO2: 99%  Weight: 240 lb 6.4 oz (109 kg)  Height: 6\' 2"  (1.88 m)    GENERAL: Well-appearing, in NAD. Well nourished.  SKIN: Pink, warm and dry. No rash, lesion, ulceration, or  ecchymoses.  NECK: Trachea midline. Full ROM w/o pain or tenderness. No lymphadenopathy.  RESPIRATORY: Chest wall symmetrical. Respirations even and non-labored. Breath sounds clear to auscultation bilaterally.  CARDIAC: S1, S2 present, regular rate and rhythm. Peripheral pulses 2+ bilaterally.  EXTREMITIES: Without clubbing, cyanosis, or edema.  NEUROLOGIC: No motor or sensory deficits. Steady, even gait.  PSYCH/MENTAL STATUS: Alert, oriented x 3. Cooperative, appropriate mood and affect.   Health Maintenance Due  Topic Date Due   Zoster Vaccines- Shingrix (1 of 2) 03/20/1973   OPHTHALMOLOGY EXAM  03/19/2023    No results found for any visits on 05/07/23.  Assessment & Plan:  Assessment and Plan    Benign Prostatic Hyperplasia (BPH) On finasteride and tamsulosin. Awaiting insurance approval for surgery.  - Continue finasteride and tamsulosin.   Type 2 Diabetes Mellitus Well-controlled with A1c of 6.9%. Managed by endocrinologist.  - Continue current diabetes management plan. - Meet with dietician for dietary management.  Hypertension On olmesartan/hydrochlorothiazide. Lightheadedness managed by dose adjustment. - Continue olmesartan/hydrochlorothiazide with adjusted dosing schedule.  Hyperlipidemia Effectively managed with atorvastatin 40 mg. - Continue atorvastatin 40  mg daily.  Vitamin D Deficiency Levels within normal range with supplementation. - Continue vitamin D supplementation.  General Health Maintenance Due for shingles vaccine, flu shot, and routine eye exam. - Administer flu shot today. - Provide information on shingles vaccine and recommend scheduling it. - Refer to ophthalmologist for routine eye exam. - Check kidney function, cholesterol, vitamin D, and thyroid levels with blood work.  Follow-up Follow-up in four months unless lab results or other issues arise. - Schedule follow-up appointment in four months. - Contact with lab results via My Chart or phone call. - Advise to contact office if no referral for eye exam is received in two weeks.        Orders Placed This Encounter  Procedures   Flu Vaccine Trivalent High Dose (Fluad)   Comp Met (CMET)   TSH   Lipid Profile   Ambulatory referral to Ophthalmology    Referral Priority:   Routine    Referral Type:   Consultation    Referral Reason:   Specialty Services Required    Requested Specialty:   Ophthalmology    Number of Visits Requested:   1   No orders of the defined types were placed in this encounter.   Return in about 4 months (around 09/06/2023) for Hypertension, Cholesterol, CKD, Vit D.   Salvatore Decent, FNP

## 2023-05-08 LAB — COMPREHENSIVE METABOLIC PANEL
ALT: 16 U/L (ref 0–53)
AST: 15 U/L (ref 0–37)
Albumin: 4.1 g/dL (ref 3.5–5.2)
Alkaline Phosphatase: 76 U/L (ref 39–117)
BUN: 14 mg/dL (ref 6–23)
CO2: 27 meq/L (ref 19–32)
Calcium: 9.4 mg/dL (ref 8.4–10.5)
Chloride: 98 meq/L (ref 96–112)
Creatinine, Ser: 1.04 mg/dL (ref 0.40–1.50)
GFR: 73.46 mL/min (ref >60.00)
Glucose, Bld: 230 mg/dL — ABNORMAL HIGH (ref 70–99)
Potassium: 3.6 meq/L (ref 3.5–5.1)
Sodium: 139 meq/L (ref 135–145)
Total Bilirubin: 1 mg/dL (ref 0.2–1.2)
Total Protein: 6.7 g/dL (ref 6.0–8.3)

## 2023-05-08 LAB — LIPID PANEL
Cholesterol: 149 mg/dL (ref 0–200)
HDL: 62.4 mg/dL (ref >39.00)
LDL Cholesterol: 64 mg/dL (ref 0–99)
NonHDL: 86.82
Total CHOL/HDL Ratio: 2
Triglycerides: 114 mg/dL (ref 0.0–149.0)
VLDL: 22.8 mg/dL (ref 0.0–40.0)

## 2023-05-08 LAB — TSH: TSH: 1.95 u[IU]/mL (ref 0.35–5.50)

## 2023-05-11 ENCOUNTER — Encounter: Payer: Self-pay | Admitting: Internal Medicine

## 2023-05-12 ENCOUNTER — Ambulatory Visit
Admission: RE | Admit: 2023-05-12 | Discharge: 2023-05-12 | Disposition: A | Discharge status: Home / Self Care | Source: Ambulatory Visit | Attending: Interventional Radiology

## 2023-05-12 DIAGNOSIS — E559 Vitamin D deficiency, unspecified: Secondary | ICD-10-CM | POA: Diagnosis not present

## 2023-05-12 DIAGNOSIS — I10 Essential (primary) hypertension: Secondary | ICD-10-CM | POA: Diagnosis not present

## 2023-05-12 DIAGNOSIS — N401 Enlarged prostate with lower urinary tract symptoms: Secondary | ICD-10-CM | POA: Diagnosis not present

## 2023-05-12 MED ORDER — IOPAMIDOL (ISOVUE-370) INJECTION 76%
100.0000 mL | Freq: Once | INTRAVENOUS | Status: AC | PRN
  Administered 2023-05-12: 100 mL via INTRAVENOUS

## 2023-05-18 ENCOUNTER — Other Ambulatory Visit (HOSPITAL_COMMUNITY): Payer: Self-pay | Admitting: Interventional Radiology

## 2023-05-18 DIAGNOSIS — N401 Enlarged prostate with lower urinary tract symptoms: Secondary | ICD-10-CM

## 2023-06-15 DIAGNOSIS — I10 Essential (primary) hypertension: Secondary | ICD-10-CM | POA: Diagnosis not present

## 2023-06-15 DIAGNOSIS — G4733 Obstructive sleep apnea (adult) (pediatric): Secondary | ICD-10-CM | POA: Diagnosis not present

## 2023-06-16 ENCOUNTER — Other Ambulatory Visit: Payer: Self-pay | Admitting: Radiology

## 2023-06-16 DIAGNOSIS — N401 Enlarged prostate with lower urinary tract symptoms: Secondary | ICD-10-CM

## 2023-06-16 NOTE — H&P (Signed)
 Chief Complaint: Benign prostatic hypertrophy with lower urinary tract symptoms; referred  for prostate artery embolization  Referring Provider(s): Pace,M  Supervising Physician: Roanna Banning  Patient Status: [facility] - Out-pt  History of Present Illness: Harry [name] is a 68 y.o. male with PMH sig for DM, GERD, HLD, HTN, OSA, and BPH with LUTS/elevated PSA with neg bx 01/31/22. He underwent consultation with Dr. Milford Cage on 05/06/23 to discuss treatment options for his longstanding BPH and was deemed an appropriate candidate for bilateral prostate artery embolization. He presents today for the procedure.  *** Patient is Full Code  Past Medical History:  Diagnosis Date   Diabetes mellitus without complication (HCC) 1987   insulin requiring   Elevated PSA 09/22/2018   [facility] is following    GERD (gastroesophageal reflux disease)    Hyperlipidemia    Hypertension    Hypokalemia    Hypomagnesemia    OSA on CPAP    10-12 years ago   Vitamin D deficiency     Past Surgical History:  Procedure Laterality Date   ANAL FISSURE REPAIR  2007   IR RADIOLOGIST EVAL & MGMT  05/06/2023   TONSILLECTOMY Bilateral    As a child   VENTRAL HERNIA REPAIR  04/13/12   Dr. Michaell Cowing, ventral hernia repair w/ mesh    Allergies: Crestor [rosuvastatin]  Medications: Prior to Admission medications   Medication Sig Start Date End Date Taking? Authorizing Provider  ALPRAZolam Prudy Feeler) 1 MG tablet Take     1/2 to 1 tablet        2 to 3 x /day          as needed for Anxiety attack Patient taking differently: Take 1 mg by mouth every morning. 01/02/20   Lucky Cowboy, MD  aspirin 81 MG tablet Take 81 mg by mouth daily.    [provider]  atorvastatin (LIPITOR) 40 MG tablet Take 1 tablet (40 mg total) by mouth daily. for cholesterol 12/08/22   Adela Glimpse, NP  BD INSULIN SYRINGE U/F 31G X 5/16" 0.5 ML MISC INJECT INSULIN TWICE A DAY AS DIRECTED 10/11/19   Elder Negus, NP   Cholecalciferol (VITAMIN D) 125 MCG (5000 UT) CAPS Take 10,000 Units by mouth daily. Patient taking differently: Take 5,000 Units by mouth daily. 09/22/18   Judd Gaudier, NP  Continuous Glucose Sensor (DEXCOM G7 SENSOR) MISC Use 1 sensor every 10 days 11/04/22   Carlus Pavlov, MD  Cyanocobalamin (B-12) 2500 MCG SUBL Place 2,500 mcg under the tongue daily.    [provider]  finasteride (PROSCAR) 5 MG tablet Take 5 mg by mouth daily.    [provider]  glucose 4 GM chewable tablet Chew 1 tablet (4 g total) by mouth as needed for low blood sugar. 08/09/15   Quentin Mulling R, PA-C  glucose blood (FREESTYLE TEST STRIPS) test strip Use 1- 2 times a day with the freestyle libre 14.  CGM glucometer 09/23/19   Carlus Pavlov, MD  HUMALOG 100 UNIT/ML injection Use up to 80 units a day in the insulin pump 03/06/23   Carlus Pavlov, MD  injection device for insulin (CEQUR SIMPLICITY 2U) DEVI 1 each by Other route every 3 (three) days. 11/04/22   Carlus Pavlov, MD  Injection Device for Insulin (CEQUR SIMPLICITY INSERTER) MISC Use as advised 11/26/21   Carlus Pavlov, MD  insulin glargine, 2 Unit Dial, (TOUJEO MAX SOLOSTAR) 300 UNIT/ML Solostar Pen Inject 44 Units into the skin daily. 11/04/22  Emilie Harden, MD  Magnesium 500 MG TABS Take 500 mg by mouth daily.    [provider]  metFORMIN (GLUCOPHAGE-XR) 500 MG 24 hr tablet TAKE 1 TABLET TWICE A DAY WITH MEALS FOR DIABETES 11/04/22   Emilie Harden, MD  olmesartan-hydrochlorothiazide (BENICAR HCT) 40-12.5 MG tablet Take 0.5 tablets by mouth 2 (two) times daily. 12/16/22   Walker, Caitlin S, NP  omeprazole (PRILOSEC) 20 MG capsule TAKE 1 CAPSULE DAILY FOR INDIGESTION AND HEARTBURN 12/08/22   Cranford, Tonya, NP  potassium chloride SA (KLOR-CON M) 20 MEQ tablet TAKE 1 TABLET BY MOUTH TWICE  DAILY FOR POTASSIUM 12/08/22   Cranford, Tonya, NP  sildenafil (VIAGRA) 25 MG tablet Take 25 mg by mouth daily as needed for  erectile dysfunction.    [provider]  tamsulosin (FLOMAX) 0.4 MG CAPS capsule Take 0.8 mg by mouth daily.    [provider]  tirzepatide Florence Hunt) 10 MG/0.5ML Pen Inject 10 mg into the skin once a week. 03/06/23   Emilie Harden, MD     Family History  Problem Relation Age of Onset   Atrial fibrillation Mother    Kidney cancer Father 69       kidney   Stroke Maternal Grandmother    Dementia Paternal Grandmother        Early 16s   Cancer Paternal Grandfather        Unsure of age or type   Cancer Paternal Uncle 1    Social History   Socioeconomic History   Marital status: Widowed    Spouse name: Not on file   Number of children: 2   Years of education: Not on file   Highest education level: Not on file  Occupational History   Not on file  Tobacco Use   Smoking status: Never   Smokeless tobacco: Never  Vaping Use   Vaping status: Never Used  Substance and Sexual Activity   Alcohol use: Yes    Alcohol/week: 3.0 - 5.0 standard drinks of alcohol    Types: 3 - 5 Standard drinks or equivalent per week    Comment: weekly   Drug use: No   Sexual activity: Yes    Partners: Female  Other Topics Concern   Not on file  Social History Narrative   Not on file   Social Drivers of Health   Financial Resource Strain: Not on file  Food Insecurity: Not on file  Transportation Needs: Not on file  Physical Activity: Insufficiently Active (08/04/2017)   Exercise Vital Sign    Days of Exercise per Week: 1 day    Minutes of Exercise per Session: 50 min  Stress: No Stress Concern Present (09/21/2018)   [employer] of Occupational Health - Occupational Stress Questionnaire    Feeling of Stress : Only a little  Social Connections: Not on file       Review of Systems  Vital Signs:   Advance Care Plan:  Physical Exam  Imaging: No results found.  Labs:  CBC: Recent Labs    06/16/22 1626 10/27/22 1552 12/29/22 1548  WBC 6.4 6.3 6.9  HGB  14.9 15.0 14.8  HCT 44.6 44.5 44.9  PLT 233 217 221    COAGS: No results for input(s): "INR", "APTT" in the last 8760 hours.  BMP: Recent Labs    06/16/22 1626 10/27/22 1552 12/29/22 1548 05/07/23 1531  NA 141 138 138 139  K 3.4* 3.3* 3.7 3.6  CL 102 101 99 98  CO2 29 28  26 27  GLUCOSE 62* 110* 138* 230*  BUN 17 13 18 14   CALCIUM 9.0 9.3 9.3 9.4  CREATININE 0.94 1.05 1.25 1.04    LIVER FUNCTION TESTS: Recent Labs    06/16/22 1626 10/27/22 1552 12/29/22 1548 05/07/23 1531  BILITOT 0.9 1.0 1.2 1.0  AST 16 16 14 15   ALT 13 14 12 16   ALKPHOS  --   --   --  76  PROT 6.3 6.3 6.4 6.7  ALBUMIN  --   --   --  4.1    TUMOR MARKERS: No results for input(s): "AFPTM", "CEA", "CA199", "CHROMGRNA" in the last 8760 hours.  Assessment and Plan: 68 y.o. male with PMH sig for DM, GERD, HLD, HTN, OSA, and BPH with LUTS/elevated PSA with neg bx 01/31/22. He underwent consultation with Dr. Darylene Epley on 05/06/23 to discuss treatment options for his longstanding BPH and was deemed an appropriate candidate for bilateral prostate artery embolization. He presents today for the procedure.Risks and benefits of procedure were discussed with the patient including, but not limited to bleeding, infection, vascular injury or contrast induced renal failure.  This interventional procedure involves the use of X-rays and because of the nature of the planned procedure, it is possible that we will have prolonged use of X-ray fluoroscopy.  Potential radiation risks to you include (but are not limited to) the following: - A slightly elevated risk for cancer  several years later in life. This risk is typically less than 0.5% percent. This risk is low in comparison to the normal incidence of human cancer, which is 33% for women and 50% for men according to the [employer] Society. - Radiation induced injury can include skin redness, resembling a rash, tissue breakdown / ulcers and hair loss (which can be  temporary or permanent).   The likelihood of either of these occurring depends on the difficulty of the procedure and whether you are sensitive to radiation due to previous procedures, disease, or genetic conditions.   IF your procedure requires a prolonged use of radiation, you will be notified and given written instructions for further action.  It is your responsibility to monitor the irradiated area for the 2 weeks following the procedure and to notify your physician if you are concerned that you have suffered a radiation induced injury.    All of the patient's questions were answered, patient is agreeable to proceed.  Consent signed and in chart.      Thank you for allowing our service to participate in Harry [name] 's care.  Electronically Signed: D. Honore Lux, PA-C   06/16/2023, 4:18 PM      I spent a total of   25 Minutes in face to face in clinical consultation, greater than 50% of which was counseling/coordinating care for bilateral prostate artery embolization

## 2023-06-17 ENCOUNTER — Ambulatory Visit (HOSPITAL_COMMUNITY)
Admission: RE | Admit: 2023-06-17 | Discharge: 2023-06-17 | Disposition: A | Discharge status: Home / Self Care | Source: Ambulatory Visit | Attending: Interventional Radiology | Admitting: Interventional Radiology

## 2023-06-17 ENCOUNTER — Encounter (HOSPITAL_COMMUNITY): Payer: Self-pay

## 2023-06-17 ENCOUNTER — Other Ambulatory Visit (HOSPITAL_COMMUNITY): Payer: Self-pay | Admitting: Interventional Radiology

## 2023-06-17 ENCOUNTER — Other Ambulatory Visit (HOSPITAL_COMMUNITY): Payer: Self-pay

## 2023-06-17 ENCOUNTER — Other Ambulatory Visit: Payer: Self-pay

## 2023-06-17 DIAGNOSIS — R972 Elevated prostate specific antigen [PSA]: Secondary | ICD-10-CM | POA: Diagnosis not present

## 2023-06-17 DIAGNOSIS — N401 Enlarged prostate with lower urinary tract symptoms: Secondary | ICD-10-CM

## 2023-06-17 DIAGNOSIS — N400 Enlarged prostate without lower urinary tract symptoms: Secondary | ICD-10-CM | POA: Insufficient documentation

## 2023-06-17 HISTORY — PX: IR EMBO TUMOR ORGAN ISCHEMIA INFARCT INC GUIDE ROADMAPPING: IMG5449

## 2023-06-17 HISTORY — PX: IR US GUIDE VASC ACCESS RIGHT: IMG2390

## 2023-06-17 HISTORY — PX: IR US GUIDE VASC ACCESS LEFT: IMG2389

## 2023-06-17 HISTORY — PX: IR ANGIOGRAM PELVIS SELECTIVE OR SUPRASELECTIVE: IMG661

## 2023-06-17 HISTORY — PX: IR 3D INDEPENDENT WKST: IMG2385

## 2023-06-17 HISTORY — PX: IR ANGIOGRAM SELECTIVE EACH ADDITIONAL VESSEL: IMG667

## 2023-06-17 LAB — CBC WITH DIFFERENTIAL/PLATELET
Abs Immature Granulocytes: 0 10*3/uL (ref 0.00–0.07)
Basophils Absolute: 0.1 10*3/uL (ref 0.0–0.1)
Basophils Relative: 1 %
Eosinophils Absolute: 0.1 10*3/uL (ref 0.0–0.5)
Eosinophils Relative: 2 %
HCT: 46.2 % (ref 39.0–52.0)
Hemoglobin: 15.2 g/dL (ref 13.0–17.0)
Immature Granulocytes: 0 %
Lymphocytes Relative: 20 %
Lymphs Abs: 1.2 10*3/uL (ref 0.7–4.0)
MCH: 28.1 pg (ref 26.0–34.0)
MCHC: 32.9 g/dL (ref 30.0–36.0)
MCV: 85.4 fL (ref 80.0–100.0)
Monocytes Absolute: 0.6 10*3/uL (ref 0.1–1.0)
Monocytes Relative: 9 %
Neutro Abs: 4.3 10*3/uL (ref 1.7–7.7)
Neutrophils Relative %: 68 %
Platelets: 216 10*3/uL (ref 150–400)
RBC: 5.41 MIL/uL (ref 4.22–5.81)
RDW: 13.5 % (ref 11.5–15.5)
WBC: 6.3 10*3/uL (ref 4.0–10.5)
nRBC: 0 % (ref 0.0–0.2)

## 2023-06-17 LAB — GLUCOSE, CAPILLARY
Glucose-Capillary: 195 mg/dL — ABNORMAL HIGH (ref 70–99)
Glucose-Capillary: 81 mg/dL (ref 70–99)

## 2023-06-17 LAB — PSA: Prostatic Specific Antigen: 3.26 ng/mL (ref 0.00–4.00)

## 2023-06-17 LAB — BASIC METABOLIC PANEL WITH GFR
Anion gap: 8 (ref 5–15)
BUN: 15 mg/dL (ref 8–23)
CO2: 27 mmol/L (ref 22–32)
Calcium: 8.8 mg/dL — ABNORMAL LOW (ref 8.9–10.3)
Chloride: 99 mmol/L (ref 98–111)
Creatinine, Ser: 1.05 mg/dL (ref 0.61–1.24)
GFR, Estimated: >60 mL/min (ref >60)
Glucose, Bld: 188 mg/dL — ABNORMAL HIGH (ref 70–99)
Potassium: 3.9 mmol/L (ref 3.5–5.1)
Sodium: 134 mmol/L — ABNORMAL LOW (ref 135–145)

## 2023-06-17 LAB — PROTIME-INR
INR: 1 (ref 0.8–1.2)
Prothrombin Time: 13.7 s (ref 11.4–15.2)

## 2023-06-17 MED ORDER — SODIUM CHLORIDE 0.9 % IV SOLN
2.0000 g | Freq: Once | INTRAVENOUS | Status: AC
  Administered 2023-06-17: 2 g via INTRAVENOUS
  Filled 2023-06-17: qty 20

## 2023-06-17 MED ORDER — IOHEXOL 300 MG/ML  SOLN
50.0000 mL | Freq: Once | INTRAMUSCULAR | Status: AC | PRN
  Administered 2023-06-17: 20 mL via INTRA_ARTERIAL

## 2023-06-17 MED ORDER — FENTANYL CITRATE (PF) 100 MCG/2ML IJ SOLN
INTRAMUSCULAR | Status: AC
  Filled 2023-06-17: qty 2

## 2023-06-17 MED ORDER — FENTANYL CITRATE (PF) 100 MCG/2ML IJ SOLN
INTRAMUSCULAR | Status: AC | PRN
  Administered 2023-06-17 (×4): 50 ug via INTRAVENOUS

## 2023-06-17 MED ORDER — OXYCODONE HCL 5 MG PO TABS
5.0000 mg | ORAL_TABLET | Freq: Four times a day (QID) | ORAL | 0 refills | Status: DC | PRN
  Filled 2023-06-17: qty 10, 3d supply, fill #0

## 2023-06-17 MED ORDER — CHLORHEXIDINE GLUCONATE CLOTH 2 % EX PADS: 6.0000 | MEDICATED_PAD | Freq: Every day | CUTANEOUS | Status: DC

## 2023-06-17 MED ORDER — LIDOCAINE HCL 1 % IJ SOLN
INTRAMUSCULAR | Status: AC
  Filled 2023-06-17: qty 20

## 2023-06-17 MED ORDER — SOLIFENACIN SUCCINATE 5 MG PO TABS
5.0000 mg | ORAL_TABLET | Freq: Every day | ORAL | 0 refills | Status: DC
  Filled 2023-06-17: qty 7, 7d supply, fill #0

## 2023-06-17 MED ORDER — LIDOCAINE HCL 1 % IJ SOLN
20.0000 mL | Freq: Once | INTRAMUSCULAR | Status: AC
  Administered 2023-06-17: 10 mL via INTRADERMAL

## 2023-06-17 MED ORDER — ELMIRON 100 MG PO CAPS
100.0000 mg | ORAL_CAPSULE | Freq: Three times a day (TID) | ORAL | 0 refills | Status: DC
  Filled 2023-06-17: qty 21, 7d supply, fill #0

## 2023-06-17 MED ORDER — NAPROXEN 250 MG PO TABS
500.0000 mg | ORAL_TABLET | Freq: Once | ORAL | Status: AC
  Administered 2023-06-17: 500 mg via ORAL
  Filled 2023-06-17 (×2): qty 2

## 2023-06-17 MED ORDER — MIDAZOLAM HCL 2 MG/2ML IJ SOLN
INTRAMUSCULAR | Status: AC
Start: 2023-06-17 — End: ?
  Filled 2023-06-17: qty 2

## 2023-06-17 MED ORDER — SODIUM CHLORIDE 0.9 % IV SOLN
INTRAVENOUS | Status: AC
  Filled 2023-06-17: qty 20

## 2023-06-17 MED ORDER — IOHEXOL 300 MG/ML  SOLN
100.0000 mL | Freq: Once | INTRAMUSCULAR | Status: AC | PRN
  Administered 2023-06-17: 80 mL via INTRA_ARTERIAL

## 2023-06-17 MED ORDER — MIDAZOLAM HCL 2 MG/2ML IJ SOLN
INTRAMUSCULAR | Status: AC
  Filled 2023-06-17: qty 2

## 2023-06-17 MED ORDER — IOHEXOL 300 MG/ML  SOLN
100.0000 mL | Freq: Once | INTRAMUSCULAR | Status: AC | PRN
  Administered 2023-06-17: 31 mL via INTRA_ARTERIAL

## 2023-06-17 MED ORDER — CIPROFLOXACIN HCL 500 MG PO TABS
500.0000 mg | ORAL_TABLET | Freq: Two times a day (BID) | ORAL | 0 refills | Status: DC
  Filled 2023-06-17: qty 14, 7d supply, fill #0

## 2023-06-17 MED ORDER — MIDAZOLAM HCL 2 MG/2ML IJ SOLN
INTRAMUSCULAR | Status: AC | PRN
  Administered 2023-06-17: .5 mg via INTRAVENOUS
  Administered 2023-06-17 (×3): 1 mg via INTRAVENOUS
  Administered 2023-06-17: .5 mg via INTRAVENOUS

## 2023-06-17 MED ORDER — SODIUM CHLORIDE 0.9 % IV SOLN
INTRAVENOUS | Status: DC
Start: 2023-06-17 — End: 2023-06-18

## 2023-06-17 NOTE — Discharge Instructions (Signed)
 Please call Interventional Radiology clinic [phone] with any questions or concerns.  You may remove your dressing and shower tomorrow.  After the procedure, it is common to have: Increased frequency of urination Mild pressure or discomfort in the low belly Blood in the urine and/or painful urination for 7-14 days  Blood in the semen for 7-21 days Pressure, pain, or the urge to have a bowel movement when urinating and/or a small amount of blood in the stool for 4-7 days Low-grade fever (temperature less than 101.5 degrees Fahrenheit or 38.6 degrees Celsius) for 4-7 days Tenderness and/or bruising around the puncture site. Bruising can take 2-3 weeks to go away completely. Please call immediately if you feel a lump that is growing or varies with your pulse  Follow these instructions at home:  Medication: Do not use Aspirin or ibuprofen products, such as Advil or Motrin, as it may increase bleeding You may resume your usual medications as ordered by your doctor If your doctor prescribed antibiotics, take them as directed. Do not stop taking them just because you feel better  Eating and drinking: Drink plenty of liquids to keep your urine pale yellow You can resume your regular diet as directed by your doctor   Activity For procedures where we entered your artery from the top of your leg (groin):  Avoid strenuous activity, such as climbing long flights of stairs, for 24 hours after your procedure  No heavy lifting (greater than 15-20 pounds or 6-9 kg) for 7 days or until the puncture site heels Do not take baths, swim, or use a hot tub until your health care provider approves. Take showers only Keep all follow-up visits as told by your doctor  Care of the procedure site Follow instructions from your health care provider about how to take care of the puncture site.  Wash your hands with soap and water before you change your bandage (dressing). If soap and water are not available, use  hand sanitizer Change your dressing as told by your health care provider Leave stitches (sutures), skin glue, or adhesive strips in place. These skin closures may need to stay in place for 2 weeks or longer. If adhesive strip edges start to loosen and curl up, you may trim the loose edges. Do not remove adhesive strips completely unless your health care provider tells you to do that Check your puncture site every day for signs of infection. Check for: More redness, swelling, or pain Warmth/heat at puncture site Pus or a bad odor  Contact a health care provider if: You have a fever You have more redness, swelling, or pain around your incision You have more fluid or blood coming from your incision site Your incision feels warm to the touch You have pus or a bad smell coming from your incision You have a rash You have nausea, or you cannot eat or drink anything without vomiting  Get help right away if: You have trouble breathing You have chest pain You have severe pain in your abdomen, and it does not get better with medicine You have leg pain or leg swelling You feel dizzy, or you faint    Moderate Conscious Sedation, Adult, Care After  This sheet gives you information about how to care for yourself after your procedure. Your health care provider may also give you more specific instructions. If you have problems or questions, contact your health care provider. What can I expect after the procedure? After the procedure, it is common to have:  Sleepiness for several hours. Impaired judgment for several hours. Difficulty with balance. Vomiting if you eat too soon. Follow these instructions at home: For the time period you were told by your health care provider:     Rest. Do not participate in activities where you could fall or become injured. Do not drive or use machinery. Do not drink alcohol. Do not take sleeping pills or medicines that cause drowsiness. Do not make  important decisions or sign legal documents. Do not take care of children on your own. Eating and drinking  Follow the diet recommended by your health care provider. Drink enough fluid to keep your urine pale yellow. If you vomit: Drink water, juice, or soup when you can drink without vomiting. Make sure you have little or no nausea before eating solid foods. General instructions Take over-the-counter and prescription medicines only as told by your health care provider. Have a responsible adult stay with you for the time you are told. It is important to have someone help care for you until you are awake and alert. Do not smoke. Keep all follow-up visits as told by your health care provider. This is important. Contact a health care provider if: You are still sleepy or having trouble with balance after 24 hours. You feel light-headed. You keep feeling nauseous or you keep vomiting. You develop a rash. You have a fever. You have redness or swelling around the IV site. Get help right away if: You have trouble breathing. You have new-onset confusion at home. Summary After the procedure, it is common to feel sleepy, have impaired judgment, or feel nauseous if you eat too soon. Rest after you get home. Know the things you should not do after the procedure. Follow the diet recommended by your health care provider and drink enough fluid to keep your urine pale yellow. Get help right away if you have trouble breathing or new-onset confusion at home. This information is not intended to replace advice given to you by your health care provider. Make sure you discuss any questions you have with your health care provider. Document Revised: 06/17/2019 Document Reviewed: 01/13/2019 Elsevier Patient Education  2023 [employer].

## 2023-06-17 NOTE — Progress Notes (Signed)
 1550 CBG check 80, skin warm and dry.  Gave a snack peanut butter and graham cracker.

## 2023-06-17 NOTE — Procedures (Signed)
 Vascular and Interventional Radiology Procedure Note  Patient: Harry [name] DOB: 13-May-1955 Medical Record Number: [id number] Note Date/Time: 06/17/23 12:28 PM   Performing Physician: Art Largo, MD Assistant(s): None  Diagnosis: BPH w LUTS.    Procedure(s):  PELVIC ARTERIOGRAPHY PROSTATE ARTERY EMBOLIZATION    Anesthesia: Conscious Sedation Complications: None Estimated Blood Loss: Minimal Specimens: None   Findings:  - access via the RIGHT femoral artery. - Prostatomegaly with bilateral tortuous prostatic arteries.  - Successful 100-300 and 300-500 um microparticle embolization of bilateral prostatic arteries to stasis - Additional coil embolization of the prostatic artery origins. - AngioSeal closure at the R groin with distal RLE pulses at the end of the case.   Plan: - Post sheath removal precautions.  - Bedrest with RLE straight x2hrs.  Final report to follow once all images are reviewed and compared with previous studies.  See detailed dictation with images in PACS. The patient tolerated the procedure well without incident or complication and was returned to Recovery in stable condition.    Art Largo, MD [facility]   Pager. [phone] Clinic. [phone]

## 2023-06-17 NOTE — Progress Notes (Signed)
 1540 Foley catheter removed.

## 2023-06-18 LAB — GLUCOSE, CAPILLARY: Glucose-Capillary: 80 mg/dL (ref 70–99)

## 2023-06-19 ENCOUNTER — Other Ambulatory Visit: Payer: Self-pay

## 2023-06-22 ENCOUNTER — Telehealth: Payer: Self-pay

## 2023-06-22 ENCOUNTER — Other Ambulatory Visit (HOSPITAL_COMMUNITY): Payer: Self-pay

## 2023-06-22 NOTE — Telephone Encounter (Signed)
 Patient called and left a VM stating that he is wanting to make an appt with Stana Ear about changing pumps.

## 2023-06-22 NOTE — Telephone Encounter (Signed)
Patient called and appointment scheduled.

## 2023-06-23 ENCOUNTER — Other Ambulatory Visit: Payer: Self-pay

## 2023-06-23 ENCOUNTER — Other Ambulatory Visit (HOSPITAL_COMMUNITY): Payer: Self-pay

## 2023-06-29 ENCOUNTER — Encounter: Attending: Internal Medicine | Admitting: Nutrition

## 2023-06-29 DIAGNOSIS — E139 Other specified diabetes mellitus without complications: Secondary | ICD-10-CM | POA: Diagnosis not present

## 2023-06-29 NOTE — Patient Instructions (Addendum)
Read over information given and call if questions 

## 2023-06-29 NOTE — Progress Notes (Signed)
 Patient is here to day to discuss insulin  pump therapy.  He is currently wearing a CeQur device to deliver his Humalog .  We discussed how pumps work to deliver insulin , and he was show the different pumps.  We discussed the advantages and disadvantages of pump therapy, as well as each individual pump.  His is interested in both the Tandem Control IQ and the Beta Bionic pump.  He is currently wearing the Dexcom G7 pump and was told that this can be used with either pump he chooses.  He was given brochures on all pump and told to read them over and call if questions.  He agreed to do this and had no final questions

## 2023-07-02 DIAGNOSIS — M65341 Trigger finger, right ring finger: Secondary | ICD-10-CM | POA: Diagnosis not present

## 2023-07-06 ENCOUNTER — Encounter: Payer: Self-pay | Admitting: Internal Medicine

## 2023-07-06 ENCOUNTER — Ambulatory Visit: Payer: BC Managed Care – PPO | Admitting: Internal Medicine

## 2023-07-06 VITALS — BP 130/60 | HR 77 | Ht 74.0 in | Wt 240.4 lb

## 2023-07-06 DIAGNOSIS — E139 Other specified diabetes mellitus without complications: Secondary | ICD-10-CM

## 2023-07-06 DIAGNOSIS — E1322 Other specified diabetes mellitus with diabetic chronic kidney disease: Secondary | ICD-10-CM | POA: Diagnosis not present

## 2023-07-06 DIAGNOSIS — E785 Hyperlipidemia, unspecified: Secondary | ICD-10-CM | POA: Diagnosis not present

## 2023-07-06 DIAGNOSIS — E1169 Type 2 diabetes mellitus with other specified complication: Secondary | ICD-10-CM

## 2023-07-06 DIAGNOSIS — N182 Chronic kidney disease, stage 2 (mild): Secondary | ICD-10-CM

## 2023-07-06 DIAGNOSIS — E1369 Other specified diabetes mellitus with other specified complication: Secondary | ICD-10-CM

## 2023-07-06 DIAGNOSIS — E66811 Obesity, class 1: Secondary | ICD-10-CM | POA: Diagnosis not present

## 2023-07-06 LAB — POCT GLYCOSYLATED HEMOGLOBIN (HGB A1C): Hemoglobin A1C: 6.7 % — AB (ref 4.0–5.6)

## 2023-07-06 NOTE — Progress Notes (Signed)
 Patient ID: Harry [name], male   DOB: [birth year], 68 y.o.   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 Last visit 4 months ago. 10/01/2021: changed Insurance from [place] to [employer].  Interim history: No increased urination but does have nocturia, blurry vision, nausea, chest pain.  Since last visit, he had TURP.  He is feeling better. He met with the diabetes educator since last visit and decided for the iLet insulin  pump  Reviewed HbA1c levels: Lab Results  Component Value Date   HGBA1C 6.9 (A) 03/06/2023   HGBA1C 6.9 11/04/2022   HGBA1C 6.9 (H) 06/16/2022   He is on: - Metformin  ER 1000 >> 500 mg in am and 500 mg at night -decreased 2/2 diarrhea -now tolerated well - Mounjaro  5 >> 7.5 mg daily >> Ozempic  1 mg weekly (due to lack of availability of Mounjaro ) >> Mounjaro  7.5 >> 10 mg  weekly  - Toujeo  42-44 >> 44 units at bedtime - Lispro >> Novolog  - 15 min before meals: in the Cequr pump - he likes it  5 >> 6-8 units in am 12-15 >> 12-20 >> 10-14 units before lunch 12-18 >> 12-20 >> 10-14 units before dinner Correction at bedtime:4-5 units Sliding scale of Humalog  >> Lispro >> Novolog  >> lispro >> Humalog : - 141-160: + 2 units  - 161-180: + 3 units  - 181-200: + 4 units  - 201-240: + 5 units  - 241-280: + 6 units  - 281-320: + 7 units  - 321-360: + 8 units  - >361: + 10 units He had burning at the inj site with Lyumjev . He was previously on Trulicity . He was previously on Lantus  and Tresiba .  Also, Humalog  brand-name.  He checks his sugars more than 4 times a day with his CGM:    Previously:  Previously:   Lowest sugar was 30s >> .Harry [name]. 60s >> 50 >> 60s; he has hypoglycemia awareness in the 60s. Highest sugar was in the HI >> .Harry AasAaron [name] 400s >> 375 >> 300.  Glucometer: one touch ultra 2  Pt's  meals are: - Breakfast: Glucerna, banana >> oatmeal >> English muffin or boiled egg or oatmeal - Lunch: eats out - salad with chicken; chicken burrito; sandwich; chick-fil-a >> sandwich with either tomatoes or peanut butter and jelly + hold milk - Dinner: meat + veggie + starch - Snacks: potato chips, fruit, icecream No sodas.  -+ Mild CKD, last BUN/creatinine:  Lab Results  Component Value Date   BUN 15 06/17/2023   BUN 14 05/07/2023   CREATININE 1.05 06/17/2023   CREATININE 1.04 05/07/2023   Lab Results  Component Value Date   MICRALBCREAT 2 10/27/2022   MICRALBCREAT 2 10/05/2020   MICRALBCREAT 2 09/29/2019   MICRALBCREAT 2 09/21/2018   MICRALBCREAT 7 08/04/2017   MICRALBCREAT 3 06/02/2016   MICRALBCREAT 3 04/18/2015   MICRALBCREAT 2.7 04/06/2014   MICRALBCREAT 3.3 04/06/2013  On losartan .  -+ HL; last set of lipids: Lab Results  Component Value Date   CHOL 149 05/07/2023   HDL 62.40 05/07/2023   LDLCALC 64 05/07/2023   TRIG 114.0 05/07/2023   CHOLHDL 2 05/07/2023  On Lipitor 40.  - last eye exam was 03/18/2022: No DR - pending in 10/2023.  -No numbness and tingling in his feet.  Foot exam 11/04/2022.  He  also has a history of HTN, GERD, OSA-on CPAP.  His wife passed away with cancer in May 12, 2018.  ROS: + See HPI  I reviewed pt's medications, allergies, PMH, social hx, family hx, and changes were documented in the history of present illness. Otherwise, unchanged from my initial visit note.  Past Medical History:  Diagnosis Date   Diabetes mellitus without complication (HCC) 1987   insulin  requiring   Elevated PSA 09/22/2018   [facility] is following    GERD (gastroesophageal reflux disease)    Hyperlipidemia    Hypertension    Hypokalemia    Hypomagnesemia    OSA on CPAP    10-12 years ago   Vitamin D  deficiency    Past Surgical History:  Procedure Laterality Date   ANAL FISSURE REPAIR  2007   IR 3D INDEPENDENT WKST  06/17/2023   IR ANGIOGRAM  PELVIS SELECTIVE OR SUPRASELECTIVE  06/17/2023   IR ANGIOGRAM SELECTIVE EACH ADDITIONAL VESSEL  06/17/2023   IR ANGIOGRAM SELECTIVE EACH ADDITIONAL VESSEL  06/17/2023   IR EMBO TUMOR ORGAN ISCHEMIA INFARCT INC GUIDE ROADMAPPING  06/17/2023   IR RADIOLOGIST EVAL & MGMT  05/06/2023   IR US  GUIDE VASC ACCESS LEFT  06/17/2023   IR US  GUIDE VASC ACCESS RIGHT  06/17/2023   TONSILLECTOMY Bilateral    As a child   VENTRAL HERNIA REPAIR  04/13/12   Dr. Hershell Lose, ventral hernia repair w/ mesh   Social History   Social History   Marital status: Married    Spouse name: N/A   Number of children: 2   Occupational History   estimator   Social History Main Topics   Smoking status: Never Smoker   Smokeless tobacco: Not on file   Alcohol use 0.0 oz/week    2 - 5 Cans of beer per week   Current Outpatient Medications on File Prior to Visit  Medication Sig Dispense Refill   ALPRAZolam  (XANAX ) 1 MG tablet Take     1/2 to 1 tablet        2 to 3 x /day          as needed for Anxiety attack (Patient taking differently: Take 1 mg by mouth every morning.) 30 tablet 0   aspirin 81 MG tablet Take 81 mg by mouth daily.     atorvastatin  (LIPITOR) 40 MG tablet Take 1 tablet (40 mg total) by mouth daily. for cholesterol 90 tablet 3   BD INSULIN  SYRINGE U/F 31G X 5/16" 0.5 ML MISC INJECT INSULIN  TWICE A DAY AS DIRECTED 200 each 3   Cholecalciferol (VITAMIN D ) 125 MCG (5000 UT) CAPS Take 10,000 Units by mouth daily. (Patient taking differently: Take 5,000 Units by mouth daily.) 60 capsule 0   ciprofloxacin  (CIPRO ) 500 MG tablet Take 1 tablet (500 mg total) by mouth 2 (two) times daily. 14 tablet 0   Continuous Glucose Sensor (DEXCOM G7 SENSOR) MISC Use 1 sensor every 10 days 9 each 3   Cyanocobalamin  (B-12) 2500 MCG SUBL Place 2,500 mcg under the tongue daily.     finasteride (PROSCAR) 5 MG tablet Take 5 mg by mouth daily.     glucose 4 GM chewable tablet Chew 1 tablet (4 g total) by mouth as needed for low blood sugar. 30  tablet 12   glucose blood (FREESTYLE TEST STRIPS) test strip Use 1- 2 times a day with the freestyle libre 14.  CGM glucometer 150 each 3   HUMALOG  100 UNIT/ML injection Use up to 80  units a day in the insulin  pump 60 mL 3   injection device for insulin  (CEQUR SIMPLICITY 2U) DEVI 1 each by Other route every 3 (three) days. 30 each 1   Injection Device for Insulin  (CEQUR SIMPLICITY INSERTER) MISC Use as advised 1 each 0   insulin  glargine, 2 Unit Dial, (TOUJEO  MAX SOLOSTAR) 300 UNIT/ML Solostar Pen Inject 44 Units into the skin daily. 18 mL 3   Magnesium 500 MG TABS Take 500 mg by mouth daily.     metFORMIN  (GLUCOPHAGE -XR) 500 MG 24 hr tablet TAKE 1 TABLET TWICE A DAY WITH MEALS FOR DIABETES 180 tablet 3   olmesartan -hydrochlorothiazide  (BENICAR  HCT) 40-12.5 MG tablet Take 0.5 tablets by mouth 2 (two) times daily. 90 tablet 3   omeprazole  (PRILOSEC) 20 MG capsule TAKE 1 CAPSULE DAILY FOR INDIGESTION AND HEARTBURN 90 capsule 3   oxyCODONE  (ROXICODONE ) 5 MG immediate release tablet Take 1 tablet (5 mg total) by mouth every 6 (six) hours as needed for severe pain (pain score 7-10). 10 tablet 0   pentosan polysulfate (ELMIRON ) 100 MG capsule Take 1 capsule (100 mg total) by mouth 3 (three) times daily. 21 capsule 0   potassium chloride  SA (KLOR-CON  M) 20 MEQ tablet TAKE 1 TABLET BY MOUTH TWICE  DAILY FOR POTASSIUM 180 tablet 3   sildenafil (VIAGRA) 25 MG tablet Take 25 mg by mouth daily as needed for erectile dysfunction.     solifenacin  (VESICARE ) 5 MG tablet Take 1 tablet (5 mg total) by mouth daily. 7 tablet 0   tamsulosin  (FLOMAX ) 0.4 MG CAPS capsule Take 0.8 mg by mouth daily.     tirzepatide  (MOUNJARO ) 10 MG/0.5ML Pen Inject 10 mg into the skin once a week. 6 mL 3   Current Facility-Administered Medications on File Prior to Visit  Medication Dose Route Frequency Provider Last Rate Last Admin   ipratropium-albuterol  (DUONEB) 0.5-2.5 (3) MG/3ML nebulizer solution 3 mL  3 mL Nebulization Once  Cranford, Tonya, NP       Allergies  Allergen Reactions   Crestor [Rosuvastatin] Cough   Family History  Problem Relation Age of Onset   Atrial fibrillation Mother    Kidney cancer Father 21       kidney   Stroke Maternal Grandmother    Dementia Paternal Grandmother        Early 10s   Cancer Paternal Darin Edinger of age or type   Cancer Paternal Uncle 16   PE: BP 130/60   Pulse 77   Ht 6\' 2"  (1.88 m)   Wt 240 lb 6.4 oz (109 kg)   SpO2 96%   BMI 30.87 kg/m   Wt Readings from Last 10 Encounters:  07/06/23 240 lb 6.4 oz (109 kg)  06/17/23 235 lb (106.6 kg)  05/07/23 240 lb 6.4 oz (109 kg)  03/06/23 249 lb (112.9 kg)  02/03/23 241 lb (109.3 kg)  01/08/23 238 lb (108 kg)  12/29/22 235 lb (106.6 kg)  12/16/22 239 lb 9.6 oz (108.7 kg)  11/04/22 244 lb 3.2 oz (110.8 kg)  10/27/22 239 lb 12.8 oz (108.8 kg)   Constitutional: overweight, in NAD Eyes: no exophthalmos ENT: no masses palpated in neck, no cervical lymphadenopathy Cardiovascular: RRR, No MRG Respiratory: CTA B Musculoskeletal: no deformities Skin: no rashes Neurological: no tremor with outstretched hands  ASSESSMENT: 1. LADA, insulin -dependent, uncontrolled, with complications - CKD stage 2 - ED  - he has an elliptical machine at home  Component  Latest Ref Rng & Units 07/11/2016  Hemoglobin A1C      6.5  Glutamic Acid Decarb Ab     <5 IU/mL 47 (H)  Pancreatic Islet Cell Antibody     <5 JDF Units <5  C-Peptide     0.80 - 3.85 ng/mL 0.37 (L)  Glucose, Fasting     65 - 99 mg/dL 952 (H)   Elevated GAD antibodies and decreased insulin  production. 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 HL  3.  Obesity class I  PLAN:  1. Patient with longstanding, uncontrolled, insulin -dependent diabetes (LADA), on Metformin , GLP-1/GIP receptor agonist, long-acting insulin  and the CeQur simplicity insulin   pump.  On this regimen, HbA1c was stable at last visit, at 6.9%.  Sugars are improving overnight but sometimes dropping too low without a clear reason.  Also sugars were increasing in a stepwise fashion as the day went by despite bolusing correctly before meals.  He was on lispro Was interested in going back to Humalog  as he felt that this was working better for him.  I sent a new prescription to his pharmacy at that time.  I also recommended to try to increase the Mounjaro  dose we discussed about possibly switching to another pump, fully integrated with the CGM and the closed-loop mode.  He was interested in this so I referred him to diabetes education.  After being showed the times available on the market, he opted for t:slim or iLet.  He wanted to check with his insurance to see which one they cover. CGM interpretation: -At today's visit, we reviewed his CGM downloads: It appears that 72% of values are in target range (goal >70%), while 25% are higher than 180 (goal <25%), and 3% are lower than 70 (goal <4%).  The calculated average blood sugar is 146.  The projected HbA1c for the next 3 months (GMI) is 6.8%. -Reviewing the CGM trends, sugars appear to have improved in the last 2 weeks compared to the previous 2 weeks and definitely better compared to last visit. Sugars are more commonly increased after lunch.  At today's visit we discussed about continuing the same dose of insulin  for breakfast, to increase the dose before lunch, which is his largest meal and after which the sugars are highest, and decrease the dose before dinner, as he is dropping his blood sugars occasionally after this meal.  I also advised him to reduce the dose of Toujeo  to avoid blood sugar drops during the night.  However, he decided for the iLET insulin  pump and we discussed about how it functions, how he announces the meals, and the possibility of hyperglycemia in the first week after he started.  We may need to use U200 into the  pump.  For now, he has a lot of Humalog  U100 at home so he will try to use this in the pump. - I advised him to:  Patient Instructions  Please continue: - Metformin  ER 500 mg in am and 500 mg at night -  Mounjaro  10 mg weekly  Decrease: - Toujeo  40 units daily  Change: -  Humalog  - 15 min before meals (CeQur pump): 6-8 units in am 12-14 units before lunch 6-10 units before dinner Sliding scale of  Humalog  - 141-160: + 2 units  - 161-180: + 3 units  - 181-200: + 4 units  - 201-240: + 5 units  - 241-280: +  6 units  - 281-320: + 7 units  - 321-360: + 8 units  - >361: + 10 units  Please return in 3-4 months.     - we checked his HbA1c: 6.7% (lower) - advised to check sugars at different times of the day - 4x a day, rotating check times - advised for yearly eye exams >> he is UTD - return to clinic in 4 months   2. HL -Latest lipid panel was at goal 2 mo ago: Lab Results  Component Value Date   CHOL 149 05/07/2023   HDL 62.40 05/07/2023   LDLCALC 64 05/07/2023   TRIG 114.0 05/07/2023   CHOLHDL 2 05/07/2023  -He continues on Lipitor 40 mg daily without side effects  3.  Obesity class I -We were able to switch from Trulicity  to Mounjaro  for stronger effect on weight and sugars.  However, he was not able to fill Mounjaro  and he was still on Ozempic .  At last visit he was on Mounjaro  and we increased the dose. -He gained 6 pounds before the last visit - He lost 9 pounds since last visit    Emilie Harden, MD PhD [facility]

## 2023-07-06 NOTE — Patient Instructions (Addendum)
 Please continue: - Metformin  ER 500 mg in am and 500 mg at night -  Mounjaro  10 mg weekly  Decrease: - Toujeo  40 units daily  Change: -  Humalog  - 15 min before meals (CeQur pump): 6-8 units in am 12-14 units before lunch 6-10 units before dinner Sliding scale of  Humalog  - 141-160: + 2 units  - 161-180: + 3 units  - 181-200: + 4 units  - 201-240: + 5 units  - 241-280: + 6 units  - 281-320: + 7 units  - 321-360: + 8 units  - >361: + 10 units  Please return in 3-4 months.

## 2023-07-07 ENCOUNTER — Encounter: Payer: Self-pay | Admitting: Internal Medicine

## 2023-07-07 LAB — MICROALBUMIN / CREATININE URINE RATIO
Creatinine, Urine: 126 mg/dL (ref 20–320)
Microalb, Ur: <0.2 mg/dL

## 2023-07-14 ENCOUNTER — Other Ambulatory Visit: Payer: Self-pay | Admitting: Internal Medicine

## 2023-07-14 DIAGNOSIS — E139 Other specified diabetes mellitus without complications: Secondary | ICD-10-CM

## 2023-07-14 MED ORDER — CEQUR SIMPLICITY 2U DEVI: 1 refills | Status: DC

## 2023-07-15 ENCOUNTER — Other Ambulatory Visit (HOSPITAL_COMMUNITY): Payer: Self-pay

## 2023-07-15 ENCOUNTER — Telehealth: Payer: Self-pay

## 2023-07-15 NOTE — Telephone Encounter (Signed)
 Pt needs a PA for CeQur. He is currently out.

## 2023-07-15 NOTE — Telephone Encounter (Signed)
 Pharmacy Patient Advocate Encounter   Received notification from Pt Calls Messages that prior authorization for Cequr simplicity is required/requested.   Insurance verification completed.   The patient is insured through [facility] .   Per test claim: Medication is not eligible for pharmacy benefits and must be billed through medical insurance. As our team only handles pharmacy related prior auths, medical PA's must be submitted by the clinic. Thank you

## 2023-07-17 ENCOUNTER — Ambulatory Visit
Admission: RE | Admit: 2023-07-17 | Discharge: 2023-07-17 | Disposition: A | Discharge status: Home / Self Care | Source: Ambulatory Visit | Attending: Radiology | Admitting: Radiology

## 2023-07-17 DIAGNOSIS — E139 Other specified diabetes mellitus without complications: Secondary | ICD-10-CM | POA: Diagnosis not present

## 2023-07-17 DIAGNOSIS — N401 Enlarged prostate with lower urinary tract symptoms: Secondary | ICD-10-CM

## 2023-07-21 ENCOUNTER — Telehealth: Payer: Self-pay

## 2023-07-21 NOTE — Telephone Encounter (Signed)
 I called and spoke with the patient, His insurance is not allowing him to get the refill on the CeQur because he has received the iLet pump from [employer].

## 2023-07-21 NOTE — Telephone Encounter (Signed)
 Phoned him to let him know that I am not certified to do this, and that I have talked with the rep. And she will contact him to schedule the training.  He was given her number if he does not hear from her today.

## 2023-07-21 NOTE — Telephone Encounter (Signed)
 Noted

## 2023-07-21 NOTE — Telephone Encounter (Signed)
 Patient states that he has been trying to get in contact with you to get scheduled to come in so he can get his pump set up.

## 2023-07-24 ENCOUNTER — Ambulatory Visit
Admission: RE | Admit: 2023-07-24 | Discharge: 2023-07-24 | Disposition: A | Discharge status: Home / Self Care | Source: Ambulatory Visit | Attending: Radiology | Admitting: Radiology

## 2023-07-24 DIAGNOSIS — R339 Retention of urine, unspecified: Secondary | ICD-10-CM | POA: Diagnosis not present

## 2023-07-24 DIAGNOSIS — N401 Enlarged prostate with lower urinary tract symptoms: Secondary | ICD-10-CM | POA: Diagnosis not present

## 2023-07-24 HISTORY — PX: IR RADIOLOGIST EVAL & MGMT: IMG5224

## 2023-07-24 NOTE — Progress Notes (Signed)
 Referring Physician(s): Allred,Darrell K  Supervising Physician: {Supervising Physician:[id number]}  Chief Complaint: The patient is seen in follow up today s/p ***  History of present illness:  ***  Past Medical History:  Diagnosis Date   Diabetes mellitus without complication (HCC) 1987   insulin  requiring   Elevated PSA 09/22/2018   [facility] is following    GERD (gastroesophageal reflux disease)    Hyperlipidemia    Hypertension    Hypokalemia    Hypomagnesemia    OSA on CPAP    10-12 years ago   Vitamin D  deficiency     Past Surgical History:  Procedure Laterality Date   ANAL FISSURE REPAIR  2007   IR 3D INDEPENDENT WKST  06/17/2023   IR ANGIOGRAM PELVIS SELECTIVE OR SUPRASELECTIVE  06/17/2023   IR ANGIOGRAM SELECTIVE EACH ADDITIONAL VESSEL  06/17/2023   IR ANGIOGRAM SELECTIVE EACH ADDITIONAL VESSEL  06/17/2023   IR EMBO TUMOR ORGAN ISCHEMIA INFARCT INC GUIDE ROADMAPPING  06/17/2023   IR RADIOLOGIST EVAL & MGMT  05/06/2023   IR US  GUIDE VASC ACCESS LEFT  06/17/2023   IR US  GUIDE VASC ACCESS RIGHT  06/17/2023   TONSILLECTOMY Bilateral    As a child   VENTRAL HERNIA REPAIR  04/13/12   Dr. Hershell Lose, ventral hernia repair w/ mesh    Allergies: Crestor [rosuvastatin]  Medications: Prior to Admission medications   Medication Sig Start Date End Date Taking? Authorizing Provider  ALPRAZolam  (XANAX ) 1 MG tablet Take     1/2 to 1 tablet        2 to 3 x /day          as needed for Anxiety attack Patient taking differently: Take 1 mg by mouth every morning. 01/02/20   Vangie Genet, MD  aspirin 81 MG tablet Take 81 mg by mouth daily.    [provider]  atorvastatin  (LIPITOR) 40 MG tablet Take 1 tablet (40 mg total) by mouth daily. for cholesterol 12/08/22   Langley Pippin, NP  BD INSULIN  SYRINGE U/F 31G X 5/16" 0.5 ML MISC INJECT INSULIN  TWICE A DAY AS DIRECTED 10/11/19   McClanahan, Kyra, NP  Cholecalciferol (VITAMIN D ) 125 MCG (5000 UT) CAPS Take 10,000 Units by  mouth daily. Patient taking differently: Take 5,000 Units by mouth daily. 09/22/18   Roosevelt Bureau, NP  Continuous Glucose Sensor (DEXCOM G7 SENSOR) MISC Use 1 sensor every 10 days 11/04/22   Emilie Harden, MD  Cyanocobalamin  (B-12) 2500 MCG SUBL Place 2,500 mcg under the tongue daily.    [provider]  finasteride (PROSCAR) 5 MG tablet Take 5 mg by mouth daily.    [provider]  glucose 4 GM chewable tablet Chew 1 tablet (4 g total) by mouth as needed for low blood sugar. 08/09/15   Santina Cull R, PA-C  glucose blood (FREESTYLE TEST STRIPS) test strip Use 1- 2 times a day with the freestyle libre 14.  CGM glucometer 09/23/19   Emilie Harden, MD  HUMALOG  100 UNIT/ML injection Use up to 80 units a day in the insulin  pump 03/06/23   Emilie Harden, MD  injection device for insulin  (CEQUR SIMPLICITY 2U) DEVI Use 1 reservoir every 4 days 07/14/23   Emilie Harden, MD  Injection Device for Insulin  (CEQUR SIMPLICITY INSERTER) MISC Use as advised 11/26/21   Emilie Harden, MD  insulin  glargine, 2 Unit Dial, (TOUJEO  MAX SOLOSTAR) 300 UNIT/ML Solostar Pen Inject 44 Units into the skin daily. 11/04/22   Emilie Harden, MD  Magnesium  500 MG TABS Take 500 mg by mouth daily.    [provider]  metFORMIN  (GLUCOPHAGE -XR) 500 MG 24 hr tablet TAKE 1 TABLET TWICE A DAY WITH MEALS FOR DIABETES 11/04/22   Emilie Harden, MD  olmesartan -hydrochlorothiazide  (BENICAR  HCT) 40-12.5 MG tablet Take 0.5 tablets by mouth 2 (two) times daily. 12/16/22   Clearnce Curia, NP  omeprazole  (PRILOSEC) 20 MG capsule TAKE 1 CAPSULE DAILY FOR INDIGESTION AND HEARTBURN 12/08/22   Cranford, Tonya, NP  sildenafil (VIAGRA) 25 MG tablet Take 25 mg by mouth daily as needed for erectile dysfunction.    [provider]  solifenacin  (VESICARE ) 5 MG tablet Take 1 tablet (5 mg total) by mouth daily. Patient not taking: Reported on 07/06/2023 06/17/23   Allred, Darrell K, PA-C  tamsulosin   (FLOMAX ) 0.4 MG CAPS capsule Take 0.8 mg by mouth daily.    [provider]  tirzepatide  (MOUNJARO ) 10 MG/0.5ML Pen Inject 10 mg into the skin once a week. 03/06/23   Emilie Harden, MD     Family History  Problem Relation Age of Onset   Atrial fibrillation Mother    Kidney cancer Father 39       kidney   Stroke Maternal Grandmother    Dementia Paternal Grandmother        Early 51s   Cancer Paternal Grandfather        Unsure of age or type   Cancer Paternal Uncle 80    Social History   Socioeconomic History   Marital status: Widowed    Spouse name: Not on file   Number of children: 2   Years of education: Not on file   Highest education level: Not on file  Occupational History   Not on file  Tobacco Use   Smoking status: Never   Smokeless tobacco: Never  Vaping Use   Vaping status: Never Used  Substance and Sexual Activity   Alcohol use: Yes    Alcohol/week: 3.0 - 5.0 standard drinks of alcohol    Types: 3 - 5 Standard drinks or equivalent per week    Comment: weekly   Drug use: No   Sexual activity: Yes    Partners: Female  Other Topics Concern   Not on file  Social History Narrative   Not on file   Social Drivers of Health   Financial Resource Strain: Not on file  Food Insecurity: Not on file  Transportation Needs: Not on file  Physical Activity: Insufficiently Active (08/04/2017)   Exercise Vital Sign    Days of Exercise per Week: 1 day    Minutes of Exercise per Session: 50 min  Stress: No Stress Concern Present (09/21/2018)   [employer] of Occupational Health - Occupational Stress Questionnaire    Feeling of Stress : Only a little  Social Connections: Not on file     Vital Signs: There were no vitals taken for this visit.  Physical Exam  Imaging:    No results found.  Labs:  CBC: Recent Labs    10/27/22 1552 12/29/22 1548 06/17/23 1005  WBC 6.3 6.9 6.3  HGB 15.0 14.8 15.2  HCT 44.5 44.9 46.2  PLT 217 221 216     COAGS: Recent Labs    06/17/23 1005  INR 1.0    BMP: Recent Labs    10/27/22 1552 12/29/22 1548 05/07/23 1531 06/17/23 1005  NA 138 138 139 134*  K 3.3* 3.7 3.6 3.9  CL 101 99 98 99  CO2 28 26 27  27  GLUCOSE 110* 138* 230* 188*  BUN 13 18 14 15   CALCIUM  9.3 9.3 9.4 8.8*  CREATININE 1.05 1.25 1.04 1.05  GFRNONAA  --   --   --  >60    LIVER FUNCTION TESTS: Recent Labs    10/27/22 1552 12/29/22 1548 05/07/23 1531  BILITOT 1.0 1.2 1.0  AST 16 14 15   ALT 14 12 16   ALKPHOS  --   --  76  PROT 6.3 6.4 6.7  ALBUMIN  --   --  4.1    Assessment and Plan:  ***  Electronically Signed: Art Largo 07/24/2023, 4:46 PM   I spent a total of {Established Out-Pt:[id number]} in face to face in clinical consultation, greater than 50% of which was counseling/coordinating care for ***

## 2023-08-04 ENCOUNTER — Encounter: Payer: Self-pay | Admitting: Internal Medicine

## 2023-08-07 ENCOUNTER — Encounter: Payer: Self-pay | Admitting: Internal Medicine

## 2023-08-07 ENCOUNTER — Telehealth: Payer: Self-pay

## 2023-08-07 DIAGNOSIS — E139 Other specified diabetes mellitus without complications: Secondary | ICD-10-CM

## 2023-08-07 MED ORDER — DEXCOM G7 SENSOR MISC: 0 refills | Status: DC

## 2023-08-07 NOTE — Telephone Encounter (Signed)
 Pt called [place] the beach and his sensor will expire on tomorrow. I have sent in 1 dexcom sensor.  Requested Prescriptions   Signed Prescriptions Disp Refills   Continuous Glucose Sensor (DEXCOM G7 SENSOR) MISC 1 each 0    Sig: Use 1 sensor every 10 days    Authorizing Provider: Emilie Harden    Ordering User: Vernon Goodpasture

## 2023-08-17 ENCOUNTER — Encounter: Payer: Self-pay | Admitting: Internal Medicine

## 2023-08-18 DIAGNOSIS — G4733 Obstructive sleep apnea (adult) (pediatric): Secondary | ICD-10-CM | POA: Diagnosis not present

## 2023-09-01 DIAGNOSIS — D225 Melanocytic nevi of trunk: Secondary | ICD-10-CM | POA: Diagnosis not present

## 2023-09-01 DIAGNOSIS — L814 Other melanin hyperpigmentation: Secondary | ICD-10-CM | POA: Diagnosis not present

## 2023-09-01 DIAGNOSIS — L538 Other specified erythematous conditions: Secondary | ICD-10-CM | POA: Diagnosis not present

## 2023-09-01 DIAGNOSIS — Z08 Encounter for follow-up examination after completed treatment for malignant neoplasm: Secondary | ICD-10-CM | POA: Diagnosis not present

## 2023-09-01 DIAGNOSIS — D485 Neoplasm of uncertain behavior of skin: Secondary | ICD-10-CM | POA: Diagnosis not present

## 2023-09-01 DIAGNOSIS — L570 Actinic keratosis: Secondary | ICD-10-CM | POA: Diagnosis not present

## 2023-09-01 DIAGNOSIS — L820 Inflamed seborrheic keratosis: Secondary | ICD-10-CM | POA: Diagnosis not present

## 2023-09-01 DIAGNOSIS — Z789 Other specified health status: Secondary | ICD-10-CM | POA: Diagnosis not present

## 2023-09-01 DIAGNOSIS — L821 Other seborrheic keratosis: Secondary | ICD-10-CM | POA: Diagnosis not present

## 2023-09-07 ENCOUNTER — Ambulatory Visit: Admitting: Internal Medicine

## 2023-09-15 ENCOUNTER — Encounter: Payer: Self-pay | Admitting: Internal Medicine

## 2023-09-15 DIAGNOSIS — E139 Other specified diabetes mellitus without complications: Secondary | ICD-10-CM

## 2023-09-16 DIAGNOSIS — G4733 Obstructive sleep apnea (adult) (pediatric): Secondary | ICD-10-CM | POA: Diagnosis not present

## 2023-09-16 DIAGNOSIS — I10 Essential (primary) hypertension: Secondary | ICD-10-CM | POA: Diagnosis not present

## 2023-09-16 MED ORDER — DEXCOM G7 SENSOR MISC: 3 refills | Status: AC

## 2023-09-17 ENCOUNTER — Encounter: Payer: Self-pay | Admitting: Internal Medicine

## 2023-09-17 ENCOUNTER — Ambulatory Visit: Admitting: Internal Medicine

## 2023-09-17 VITALS — BP 130/80 | HR 77 | Temp 98.5°F | Ht 74.0 in | Wt 235.6 lb

## 2023-09-17 DIAGNOSIS — Z1211 Encounter for screening for malignant neoplasm of colon: Secondary | ICD-10-CM

## 2023-09-17 DIAGNOSIS — N182 Chronic kidney disease, stage 2 (mild): Secondary | ICD-10-CM | POA: Diagnosis not present

## 2023-09-17 DIAGNOSIS — Z23 Encounter for immunization: Secondary | ICD-10-CM

## 2023-09-17 DIAGNOSIS — E1122 Type 2 diabetes mellitus with diabetic chronic kidney disease: Secondary | ICD-10-CM | POA: Diagnosis not present

## 2023-09-17 DIAGNOSIS — E785 Hyperlipidemia, unspecified: Secondary | ICD-10-CM

## 2023-09-17 DIAGNOSIS — E559 Vitamin D deficiency, unspecified: Secondary | ICD-10-CM | POA: Diagnosis not present

## 2023-09-17 DIAGNOSIS — Z7984 Long term (current) use of oral hypoglycemic drugs: Secondary | ICD-10-CM

## 2023-09-17 DIAGNOSIS — E139 Other specified diabetes mellitus without complications: Secondary | ICD-10-CM

## 2023-09-17 DIAGNOSIS — R351 Nocturia: Secondary | ICD-10-CM

## 2023-09-17 DIAGNOSIS — I10 Essential (primary) hypertension: Secondary | ICD-10-CM

## 2023-09-17 DIAGNOSIS — N401 Enlarged prostate with lower urinary tract symptoms: Secondary | ICD-10-CM | POA: Diagnosis not present

## 2023-09-17 DIAGNOSIS — Z7985 Long-term (current) use of injectable non-insulin antidiabetic drugs: Secondary | ICD-10-CM

## 2023-09-17 DIAGNOSIS — Z794 Long term (current) use of insulin: Secondary | ICD-10-CM

## 2023-09-17 DIAGNOSIS — E1169 Type 2 diabetes mellitus with other specified complication: Secondary | ICD-10-CM

## 2023-09-17 LAB — COMPREHENSIVE METABOLIC PANEL WITH GFR
ALT: 15 U/L (ref 0–53)
AST: 18 U/L (ref 0–37)
Albumin: 3.9 g/dL (ref 3.5–5.2)
Alkaline Phosphatase: 71 U/L (ref 39–117)
BUN: 14 mg/dL (ref 6–23)
CO2: 29 meq/L (ref 19–32)
Calcium: 9.1 mg/dL (ref 8.4–10.5)
Chloride: 101 meq/L (ref 96–112)
Creatinine, Ser: 1.07 mg/dL (ref 0.40–1.50)
GFR: 70.81 mL/min (ref >60.00)
Glucose, Bld: 85 mg/dL (ref 70–99)
Potassium: 3.6 meq/L (ref 3.5–5.1)
Sodium: 138 meq/L (ref 135–145)
Total Bilirubin: 1.1 mg/dL (ref 0.2–1.2)
Total Protein: 6.6 g/dL (ref 6.0–8.3)

## 2023-09-17 LAB — VITAMIN D 25 HYDROXY (VIT D DEFICIENCY, FRACTURES): VITD: 91.88 ng/mL (ref 30.00–100.00)

## 2023-09-17 NOTE — Progress Notes (Signed)
 [facility] [address] [place] [address] Dept: [phone] Dept Fax: [phone]    Subjective:   Harry [name] April 20, 1955 09/17/2023  Chief Complaint  Patient presents with   Follow-up    HPI: Harry [name] presents today for re-assessment and management of chronic medical conditions.   Discussed the use of AI scribe software for clinical note transcription with the patient, who gave verbal consent to proceed.  History of Present Illness   Harry [name] is a 68 year old male with hypertension, HLD, diabetes, and vitamin D  deficiency who presents for a follow-up visit.  He takes olmesartan  and hydrochlorothiazide , splitting the dose into half twice a day. No chest pain or difficulty breathing.  He manages his diabetes with an insulin  pump and reports an A1c of 6.7 in May. He is adjusting to the pump and learning its insulin  dose calculations. He experienced a hypoglycemic episode after manually increasing his insulin  dose, but has not had any further problems. 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   He takes atorvastatin  for cholesterol management. Lipids well controlled at prior lab work.   He continues vitamin D  supplementation for his deficiency.   He underwent a procedure in May to block blood flow to his prostate to shrink it. He continues finasteride and tamsulosin  due to ongoing urinary symptoms and is considering follow-up with urology to assess the procedure's effectiveness.  He is due for a colonoscopy, with the last one performed in 2015.   He has not completed the shingles vaccine series previously.     Lab Results  Component Value Date   HGBA1C 6.7 (A) 07/06/2023   Last lipids Lab Results  Component Value Date   CHOL 149 05/07/2023   HDL 62.40 05/07/2023   LDLCALC 64 05/07/2023   TRIG 114.0 05/07/2023   CHOLHDL 2 05/07/2023      The following portions of the patient's history were  reviewed and updated as appropriate: past medical history, past surgical history, family history, social history, allergies, medications, and problem list.   Patient Active Problem List   Diagnosis Date Noted   Severe obstructive sleep apnea-hypopnea syndrome 12/29/2017   Family history of cerebrovascular disease 11/29/2017   LADA (latent autoimmune diabetes in adults), managed as type 2 (HCC) 11/12/2015   PVC's (premature ventricular contractions) 05/22/2014   Aneurysm, ascending aorta (HCC) 05/22/2014   BPH (benign prostatic hyperplasia) 04/06/2014   Erectile dysfunction associated with type 2 diabetes mellitus (HCC) 04/06/2014   CKD stage 2 due to type 2 diabetes mellitus (HCC) 02/17/2014   Obesity hypoventilation syndrome (HCC) 02/17/2014   Hyperlipidemia associated with type 2 diabetes mellitus (HCC)    Essential hypertension    Vitamin D  deficiency    GERD (gastroesophageal reflux disease)    Ventral hernia - periumbilical 2-3cm 02/04/2012   Morbid obesity (HCC) - BMI 30+ with OSA 02/04/2012   Past Medical History:  Diagnosis Date   Diabetes mellitus without complication (HCC) 1987   insulin  requiring   Elevated PSA 09/22/2018   [facility] is following    GERD (gastroesophageal reflux disease)    Hyperlipidemia    Hypertension    Hypokalemia    Hypomagnesemia    OSA on CPAP    10-12 years ago   Vitamin D  deficiency    Past Surgical History:  Procedure Laterality Date   ANAL FISSURE REPAIR  2007   IR 3D INDEPENDENT WKST  06/17/2023   IR ANGIOGRAM PELVIS SELECTIVE OR  SUPRASELECTIVE  06/17/2023   IR ANGIOGRAM SELECTIVE EACH ADDITIONAL VESSEL  06/17/2023   IR ANGIOGRAM SELECTIVE EACH ADDITIONAL VESSEL  06/17/2023   IR EMBO TUMOR ORGAN ISCHEMIA INFARCT INC GUIDE ROADMAPPING  06/17/2023   IR RADIOLOGIST EVAL & MGMT  05/06/2023   IR RADIOLOGIST EVAL & MGMT  07/24/2023   IR US  GUIDE VASC ACCESS LEFT  06/17/2023   IR US  GUIDE VASC ACCESS RIGHT  06/17/2023   TONSILLECTOMY  Bilateral    As a child   VENTRAL HERNIA REPAIR  04/13/12   Dr. Sheldon, ventral hernia repair w/ mesh   Family History  Problem Relation Age of Onset   Atrial fibrillation Mother    Kidney cancer Father 5       kidney   Stroke Maternal Grandmother    Dementia Paternal Grandmother        Early 64s   Cancer Paternal Grandfather        Unsure of age or type   Cancer Paternal Uncle 65    Current Outpatient Medications:    aspirin 81 MG tablet, Take 81 mg by mouth daily., Disp: , Rfl:    atorvastatin  (LIPITOR) 40 MG tablet, Take 1 tablet (40 mg total) by mouth daily. for cholesterol, Disp: 90 tablet, Rfl: 3   BD INSULIN  SYRINGE U/F 31G X 5/16 0.5 ML MISC, INJECT INSULIN  TWICE A DAY AS DIRECTED, Disp: 200 each, Rfl: 3   Cholecalciferol (VITAMIN D ) 125 MCG (5000 UT) CAPS, Take 10,000 Units by mouth daily., Disp: 60 capsule, Rfl: 0   Continuous Glucose Sensor (DEXCOM G7 SENSOR) MISC, Use 1 sensor every 10 days, Disp: 9 each, Rfl: 3   Cyanocobalamin  (B-12) 2500 MCG SUBL, Place 2,500 mcg under the tongue daily., Disp: , Rfl:    finasteride (PROSCAR) 5 MG tablet, Take 5 mg by mouth daily., Disp: , Rfl:    glucose 4 GM chewable tablet, Chew 1 tablet (4 g total) by mouth as needed for low blood sugar., Disp: 30 tablet, Rfl: 12   glucose blood (FREESTYLE TEST STRIPS) test strip, Use 1- 2 times a day with the freestyle libre 14.  CGM glucometer, Disp: 150 each, Rfl: 3   HUMALOG  100 UNIT/ML injection, Use up to 80 units a day in the insulin  pump, Disp: 60 mL, Rfl: 3   Magnesium 500 MG TABS, Take 500 mg by mouth daily., Disp: , Rfl:    metFORMIN  (GLUCOPHAGE -XR) 500 MG 24 hr tablet, TAKE 1 TABLET TWICE A DAY WITH MEALS FOR DIABETES, Disp: 180 tablet, Rfl: 3   olmesartan -hydrochlorothiazide  (BENICAR  HCT) 40-12.5 MG tablet, Take 0.5 tablets by mouth 2 (two) times daily., Disp: 90 tablet, Rfl: 3   omeprazole  (PRILOSEC) 20 MG capsule, TAKE 1 CAPSULE DAILY FOR INDIGESTION AND HEARTBURN, Disp: 90 capsule,  Rfl: 3   sildenafil (VIAGRA) 25 MG tablet, Take 25 mg by mouth daily as needed for erectile dysfunction., Disp: , Rfl:    tamsulosin  (FLOMAX ) 0.4 MG CAPS capsule, Take 0.8 mg by mouth daily., Disp: , Rfl:    tirzepatide  (MOUNJARO ) 10 MG/0.5ML Pen, Inject 10 mg into the skin once a week., Disp: 6 mL, Rfl: 3   ALPRAZolam  (XANAX ) 1 MG tablet, Take     1/2 to 1 tablet        2 to 3 x /day          as needed for Anxiety attack (Patient not taking: Reported on 09/17/2023), Disp: 30 tablet, Rfl: 0   injection device for insulin  (CEQUR SIMPLICITY  2U) DEVI, Use 1 reservoir every 4 days, Disp: 32 each, Rfl: 1   Injection Device for Insulin  (CEQUR SIMPLICITY INSERTER) MISC, Use as advised, Disp: 1 each, Rfl: 0   insulin  glargine, 2 Unit Dial, (TOUJEO  MAX SOLOSTAR) 300 UNIT/ML Solostar Pen, Inject 44 Units into the skin daily., Disp: 18 mL, Rfl: 3   solifenacin  (VESICARE ) 5 MG tablet, Take 1 tablet (5 mg total) by mouth daily. (Patient not taking: Reported on 09/17/2023), Disp: 7 tablet, Rfl: 0  Current Facility-Administered Medications:    ipratropium-albuterol  (DUONEB) 0.5-2.5 (3) MG/3ML nebulizer solution 3 mL, 3 mL, Nebulization, Once, Cranford, Tonya, NP Allergies  Allergen Reactions   Crestor [Rosuvastatin] Cough   Poison Ivy Treatment [Benzyl Alcohol-Camphor-Menthol]      ROS: A complete ROS was performed with pertinent positives/negatives noted in the HPI. The remainder of the ROS are negative.    Objective:   Today's Vitals   09/17/23 1039  BP: 130/80  Pulse: 77  Temp: 98.5 F (36.9 C)  TempSrc: Temporal  SpO2: 98%  Weight: 235 lb 9.6 oz (106.9 kg)  Height: 6' 2 (1.88 m)    GENERAL: Well-appearing, in NAD. Well nourished.  SKIN: Pink, warm and dry. No rash, lesion, ulceration, or ecchymoses.  NECK: Trachea midline. Full ROM w/o pain or tenderness. No lymphadenopathy.  RESPIRATORY: Chest wall symmetrical. Respirations even and non-labored. Breath sounds clear to auscultation  bilaterally.  CARDIAC: S1, S2 present, regular rate and rhythm. Peripheral pulses 2+ bilaterally. No carotid bruits.  EXTREMITIES: Without clubbing, cyanosis, or edema.  NEUROLOGIC:  Steady, even gait.  PSYCH/MENTAL STATUS: Alert, oriented x 3. Cooperative, appropriate mood and affect.   Health Maintenance Due  Topic Date Due   Colonoscopy  12/22/2023    No results found for any visits on 09/17/23.  The 10-year ASCVD risk score (Arnett DK, et al., 2019) is: 27.8%     Assessment & Plan:  Assessment and Plan    Type 2 Diabetes Mellitus Diabetes managed with insulin  pump and CGM. A1c improved to 6.7%. Insulin  pump adapts to meal patterns, reducing manual carb counting. - Continue follow-up with endocrinologist. - Monitor glucose with CGM and adjust insulin  pump as needed.  Hypertension Blood pressure controlled at 130/80 mmHg with olmesartan  and hydrochlorothiazide .  - Continue olmesartan  and hydrochlorothiazide . - Monitor blood pressure at home and report lightheadedness or hypotension.  Benign Prostatic Hyperplasia (BPH) Post-arterial embolization, urinary symptoms persist. Finasteride and tamsulosin  ongoing. Follow-up with urologist needed to assess treatment efficacy. - Contact urologist for follow-up and potential medication adjustment.  Hyperlipidemia Cholesterol levels normal. On atorvastatin . - Continue atorvastatin . - Recheck cholesterol levels in four months.  Vitamin D  Deficiency On vitamin D  supplementation. Levels to be checked. - Check vitamin D  levels with current blood work.  General Health Maintenance Colonoscopy due October 2025. Discussed family history of colon cancer. Pneumonia vaccine up to date. Shingles vaccine #1 administered today. - Order colonoscopy for October 2025. - Administer first Shingrix  dose today, plan second dose in four months. - Monitor Shingrix  side effects, manage with Advil and Tylenol.  Orders Placed This Encounter  Procedures    Varicella-zoster vaccine IM   VITAMIN D  25 Hydroxy (Vit-D Deficiency, Fractures)   Comprehensive metabolic panel with GFR   Ambulatory referral to Gastroenterology    Referral Priority:   Routine    Referral Type:   Consultation    Referral Reason:   Specialty Services Required    Number of Visits Requested:   1   No images  are attached to the encounter or orders placed in the encounter. No orders of the defined types were placed in this encounter.   Return in about 4 months (around 01/18/2024) for Chronic Conditions, 2nd shingrix  vaccine - fasting labs at appointment.   Rosina Senters, FNP

## 2023-09-22 ENCOUNTER — Ambulatory Visit: Payer: Self-pay | Admitting: Internal Medicine

## 2023-09-22 DIAGNOSIS — E559 Vitamin D deficiency, unspecified: Secondary | ICD-10-CM

## 2023-09-22 MED ORDER — VITAMIN D3 25 MCG (1000 UT) PO CAPS: 1000.0000 [IU] | ORAL_CAPSULE | Freq: Every day | ORAL | Status: AC

## 2023-10-01 ENCOUNTER — Encounter: Payer: Self-pay | Admitting: Internal Medicine

## 2023-10-01 MED ORDER — ATORVASTATIN CALCIUM 40 MG PO TABS
40.0000 mg | ORAL_TABLET | Freq: Every day | ORAL | 3 refills | Status: AC
Start: 2023-10-01 — End: ?

## 2023-10-05 ENCOUNTER — Encounter: Payer: Self-pay | Admitting: Internal Medicine

## 2023-10-05 DIAGNOSIS — E139 Other specified diabetes mellitus without complications: Secondary | ICD-10-CM

## 2023-10-15 ENCOUNTER — Other Ambulatory Visit: Payer: Self-pay | Admitting: Internal Medicine

## 2023-10-20 NOTE — Addendum Note (Signed)
 Addended by: DWYANE HIM D on: 10/20/2023 11:13 AM   Modules accepted: Orders

## 2023-10-22 DIAGNOSIS — E139 Other specified diabetes mellitus without complications: Secondary | ICD-10-CM | POA: Diagnosis not present

## 2023-10-26 DIAGNOSIS — M65341 Trigger finger, right ring finger: Secondary | ICD-10-CM | POA: Diagnosis not present

## 2023-10-27 ENCOUNTER — Encounter: Payer: BC Managed Care – PPO | Admitting: Nurse Practitioner

## 2023-11-06 ENCOUNTER — Encounter: Payer: Self-pay | Admitting: Internal Medicine

## 2023-11-06 ENCOUNTER — Ambulatory Visit: Admitting: Internal Medicine

## 2023-11-06 VITALS — BP 120/60 | HR 88 | Ht 74.0 in | Wt 229.6 lb

## 2023-11-06 DIAGNOSIS — E1122 Type 2 diabetes mellitus with diabetic chronic kidney disease: Secondary | ICD-10-CM | POA: Diagnosis not present

## 2023-11-06 DIAGNOSIS — N182 Chronic kidney disease, stage 2 (mild): Secondary | ICD-10-CM

## 2023-11-06 DIAGNOSIS — E663 Overweight: Secondary | ICD-10-CM

## 2023-11-06 DIAGNOSIS — E785 Hyperlipidemia, unspecified: Secondary | ICD-10-CM | POA: Diagnosis not present

## 2023-11-06 DIAGNOSIS — E1169 Type 2 diabetes mellitus with other specified complication: Secondary | ICD-10-CM

## 2023-11-06 DIAGNOSIS — Z794 Long term (current) use of insulin: Secondary | ICD-10-CM

## 2023-11-06 DIAGNOSIS — Z9641 Presence of insulin pump (external) (internal): Secondary | ICD-10-CM

## 2023-11-06 DIAGNOSIS — E139 Other specified diabetes mellitus without complications: Secondary | ICD-10-CM

## 2023-11-06 DIAGNOSIS — Z7984 Long term (current) use of oral hypoglycemic drugs: Secondary | ICD-10-CM

## 2023-11-06 LAB — POCT GLYCOSYLATED HEMOGLOBIN (HGB A1C): Hemoglobin A1C: 5.9 % — AB (ref 4.0–5.6)

## 2023-11-06 NOTE — Addendum Note (Signed)
 Addended by: CLEOTILDE ROLIN RAMAN on: 11/06/2023 04:32 PM   Modules accepted: Orders

## 2023-11-06 NOTE — Patient Instructions (Signed)
 Please continue: - Metformin  ER 500 mg in am and 500 mg at night - Mounjaro  10 mg weekly - iLet pump.  Please return in 4-6 months.

## 2023-11-06 NOTE — Progress Notes (Signed)
 Patient ID: Harry [name], male   DOB: [birth year], 68 y.o.   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 Last visit 4 months ago. 10/01/2021: changed Insurance from [employer] to [employer].  Interim history: No increased urination but does have nocturia, blurry vision, nausea, chest pain.  Since last visit, he started on the iLet insulin  pump. He had irritation at the infusion site with the rigid canula and now using the teflon canula.  Reviewed HbA1c levels: Lab Results  Component Value Date   HGBA1C 6.7 (A) 07/06/2023   HGBA1C 6.9 (A) 03/06/2023   HGBA1C 6.9 11/04/2022   At last visit he was on: - Metformin  ER 1000 >> 500 mg in am and 500 mg at night -decreased 2/2 diarrhea -now tolerated well - Mounjaro  5 >> 7.5 mg daily >> Ozempic  1 mg weekly (due to lack of availability of Mounjaro ) >> Mounjaro  7.5 >> 10 mg  weekly  - Toujeo  42-44 >> 44 units at bedtime - Lispro >> Novolog  - 15 min before meals: in the Cequr pump - he likes it  5 >> 6-8 units in am 12-15 >> 12-20 >> 10-14 units before lunch 12-18 >> 12-20 >> 10-14 units before dinner Correction at bedtime:4-5 units Sliding scale of Humalog  >> Lispro >> Novolog  >> lispro >> Humalog : - 141-160: + 2 units  - 161-180: + 3 units  - 181-200: + 4 units  - 201-240: + 5 units  - 241-280: + 6 units  - 281-320: + 7 units  - 321-360: + 8 units  - >361: + 10 units He had burning at the inj site with Lyumjev . He was previously on Trulicity . He was previously on Lantus  and Tresiba .  Also, Humalog  brand-name.  He is currently on: - Metformin  ER 500 mg twice a day - Mounjaro  10 mg weekly - iLet pump - started ~08/2023 - Humalog  U100 in the pump  He checks his sugars more than 4 times a day with his CGM:    Previously:    Previously:  Lowest sugar was 30s >>  ...  50 >> 60s; he has hypoglycemia awareness in the 60s. Highest sugar was in the HI >> .SABRASABRA 400s >> 375 >> 300.  Glucometer: one touch ultra 2  Pt's meals are: - Breakfast: Glucerna, banana >> oatmeal >> English muffin or boiled egg or oatmeal - Lunch: eats out - salad with chicken; chicken burrito; sandwich; chick-fil-a >> sandwich with either tomatoes or peanut butter and jelly + hold milk - Dinner: meat + veggie + starch - Snacks: potato chips, fruit, icecream No sodas.  -+ Mild CKD, last BUN/creatinine:  Lab Results  Component Value Date   BUN 14 09/17/2023   BUN 15 06/17/2023   CREATININE 1.07 09/17/2023   CREATININE 1.05 06/17/2023   Lab Results  Component Value Date   MICRALBCREAT NOTE 07/06/2023   MICRALBCREAT 2 10/27/2022   MICRALBCREAT 2 10/05/2020   MICRALBCREAT 2 09/29/2019   MICRALBCREAT 2 09/21/2018   MICRALBCREAT 7 08/04/2017   MICRALBCREAT 3 06/02/2016   MICRALBCREAT 3 04/18/2015   MICRALBCREAT 2.7 04/06/2014   MICRALBCREAT 3.3 04/06/2013  On losartan .  -+ HL; last set of lipids: Lab Results  Component Value Date   CHOL 149 05/07/2023   HDL 62.40 05/07/2023   LDLCALC 64 05/07/2023   TRIG  114.0 05/07/2023   CHOLHDL 2 05/07/2023  On Lipitor 40.  - last eye exam was 03/18/2022: No DR.  -No numbness and tingling in his feet.  Foot exam 11/04/2022.  He also has a history of HTN, GERD, OSA-on CPAP.  His wife passed away with cancer in 2018/05/09.  ROS: + See HPI  I reviewed pt's medications, allergies, PMH, social hx, family hx, and changes were documented in the history of present illness. Otherwise, unchanged from my initial visit note.  Past Medical History:  Diagnosis Date   Diabetes mellitus without complication (HCC) 1987   insulin  requiring   Elevated PSA 09/22/2018   [facility] is following    GERD (gastroesophageal reflux disease)    Hyperlipidemia    Hypertension    Hypokalemia    Hypomagnesemia    OSA on CPAP    10-12 years  ago   Vitamin D  deficiency    Past Surgical History:  Procedure Laterality Date   ANAL FISSURE REPAIR  2007   IR 3D INDEPENDENT WKST  06/17/2023   IR ANGIOGRAM PELVIS SELECTIVE OR SUPRASELECTIVE  06/17/2023   IR ANGIOGRAM SELECTIVE EACH ADDITIONAL VESSEL  06/17/2023   IR ANGIOGRAM SELECTIVE EACH ADDITIONAL VESSEL  06/17/2023   IR EMBO TUMOR ORGAN ISCHEMIA INFARCT INC GUIDE ROADMAPPING  06/17/2023   IR RADIOLOGIST EVAL & MGMT  05/06/2023   IR RADIOLOGIST EVAL & MGMT  07/24/2023   IR US  GUIDE VASC ACCESS LEFT  06/17/2023   IR US  GUIDE VASC ACCESS RIGHT  06/17/2023   TONSILLECTOMY Bilateral    As a child   VENTRAL HERNIA REPAIR  04/13/12   Dr. Sheldon, ventral hernia repair w/ mesh   Social History   Social History   Marital status: Married    Spouse name: N/A   Number of children: 2   Occupational History   estimator   Social History Main Topics   Smoking status: Never Smoker   Smokeless tobacco: Not on file   Alcohol use 0.0 oz/week    2 - 5 Cans of beer per week   Current Outpatient Medications on File Prior to Visit  Medication Sig Dispense Refill   aspirin 81 MG tablet Take 81 mg by mouth daily.     atorvastatin  (LIPITOR) 40 MG tablet Take 1 tablet (40 mg total) by mouth daily. for cholesterol 90 tablet 3   BD INSULIN  SYRINGE U/F 31G X 5/16 0.5 ML MISC INJECT INSULIN  TWICE A DAY AS DIRECTED 200 each 3   Cholecalciferol (VITAMIN D3) 25 MCG (1000 UT) CAPS Take 1 capsule (1,000 Units total) by mouth daily.     Continuous Glucose Sensor (DEXCOM G7 SENSOR) MISC Use 1 sensor every 10 days 9 each 3   Cyanocobalamin  (B-12) 2500 MCG SUBL Place 2,500 mcg under the tongue daily.     finasteride (PROSCAR) 5 MG tablet Take 5 mg by mouth daily.     glucose 4 GM chewable tablet Chew 1 tablet (4 g total) by mouth as needed for low blood sugar. 30 tablet 12   glucose blood (FREESTYLE TEST STRIPS) test strip Use 1- 2 times a day with the freestyle libre 14.  CGM glucometer 150 each 3   HUMALOG  100  UNIT/ML injection Use up to 80 units a day in the insulin  pump 60 mL 3   injection device for insulin  (CEQUR SIMPLICITY 2U) DEVI Use 1 reservoir every 4 days 32 each 1   Injection Device for Insulin  (CEQUR SIMPLICITY INSERTER) MISC Use as  advised 1 each 0   insulin  glargine, 2 Unit Dial, (TOUJEO  MAX SOLOSTAR) 300 UNIT/ML Solostar Pen Inject 44 Units into the skin daily. 18 mL 3   Magnesium 500 MG TABS Take 500 mg by mouth daily.     metFORMIN  (GLUCOPHAGE -XR) 500 MG 24 hr tablet Take 1 tablet by mouth twice daily with meals for diabetes. 180 tablet 2   olmesartan -hydrochlorothiazide  (BENICAR  HCT) 40-12.5 MG tablet Take 0.5 tablets by mouth 2 (two) times daily. 90 tablet 3   omeprazole  (PRILOSEC) 20 MG capsule TAKE 1 CAPSULE DAILY FOR INDIGESTION AND HEARTBURN 90 capsule 3   sildenafil (VIAGRA) 25 MG tablet Take 25 mg by mouth daily as needed for erectile dysfunction.     tamsulosin  (FLOMAX ) 0.4 MG CAPS capsule Take 0.8 mg by mouth daily.     tirzepatide  (MOUNJARO ) 10 MG/0.5ML Pen Inject 10 mg into the skin once a week. 6 mL 3   Current Facility-Administered Medications on File Prior to Visit  Medication Dose Route Frequency Provider Last Rate Last Admin   ipratropium-albuterol  (DUONEB) 0.5-2.5 (3) MG/3ML nebulizer solution 3 mL  3 mL Nebulization Once Cranford, Tonya, NP       Allergies  Allergen Reactions   Crestor [Rosuvastatin] Cough   Poison Ivy Treatment [Benzyl Alcohol-Camphor-Menthol]    Family History  Problem Relation Age of Onset   Atrial fibrillation Mother    Kidney cancer Father 36       kidney   Stroke Maternal Grandmother    Dementia Paternal Grandmother        Early 81s   Cancer Paternal Apolinar Jarvis of age or type   Cancer Paternal Uncle 44   PE: BP 120/60   Pulse 88   Ht 6' 2 (1.88 m)   Wt 229 lb 9.6 oz (104.1 kg)   SpO2 95%   BMI 29.48 kg/m   Wt Readings from Last 10 Encounters:  11/06/23 229 lb 9.6 oz (104.1 kg)  09/17/23 235 lb 9.6 oz  (106.9 kg)  07/06/23 240 lb 6.4 oz (109 kg)  06/17/23 235 lb (106.6 kg)  05/07/23 240 lb 6.4 oz (109 kg)  03/06/23 249 lb (112.9 kg)  02/03/23 241 lb (109.3 kg)  01/08/23 238 lb (108 kg)  12/29/22 235 lb (106.6 kg)  12/16/22 239 lb 9.6 oz (108.7 kg)   Constitutional: overweight, in NAD Eyes: no exophthalmos ENT: no masses palpated in neck, no cervical lymphadenopathy Cardiovascular: RRR, No MRG Respiratory: CTA B Musculoskeletal: no deformities Skin: no rashes Neurological: no tremor with outstretched hands  ASSESSMENT: 1. LADA, insulin -dependent, uncontrolled, with complications - CKD stage 2 - ED  - he has an elliptical machine at home  Component     Latest Ref Rng & Units 07/11/2016  Hemoglobin A1C      6.5  Glutamic Acid Decarb Ab     <5 IU/mL 47 (H)  Pancreatic Islet Cell Antibody     <5 JDF Units <5  C-Peptide     0.80 - 3.85 ng/mL 0.37 (L)  Glucose, Fasting     65 - 99 mg/dL 895 (H)   Elevated GAD antibodies and decreased insulin  production. 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 HL  3.  Overweight  PLAN:  1. Patient with history of uncontrolled, insulin -dependent diabetes (LADA), on metformin , GLP-1/GIP receptor agonist and previously on basal/bolus insulin  with the  help of the CeQur insulin  pump, but currently on the islet insulin  pump, started since last visit.  His sugars improved significantly after starting this.  We did not have to switch him to U200 insulin  in the pump, so he is currently using Humalog  U100. CGM interpretation: -At today's visit, we reviewed his CGM downloads: It appears that 76% of values are in target range (goal >70%), while 24% are higher than 180 (goal <25%), and 0% are lower than 70 (goal <4%).  The calculated average blood sugar is 150.  The projected HbA1c for the next 3 months (GMI) is 6.9%. -Reviewing the CGM trends, sugars are mostly  fluctuating within the target range, with only occasional hyperglycemic exceptions.  Some of these are related to leaking sites, for example last night, after switching from rigid to Teflon infusion sets.  We discussed that please have more collapsible and you should make sure that he is not having any scar tissue at the infusion site.  Also, he sometimes misses announcing the meals and we discussed about paying attention to this.  Otherwise, I advised him to continue using the pump along with oral and metformin .  Will leave the Toujeo  on his medication list to have as a backup in case his pump fails.  He was recently out of town and his sensor failed so he used insulin  from the pump, but we discussed that he will need long-acting insulin  in this situation, also. - I advised him to:  Patient Instructions  Please continue: - Metformin  ER 500 mg in am and 500 mg at night - Mounjaro  10 mg weekly - iLet pump.  Please return in 4-6 months.     - we checked his HbA1c: 5.9% (lower) - advised to check sugars at different times of the day - 4x a day, rotating check times - advised for yearly eye exams >> he is UTD - return to clinic in 4-6 months   2. HL - Latest lipid panel was at goal 6 months ago: Lab Results  Component Value Date   CHOL 149 05/07/2023   HDL 62.40 05/07/2023   LDLCALC 64 05/07/2023   TRIG 114.0 05/07/2023   CHOLHDL 2 05/07/2023  -He continues on Lipitor 40 mg daily without side effects  3.  Overweight -We were able to switch from Trulicity  to Mounjaro  for stronger effect on weight and sugars.  However, he was not able to fill Mounjaro  and he was still on Ozempic .  At last visit he was on Mounjaro  and we increased the dose. - Lost 9 pounds before last visit, previously gained 6 - He lost 11 pounds since last visit and is now out of the obesity category  Lela Fendt, MD PhD [facility]

## 2023-11-11 ENCOUNTER — Other Ambulatory Visit (HOSPITAL_BASED_OUTPATIENT_CLINIC_OR_DEPARTMENT_OTHER): Payer: Self-pay | Admitting: Family

## 2023-11-11 DIAGNOSIS — I10 Essential (primary) hypertension: Secondary | ICD-10-CM

## 2023-11-12 ENCOUNTER — Encounter: Payer: Self-pay | Admitting: Internal Medicine

## 2023-11-12 ENCOUNTER — Other Ambulatory Visit: Payer: Self-pay | Admitting: Internal Medicine

## 2023-11-12 DIAGNOSIS — E139 Other specified diabetes mellitus without complications: Secondary | ICD-10-CM

## 2023-11-12 NOTE — Telephone Encounter (Signed)
 Noted

## 2023-11-23 ENCOUNTER — Telehealth: Payer: Self-pay

## 2023-11-23 ENCOUNTER — Other Ambulatory Visit (HOSPITAL_COMMUNITY): Payer: Self-pay

## 2023-11-23 NOTE — Telephone Encounter (Signed)
 Pharmacy Patient Advocate Encounter   Received notification from CoverMyMeds that prior authorization for Mounjaro  7.5MG /0.5ML auto-injectors is required/requested.   Insurance verification completed.   The patient is insured through [facility] .   Medication was changed to Mounjaro  10MG /0.5ML auto-injectors and picked up on 11/11/23. PA not needed at this time.

## 2023-11-26 ENCOUNTER — Telehealth: Payer: Self-pay

## 2023-11-26 NOTE — Telephone Encounter (Signed)
 Pt needing referral to [facility] [phone]

## 2023-11-26 NOTE — Telephone Encounter (Signed)
 Pt request to update referral has been sent to referral coordinator.

## 2023-12-04 ENCOUNTER — Other Ambulatory Visit: Payer: Self-pay | Admitting: Internal Medicine

## 2023-12-04 ENCOUNTER — Telehealth: Payer: Self-pay | Admitting: Internal Medicine

## 2023-12-04 MED ORDER — POTASSIUM CHLORIDE CRYS ER 20 MEQ PO TBCR: 20.0000 meq | EXTENDED_RELEASE_TABLET | Freq: Two times a day (BID) | ORAL | 3 refills | Status: AC

## 2023-12-04 NOTE — Telephone Encounter (Signed)
Not on current med list, unable to pend.

## 2023-12-04 NOTE — Telephone Encounter (Signed)
 Pt requesting medication refill from historical provider   [facility] 12/08/2022 LOV  09/17/23 FOV 02/01/24

## 2023-12-04 NOTE — Telephone Encounter (Signed)
 Rx verified and sent in for patient

## 2023-12-04 NOTE — Telephone Encounter (Unsigned)
 Copied from CRM [id number]. Topic: Clinical - Medication Refill >> Dec 04, 2023  9:02 AM Suzen RAMAN wrote: Medication:  Potassium Chloride (Dosage unknown) 2 tablets twice daily  Has the patient contacted their pharmacy? Yes   This is the patient's preferred pharmacy:  [email or web address] - [facility] - [place], [place] - [address] [place] [address] Phone: [phone] Fax: [phone]  Is this the correct pharmacy for this prescription? Yes If no, delete pharmacy and type the correct one.   Has the prescription been filled recently? No  Is the patient out of the medication? No  Has the patient been seen for an appointment in the last year OR does the patient have an upcoming appointment? Yes  Can we respond through MyChart? Yes  Agent: Please be advised that Rx refills may take up to 3 business days. We ask that you follow-up with your pharmacy.

## 2023-12-15 DIAGNOSIS — I10 Essential (primary) hypertension: Secondary | ICD-10-CM | POA: Diagnosis not present

## 2023-12-31 DIAGNOSIS — J069 Acute upper respiratory infection, unspecified: Secondary | ICD-10-CM | POA: Diagnosis not present

## 2023-12-31 DIAGNOSIS — J029 Acute pharyngitis, unspecified: Secondary | ICD-10-CM | POA: Diagnosis not present

## 2023-12-31 DIAGNOSIS — Z03818 Encounter for observation for suspected exposure to other biological agents ruled out: Secondary | ICD-10-CM | POA: Diagnosis not present

## 2024-01-13 NOTE — Progress Notes (Signed)
 Harry [name]

## 2024-01-13 NOTE — Progress Notes (Signed)
 Severe obstructive sleep apnea-hypopnea syndrome 12/29/2017 at [facility]-  Confirmed 2024 by HST  Dr Tonita, MD      Family history of cerebrovascular disease 11/29/2017   LADA (latent autoimmune diabetes in adults), managed as type 2 (HCC) 11/12/2015   PVC's (premature ventricular contractions) 05/22/2014   Aneurysm, ascending aorta (HCC) 05/22/2014   BPH (benign prostatic hyperplasia) 04/06/2014   Erectile dysfunction associated with type 2 diabetes mellitus (HCC) 04/06/2014   CKD stage 2 due to type 2 diabetes mellitus (HCC) 02/17/2014   Obesity hypoventilation syndrome (HCC) 02/17/2014   Hyperlipidemia associated with type 2 diabetes mellitus (HCC)     Essential hypertension     Vitamin D  deficiency     GERD (gastroesophageal reflux disease)     Ventral hernia - periumbilical 2-3cm 02/04/2012   Morbid obesity (HCC) - BMI 30+ with OSA 02/04/2012

## 2024-01-14 ENCOUNTER — Encounter: Payer: Self-pay | Admitting: Neurology

## 2024-01-14 ENCOUNTER — Ambulatory Visit: Payer: BC Managed Care – PPO | Admitting: Neurology

## 2024-01-14 VITALS — BP 125/73 | HR 82 | Ht 73.0 in | Wt 220.0 lb

## 2024-01-14 DIAGNOSIS — G4733 Obstructive sleep apnea (adult) (pediatric): Secondary | ICD-10-CM

## 2024-01-14 DIAGNOSIS — E139 Other specified diabetes mellitus without complications: Secondary | ICD-10-CM

## 2024-01-14 DIAGNOSIS — E66811 Obesity, class 1: Secondary | ICD-10-CM | POA: Diagnosis not present

## 2024-01-14 NOTE — Progress Notes (Signed)
 Provider:  Dedra Gores, MD  Primary Care Physician:  Billy Knee, FNP [address] [place] [address]     Referring Provider: Laurice President, [address] [place],  [place] [address]          Chief Complaint according to patient   Patient presents with:                HISTORY OF PRESENT ILLNESS:  Harry [name] is a 68 y.o. male patient who is here for revisit 01/14/2024 for OSA  on CPAP.  Machine was replaced in 2024. SABRA Here for compliance.  A has used CPAP 100% , residual AHI was 2/h and no significant airleaks, 95% around 10 cm water,  machine is set 6-16 cm water   The patient lost 30 pounds since last sleep study. I am on Insulin  Pump  now and using less Insulin  than last year.     Chief concern according to patient :  no concerns.  Just supplies needed.    Fam Hx : see previous note  Social HX;  cut down on alcohol and is now 1-2 drinks a week from 8/ week.    Harry [name] is a 68 y.o. male patient who is here for revisit 01/08/2023 for  CPAP compliance.    Chief concern according to patient : Mr. Abdallah received a new CPAP but it is the same model he has used for over 5 years prior.  So he has no trouble with the CPAP machine knows how to use it.  He is an experienced user and he had seen me in February of this year as his old machine needed supplies and needed replacement.  At the time while on CPAP his Epworth score was 4 out of 24 points his fatigue severity score was 41 out of 63 points and he had endorsed a GDS at 3 out of 15.  He had a home sleep test with a little under 6 hours of recorded sleep and an overall AHI of 44/h.  So this qualifies for severe sleep apnea.  What was concerning is that most apneas were in non-REM sleep significantly more than in REM sleep.  The REM sleep AHI was under 10 an hour and non-REM sleep AHI was 50/h.  Following CMS criteria it would have been a REM AHI of 2.4 and a CMS based AHI of  36.8 in non-REM sleep.   He also had many more apneas when he slept on his back.     He started with his new CPAP - S 10 auto titration device between a setting of 6 and 16 cm water and 3 cm expiratory pressure relief.   His residual AHI is 1.1/h which is excellent he does not have central apneas arising, the 95th percentile pressure is only 8.7 cm water and he has very little air leakage.  He is 100% compliant patient by hours and days with an average of 9 hours 20 minutes each night.        His last visit in our clinic was on 2-18 2020 with Aleck Lunger , NP, who stated that he had excellent compliance data . His compliance was 100% for days and time his residual AHI was 1.7/h and at that time his Epworth sleepiness score had been reduced to 3 out of 24 points.   Compliance report now shows again 100% compliance for 30 out of  30 days over 4 hours each night with an average use time of 9 hours 27 minutes.   Review of Systems: Out of a complete 14 system review, the patient complains of only the following symptoms, and all other reviewed systems are negative.:   SLEEPINESS ?  How likely are you to doze in the following situations: 0 = not likely, 1 = slight chance, 2 = moderate chance, 3 = high chance  Sitting and Reading? Watching Television? Sitting inactive in a public place (theater or meeting)? Lying down in the afternoon when circumstances permit? Sitting and talking to someone? Sitting quietly after lunch without alcohol? In a car, while stopped for a few minutes in traffic? As a passenger in a car for an hour without a break?  Total =  2/ 24  FSS at  34 / 63   No depression.   Rhinitis.           Social History   Socioeconomic History   Marital status: Widowed    Spouse name: Not on file   Number of children: 2   Years of education: Not on file   Highest education level: Bachelor's degree (e.g., BA, AB, BS)  Occupational History   Not on file  Tobacco Use    Smoking status: Never   Smokeless tobacco: Never  Vaping Use   Vaping status: Never Used  Substance and Sexual Activity   Alcohol use: Yes    Alcohol/week: 5.0 standard drinks of alcohol    Types: 1 Glasses of wine, 1 Cans of beer, 3 Shots of liquor per week    Comment: weekly   Drug use: No   Sexual activity: Yes    Partners: Female  Other Topics Concern   Not on file  Social History Narrative   Pt lives alone    Pt works    Social Drivers of Corporate Investment Banker Strain: Low Risk  (09/15/2023)   Overall Financial Resource Strain (CARDIA)    Difficulty of Paying Living Expenses: Not hard at all  Food Insecurity: No Food Insecurity (09/15/2023)   Hunger Vital Sign    Worried About Running Out of Food in the Last Year: Never true    Ran Out of Food in the Last Year: Never true  Transportation Needs: No Transportation Needs (09/15/2023)   PRAPARE - Administrator, Civil Service (Medical): No    Lack of Transportation (Non-Medical): No  Physical Activity: Inactive (09/15/2023)   Exercise Vital Sign    Days of Exercise per Week: 0 days    Minutes of Exercise per Session: Not on file  Stress: No Stress Concern Present (09/15/2023)   [employer] of Occupational Health - Occupational Stress Questionnaire    Feeling of Stress: Not at all  Social Connections: Socially Isolated (09/15/2023)   Social Connection and Isolation Panel    Frequency of Communication with Friends and Family: More than three times a week    Frequency of Social Gatherings with Friends and Family: Twice a week    Attends Religious Services: Never    Database Administrator or Organizations: No    Attends Engineer, Structural: Not on file    Marital Status: Widowed    Family History  Problem Relation Age of Onset   Atrial fibrillation Mother    Kidney cancer Father 23       kidney   Cancer Paternal Uncle 95   Stroke Maternal Grandmother    Dementia  Paternal Grandmother         Early 51s   Cancer Paternal Grandfather        Kurt of age or type   Sleep apnea Neg Hx     Past Medical History:  Diagnosis Date   Diabetes mellitus without complication (HCC) 1987   insulin  requiring   Elevated PSA 09/22/2018   [facility] is following    GERD (gastroesophageal reflux disease)    Hyperlipidemia    Hypertension    Hypokalemia    Hypomagnesemia    OSA on CPAP    10-12 years ago   Vitamin D  deficiency     Past Surgical History:  Procedure Laterality Date   ANAL FISSURE REPAIR  2007   IR 3D INDEPENDENT WKST  06/17/2023   IR ANGIOGRAM PELVIS SELECTIVE OR SUPRASELECTIVE  06/17/2023   IR ANGIOGRAM SELECTIVE EACH ADDITIONAL VESSEL  06/17/2023   IR ANGIOGRAM SELECTIVE EACH ADDITIONAL VESSEL  06/17/2023   IR EMBO TUMOR ORGAN ISCHEMIA INFARCT INC GUIDE ROADMAPPING  06/17/2023   IR RADIOLOGIST EVAL & MGMT  05/06/2023   IR RADIOLOGIST EVAL & MGMT  07/24/2023   IR US  GUIDE VASC ACCESS LEFT  06/17/2023   IR US  GUIDE VASC ACCESS RIGHT  06/17/2023   TONSILLECTOMY Bilateral    As a child   VENTRAL HERNIA REPAIR  04/13/12   Dr. Sheldon, ventral hernia repair w/ mesh     Current Outpatient Medications on File Prior to Visit  Medication Sig Dispense Refill   aspirin 81 MG tablet Take 81 mg by mouth daily.     atorvastatin  (LIPITOR) 40 MG tablet Take 1 tablet (40 mg total) by mouth daily. for cholesterol 90 tablet 3   BD INSULIN  SYRINGE U/F 31G X 5/16 0.5 ML MISC INJECT INSULIN  TWICE A DAY AS DIRECTED 200 each 3   Cholecalciferol (VITAMIN D3) 25 MCG (1000 UT) CAPS Take 1 capsule (1,000 Units total) by mouth daily.     Continuous Glucose Sensor (DEXCOM G7 SENSOR) MISC Use 1 sensor every 10 days 9 each 3   Cyanocobalamin  (B-12) 2500 MCG SUBL Place 2,500 mcg under the tongue daily.     glucose 4 GM chewable tablet Chew 1 tablet (4 g total) by mouth as needed for low blood sugar. 30 tablet 12   glucose blood (FREESTYLE TEST STRIPS) test strip Use 1- 2 times a day with the  freestyle libre 14.  CGM glucometer 150 each 3   HUMALOG  100 UNIT/ML injection Use up to 80 units a day in the insulin  pump 60 mL 3   Magnesium 500 MG TABS Take 500 mg by mouth daily.     metFORMIN  (GLUCOPHAGE -XR) 500 MG 24 hr tablet Take 1 tablet by mouth twice daily with meals for diabetes. 180 tablet 2   MOUNJARO  10 MG/0.5ML Pen Inject 10 mg under the skin once weekly. 6 mL 2   olmesartan -hydrochlorothiazide  (BENICAR  HCT) 40-12.5 MG tablet Take 1/2 tablet by mouth twice daily. Discontinue 40-25 mg tablet. 90 tablet 2   omeprazole  (PRILOSEC) 20 MG capsule TAKE 1 CAPSULE DAILY FOR INDIGESTION AND HEARTBURN 90 capsule 3   potassium chloride  SA (KLOR-CON  M) 20 MEQ tablet Take 1 tablet (20 mEq total) by mouth 2 (two) times daily. 180 tablet 3   sildenafil (VIAGRA) 25 MG tablet Take 25 mg by mouth daily as needed for erectile dysfunction.     tamsulosin  (FLOMAX ) 0.4 MG CAPS capsule Take 0.8 mg by mouth daily.     finasteride (PROSCAR)  5 MG tablet Take 5 mg by mouth daily.     insulin  glargine, 2 Unit Dial, (TOUJEO  MAX SOLOSTAR) 300 UNIT/ML Solostar Pen Inject 44 Units into the skin daily. 18 mL 3   Current Facility-Administered Medications on File Prior to Visit  Medication Dose Route Frequency Provider Last Rate Last Admin   ipratropium-albuterol  (DUONEB) 0.5-2.5 (3) MG/3ML nebulizer solution 3 mL  3 mL Nebulization Once Cranford, Tonya, NP        Allergies  Allergen Reactions   Crestor [Rosuvastatin] Cough   Poison Ivy Treatment [Benzyl Alcohol-Camphor-Menthol]      DIAGNOSTIC DATA (LABS, IMAGING, TESTING) - I reviewed patient records, labs, notes, testing and imaging myself where available.  Lab Results  Component Value Date   WBC 6.3 06/17/2023   HGB 15.2 06/17/2023   HCT 46.2 06/17/2023   MCV 85.4 06/17/2023   PLT 216 06/17/2023      Component Value Date/Time   NA 138 09/17/2023 1118   K 3.6 09/17/2023 1118   CL 101 09/17/2023 1118   CO2 29 09/17/2023 1118   GLUCOSE 85  09/17/2023 1118   BUN 14 09/17/2023 1118   CREATININE 1.07 09/17/2023 1118   CREATININE 1.25 12/29/2022 1548   CALCIUM  9.1 09/17/2023 1118   PROT 6.6 09/17/2023 1118   ALBUMIN 3.9 09/17/2023 1118   AST 18 09/17/2023 1118   ALT 15 09/17/2023 1118   ALKPHOS 71 09/17/2023 1118   BILITOT 1.1 09/17/2023 1118   GFRNONAA >60 06/17/2023 1005   GFRNONAA 83 04/23/2020 1212   GFRAA 96 04/23/2020 1212   Lab Results  Component Value Date   CHOL 149 05/07/2023   HDL 62.40 05/07/2023   LDLCALC 64 05/07/2023   TRIG 114.0 05/07/2023   CHOLHDL 2 05/07/2023   Lab Results  Component Value Date   HGBA1C 5.9 (A) 11/06/2023   Lab Results  Component Value Date   VITAMINB12 1,843 (H) 10/27/2022   Lab Results  Component Value Date   TSH 1.95 05/07/2023    PHYSICAL EXAM:  Vitals:   01/14/24 1433  BP: 125/73  Pulse: 82   No data found. Body mass index is 29.03 kg/m.   Wt Readings from Last 3 Encounters:  01/14/24 220 lb (99.8 kg)  11/06/23 229 lb 9.6 oz (104.1 kg)  09/17/23 235 lb 9.6 oz (106.9 kg)     Ht Readings from Last 3 Encounters:  01/14/24 6' 1 (1.854 m)  11/06/23 6' 2 (1.88 m)  09/17/23 6' 2 (1.88 m)      General: The patient is awake, alert and appears not in acute distress and groomed. Head: Normocephalic, atraumatic.  Neck is supple. Mallampati 2, unshaven. neck circumference: 17. 25  down from 18 inches . Nasal airflow  patent.   Retrognathia is  seen.  Dental status: biological  Cardiovascular:  Regular rate and cardiac rhythm by pulse,  without distended neck veins. Respiratory: Lungs are clear to auscultation.  Skin: facial erythema,  Without evidence of ankle edema, or rash. Trunk: The patient's posture is erect. BMI now 29.   Neurologic exam : The patient is awake and alert, oriented to place and time.   Memory subjective described as intact.  Attention span & concentration ability appears normal.  Speech is fluent,  without  dysarthria, dysphonia  or aphasia.  Mood and affect are appropriate.   Cranial nerves: no loss of smell or taste reported  Pupils are equal and briskly reactive to light. Extraocular movements in vertical and horizontal planes  were intact and without nystagmus. No Diplopia. Visual fields by finger perimetry are intact. Hearing was intact to soft voice and finger rubbing.    Facial sensation intact to fine touch.  Facial motor strength is symmetric and tongue and uvula move midline.  Neck ROM : rotation, tilt and flexion extension were normal for age and shoulder shrug was symmetrical.    Motor exam:  Symmetric bulk, tone and ROM.   Normal tone without cog -wheeling, symmetric grip strength .   Sensory:  Fine touch, pinprick and vibration were tested  and  normal.  Proprioception tested in the upper extremities was normal. Deep tendon reflexes: in the  upper and lower extremities are symmetric and intact.  Babinski response was deferred .    ASSESSMENT AND PLAN :   68 y.o. year old male  here with:    1) 100% CPAP compliance and good control of Sleep apnea, residual AHI is low.   2) rhinitis , chronic rhinitis. Nasal speech   3) RV in 12 months with NP     I would like to thank Billy Knee, FNP and Laurice President, [address] [place],  [place] [address] for allowing me to meet with this pleasant patient.   Sleep Clinic Patients are generally offered input on sleep hygiene, life style changes and how to improve compliance with medical treatment where applicable. Review and reiteration of good sleep hygiene measures is offered to any sleep clinic patient, be it in the first consultation or with any follow up visits.    Any patient with sleepiness should be cautioned not to drive, work at heights, or operate dangerous or heavy equipment when feeling tired or sleepy.      The patient will be seen in follow-up in the sleep clinic at [facility] for discussion of test results, sleep related  symptoms and treatment compliance review, further management strategies, etc.   The referring provider will be notified of the test results.   The patient's condition requires frequent monitoring and adjustments in the treatment plan, reflecting the ongoing complexity of care.  This provider is the continuing focal point for all needed services for this condition.  After spending a total time of  25  minutes face to face and time for  history taking, physical and neurologic examination, review of laboratory studies,  personal review of imaging studies, reports and results of other testing and review of referral information / records as far as provided in visit,   Electronically signed by: Dedra Gores, MD 01/14/2024 2:58 PM  [facility] Board certified by The [employer] of Sleep Medicine and Diplomate of the [employer] of Sleep Medicine. Board certified In Neurology through the ABPN, Fellow of the [employer] of Neurology.

## 2024-01-14 NOTE — Patient Instructions (Signed)
 I reviewed the patient's CPAP compliance data , 1005 compliance and good clinical response.   CPAP and BIPAP Information CPAP and BIPAP use air pressure to keep your airways open and help you breathe well. CPAP and BIPAP use different amounts of pressure. Your health care provider will tell you whether CPAP or BIPAP would be best for you. CPAP stands for continuous positive airway pressure. With CPAP, the amount of pressure stays the same while you breathe in and out. BIPAP stands for bi-level positive airway pressure. With BIPAP, the amount of pressure will be higher when you breathe in and lower when you breathe out. This allows you to take bigger breaths. CPAP or BIPAP may be used in the hospital or at home. You may need to have a sleep study before your provider can order a device for you to use at home. What are the advantages? CPAP and BIPAP are most often used for obstructive sleep apnea to keep the airways from collapsing when the muscles relax during sleep. CPAP or BIPAP can be used if you have: Chronic obstructive pulmonary disease. Heart failure. Medical conditions that cause muscle weakness. Other problems that cause breathing to be shallow, weak, or difficult. What are the risks? Your provider will talk with you about risks. These may include: Sores on your nose or face caused from the mask, prongs, or nasal pillows. Dry or stuffy nose or nosebleeds. Feeling gassy or bloated. Sinus or lung infection if the equipment is not cleaned well. When should CPAP or BIPAP be used? In most cases, CPAP or BIPAP is used during sleep at night or whenever the main sleep time happens. It's also used during naps. People with some medical conditions may need to wear the mask when they're awake. Follow instructions from your provider about when to use your CPAP or BIPAP. What happens during CPAP or BIPAP?  Both CPAP and BIPAP use a small machine that uses electricity to create air pressure. A long  tube connects the device to a plastic mask. Air is blown through the mask into your nose or mouth. The amount of pressure that's used to blow the air can be adjusted. Your provider will set the pressure setting and help you find the best mask for you. Tips for using the mask There are different types and sizes of masks. If your mask does not fit well, talk with your provider about getting a different one. Some common types of masks include: Full face masks, which fit over the mouth and nose. Nasal masks, which fit over the nose. Nasal pillow or prong masks, which fit into the nostrils. The mask needs to be snug to your face, so some people feel trapped or closed in at first. If you feel this way, you may need to get used to the mask. Hold the mask loosely over your nose or mouth and then gradually put the the mask on more snugly. Slowly increase the amount of time you use the mask. If you have trouble with your mask not fitting well or leaking, talk with your provider. Do not stop using the mask. Tips for using the device Follow instructions from your provider about how to and how often to use the device. For home use, CPAP and BIPAP devices come from home health care companies. There are many different brands. Your health insurance company will help to decide which device you get. Keep the CPAP or BIPAP device and attachments clean. Ask your home health care company or  check the instruction book for cleaning instructions. Make sure the humidifier is filled with germ-free (sterile) water and is working correctly. This will help prevent a dry or stuffy nose or nosebleeds. A nasal saline mist or spray may keep your nose from getting dry and sore. Do not eat or drink while the CPAP or BIPAP device is on. Food or drinks could get pushed into your lungs by the pressure of the CPAP or BIPAP. Follow these instructions at home: Take over-the-counter and prescription medicines only as told by your  provider. Do not smoke, vape, or use nicotine or tobacco. Contact a health care provider if: You have redness or pressure sores on your head, face, mouth, or nose from the mask or headgear. You have trouble using the CPAP or BIPAP device. You have trouble going to sleep or staying asleep. Someone tells you that you snore even when wearing your CPAP or BIPAP device. Get help right away if: You have trouble breathing. You feel confused. These symptoms may be an emergency. Get help right away. Call 911. Do not wait to see if the symptoms will go away. Do not drive yourself to the hospital. This information is not intended to replace advice given to you by your health care provider. Make sure you discuss any questions you have with your health care provider. Document Revised: 06/11/2022 Document Reviewed: 06/11/2022 Elsevier Patient Education  2024 [employer].

## 2024-01-18 DIAGNOSIS — E1165 Type 2 diabetes mellitus with hyperglycemia: Secondary | ICD-10-CM | POA: Diagnosis not present

## 2024-01-18 DIAGNOSIS — H538 Other visual disturbances: Secondary | ICD-10-CM | POA: Diagnosis not present

## 2024-01-21 ENCOUNTER — Other Ambulatory Visit: Payer: Self-pay | Admitting: Internal Medicine

## 2024-01-21 DIAGNOSIS — K2100 Gastro-esophageal reflux disease with esophagitis, without bleeding: Secondary | ICD-10-CM

## 2024-01-21 MED ORDER — OMEPRAZOLE 20 MG PO CPDR: DELAYED_RELEASE_CAPSULE | ORAL | 3 refills | Status: AC

## 2024-01-21 NOTE — Telephone Encounter (Signed)
 Copied from CRM [id number]. Topic: Clinical - Medication Refill >> Jan 21, 2024 11:12 AM Robinson H wrote: Medication: omeprazole  (PRILOSEC) 20 MG capsule  Has the patient contacted their pharmacy? Yes, needs new prescription previous provider that wrote prescription passed patient seeing Rosina Senters (Agent: If no, request that the patient contact the pharmacy for the refill. If patient does not wish to contact the pharmacy document the reason why and proceed with request.) (Agent: If yes, when and what did the pharmacy advise?)  This is the patient's preferred pharmacy:  [email or web address] - [facility] - [place], [place] - [address] [place] [address] Phone: [phone] Fax: [phone]   Is this the correct pharmacy for this prescription? Yes If no, delete pharmacy and type the correct one.   Has the prescription been filled recently? No  Is the patient out of the medication? No  Has the patient been seen for an appointment in the last year OR does the patient have an upcoming appointment? Yes  Can we respond through MyChart? Yes  Agent: Please be advised that Rx refills may take up to 3 business days. We ask that you follow-up with your pharmacy.

## 2024-01-21 NOTE — Telephone Encounter (Signed)
 Refill request for  Omeprazole   LR HX provider LOV 09/17/23 FOV  02/01/24  Pleases review and advise.  Thanks.  Dm/cma

## 2024-02-01 ENCOUNTER — Ambulatory Visit: Admitting: Internal Medicine

## 2024-02-04 ENCOUNTER — Other Ambulatory Visit: Payer: Self-pay | Admitting: Internal Medicine

## 2024-02-13 LAB — LAB REPORT - SCANNED: EGFR: 65

## 2024-02-15 DIAGNOSIS — M25511 Pain in right shoulder: Secondary | ICD-10-CM | POA: Diagnosis not present

## 2024-02-15 DIAGNOSIS — E119 Type 2 diabetes mellitus without complications: Secondary | ICD-10-CM | POA: Diagnosis not present

## 2024-02-15 DIAGNOSIS — M65341 Trigger finger, right ring finger: Secondary | ICD-10-CM | POA: Diagnosis not present

## 2024-02-22 ENCOUNTER — Ambulatory Visit: Admitting: Internal Medicine

## 2024-02-22 ENCOUNTER — Encounter: Payer: Self-pay | Admitting: Internal Medicine

## 2024-02-22 VITALS — BP 124/80 | HR 70 | Temp 98.2°F | Ht 73.0 in | Wt 231.8 lb

## 2024-02-22 DIAGNOSIS — N182 Chronic kidney disease, stage 2 (mild): Secondary | ICD-10-CM | POA: Diagnosis not present

## 2024-02-22 DIAGNOSIS — N401 Enlarged prostate with lower urinary tract symptoms: Secondary | ICD-10-CM | POA: Diagnosis not present

## 2024-02-22 DIAGNOSIS — E785 Hyperlipidemia, unspecified: Secondary | ICD-10-CM

## 2024-02-22 DIAGNOSIS — E559 Vitamin D deficiency, unspecified: Secondary | ICD-10-CM | POA: Diagnosis not present

## 2024-02-22 DIAGNOSIS — E1169 Type 2 diabetes mellitus with other specified complication: Secondary | ICD-10-CM | POA: Diagnosis not present

## 2024-02-22 DIAGNOSIS — R351 Nocturia: Secondary | ICD-10-CM | POA: Diagnosis not present

## 2024-02-22 DIAGNOSIS — E139 Other specified diabetes mellitus without complications: Secondary | ICD-10-CM

## 2024-02-22 DIAGNOSIS — E1122 Type 2 diabetes mellitus with diabetic chronic kidney disease: Secondary | ICD-10-CM

## 2024-02-22 DIAGNOSIS — I10 Essential (primary) hypertension: Secondary | ICD-10-CM

## 2024-02-22 DIAGNOSIS — Z23 Encounter for immunization: Secondary | ICD-10-CM

## 2024-02-22 NOTE — Patient Instructions (Signed)
 Please upload your most recent lab work from work to mychart

## 2024-02-22 NOTE — Progress Notes (Signed)
 " [facility] PRIMARY [facility]ARE [facility] [address] [place] [address] Dept: [phone] Dept Fax: [phone]    Subjective:   Harry [name] 1955-10-18 02/22/2024  Chief Complaint  Patient presents with   Follow-up    Not fasting, shingrix  #2, lower back pain     HPI: COBIE MARCOUX presents today for re-assessment and management of chronic medical conditions.  Discussed the use of AI scribe software for clinical note transcription with the patient, who gave verbal consent to proceed.  History of Present Illness   TANMAY HALTEMAN is a 68 year old male who presents for a routine follow-up visit.  He has experienced significant improvement in shoulder pain following a cortisone injection, with pain now being approximately 95% better.  His diabetes is managed by endocrinology, with a recent A1c of 5.9%. He takes olmesartan  and hydrochlorothiazide  for blood pressure, which is stable at 124/80 mmHg, and atorvastatin  for cholesterol management. He also takes a vitamin D  supplement for deficiency.  He has a history of CKD stage two associated with diabetes. He experiences lower back pain primarily on the right side, radiating to the lower abdomen, persisting for at least a year. The pain is most pronounced when lying down and attempting to sit up, necessitating rolling over and sliding to get up. Abstaining from alcohol did not completely alleviate the pain. No hematuria is noted.  He underwent arterial ablation for prostate issues 8-10 months ago but continues to experience urinary symptoms, requiring tamsulosin  for relief. He has discontinued finasteride. He is uncertain about the procedure's effectiveness as he still struggles with urinary flow. He is going to call his urologist with [facility] to schedule appt.   He is due for his second shingles vaccine today. Last colonoscopy was 2023 with [facility] - requesting records.  He recently had a company  physical with comprehensive blood work last week. His PSA level checked earlier this year was normal. He did get his DM eye exam with [facility] - will request records.        The following portions of the patient's history were reviewed and updated as appropriate: past medical history, past surgical history, family history, social history, allergies, medications, and problem list.   Patient Active Problem List   Diagnosis Date Noted   Obesity, class 1 01/14/2024   Severe obstructive sleep apnea-hypopnea syndrome 12/29/2017   Family history of cerebrovascular disease 11/29/2017   LADA (latent autoimmune diabetes in adults), managed as type 2 (HCC) 11/12/2015   PVC's (premature ventricular contractions) 05/22/2014   Aneurysm, ascending aorta (HCC) 05/22/2014   BPH (benign prostatic hyperplasia) 04/06/2014   Erectile dysfunction associated with type 2 diabetes mellitus (HCC) 04/06/2014   CKD stage 2 due to type 2 diabetes mellitus (HCC) 02/17/2014   Obesity hypoventilation syndrome (HCC) 02/17/2014   Hyperlipidemia associated with type 2 diabetes mellitus (HCC)    Essential hypertension    Vitamin D  deficiency    GERD (gastroesophageal reflux disease)    Ventral hernia - periumbilical 2-3cm 02/04/2012   Morbid obesity (HCC) - BMI 30+ with OSA 02/04/2012   Past Medical History:  Diagnosis Date   Diabetes mellitus without complication (HCC) 1987   insulin  requiring   Elevated PSA 09/22/2018   [facility] is following    GERD (gastroesophageal reflux disease)    Hyperlipidemia    Hypertension    Hypokalemia    Hypomagnesemia    OSA on CPAP    10-12 years ago  Vitamin D  deficiency    Past Surgical History:  Procedure Laterality Date   ANAL FISSURE REPAIR  2007   IR 3D INDEPENDENT WKST  06/17/2023   IR ANGIOGRAM PELVIS SELECTIVE OR SUPRASELECTIVE  06/17/2023   IR ANGIOGRAM SELECTIVE EACH ADDITIONAL VESSEL  06/17/2023   IR ANGIOGRAM SELECTIVE EACH ADDITIONAL VESSEL   06/17/2023   IR EMBO TUMOR ORGAN ISCHEMIA INFARCT INC GUIDE ROADMAPPING  06/17/2023   IR RADIOLOGIST EVAL & MGMT  05/06/2023   IR RADIOLOGIST EVAL & MGMT  07/24/2023   IR US  GUIDE VASC ACCESS LEFT  06/17/2023   IR US  GUIDE VASC ACCESS RIGHT  06/17/2023   TONSILLECTOMY Bilateral    As a child   VENTRAL HERNIA REPAIR  04/13/12   Dr. Sheldon, ventral hernia repair w/ mesh   Family History  Problem Relation Age of Onset   Atrial fibrillation Mother    Kidney cancer Father 89       kidney   Cancer Paternal Uncle 63   Stroke Maternal Grandmother    Dementia Paternal Grandmother        Early 26s   Cancer Paternal Grandfather        Unsure of age or type   Sleep apnea Neg Hx    Current Medications[1] Allergies[2]   ROS: A complete ROS was performed with pertinent positives/negatives noted in the HPI. The remainder of the ROS are negative.    Objective:   Today's Vitals   02/22/24 0823  BP: 124/80  Pulse: 70  Temp: 98.2 F (36.8 C)  TempSrc: Temporal  SpO2: 99%  Weight: 231 lb 12.8 oz (105.1 kg)  Height: 6' 1 (1.854 m)    GENERAL: Well-appearing, in NAD. Well nourished.  SKIN: Pink, warm and dry. No rash, lesion, ulceration, or ecchymoses.  NECK: Trachea midline. Full ROM w/o pain or tenderness. No lymphadenopathy. No thyromegaly or palpable masses.  RESPIRATORY: Chest wall symmetrical. Respirations even and non-labored. Breath sounds clear to auscultation bilaterally.  CARDIAC: S1, S2 present, regular rate and rhythm. Peripheral pulses 2+ bilaterally.  GI: Abdomen soft, non-tender.  No CVA tenderness.  MSK: Muscle tone and strength appropriate for age.  EXTREMITIES: Without clubbing, cyanosis, or edema.  NEUROLOGIC: Steady, even gait.  PSYCH/MENTAL STATUS: Alert, oriented x 3. Cooperative, appropriate mood and affect.   Health Maintenance Due  Topic Date Due   OPHTHALMOLOGY EXAM  03/19/2023   Influenza Vaccine  10/02/2023   Zoster Vaccines- Shingrix  (2 of 2) 11/12/2023    Colonoscopy  12/22/2023    No results found for any visits on 02/22/24.  The 10-year ASCVD risk score (Arnett DK, et al., 2019) is: 25.9%     Assessment & Plan:  Assessment and Plan    Benign prostatic hyperplasia with lower urinary tract symptoms Persistent symptoms despite arterial ablation and tamsulosin . Possible kidney stones causing R. Lower back pain radiating to RLQ ongoing for > 1 year.  - Schedule appointment with urologist for further evaluation of urinary symptoms and potential kidney stones.  Essential hypertension Blood pressure well-controlled on olmesartan  and hydrochlorothiazide .  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 CKD stage 2 due to diabetes. Cholesterol levels optimal earlier this year. - Continue current diabetes management with endocrinology. - Continue to monitor kidney function regularly. - Check cholesterol levels at next follow-up visit. Patient not fasting today.  Vitamin D  deficiency Managed with vitamin D  supplementation. - Continue vitamin D  supplementation.  General health maintenance Due  for second shingles vaccine and flu shot. Previous vaccine well-tolerated. Colonoscopy and eye exam records pending. - Administered second shingles vaccine and flu shot today. - Obtain records from colonoscopy and eye exam.      Orders Placed This Encounter  Procedures   Flu Vaccine Trivalent High Dose (Fluad)   Varicella-zoster vaccine IM   No images are attached to the encounter or orders placed in the encounter. No orders of the defined types were placed in this encounter.   Return in about 6 months (around 08/22/2024) for Chronic Condition follow up.   Rosina Senters, FNP    [1]  Current Outpatient Medications:    atorvastatin  (LIPITOR) 40 MG tablet, Take 1 tablet (40 mg total) by mouth daily. for cholesterol, Disp: 90 tablet, Rfl: 3   BD INSULIN  SYRINGE U/F 31G X  5/16 0.5 ML MISC, INJECT INSULIN  TWICE A DAY AS DIRECTED, Disp: 200 each, Rfl: 3   Cholecalciferol (VITAMIN D3) 25 MCG (1000 UT) CAPS, Take 1 capsule (1,000 Units total) by mouth daily., Disp: , Rfl:    Continuous Glucose Sensor (DEXCOM G7 SENSOR) MISC, Use 1 sensor every 10 days, Disp: 9 each, Rfl: 3   Cyanocobalamin  (B-12) 2500 MCG SUBL, Place 2,500 mcg under the tongue daily., Disp: , Rfl:    glucose 4 GM chewable tablet, Chew 1 tablet (4 g total) by mouth as needed for low blood sugar., Disp: 30 tablet, Rfl: 12   glucose blood (FREESTYLE TEST STRIPS) test strip, Use 1- 2 times a day with the freestyle libre 14.  CGM glucometer, Disp: 150 each, Rfl: 3   HUMALOG  100 UNIT/ML injection, Use up to 80 units a day in the insulin  pump., Disp: 60 mL, Rfl: 2   insulin  glargine, 2 Unit Dial, (TOUJEO  MAX SOLOSTAR) 300 UNIT/ML Solostar Pen, Inject 44 Units into the skin daily., Disp: 18 mL, Rfl: 3   Magnesium 500 MG TABS, Take 500 mg by mouth daily., Disp: , Rfl:    metFORMIN  (GLUCOPHAGE -XR) 500 MG 24 hr tablet, Take 1 tablet by mouth twice daily with meals for diabetes., Disp: 180 tablet, Rfl: 2   MOUNJARO  10 MG/0.5ML Pen, Inject 10 mg under the skin once weekly., Disp: 6 mL, Rfl: 2   olmesartan -hydrochlorothiazide  (BENICAR  HCT) 40-12.5 MG tablet, Take 1/2 tablet by mouth twice daily. Discontinue 40-25 mg tablet., Disp: 90 tablet, Rfl: 2   omeprazole  (PRILOSEC) 20 MG capsule, TAKE 1 CAPSULE DAILY FOR INDIGESTION AND HEARTBURN, Disp: 90 capsule, Rfl: 3   potassium chloride  SA (KLOR-CON  M) 20 MEQ tablet, Take 1 tablet (20 mEq total) by mouth 2 (two) times daily., Disp: 180 tablet, Rfl: 3   sildenafil (VIAGRA) 25 MG tablet, Take 25 mg by mouth daily as needed for erectile dysfunction., Disp: , Rfl:    tamsulosin  (FLOMAX ) 0.4 MG CAPS capsule, Take 0.8 mg by mouth daily., Disp: , Rfl:    aspirin 81 MG tablet, Take 81 mg by mouth daily., Disp: , Rfl:   Current Facility-Administered Medications:     ipratropium-albuterol  (DUONEB) 0.5-2.5 (3) MG/3ML nebulizer solution 3 mL, 3 mL, Nebulization, Once, Cranford, Tonya, NP [2]  Allergies Allergen Reactions   Crestor [Rosuvastatin] Cough   Poison Ivy Treatment [Benzyl Alcohol-Camphor-Menthol]    "

## 2024-02-23 ENCOUNTER — Encounter: Payer: Self-pay | Admitting: Internal Medicine

## 2024-02-23 NOTE — Telephone Encounter (Signed)
 Angie - can you make sure patient's mychart attachment of his recent lab work gets scanned into his chart ? Thanks!

## 2024-02-24 NOTE — Telephone Encounter (Signed)
 Spoke with pt let him know we received his lab work.

## 2024-03-09 ENCOUNTER — Ambulatory Visit: Payer: Self-pay | Admitting: Internal Medicine

## 2024-05-05 ENCOUNTER — Ambulatory Visit: Admitting: Internal Medicine

## 2024-08-22 ENCOUNTER — Ambulatory Visit: Admitting: Internal Medicine

## 2025-01-16 ENCOUNTER — Ambulatory Visit: Admitting: Adult Health
